# Patient Record
Sex: Female | Born: 1955
Health system: Southern US, Community
[De-identification: ages and names within clinical notes are randomized; demographics above are authoritative.]

## PROBLEM LIST (undated history)

## (undated) DIAGNOSIS — K529 Noninfective gastroenteritis and colitis, unspecified: Secondary | ICD-10-CM

## (undated) DIAGNOSIS — R946 Abnormal results of thyroid function studies: Secondary | ICD-10-CM

## (undated) DIAGNOSIS — R51 Headache: Secondary | ICD-10-CM

## (undated) DIAGNOSIS — B1710 Acute hepatitis C without hepatic coma: Secondary | ICD-10-CM

## (undated) DIAGNOSIS — G5600 Carpal tunnel syndrome, unspecified upper limb: Secondary | ICD-10-CM

## (undated) DIAGNOSIS — J189 Pneumonia, unspecified organism: Secondary | ICD-10-CM

## (undated) DIAGNOSIS — B370 Candidal stomatitis: Secondary | ICD-10-CM

## (undated) DIAGNOSIS — J449 Chronic obstructive pulmonary disease, unspecified: Secondary | ICD-10-CM

## (undated) DIAGNOSIS — M1990 Unspecified osteoarthritis, unspecified site: Secondary | ICD-10-CM

## (undated) DIAGNOSIS — K219 Gastro-esophageal reflux disease without esophagitis: Secondary | ICD-10-CM

## (undated) DIAGNOSIS — D649 Anemia, unspecified: Secondary | ICD-10-CM

## (undated) DIAGNOSIS — M7150 Other bursitis, not elsewhere classified, unspecified site: Secondary | ICD-10-CM

## (undated) DIAGNOSIS — M719 Bursopathy, unspecified: Secondary | ICD-10-CM

## (undated) DIAGNOSIS — K759 Inflammatory liver disease, unspecified: Secondary | ICD-10-CM

## (undated) DIAGNOSIS — J4489 Other specified chronic obstructive pulmonary disease: Secondary | ICD-10-CM

## (undated) DIAGNOSIS — L270 Generalized skin eruption due to drugs and medicaments taken internally: Secondary | ICD-10-CM

## (undated) DIAGNOSIS — R519 Headache, unspecified: Secondary | ICD-10-CM

## (undated) DIAGNOSIS — E782 Mixed hyperlipidemia: Secondary | ICD-10-CM

## (undated) DIAGNOSIS — A4902 Methicillin resistant Staphylococcus aureus infection, unspecified site: Secondary | ICD-10-CM

## (undated) DIAGNOSIS — I2510 Atherosclerotic heart disease of native coronary artery without angina pectoris: Secondary | ICD-10-CM

## (undated) DIAGNOSIS — M67919 Unspecified disorder of synovium and tendon, unspecified shoulder: Secondary | ICD-10-CM

## (undated) DIAGNOSIS — R29818 Other symptoms and signs involving the nervous system: Secondary | ICD-10-CM

## (undated) DIAGNOSIS — H60399 Other infective otitis externa, unspecified ear: Secondary | ICD-10-CM

## (undated) DIAGNOSIS — R079 Chest pain, unspecified: Secondary | ICD-10-CM

## (undated) DIAGNOSIS — R569 Unspecified convulsions: Secondary | ICD-10-CM

## (undated) DIAGNOSIS — T847XXA Infection and inflammatory reaction due to other internal orthopedic prosthetic devices, implants and grafts, initial encounter: Secondary | ICD-10-CM

## (undated) DIAGNOSIS — B182 Chronic viral hepatitis C: Principal | ICD-10-CM

## (undated) HISTORY — DX: Atherosclerotic heart disease of native coronary artery without angina pectoris: I25.10

## (undated) HISTORY — DX: Generalized skin eruption due to drugs and medicaments taken internally: L27.0

## (undated) HISTORY — DX: Candidal stomatitis: B37.0

## (undated) HISTORY — DX: Noninfective gastroenteritis and colitis, unspecified: K52.9

## (undated) HISTORY — DX: Other symptoms and signs involving the nervous system: R29.818

## (undated) HISTORY — PX: CARDIAC CATHETERIZATION: SHX172

## (undated) HISTORY — DX: Mixed hyperlipidemia: E78.2

## (undated) HISTORY — DX: Unspecified disorder of synovium and tendon, unspecified shoulder: M71.9

## (undated) HISTORY — DX: Methicillin resistant Staphylococcus aureus infection, unspecified site: A49.02

## (undated) HISTORY — DX: Infection and inflammatory reaction due to other internal orthopedic prosthetic devices, implants and grafts, initial encounter: T84.7XXA

## (undated) HISTORY — DX: Carpal tunnel syndrome, unspecified upper limb: G56.00

## (undated) HISTORY — DX: Other infective otitis externa, unspecified ear: H60.399

## (undated) HISTORY — DX: Unspecified disorder of synovium and tendon, unspecified shoulder: M67.919

## (undated) HISTORY — DX: Chest pain, unspecified: R07.9

## (undated) HISTORY — PX: TONSILLECTOMY: SUR1361

## (undated) HISTORY — PX: COLONOSCOPY: SHX174

## (undated) HISTORY — DX: Anemia, unspecified: D64.9

## (undated) HISTORY — PX: CHOLECYSTECTOMY: SHX55

## (undated) HISTORY — DX: Other specified chronic obstructive pulmonary disease: J44.89

## (undated) HISTORY — DX: Chronic viral hepatitis C: B18.2

## (undated) HISTORY — DX: Abnormal results of thyroid function studies: R94.6

## (undated) HISTORY — DX: Chronic obstructive pulmonary disease, unspecified: J44.9

## (undated) HISTORY — DX: Acute hepatitis C without hepatic coma: B17.10

## (undated) HISTORY — DX: Other bursitis, not elsewhere classified, unspecified site: M71.50

## (undated) HISTORY — PX: ABDOMINAL HYSTERECTOMY: SUR658

---
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
## 1997-10-14 HISTORY — PX: ABDOMINAL HYSTERECTOMY: SUR658

## 1998-03-25 ENCOUNTER — Emergency Department (HOSPITAL_COMMUNITY): Admission: EM | Admit: 1998-03-25 | Discharge: 1998-03-25 | Payer: Self-pay

## 1998-08-17 ENCOUNTER — Ambulatory Visit (HOSPITAL_COMMUNITY): Admission: RE | Admit: 1998-08-17 | Discharge: 1998-08-17 | Payer: Self-pay | Admitting: Obstetrics and Gynecology

## 1998-08-31 ENCOUNTER — Ambulatory Visit (HOSPITAL_COMMUNITY): Admission: RE | Admit: 1998-08-31 | Discharge: 1998-08-31 | Payer: Self-pay | Admitting: Obstetrics and Gynecology

## 1998-10-14 HISTORY — PX: CHOLECYSTECTOMY: SHX55

## 1998-11-08 ENCOUNTER — Other Ambulatory Visit: Admission: RE | Admit: 1998-11-08 | Discharge: 1998-11-08 | Payer: Self-pay | Admitting: Obstetrics and Gynecology

## 1998-11-21 ENCOUNTER — Emergency Department (HOSPITAL_COMMUNITY): Admission: EM | Admit: 1998-11-21 | Discharge: 1998-11-21 | Payer: Self-pay

## 1998-12-15 ENCOUNTER — Emergency Department (HOSPITAL_COMMUNITY): Admission: EM | Admit: 1998-12-15 | Discharge: 1998-12-15 | Payer: Self-pay | Admitting: Emergency Medicine

## 1999-01-24 ENCOUNTER — Encounter: Payer: Self-pay | Admitting: Obstetrics and Gynecology

## 1999-01-24 ENCOUNTER — Encounter: Payer: Self-pay | Admitting: Urology

## 1999-01-24 ENCOUNTER — Inpatient Hospital Stay (HOSPITAL_COMMUNITY): Admission: RE | Admit: 1999-01-24 | Discharge: 1999-01-27 | Payer: Self-pay | Admitting: Obstetrics and Gynecology

## 1999-01-28 ENCOUNTER — Emergency Department (HOSPITAL_COMMUNITY): Admission: EM | Admit: 1999-01-28 | Discharge: 1999-01-28 | Payer: Self-pay | Admitting: Emergency Medicine

## 1999-02-13 ENCOUNTER — Encounter: Payer: Self-pay | Admitting: Emergency Medicine

## 1999-02-13 ENCOUNTER — Encounter: Payer: Self-pay | Admitting: Urology

## 1999-02-14 ENCOUNTER — Inpatient Hospital Stay (HOSPITAL_COMMUNITY): Admission: EM | Admit: 1999-02-14 | Discharge: 1999-02-17 | Payer: Self-pay | Admitting: Emergency Medicine

## 1999-02-14 ENCOUNTER — Encounter: Payer: Self-pay | Admitting: Urology

## 1999-07-07 ENCOUNTER — Emergency Department (HOSPITAL_COMMUNITY): Admission: EM | Admit: 1999-07-07 | Discharge: 1999-07-08 | Payer: Self-pay | Admitting: *Deleted

## 2000-09-26 ENCOUNTER — Inpatient Hospital Stay (HOSPITAL_COMMUNITY): Admission: AD | Admit: 2000-09-26 | Discharge: 2000-09-26 | Payer: Self-pay | Admitting: Obstetrics and Gynecology

## 2000-12-16 ENCOUNTER — Emergency Department (HOSPITAL_COMMUNITY): Admission: EM | Admit: 2000-12-16 | Discharge: 2000-12-16 | Payer: Self-pay | Admitting: Emergency Medicine

## 2001-05-12 ENCOUNTER — Emergency Department (HOSPITAL_COMMUNITY): Admission: EM | Admit: 2001-05-12 | Discharge: 2001-05-13 | Payer: Self-pay | Admitting: Emergency Medicine

## 2002-05-19 ENCOUNTER — Emergency Department (HOSPITAL_COMMUNITY): Admission: EM | Admit: 2002-05-19 | Discharge: 2002-05-19 | Payer: Self-pay | Admitting: Emergency Medicine

## 2002-05-19 ENCOUNTER — Encounter: Payer: Self-pay | Admitting: Emergency Medicine

## 2003-07-24 ENCOUNTER — Emergency Department (HOSPITAL_COMMUNITY): Admission: AD | Admit: 2003-07-24 | Discharge: 2003-07-24 | Payer: Self-pay | Admitting: Family Medicine

## 2003-08-04 ENCOUNTER — Encounter: Payer: Self-pay | Admitting: Obstetrics

## 2003-08-04 ENCOUNTER — Ambulatory Visit (HOSPITAL_COMMUNITY): Admission: RE | Admit: 2003-08-04 | Discharge: 2003-08-04 | Payer: Self-pay | Admitting: Obstetrics

## 2003-08-23 ENCOUNTER — Ambulatory Visit (HOSPITAL_COMMUNITY): Admission: RE | Admit: 2003-08-23 | Discharge: 2003-08-23 | Payer: Self-pay | Admitting: Obstetrics

## 2003-09-23 ENCOUNTER — Encounter: Admission: RE | Admit: 2003-09-23 | Discharge: 2003-11-16 | Payer: Self-pay | Admitting: Orthopedic Surgery

## 2003-09-30 ENCOUNTER — Emergency Department (HOSPITAL_COMMUNITY): Admission: AD | Admit: 2003-09-30 | Discharge: 2003-10-01 | Payer: Self-pay | Admitting: Family Medicine

## 2003-10-11 ENCOUNTER — Emergency Department (HOSPITAL_COMMUNITY): Admission: AD | Admit: 2003-10-11 | Discharge: 2003-10-11 | Payer: Self-pay | Admitting: Family Medicine

## 2003-10-18 ENCOUNTER — Emergency Department (HOSPITAL_COMMUNITY): Admission: AD | Admit: 2003-10-18 | Discharge: 2003-10-18 | Payer: Self-pay | Admitting: Family Medicine

## 2003-11-12 ENCOUNTER — Emergency Department (HOSPITAL_COMMUNITY): Admission: AD | Admit: 2003-11-12 | Discharge: 2003-11-12 | Payer: Self-pay | Admitting: Family Medicine

## 2003-11-30 ENCOUNTER — Emergency Department (HOSPITAL_COMMUNITY): Admission: AD | Admit: 2003-11-30 | Discharge: 2003-11-30 | Payer: Self-pay | Admitting: Family Medicine

## 2004-05-15 ENCOUNTER — Emergency Department (HOSPITAL_COMMUNITY): Admission: EM | Admit: 2004-05-15 | Discharge: 2004-05-15 | Payer: Self-pay | Admitting: Emergency Medicine

## 2004-07-11 ENCOUNTER — Emergency Department (HOSPITAL_COMMUNITY): Admission: EM | Admit: 2004-07-11 | Discharge: 2004-07-11 | Payer: Self-pay | Admitting: Emergency Medicine

## 2004-07-22 ENCOUNTER — Emergency Department (HOSPITAL_COMMUNITY): Admission: EM | Admit: 2004-07-22 | Discharge: 2004-07-22 | Payer: Self-pay | Admitting: Emergency Medicine

## 2004-07-31 ENCOUNTER — Ambulatory Visit (HOSPITAL_COMMUNITY): Admission: RE | Admit: 2004-07-31 | Discharge: 2004-08-01 | Payer: Self-pay | Admitting: Surgery

## 2004-07-31 ENCOUNTER — Encounter (INDEPENDENT_AMBULATORY_CARE_PROVIDER_SITE_OTHER): Payer: Self-pay | Admitting: Specialist

## 2004-11-26 ENCOUNTER — Emergency Department (HOSPITAL_COMMUNITY): Admission: EM | Admit: 2004-11-26 | Discharge: 2004-11-26 | Payer: Self-pay | Admitting: Family Medicine

## 2004-12-02 ENCOUNTER — Emergency Department (HOSPITAL_COMMUNITY): Admission: EM | Admit: 2004-12-02 | Discharge: 2004-12-03 | Payer: Self-pay | Admitting: Emergency Medicine

## 2005-02-28 ENCOUNTER — Emergency Department (HOSPITAL_COMMUNITY): Admission: EM | Admit: 2005-02-28 | Discharge: 2005-02-28 | Payer: Self-pay | Admitting: Family Medicine

## 2005-07-31 ENCOUNTER — Emergency Department (HOSPITAL_COMMUNITY): Admission: EM | Admit: 2005-07-31 | Discharge: 2005-07-31 | Payer: Self-pay | Admitting: Emergency Medicine

## 2005-08-22 ENCOUNTER — Emergency Department (HOSPITAL_COMMUNITY): Admission: EM | Admit: 2005-08-22 | Discharge: 2005-08-22 | Payer: Self-pay | Admitting: Family Medicine

## 2005-10-12 ENCOUNTER — Emergency Department (HOSPITAL_COMMUNITY): Admission: EM | Admit: 2005-10-12 | Discharge: 2005-10-12 | Payer: Self-pay | Admitting: Family Medicine

## 2005-12-04 ENCOUNTER — Ambulatory Visit: Payer: Self-pay | Admitting: Gastroenterology

## 2005-12-05 ENCOUNTER — Ambulatory Visit: Payer: Self-pay | Admitting: Gastroenterology

## 2005-12-09 ENCOUNTER — Ambulatory Visit (HOSPITAL_COMMUNITY): Admission: RE | Admit: 2005-12-09 | Discharge: 2005-12-09 | Payer: Self-pay | Admitting: Obstetrics

## 2005-12-17 ENCOUNTER — Encounter: Admission: RE | Admit: 2005-12-17 | Discharge: 2005-12-30 | Payer: Self-pay | Admitting: Obstetrics

## 2005-12-20 ENCOUNTER — Emergency Department (HOSPITAL_COMMUNITY): Admission: EM | Admit: 2005-12-20 | Discharge: 2005-12-20 | Payer: Self-pay | Admitting: Family Medicine

## 2005-12-24 ENCOUNTER — Emergency Department (HOSPITAL_COMMUNITY): Admission: EM | Admit: 2005-12-24 | Discharge: 2005-12-24 | Payer: Self-pay | Admitting: Emergency Medicine

## 2006-01-22 ENCOUNTER — Emergency Department (HOSPITAL_COMMUNITY): Admission: EM | Admit: 2006-01-22 | Discharge: 2006-01-22 | Payer: Self-pay | Admitting: Emergency Medicine

## 2006-01-28 ENCOUNTER — Ambulatory Visit: Payer: Self-pay | Admitting: Gastroenterology

## 2006-02-12 ENCOUNTER — Encounter: Payer: Self-pay | Admitting: Obstetrics and Gynecology

## 2007-03-02 ENCOUNTER — Emergency Department (HOSPITAL_COMMUNITY): Admission: EM | Admit: 2007-03-02 | Discharge: 2007-03-02 | Payer: Self-pay | Admitting: Family Medicine

## 2007-04-01 ENCOUNTER — Ambulatory Visit: Payer: Self-pay | Admitting: Internal Medicine

## 2007-04-10 ENCOUNTER — Ambulatory Visit: Payer: Self-pay | Admitting: Internal Medicine

## 2007-04-10 DIAGNOSIS — G5600 Carpal tunnel syndrome, unspecified upper limb: Secondary | ICD-10-CM | POA: Insufficient documentation

## 2007-05-23 ENCOUNTER — Emergency Department (HOSPITAL_COMMUNITY): Admission: EM | Admit: 2007-05-23 | Discharge: 2007-05-23 | Payer: Self-pay | Admitting: Emergency Medicine

## 2007-05-26 ENCOUNTER — Ambulatory Visit: Payer: Self-pay | Admitting: Internal Medicine

## 2007-07-13 DIAGNOSIS — R29818 Other symptoms and signs involving the nervous system: Secondary | ICD-10-CM | POA: Insufficient documentation

## 2007-07-13 DIAGNOSIS — M7150 Other bursitis, not elsewhere classified, unspecified site: Secondary | ICD-10-CM | POA: Insufficient documentation

## 2007-07-20 ENCOUNTER — Encounter (INDEPENDENT_AMBULATORY_CARE_PROVIDER_SITE_OTHER): Payer: Self-pay | Admitting: Internal Medicine

## 2007-08-18 ENCOUNTER — Telehealth (INDEPENDENT_AMBULATORY_CARE_PROVIDER_SITE_OTHER): Payer: Self-pay | Admitting: *Deleted

## 2007-08-27 ENCOUNTER — Ambulatory Visit: Payer: Self-pay | Admitting: Internal Medicine

## 2007-08-27 DIAGNOSIS — R079 Chest pain, unspecified: Secondary | ICD-10-CM

## 2007-08-27 DIAGNOSIS — H60399 Other infective otitis externa, unspecified ear: Secondary | ICD-10-CM | POA: Insufficient documentation

## 2007-08-27 DIAGNOSIS — M67919 Unspecified disorder of synovium and tendon, unspecified shoulder: Secondary | ICD-10-CM | POA: Insufficient documentation

## 2007-08-27 DIAGNOSIS — M719 Bursopathy, unspecified: Secondary | ICD-10-CM

## 2007-08-30 ENCOUNTER — Encounter (INDEPENDENT_AMBULATORY_CARE_PROVIDER_SITE_OTHER): Payer: Self-pay | Admitting: Internal Medicine

## 2007-08-30 DIAGNOSIS — E782 Mixed hyperlipidemia: Secondary | ICD-10-CM

## 2007-08-30 LAB — CONVERTED CEMR LAB
Cholesterol: 249 mg/dL — ABNORMAL HIGH (ref 0–200)
HDL: 37 mg/dL — ABNORMAL LOW (ref >39)
Triglycerides: 242 mg/dL — ABNORMAL HIGH (ref <150)

## 2007-09-23 ENCOUNTER — Ambulatory Visit (HOSPITAL_COMMUNITY): Admission: RE | Admit: 2007-09-23 | Discharge: 2007-09-23 | Payer: Self-pay | Admitting: Obstetrics

## 2007-09-29 ENCOUNTER — Emergency Department (HOSPITAL_COMMUNITY): Admission: EM | Admit: 2007-09-29 | Discharge: 2007-09-29 | Payer: Self-pay | Admitting: Emergency Medicine

## 2008-04-16 ENCOUNTER — Emergency Department (HOSPITAL_COMMUNITY): Admission: EM | Admit: 2008-04-16 | Discharge: 2008-04-16 | Payer: Self-pay | Admitting: Emergency Medicine

## 2008-06-10 ENCOUNTER — Emergency Department (HOSPITAL_COMMUNITY): Admission: EM | Admit: 2008-06-10 | Discharge: 2008-06-10 | Payer: Self-pay | Admitting: Family Medicine

## 2008-07-13 ENCOUNTER — Inpatient Hospital Stay (HOSPITAL_COMMUNITY): Admission: EM | Admit: 2008-07-13 | Discharge: 2008-07-14 | Payer: Self-pay | Admitting: Emergency Medicine

## 2008-07-13 ENCOUNTER — Encounter (INDEPENDENT_AMBULATORY_CARE_PROVIDER_SITE_OTHER): Payer: Self-pay | Admitting: *Deleted

## 2008-07-28 ENCOUNTER — Ambulatory Visit: Payer: Self-pay | Admitting: Internal Medicine

## 2008-07-28 DIAGNOSIS — J449 Chronic obstructive pulmonary disease, unspecified: Secondary | ICD-10-CM

## 2008-07-28 DIAGNOSIS — J441 Chronic obstructive pulmonary disease with (acute) exacerbation: Secondary | ICD-10-CM | POA: Insufficient documentation

## 2008-07-28 DIAGNOSIS — B1710 Acute hepatitis C without hepatic coma: Secondary | ICD-10-CM | POA: Insufficient documentation

## 2008-07-28 DIAGNOSIS — J4489 Other specified chronic obstructive pulmonary disease: Secondary | ICD-10-CM | POA: Insufficient documentation

## 2008-07-31 ENCOUNTER — Telehealth (INDEPENDENT_AMBULATORY_CARE_PROVIDER_SITE_OTHER): Payer: Self-pay | Admitting: Internal Medicine

## 2008-08-02 ENCOUNTER — Encounter (INDEPENDENT_AMBULATORY_CARE_PROVIDER_SITE_OTHER): Payer: Self-pay | Admitting: Internal Medicine

## 2008-08-04 ENCOUNTER — Ambulatory Visit: Payer: Self-pay | Admitting: *Deleted

## 2008-08-09 ENCOUNTER — Ambulatory Visit: Payer: Self-pay | Admitting: Internal Medicine

## 2008-08-22 ENCOUNTER — Emergency Department (HOSPITAL_COMMUNITY): Admission: EM | Admit: 2008-08-22 | Discharge: 2008-08-22 | Payer: Self-pay | Admitting: Emergency Medicine

## 2008-08-29 LAB — CONVERTED CEMR LAB
ALT: 31 units/L (ref 0–35)
Albumin: 4.6 g/dL (ref 3.5–5.2)
Cholesterol: 168 mg/dL (ref 0–200)
HDL: 56 mg/dL (ref >39)
Indirect Bilirubin: 0.5 mg/dL (ref 0.0–0.9)
Total CHOL/HDL Ratio: 3
Total Protein: 7.6 g/dL (ref 6.0–8.3)
Triglycerides: 91 mg/dL (ref <150)
VLDL: 18 mg/dL (ref 0–40)

## 2008-10-04 ENCOUNTER — Ambulatory Visit: Payer: Self-pay | Admitting: Family Medicine

## 2008-10-04 DIAGNOSIS — M25559 Pain in unspecified hip: Secondary | ICD-10-CM | POA: Insufficient documentation

## 2008-10-19 ENCOUNTER — Emergency Department (HOSPITAL_COMMUNITY): Admission: EM | Admit: 2008-10-19 | Discharge: 2008-10-19 | Payer: Self-pay | Admitting: Emergency Medicine

## 2008-10-21 ENCOUNTER — Inpatient Hospital Stay (HOSPITAL_COMMUNITY): Admission: EM | Admit: 2008-10-21 | Discharge: 2008-10-24 | Payer: Self-pay | Admitting: Emergency Medicine

## 2008-11-26 ENCOUNTER — Emergency Department (HOSPITAL_COMMUNITY): Admission: EM | Admit: 2008-11-26 | Discharge: 2008-11-26 | Payer: Self-pay | Admitting: Emergency Medicine

## 2008-11-27 ENCOUNTER — Emergency Department (HOSPITAL_COMMUNITY): Admission: EM | Admit: 2008-11-27 | Discharge: 2008-11-27 | Payer: Self-pay | Admitting: Emergency Medicine

## 2008-12-24 ENCOUNTER — Emergency Department (HOSPITAL_COMMUNITY): Admission: EM | Admit: 2008-12-24 | Discharge: 2008-12-24 | Payer: Self-pay | Admitting: Emergency Medicine

## 2009-01-02 ENCOUNTER — Telehealth (INDEPENDENT_AMBULATORY_CARE_PROVIDER_SITE_OTHER): Payer: Self-pay | Admitting: Internal Medicine

## 2009-01-04 ENCOUNTER — Ambulatory Visit: Payer: Self-pay | Admitting: Internal Medicine

## 2009-01-06 ENCOUNTER — Ambulatory Visit (HOSPITAL_COMMUNITY): Admission: RE | Admit: 2009-01-06 | Discharge: 2009-01-06 | Payer: Self-pay | Admitting: Internal Medicine

## 2009-01-12 ENCOUNTER — Ambulatory Visit: Payer: Self-pay | Admitting: Internal Medicine

## 2009-01-12 DIAGNOSIS — G454 Transient global amnesia: Secondary | ICD-10-CM | POA: Insufficient documentation

## 2009-01-15 ENCOUNTER — Emergency Department (HOSPITAL_COMMUNITY): Admission: EM | Admit: 2009-01-15 | Discharge: 2009-01-15 | Payer: Self-pay | Admitting: Emergency Medicine

## 2009-03-18 ENCOUNTER — Emergency Department (HOSPITAL_COMMUNITY): Admission: EM | Admit: 2009-03-18 | Discharge: 2009-03-18 | Payer: Self-pay | Admitting: Emergency Medicine

## 2009-03-21 ENCOUNTER — Emergency Department (HOSPITAL_COMMUNITY): Admission: EM | Admit: 2009-03-21 | Discharge: 2009-03-21 | Payer: Self-pay | Admitting: Emergency Medicine

## 2009-03-22 ENCOUNTER — Inpatient Hospital Stay (HOSPITAL_COMMUNITY): Admission: EM | Admit: 2009-03-22 | Discharge: 2009-03-25 | Payer: Self-pay | Admitting: Emergency Medicine

## 2009-06-20 ENCOUNTER — Emergency Department (HOSPITAL_COMMUNITY): Admission: EM | Admit: 2009-06-20 | Discharge: 2009-06-20 | Payer: Self-pay | Admitting: Emergency Medicine

## 2009-06-22 ENCOUNTER — Emergency Department (HOSPITAL_COMMUNITY): Admission: EM | Admit: 2009-06-22 | Discharge: 2009-06-22 | Payer: Self-pay | Admitting: Emergency Medicine

## 2009-07-06 ENCOUNTER — Emergency Department (HOSPITAL_COMMUNITY): Admission: EM | Admit: 2009-07-06 | Discharge: 2009-07-06 | Payer: Self-pay | Admitting: Emergency Medicine

## 2009-07-12 ENCOUNTER — Encounter (INDEPENDENT_AMBULATORY_CARE_PROVIDER_SITE_OTHER): Payer: Self-pay | Admitting: Internal Medicine

## 2009-07-15 ENCOUNTER — Inpatient Hospital Stay (HOSPITAL_COMMUNITY): Admission: EM | Admit: 2009-07-15 | Discharge: 2009-07-17 | Payer: Self-pay | Admitting: Emergency Medicine

## 2009-07-24 ENCOUNTER — Emergency Department (HOSPITAL_COMMUNITY): Admission: EM | Admit: 2009-07-24 | Discharge: 2009-07-24 | Payer: Self-pay | Admitting: Emergency Medicine

## 2009-08-02 ENCOUNTER — Encounter: Payer: Self-pay | Admitting: Physician Assistant

## 2009-08-10 ENCOUNTER — Ambulatory Visit: Payer: Self-pay | Admitting: Physician Assistant

## 2009-08-10 DIAGNOSIS — H6990 Unspecified Eustachian tube disorder, unspecified ear: Secondary | ICD-10-CM | POA: Insufficient documentation

## 2009-08-10 DIAGNOSIS — J019 Acute sinusitis, unspecified: Secondary | ICD-10-CM | POA: Insufficient documentation

## 2009-08-10 DIAGNOSIS — H6980 Other specified disorders of Eustachian tube, unspecified ear: Secondary | ICD-10-CM | POA: Insufficient documentation

## 2009-10-02 IMAGING — CR DG CHEST 2V
2 series · 2 of 2 positions shown · non-contrast
Comparison: 03/18/2009

CLINICAL DATA: None of the and cough, fever, pneumonia.

CHEST - 2 VIEW

[w chest pa]
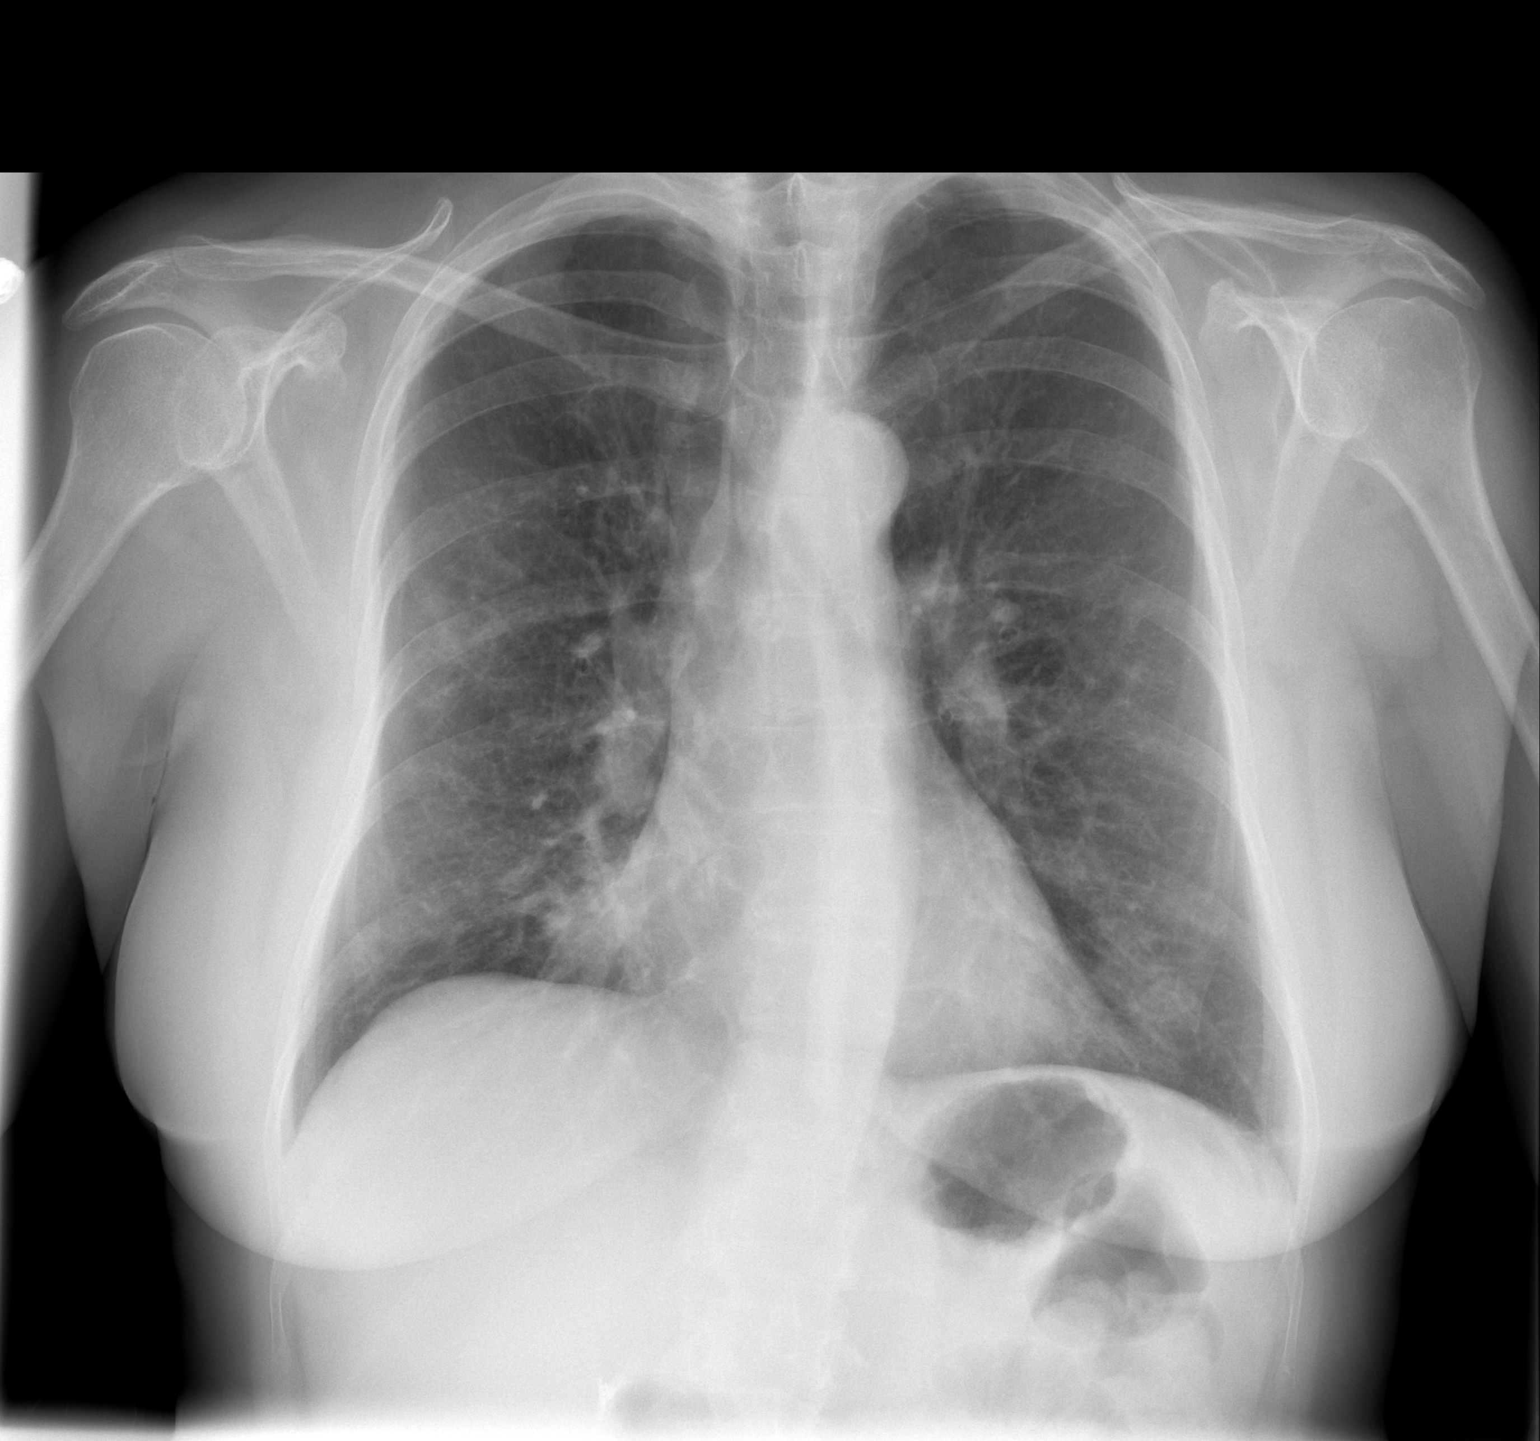

[w chest lat]
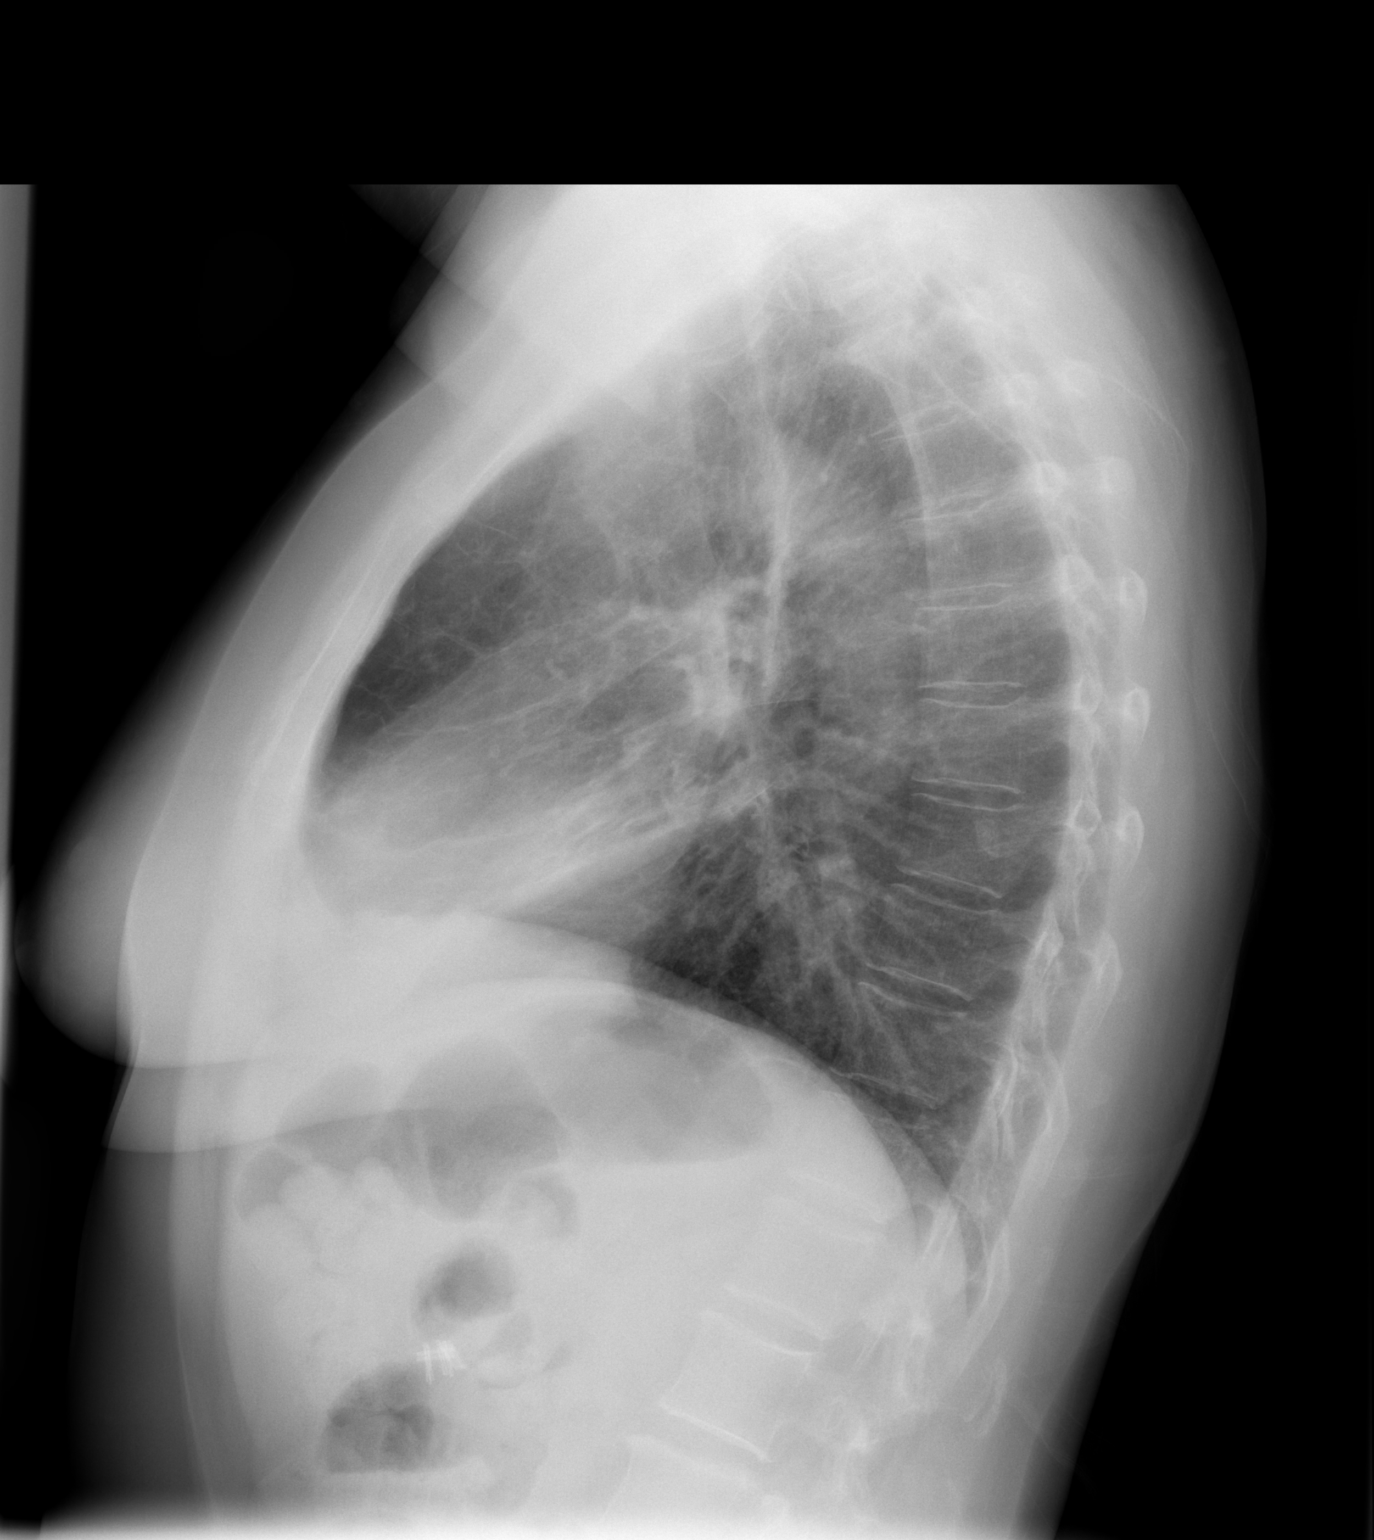

[2 of 2 positions shown; findings below may reference images not displayed]

FINDINGS: Patchy density peripherally in the right upper lobe, new
since prior study.  Question developing pneumonia.  Heart is normal
size.  No other focal opacities.  No effusions.  No acute bony
abnormality.
IMPRESSION: New patchy density in the peripheral right upper lobe.  Question
developing pneumonia.

## 2009-11-02 ENCOUNTER — Emergency Department (HOSPITAL_COMMUNITY): Admission: EM | Admit: 2009-11-02 | Discharge: 2009-11-02 | Payer: Self-pay | Admitting: Emergency Medicine

## 2009-11-13 ENCOUNTER — Emergency Department (HOSPITAL_COMMUNITY): Admission: EM | Admit: 2009-11-13 | Discharge: 2009-11-14 | Payer: Self-pay | Admitting: Emergency Medicine

## 2010-02-05 ENCOUNTER — Emergency Department (HOSPITAL_COMMUNITY): Admission: EM | Admit: 2010-02-05 | Discharge: 2010-02-05 | Payer: Self-pay | Admitting: Emergency Medicine

## 2010-06-14 ENCOUNTER — Encounter (HOSPITAL_COMMUNITY): Admission: RE | Admit: 2010-06-14 | Discharge: 2010-07-06 | Payer: Self-pay | Admitting: Cardiovascular Disease

## 2010-11-04 ENCOUNTER — Encounter: Payer: Self-pay | Admitting: Gastroenterology

## 2010-12-30 LAB — CBC
HCT: 41.9 % (ref 36.0–46.0)
Hemoglobin: 14.1 g/dL (ref 12.0–15.0)
MCHC: 33.6 g/dL (ref 30.0–36.0)
MCHC: 33.7 g/dL (ref 30.0–36.0)
MCV: 99.3 fL (ref 78.0–100.0)
RBC: 4.15 MIL/uL (ref 3.87–5.11)
RDW: 13.8 % (ref 11.5–15.5)
WBC: 8.4 10*3/uL (ref 4.0–10.5)

## 2010-12-30 LAB — POCT I-STAT, CHEM 8
Calcium, Ion: 1.1 mmol/L — ABNORMAL LOW (ref 1.12–1.32)
Chloride: 113 mEq/L — ABNORMAL HIGH (ref 96–112)
Glucose, Bld: 82 mg/dL (ref 70–99)
HCT: 42 % (ref 36.0–46.0)
HCT: 43 % (ref 36.0–46.0)
Potassium: 5 mEq/L (ref 3.5–5.1)
TCO2: 24 mmol/L (ref 0–100)

## 2010-12-30 LAB — POCT CARDIAC MARKERS
CKMB, poc: <1 ng/mL — ABNORMAL LOW (ref 1.0–8.0)
Troponin i, poc: <0.05 ng/mL (ref 0.00–0.09)

## 2010-12-30 LAB — CK TOTAL AND CKMB (NOT AT ARMC)
CK, MB: 0.7 ng/mL (ref 0.3–4.0)
Total CK: 11 U/L (ref 7–177)

## 2010-12-30 LAB — DIFFERENTIAL
Basophils Absolute: 0 10*3/uL (ref 0.0–0.1)
Basophils Absolute: 0.1 10*3/uL (ref 0.0–0.1)
Basophils Relative: 1 % (ref 0–1)
Basophils Relative: 1 % (ref 0–1)
Eosinophils Relative: 2 % (ref 0–5)
Monocytes Absolute: 0.5 10*3/uL (ref 0.1–1.0)
Monocytes Relative: 7 % (ref 3–12)
Neutro Abs: 5 10*3/uL (ref 1.7–7.7)
Neutrophils Relative %: 59 % (ref 43–77)

## 2011-01-01 LAB — CBC
MCHC: 34.2 g/dL (ref 30.0–36.0)
RBC: 4.43 MIL/uL (ref 3.87–5.11)
WBC: 8.9 10*3/uL (ref 4.0–10.5)

## 2011-01-01 LAB — DIFFERENTIAL
Basophils Relative: 1 % (ref 0–1)
Lymphs Abs: 2.5 10*3/uL (ref 0.7–4.0)
Monocytes Relative: 6 % (ref 3–12)
Neutro Abs: 5.7 10*3/uL (ref 1.7–7.7)
Neutrophils Relative %: 64 % (ref 43–77)

## 2011-01-01 LAB — POCT I-STAT, CHEM 8
Chloride: 110 mEq/L (ref 96–112)
Glucose, Bld: 95 mg/dL (ref 70–99)
HCT: 48 % — ABNORMAL HIGH (ref 36.0–46.0)
Potassium: 3.7 mEq/L (ref 3.5–5.1)
Sodium: 142 mEq/L (ref 135–145)

## 2011-01-01 LAB — POCT CARDIAC MARKERS
CKMB, poc: <1 ng/mL — ABNORMAL LOW (ref 1.0–8.0)
Myoglobin, poc: 38.4 ng/mL (ref 12–200)
Troponin i, poc: <0.05 ng/mL (ref 0.00–0.09)

## 2011-01-17 LAB — DIFFERENTIAL
Basophils Relative: 1 % (ref 0–1)
Eosinophils Absolute: 0.2 10*3/uL (ref 0.0–0.7)
Eosinophils Relative: 2 % (ref 0–5)
Neutrophils Relative %: 44 % (ref 43–77)

## 2011-01-17 LAB — COMPREHENSIVE METABOLIC PANEL
AST: 31 U/L (ref 0–37)
Albumin: 3.7 g/dL (ref 3.5–5.2)
Alkaline Phosphatase: 76 U/L (ref 39–117)
BUN: 11 mg/dL (ref 6–23)
CO2: 27 mEq/L (ref 19–32)
Chloride: 108 mEq/L (ref 96–112)
Creatinine, Ser: 0.92 mg/dL (ref 0.4–1.2)
GFR calc non Af Amer: >60 mL/min (ref >60)
Potassium: 5.3 mEq/L — ABNORMAL HIGH (ref 3.5–5.1)
Total Bilirubin: 0.4 mg/dL (ref 0.3–1.2)

## 2011-01-17 LAB — BASIC METABOLIC PANEL
BUN: 12 mg/dL (ref 6–23)
Calcium: 8.8 mg/dL (ref 8.4–10.5)
Creatinine, Ser: 0.83 mg/dL (ref 0.4–1.2)
GFR calc non Af Amer: >60 mL/min (ref >60)
Potassium: 4 mEq/L (ref 3.5–5.1)

## 2011-01-17 LAB — HEPARIN LEVEL (UNFRACTIONATED): Heparin Unfractionated: 0.59 IU/mL (ref 0.30–0.70)

## 2011-01-17 LAB — CARDIAC PANEL(CRET KIN+CKTOT+MB+TROPI)
CK, MB: 0.8 ng/mL (ref 0.3–4.0)
Relative Index: INVALID (ref 0.0–2.5)
Total CK: 87 U/L (ref 7–177)
Troponin I: 0.01 ng/mL (ref 0.00–0.06)

## 2011-01-17 LAB — LIPID PANEL
HDL: 46 mg/dL (ref >39)
Triglycerides: 108 mg/dL (ref <150)
VLDL: 22 mg/dL (ref 0–40)

## 2011-01-17 LAB — CBC
Platelets: 337 10*3/uL (ref 150–400)
RDW: 14.2 % (ref 11.5–15.5)
WBC: 8.4 10*3/uL (ref 4.0–10.5)

## 2011-01-17 LAB — TROPONIN I: Troponin I: 0.01 ng/mL (ref 0.00–0.06)

## 2011-01-17 LAB — CK TOTAL AND CKMB (NOT AT ARMC)
CK, MB: 1.3 ng/mL (ref 0.3–4.0)
Relative Index: 1 (ref 0.0–2.5)
Total CK: 124 U/L (ref 7–177)

## 2011-01-17 LAB — PROTIME-INR: INR: 0.9 (ref 0.00–1.49)

## 2011-01-17 LAB — POCT CARDIAC MARKERS

## 2011-01-17 LAB — APTT: aPTT: 25 seconds (ref 24–37)

## 2011-01-18 ENCOUNTER — Encounter (HOSPITAL_COMMUNITY): Payer: Self-pay | Admitting: Radiology

## 2011-01-18 ENCOUNTER — Emergency Department (HOSPITAL_COMMUNITY)
Admission: EM | Admit: 2011-01-18 | Discharge: 2011-01-18 | Disposition: A | Discharge status: Home / Self Care | Payer: Self-pay | Attending: Emergency Medicine | Admitting: Emergency Medicine

## 2011-01-18 ENCOUNTER — Emergency Department (HOSPITAL_COMMUNITY): Payer: Self-pay

## 2011-01-18 DIAGNOSIS — R071 Chest pain on breathing: Secondary | ICD-10-CM | POA: Insufficient documentation

## 2011-01-18 DIAGNOSIS — G8929 Other chronic pain: Secondary | ICD-10-CM | POA: Insufficient documentation

## 2011-01-18 DIAGNOSIS — K219 Gastro-esophageal reflux disease without esophagitis: Secondary | ICD-10-CM | POA: Insufficient documentation

## 2011-01-18 DIAGNOSIS — I10 Essential (primary) hypertension: Secondary | ICD-10-CM | POA: Insufficient documentation

## 2011-01-18 DIAGNOSIS — I2510 Atherosclerotic heart disease of native coronary artery without angina pectoris: Secondary | ICD-10-CM | POA: Insufficient documentation

## 2011-01-18 DIAGNOSIS — R112 Nausea with vomiting, unspecified: Secondary | ICD-10-CM | POA: Insufficient documentation

## 2011-01-18 DIAGNOSIS — Z8619 Personal history of other infectious and parasitic diseases: Secondary | ICD-10-CM | POA: Insufficient documentation

## 2011-01-18 DIAGNOSIS — Z79899 Other long term (current) drug therapy: Secondary | ICD-10-CM | POA: Insufficient documentation

## 2011-01-18 DIAGNOSIS — R1031 Right lower quadrant pain: Secondary | ICD-10-CM | POA: Insufficient documentation

## 2011-01-18 LAB — CBC
HCT: 46 % (ref 36.0–46.0)
Hemoglobin: 15.9 g/dL — ABNORMAL HIGH (ref 12.0–15.0)
MCV: 97.9 fL (ref 78.0–100.0)
RBC: 4.7 MIL/uL (ref 3.87–5.11)
RDW: 14.1 % (ref 11.5–15.5)
WBC: 9.6 10*3/uL (ref 4.0–10.5)

## 2011-01-18 LAB — URINALYSIS, ROUTINE W REFLEX MICROSCOPIC
Bilirubin Urine: NEGATIVE
Ketones, ur: NEGATIVE mg/dL
Nitrite: NEGATIVE
Protein, ur: NEGATIVE mg/dL
pH: 5.5 (ref 5.0–8.0)

## 2011-01-18 LAB — LIPASE, BLOOD: Lipase: 29 U/L (ref 11–59)

## 2011-01-18 LAB — COMPREHENSIVE METABOLIC PANEL
ALT: 25 U/L (ref 0–35)
Alkaline Phosphatase: 70 U/L (ref 39–117)
BUN: 13 mg/dL (ref 6–23)
CO2: 26 mEq/L (ref 19–32)
Chloride: 108 mEq/L (ref 96–112)
Glucose, Bld: 110 mg/dL — ABNORMAL HIGH (ref 70–99)
Potassium: 3.9 mEq/L (ref 3.5–5.1)
Sodium: 141 mEq/L (ref 135–145)
Total Bilirubin: 0.3 mg/dL (ref 0.3–1.2)
Total Protein: 7.5 g/dL (ref 6.0–8.3)

## 2011-01-18 LAB — DIFFERENTIAL
Basophils Absolute: 0 10*3/uL (ref 0.0–0.1)
Lymphocytes Relative: 30 % (ref 12–46)
Lymphs Abs: 2.9 10*3/uL (ref 0.7–4.0)
Neutro Abs: 6.1 10*3/uL (ref 1.7–7.7)

## 2011-01-18 LAB — URINE MICROSCOPIC-ADD ON

## 2011-01-18 MED ORDER — IOHEXOL 300 MG/ML  SOLN
100.0000 mL | Freq: Once | INTRAMUSCULAR | Status: AC | PRN
  Administered 2011-01-18: 100 mL via INTRAVENOUS

## 2011-01-21 LAB — COMPREHENSIVE METABOLIC PANEL
ALT: 48 U/L — ABNORMAL HIGH (ref 0–35)
AST: 39 U/L — ABNORMAL HIGH (ref 0–37)
Albumin: 2.9 g/dL — ABNORMAL LOW (ref 3.5–5.2)
Albumin: 3.2 g/dL — ABNORMAL LOW (ref 3.5–5.2)
Alkaline Phosphatase: 150 U/L — ABNORMAL HIGH (ref 39–117)
Alkaline Phosphatase: 200 U/L — ABNORMAL HIGH (ref 39–117)
BUN: 7 mg/dL (ref 6–23)
Calcium: 8.6 mg/dL (ref 8.4–10.5)
Calcium: 8.9 mg/dL (ref 8.4–10.5)
Creatinine, Ser: 0.78 mg/dL (ref 0.4–1.2)
GFR calc Af Amer: >60 mL/min (ref >60)
Potassium: 3.8 mEq/L (ref 3.5–5.1)
Potassium: 3.9 mEq/L (ref 3.5–5.1)
Sodium: 144 mEq/L (ref 135–145)
Total Protein: 6.4 g/dL (ref 6.0–8.3)
Total Protein: 7 g/dL (ref 6.0–8.3)

## 2011-01-21 LAB — HEPATIC FUNCTION PANEL
ALT: 34 U/L (ref 0–35)
Alkaline Phosphatase: 116 U/L (ref 39–117)
Bilirubin, Direct: 0.1 mg/dL (ref 0.0–0.3)
Indirect Bilirubin: 0.1 mg/dL — ABNORMAL LOW (ref 0.3–0.9)
Total Bilirubin: 0.2 mg/dL — ABNORMAL LOW (ref 0.3–1.2)

## 2011-01-21 LAB — DIFFERENTIAL
Lymphocytes Relative: 15 % (ref 12–46)
Lymphs Abs: 1.4 10*3/uL (ref 0.7–4.0)
Monocytes Absolute: 0.9 10*3/uL (ref 0.1–1.0)
Monocytes Relative: 10 % (ref 3–12)
Neutro Abs: 6.5 10*3/uL (ref 1.7–7.7)
Neutrophils Relative %: 72 % (ref 43–77)

## 2011-01-21 LAB — BLOOD GAS, ARTERIAL
Acid-base deficit: 2.5 mmol/L — ABNORMAL HIGH (ref 0.0–2.0)
Bicarbonate: 21.6 mEq/L (ref 20.0–24.0)
FIO2: 0.21 %
O2 Saturation: 95.9 %
pCO2 arterial: 35.6 mmHg (ref 35.0–45.0)
pO2, Arterial: 77.9 mmHg — ABNORMAL LOW (ref 80.0–100.0)

## 2011-01-21 LAB — CBC
HCT: 36.5 % (ref 36.0–46.0)
HCT: 41.6 % (ref 36.0–46.0)
Hemoglobin: 12.3 g/dL (ref 12.0–15.0)
MCHC: 33 g/dL (ref 30.0–36.0)
MCHC: 33.8 g/dL (ref 30.0–36.0)
Platelets: 332 10*3/uL (ref 150–400)
RBC: 3.77 MIL/uL — ABNORMAL LOW (ref 3.87–5.11)
RDW: 14.9 % (ref 11.5–15.5)
RDW: 15 % (ref 11.5–15.5)

## 2011-01-21 LAB — BASIC METABOLIC PANEL
CO2: 25 mEq/L (ref 19–32)
Calcium: 8.7 mg/dL (ref 8.4–10.5)
GFR calc Af Amer: >60 mL/min (ref >60)
Glucose, Bld: 145 mg/dL — ABNORMAL HIGH (ref 70–99)
Potassium: 4.2 mEq/L (ref 3.5–5.1)
Sodium: 142 mEq/L (ref 135–145)

## 2011-01-21 LAB — CULTURE, BLOOD (ROUTINE X 2)
Culture: NO GROWTH
Culture: NO GROWTH

## 2011-01-21 LAB — HCV RNA QUANT: HCV Quantitative: 1610000 IU/mL — ABNORMAL HIGH (ref <43)

## 2011-01-21 LAB — D-DIMER, QUANTITATIVE: D-Dimer, Quant: 0.27 ug/mL-FEU (ref 0.00–0.48)

## 2011-01-27 ENCOUNTER — Emergency Department (HOSPITAL_COMMUNITY)
Admission: EM | Admit: 2011-01-27 | Discharge: 2011-01-27 | Disposition: A | Discharge status: Home / Self Care | Payer: Self-pay | Attending: Emergency Medicine | Admitting: Emergency Medicine

## 2011-01-27 ENCOUNTER — Emergency Department (HOSPITAL_COMMUNITY): Payer: Self-pay

## 2011-01-27 DIAGNOSIS — Z8619 Personal history of other infectious and parasitic diseases: Secondary | ICD-10-CM | POA: Insufficient documentation

## 2011-01-27 DIAGNOSIS — Z79899 Other long term (current) drug therapy: Secondary | ICD-10-CM | POA: Insufficient documentation

## 2011-01-27 DIAGNOSIS — R197 Diarrhea, unspecified: Secondary | ICD-10-CM | POA: Insufficient documentation

## 2011-01-27 DIAGNOSIS — G8929 Other chronic pain: Secondary | ICD-10-CM | POA: Insufficient documentation

## 2011-01-27 DIAGNOSIS — R509 Fever, unspecified: Secondary | ICD-10-CM | POA: Insufficient documentation

## 2011-01-27 DIAGNOSIS — K219 Gastro-esophageal reflux disease without esophagitis: Secondary | ICD-10-CM | POA: Insufficient documentation

## 2011-01-27 DIAGNOSIS — I10 Essential (primary) hypertension: Secondary | ICD-10-CM | POA: Insufficient documentation

## 2011-01-27 DIAGNOSIS — R112 Nausea with vomiting, unspecified: Secondary | ICD-10-CM | POA: Insufficient documentation

## 2011-01-27 DIAGNOSIS — I2510 Atherosclerotic heart disease of native coronary artery without angina pectoris: Secondary | ICD-10-CM | POA: Insufficient documentation

## 2011-01-27 DIAGNOSIS — R1031 Right lower quadrant pain: Secondary | ICD-10-CM | POA: Insufficient documentation

## 2011-01-27 DIAGNOSIS — K5289 Other specified noninfective gastroenteritis and colitis: Secondary | ICD-10-CM | POA: Insufficient documentation

## 2011-01-27 LAB — URINALYSIS, ROUTINE W REFLEX MICROSCOPIC
Ketones, ur: 15 mg/dL — AB
Nitrite: NEGATIVE
Protein, ur: NEGATIVE mg/dL
Urobilinogen, UA: 1 mg/dL (ref 0.0–1.0)
pH: 5 (ref 5.0–8.0)

## 2011-01-27 LAB — CBC
HCT: 43.6 % (ref 36.0–46.0)
Hemoglobin: 14.6 g/dL (ref 12.0–15.0)
MCH: 32.7 pg (ref 26.0–34.0)
RBC: 4.47 MIL/uL (ref 3.87–5.11)

## 2011-01-27 LAB — DIFFERENTIAL
Basophils Relative: 1 % (ref 0–1)
Lymphocytes Relative: 45 % (ref 12–46)
Monocytes Relative: 7 % (ref 3–12)
Neutro Abs: 3.9 10*3/uL (ref 1.7–7.7)
Neutrophils Relative %: 46 % (ref 43–77)

## 2011-01-27 LAB — POCT I-STAT, CHEM 8
BUN: 30 mg/dL — ABNORMAL HIGH (ref 6–23)
Creatinine, Ser: 0.9 mg/dL (ref 0.4–1.2)
Potassium: 5 mEq/L (ref 3.5–5.1)
Sodium: 135 mEq/L (ref 135–145)
TCO2: 22 mmol/L (ref 0–100)

## 2011-01-27 LAB — URINE MICROSCOPIC-ADD ON

## 2011-01-27 MED ORDER — IOHEXOL 300 MG/ML  SOLN
100.0000 mL | Freq: Once | INTRAMUSCULAR | Status: AC | PRN
  Administered 2011-01-27: 100 mL via INTRAVENOUS

## 2011-01-28 LAB — TROPONIN I: Troponin I: 0.01 ng/mL (ref 0.00–0.06)

## 2011-01-28 LAB — CBC
Hemoglobin: 12.6 g/dL (ref 12.0–15.0)
MCHC: 33 g/dL (ref 30.0–36.0)
MCHC: 33.4 g/dL (ref 30.0–36.0)
MCV: 95.9 fL (ref 78.0–100.0)
RBC: 3.16 MIL/uL — ABNORMAL LOW (ref 3.87–5.11)
RBC: 4 MIL/uL (ref 3.87–5.11)
RDW: 13.8 % (ref 11.5–15.5)

## 2011-01-28 LAB — COMPREHENSIVE METABOLIC PANEL
ALT: 46 U/L — ABNORMAL HIGH (ref 0–35)
AST: 68 U/L — ABNORMAL HIGH (ref 0–37)
Albumin: 2.5 g/dL — ABNORMAL LOW (ref 3.5–5.2)
CO2: 26 mEq/L (ref 19–32)
Calcium: 7.9 mg/dL — ABNORMAL LOW (ref 8.4–10.5)
Calcium: 9.1 mg/dL (ref 8.4–10.5)
Creatinine, Ser: 0.71 mg/dL (ref 0.4–1.2)
GFR calc Af Amer: >60 mL/min (ref >60)
GFR calc non Af Amer: >60 mL/min (ref >60)
Glucose, Bld: 106 mg/dL — ABNORMAL HIGH (ref 70–99)
Sodium: 143 mEq/L (ref 135–145)
Total Protein: 5.3 g/dL — ABNORMAL LOW (ref 6.0–8.3)

## 2011-01-28 LAB — BASIC METABOLIC PANEL
CO2: 23 mEq/L (ref 19–32)
Calcium: 7.9 mg/dL — ABNORMAL LOW (ref 8.4–10.5)
Creatinine, Ser: 0.73 mg/dL (ref 0.4–1.2)
GFR calc Af Amer: >60 mL/min (ref >60)

## 2011-01-28 LAB — URINALYSIS, ROUTINE W REFLEX MICROSCOPIC
Glucose, UA: NEGATIVE mg/dL
Ketones, ur: NEGATIVE mg/dL
Protein, ur: NEGATIVE mg/dL

## 2011-01-28 LAB — CULTURE, BLOOD (ROUTINE X 2)
Culture: NO GROWTH
Culture: NO GROWTH

## 2011-01-28 LAB — DIFFERENTIAL
Eosinophils Absolute: 0.1 10*3/uL (ref 0.0–0.7)
Lymphocytes Relative: 16 % (ref 12–46)
Lymphs Abs: 2.6 10*3/uL (ref 0.7–4.0)
Neutro Abs: 12.4 10*3/uL — ABNORMAL HIGH (ref 1.7–7.7)
Neutrophils Relative %: 75 % (ref 43–77)

## 2011-01-28 LAB — CK TOTAL AND CKMB (NOT AT ARMC)
Relative Index: INVALID (ref 0.0–2.5)
Relative Index: INVALID (ref 0.0–2.5)
Total CK: 73 U/L (ref 7–177)
Total CK: 88 U/L (ref 7–177)

## 2011-01-28 LAB — CARDIAC PANEL(CRET KIN+CKTOT+MB+TROPI)
Relative Index: INVALID (ref 0.0–2.5)
Troponin I: <0.01 ng/mL (ref 0.00–0.06)

## 2011-01-28 LAB — MAGNESIUM: Magnesium: 2.2 mg/dL (ref 1.5–2.5)

## 2011-01-28 LAB — APTT: aPTT: 34 seconds (ref 24–37)

## 2011-02-21 ENCOUNTER — Ambulatory Visit (INDEPENDENT_AMBULATORY_CARE_PROVIDER_SITE_OTHER): Payer: Self-pay

## 2011-02-21 ENCOUNTER — Ambulatory Visit (HOSPITAL_COMMUNITY): Payer: Self-pay

## 2011-02-21 ENCOUNTER — Inpatient Hospital Stay (INDEPENDENT_AMBULATORY_CARE_PROVIDER_SITE_OTHER)
Admission: RE | Admit: 2011-02-21 | Discharge: 2011-02-21 | Disposition: A | Discharge status: Home / Self Care | Payer: Self-pay | Source: Ambulatory Visit | Attending: Family Medicine | Admitting: Family Medicine

## 2011-02-21 DIAGNOSIS — S93609A Unspecified sprain of unspecified foot, initial encounter: Secondary | ICD-10-CM

## 2011-02-26 NOTE — Discharge Summary (Signed)
NAMESADY, MONACO                ACCOUNT NO.:  [id number]   MEDICAL RECORD NO.:  [id number]          PATIENT TYPE:  INP   LOCATION:  4732                         FACILITY:  [facility]   PHYSICIAN:  Monte Fantasia, MD  DATE OF BIRTH:  05/05/1956   DATE OF ADMISSION:  03/22/2009  DATE OF DISCHARGE:  03/25/2009                               DISCHARGE SUMMARY   ADDENDUM   Please refer to the prior discharge summary dictated on March 24, 2009.  The patient has a primary care physician.  The patient has an  appointment with [facility] with Dr. Fannie Knee Drinkard on March 29, 2009, at  11 a.m.   DISCHARGE DIAGNOSIS:  There has been no change in the discharge  diagnosis.   DISCHARGE MEDICATIONS:  1. Tessalon Perles 200 mg p.o. t.i.d.  2. Avelox 400 mg p.o. daily for 3 more days.  3. Guaifenesin 600 mg p.o. b.i.d.  4. Topamax 25 mg p.o. nightly for 5 days.  5. Wellbutrin 150 mg p.o. b.i.d.  6. Aspirin 81 mg p.o. daily.  7. Oxycodone 5 mg p.o. q.6 h. p.r.n. pain.  8. Prednisone 40 mg p.o. daily to taper by 10 mg every day to      discontinue.   HOSPITAL COURSE:  Please refer to the prior discharge summary dictation.  There has been no change in the prior discharge summary.  The patient at  present is medically stable to be discharged and can be discharged home.   LABORATORY DATA:  Labs done today, liver function test, total bilirubin  0.2, direct bilirubin 0.1, indirect 0.1, alkaline phosphatase 116, AST  25, ALT 34, total protein 5.7, and albumin 2.6.   DISPOSITION:  The patient at present is medically stable to be  discharged.  The patient has been aggressively counseled to stop smoking  and also to follow up with her primary care physician with [facility].      Monte Fantasia, MD  Electronically Signed     MP/MEDQ  D:  03/25/2009  T:  03/26/2009  Job:  [id number]

## 2011-02-26 NOTE — Discharge Summary (Signed)
[name], [name]                ACCOUNT NO.:  [id number]   MEDICAL RECORD NO.:  [id number]          PATIENT TYPE:  INP   LOCATION:  4732                         FACILITY:  [facility]   PHYSICIAN:  Monte Fantasia, MD  DATE OF BIRTH:  10-Jan-1956   DATE OF ADMISSION:  03/22/2009  DATE OF DISCHARGE:                               DISCHARGE SUMMARY   PRIMARY CARE PHYSICIAN:  Unassigned.  The patient follows up at  [facility], however, wants change of primary care physician.   DISCHARGE DIAGNOSES:  1. Right upper lobe pneumonia.  2. Chronic obstructive pulmonary disease exacerbation.  3. Tobacco abuse.  4. Hepatitis C.  5. Anxiety disorder.   DISCHARGE MEDICATIONS:  1. Tessalon 200 mg p.o. t.i.d.  2. Avelox 400 mg p.o. daily for 3 days.  3. Guaifenesin 600 mg p.o. b.i.d.  4. Topamax 25 mg p.o. nightly for 5 days.  5. Wellbutrin 150 mg p.o. b.i.d.  6. Aspirin 81 mg p.o. daily.   ALLERGIES:  CODEINE, ZITHROMAX, ROCEPHIN which apparently causes rashes.  As per the patient also is allergic to TRAMADOL as it worsens her  headaches.   COURSE DURING THE HOSPITAL STAY:  [name] is a 55 year old lady  patient who came in, was admitted with increasing shortness of breath,  and on admission, a chest x-ray showed right middle lobe pneumonia,  community acquired, hence the patient was started on Avelox.  Blood  cultures have been done, which have been no growth to date.  The patient  has a significant history of smoking; however, the patient in view of  history of smoking has been counseled to quit smoking.  Also during the  stay in the hospital, the patient was noted to have significant  expiratory wheezing and hence was started on IV steroids.  The patient  improved well with the medications.  At present, we would discontinue  the IV steroids and would start her on p.o. and would complete the  course of Avelox for a total of 7 days.  The patient has been  aggressively counseled for  stopping smoking.   The patient has been complaining of headaches; however, as per the  patient states that the headaches are controlled only with morphine.  The patient could not be given NSAIDs as the patient's liver enzymes on  admission were mildly elevated, hence the patient was started on  Topamax.  If the patient improves symptomatically, we would give Topamax  for 5 days for her headache.  More likely the chronic headaches are  secondary to depression.  The patient needs an outpatient followup with  psychiatrist for her depression.  At present, the patient does not have  any suicidal ideations or mood variations.  The patient's depression is  well controlled with Wellbutrin.   Elevated liver functions:  The patient had elevated liver functions on  the day of admission.  Zocor has been on hold and in view of the same.  The patient does have history of hepatitis C, also may be related to  NSAID use.  At present, the liver functions have been trending down  well.  Right upper quadrant ultrasound showed no evidence of liver  pathology.  We would monitor the same as an outpatient.   RADIOLOGICAL INVESTIGATIONS DONE DURING THE STAY IN THE HOSPITAL:  1. Chest x-ray done on March 22, 2009.  Impression:  New patchy density in peripheral right upper lobe,  questionable developing pneumonia.  1. Chest x-ray done on March 23, 2009.  Impression:  Stable mild patchy pneumonia in the right upper lobe,  relatively chronic right middle lobe atelectasis.  1. Right upper quadrant sonogram.  Impression:  Prior cholecystectomy.  Liver size and echotexture normal.  No focal abnormalities.  Pancreas, no focal abnormalities.  Spleen,  normal size and echotexture. No focal abnormalities.  Both the kidneys  normal size and echotexture.  No evidence of hydronephrosis.   LABORATORY DATA DONE PRIOR TO DISCHARGE:  Total WBC 7.8, hemoglobin  12.3, hematocrit 36.5, platelets of 298.  Sodium 144, potassium  3.8,  chloride 112, bicarb 22, glucose 158, BUN 9, creatinine 0.73.  Total  bilirubin 0.2, alkaline phosphatase 150, AST 39, ALT 48, total protein  6.4, albumin 2.9, calcium of 8.6.  Blood cultures x2 sets have been  negative.  No growth to date.   DISPOSITION:  The patient at present has recovered well with her COPD  exacerbation and pneumonia and would be transitioned to p.o. prednisone  steroids in a.m., and the patient would be medically stable to be  discharged in next 24 hours.  The patient needs to have a primary care  physician as an outpatient and has been informed regarding the same and  has agreed to do so.  Tobacco cessation counseling has been done.   TOTAL TIME FOR DISCHARGE:  40 minutes with 20-minute counseling.      Monte Fantasia, MD  Electronically Signed     MP/MEDQ  D:  03/24/2009  T:  03/25/2009  Job:  [phone]

## 2011-02-26 NOTE — H&P (Signed)
[name], [name]                ACCOUNT NO.:  [id number]   MEDICAL RECORD NO.:  [id number]          PATIENT TYPE:  INP   LOCATION:  3730                         FACILITY:  [facility]   PHYSICIAN:  Lucita Ferrara, MD         DATE OF BIRTH:  1956-06-21   DATE OF ADMISSION:  07/12/2008  DATE OF DISCHARGE:                              HISTORY & PHYSICAL   PRIMARY CARE DOCTOR:  Unassigned.   HISTORY OF PRESENT ILLNESS:  The patient is a 55 year old female with  chief complaint of chest pain located in the left anterior precordial  area, going on for a last 2 hours prior to arrival, but actually started  2 days ago and gradually and possibly getting worse.  Constant in  nature.  No alleviating worsening factors.  Not associated with cough,  food, pleuritis, fevers, chills.  However, she does say that when she  takes a deep breath it does get worse, and also upon movement and  exertion it is worse.  She has got some relative risk factors including  smoking.  She has no family history of premature or her coronary artery  disease.  Never undergone catheterization or cardiac evaluation in the  past.   PAST MEDICAL HISTORY:  1. Anxiety.  2. Looks like she has got multiple admissions for multiple medical      issues including chronic cough, falls, muscle ache and muscle      strain and unclear etiology, weakness and fatigue.   PAST SURGICAL HISTORY:  Status post laparoscopic cholecystectomy dated  July 31, 2004.  She has also documented history of hepatitis C and  anxiety as above.   ALLERGIES:  CODEINE.   MEDICATIONS:  She denies taking any medication.   REVIEW OF SYSTEMS:  As per HPI, otherwise negative.   PHYSICAL EXAMINATION:  GENERAL:  Generally speaking, patient is in no  acute distress.  VITAL SIGNS:  Blood pressure is 118/75, pulse 64, respirations 14,  temperature 98.1, pulse ox 98% on 2 liters nasal cannula.  HEENT:  Normocephalic, atraumatic.  Sclerae is anicteric.  PERRLA.   Extraocular  muscles intact.  NECK:  Supple.  No JVD.  No carotid bruits.  CARDIOVASCULAR:  S1, S2, regular rate and rhythm.  No murmurs, rubs,  clicks.  LUNGS: Clear to auscultation bilaterally.  No rhonchi, rales or wheezes.  ABDOMEN:  Soft, nontender, nondistended.  Positive bowel sounds.  NEUROLOGICAL:  Patient is alert, oriented x3.  Cranial nerves II-XII  grossly intact.  Strength 5/5 bilaterally upper and lower extremities.  Chest wall shows no point tenderness.   EMERGENCY ROOM COURSE:  The patient was given sublingual nitroglycerin  with no relief of her chest pain.  She was also given IV Dilaudid with  some relief.  Her EKG did show inverted T-waves in the inferior leads in  2, 3, AVF.  In discussion with the emergency room physician, he felt  that there was no need for acute Cardiology intervention given the  negative enzymes.  Her D-dimer is positive at 0.7.  Her CBC is white  count 12.2,  hemoglobin 13.8, hematocrit 41.8, platelet count 290.  First  set of cardiac markers troponin less than 0.05.  I-stat shows a  hemoglobin of 14.3, hematocrit of 42.  Sodium, potassium chloride,  glucose, BUN and creatinine within normal limits.  CT angiography showed  no pulmonary embolism, mild to moderate intra and extrahepatic biliary  duct dilatation status post cholecystectomy.  There is also left upper  lobe paraseptal emphysema.  In regards to intra- and extrahepatic  biliary dilatation, there is mild to moderate intra- and extrahepatic  biliary dilatation, unchanged from 2006.  It was felt that while this  degree of biliary ductal dilatation is unusual status post  cholecystectomy, it appears to be a chronic finding in this patient.  She has no complete metabolic panel.   ASSESSMENT/PLAN:  A 55 year old with;  1. Atypical chest pain.  2. Inferior EKG changes persistent on two EKGs here with negative      enzymes, however.  3. Positive tobacco abuse.  4. Positive D-dimer.   Negative CT angio for a pulmonary embolism.  5. Mild leukocytosis.   DISCUSSION AND PAN:  Will go ahead and admit the patient to medical  telemetry unit.  Will initiate ACS protocol.  Will cycle enzymes x3  every 8 hours.  Aspirin, beta blocker, nitroglycerin and Dilaudid for  pain.  Smoking cessation will proceed.  The patient will likely need a  stress test versus Cardiology evaluation prior to discharge given focal  inferior changes on her EKG.  Given that she has mild leukocytosis, will  need to get a urinalysis.  The patient has not however, had any fevers  or chills.  Her chest x-ray essentially does not show any focal  infiltration.  However, her CT angio did show some emphysematous changes  to be related to smoking.  Also, the patient has some biliary duct  dilatation on her CT angiography that could be secondary to normal  findings per Radiology.  Yet, given that she is having this chest  discomfort, must rule out biliary tract disease or CBD thickening.  Will  correlates with LFTs, lipase, amylase, alk phos and the rest of the  plans really depend on her progress.      Lucita Ferrara, MD  Electronically Signed     RR/MEDQ  D:  07/13/2008  T:  07/13/2008  Job:  [id number]

## 2011-02-26 NOTE — H&P (Signed)
NAMEDEB, LOUDIN                ACCOUNT NO.:  [id number]   MEDICAL RECORD NO.:  [id number]          PATIENT TYPE:  INP   LOCATION:  1827                         FACILITY:  [facility]   PHYSICIAN:  Lucita Ferrara, MD         DATE OF BIRTH:  April 04, 1956   DATE OF ADMISSION:  10/20/2008  DATE OF DISCHARGE:                              HISTORY & PHYSICAL   CHIEF COMPLAINT:  Dyspnea and shortness of breath.   This is a 55 year old female who presents with dyspnea.  Symptoms have  been ongoing now for 2-3 days.  The patient presented to the emergency  room with similar complaints yesterday and was sent home per report.  The patient has a history of known tobacco abuse, known chronic cough.  She had subjective fevers at home and here in the emergency room she was  found to be febrile at 100.9.  She denies any urinary frequency or  urgency.  She denies abdominal pain.   PAST MEDICAL HISTORY:  1. History of mild coronary artery disease.  2. Chronic tobacco abuse.  3. Anxiety disorder.  4. Hepatitis C.   PAST SURGICAL HISTORY:  1. Status post coronary artery bypass graft.  2. She is status post laparoscopic cholecystectomy in 2005.   SOCIAL HISTORY:  She is a current smoker.  She denies drugs or alcohol.   ALLERGIES:  CODEINE.   MEDICATIONS AT HOME:  1. Wellbutrin.  2. Aspirin.  3. Zithromax.   REVIEW OF SYSTEMS:  As per HPI, otherwise negative.   PHYSICAL EXAMINATION:  GENERAL:  The patient is in no acute distress.  VITAL SIGNS:  Blood pressure 109/65, pulse 105, respirations 20, T-max  100.9.  HEENT:  Normocephalic, atraumatic.  Sclerae anicteric.  PERRLA.  Extraocular muscles intact.  NECK:  Supple.  No JVD, no carotid bruits.  CARDIOVASCULAR:  S1-S2.  Regular rate and rhythm.  No murmurs, rubs or  clicks.  LUNGS:  Clear to auscultation bilaterally.  No rhonchi, rales or  wheezes.  ABDOMEN:  Soft, nontender, nondistended.  Positive bowel sounds.  EXTREMITIES:  No clubbing,  cyanosis or edema.   Chest x-ray shows a persistent right middle lobe infiltrate, mild volume  loss and peribronchial thickening.   LABORATORY DATA:  Complete metabolic panel, sodium, potassium,  electrolytes, AST, ALT, alk phos and albumin within normal limits.  CBC  shows leukocytosis of 16.9.  Hemoglobin/hematocrit within normal limits.  Platelet count mildly high at 423.  Neutrophils absolute 12.4.   ASSESSMENT:  1. The patient is a 55 year old female smoker who presents with a      right middle lobe pneumonia.  2. Tobacco abuse.  3. Documented history of coronary artery disease.   DISCUSSION AND PLAN:  The patient will be admitted to the medical  telemetry unit.  Oxygen 2 liters nasal cannula, nebulizer treatments  with albuterol and Atrovent.  The patient will be initiated on  intravenous antibiotics with Zithromax and Rocephin for community-  acquired pneumonia.  Blood cultures, UA, urine culture.  Tobacco  cessation literature and a nicotine patch will also be offered.  Will  continue aspirin and Wellbutrin per home dose.  Medications will be  confirmed.  Given her coronary artery disease, I will go ahead and cycle  her cardiac enzymes x3 every 8 hours and will put an EKG to the chart.  The rest of the plans will depend on her progress.      Lucita Ferrara, MD  Electronically Signed     RR/MEDQ  D:  10/20/2008  T:  10/21/2008  Job:  [id number]

## 2011-02-26 NOTE — H&P (Signed)
[name], [name]                ACCOUNT NO.:  [id number]   MEDICAL RECORD NO.:  [id number]          PATIENT TYPE:  INP   LOCATION:  1832                         FACILITY:  [facility]   PHYSICIAN:  Lonia Blood, M.D.DATE OF BIRTH:  [birth year]   DATE OF ADMISSION:  03/22/2009  DATE OF DISCHARGE:                              HISTORY & PHYSICAL   PRIMARY CARE PHYSICIAN:  Unassigned.   CHIEF COMPLAINT:  Shortness of breath.   PRESENT ILLNESS:  [name] is a 55 year old female with a  history of multiple admissions for multiple different issues.  She was  most recently admitted to the hospital in January of 2010 with what  proved to be a right middle lobe community-acquired pneumonia.  Since  her discharge she resumed smoking.  She does state that she quit smoking  approximately 5 days ago.  Nonetheless she reports with a 1-week history  of progressive severe shortness of breath.  She has been coughing  frequently.  There has been thick sputum production.  The patient has  had subjective fevers and chills.  She has noted expiratory wheezing.  Her symptoms have progressively worsened over the last 7 days.  Last  night she could not sleep because she had such difficulty with shortness  of breath.  She has had no chest pain.  There has been no nausea or  vomiting, diarrhea, constipation.  There has been no weight loss or gain  that she has appreciated.   REVIEW OF SYSTEMS:  A comprehensive review of systems is unremarkable  with exception of the positive elements noted in the history of present  illness above.   PAST MEDICAL HISTORY:  1. Right middle lobe community-acquired pneumonia January 2010.  2. Tobacco abuse - discontinued approximately 3 days ago.  3. Mild nonobstructive coronary disease.      a.     Status post cath October 2009.      b.     30% stenosis proximal RCA and 30% stenosis LAD.  4. Ongoing tobacco abuse - claims recently quit.  5. Hepatitis C.  6.  Anxiety disorder.  7. Status post laparoscopic cholecystectomy October 2005.   OUTPATIENT MEDICATIONS:  1. Wellbutrin 150 mg b.i.d.  2. Aspirin 81 mg daily.  3. Zocor 40 mg nightly.   ALLERGIES:  CODEINE, ZITHROMAX AND ROCEPHIN WHICH APPARENTLY CAUSES  RASHES.   FAMILY HISTORY:  Noncontributory this admission.   SOCIAL HISTORY:  The patient does not drink alcohol.  She does not use  drugs.  She lives with her daughter in [place].   DATA REVIEWED:  White count is 9.0, platelet count is normal, hemoglobin  is normal, MCV is normal.  Sodium, potassium, chloride, bicarb, BUN,  creatinine are normal.  Serum glucose is normal.  AST is 149, ALT is 70,  alk phos is 200 with normal total bili.  Albumin is 3.2.  D-dimer is  negative.  Point of care markers are negative.  Chest x-ray reveals  patchy density in the right upper lobe, concerning for a possible early  pneumonia.   PHYSICAL EXAMINATION:  Temperature  101.6, blood pressure 125/79, heart  rate 113, respiratory rate 22, O2 saturation 93% on 6 liters per minute.  She I will come back to th  GENERAL:  Well-developed, well-nourished female in mild to moderate  respiratory distress.  HEENT:  Normocephalic, atraumatic.  Pupils equal, round, react to light  and accommodation.  Extraocular musclesintact bilaterally.  OC OP clear.  NECK:  No JVD.  No lymphadenopathy.  LUNGS:  Mild expiratory wheeze which does end before the end of  expiration with no severe prolonged expiratory phase at the present time  with no focal crackles.   DICTATION ENDED HERE.      Lonia Blood, M.D.  Electronically Signed     JTM/MEDQ  D:  03/22/2009  T:  03/22/2009  Job:  [id number]

## 2011-02-26 NOTE — Discharge Summary (Signed)
NAMEBREALYN, BARIL                ACCOUNT NO.:  [id number]   MEDICAL RECORD NO.:  [id number]           PATIENT TYPE:   LOCATION:                                 FACILITY:   PHYSICIAN:  Monte Fantasia, MD       DATE OF BIRTH:   DATE OF ADMISSION:  DATE OF DISCHARGE:                               DISCHARGE SUMMARY   ADDENDUM  The patient at present is symptomatically better and is stable to be  discharged.  The patient can be discharged home.  Clinical status is  improving.  No change sin the discharge summary dictated prior on  October 23, 2008.  The patient can be discharged.  Plan to discharge  home today.      Monte Fantasia, MD  Electronically Signed     MP/MEDQ  D:  10/24/2008  T:  10/24/2008  Job:  [id number]

## 2011-02-26 NOTE — Discharge Summary (Signed)
NAMECHRISTABELL, LOSEKE                ACCOUNT NO.:  [id number]   MEDICAL RECORD NO.:  [id number]          PATIENT TYPE:  INP   LOCATION:  3730                         FACILITY:  [facility]   PHYSICIAN:  Lonia Blood, M.D.       DATE OF BIRTH:  03-10-1956   DATE OF ADMISSION:  07/12/2008  DATE OF DISCHARGE:  07/14/2008                               DISCHARGE SUMMARY   DISCHARGE DIAGNOSES:  1. Mild coronary artery disease.  2. Tobacco abuse.  3. Anxiety disorder.   DISCHARGE MEDICATIONS:  1. Aspirin 81 mg daily.  2. Zocor 40 mg at bedtime.  3. Tylenol as needed for pain.  4. Nicotine patch 40 mg daily.   CONSULTATION DURING THIS ADMISSION:  The patient was seen in  consultation by Dr. Algie Coffer.   PROCEDURES DURING THIS ADMISSION:  1. July 13, 2008, the patient underwent a transthoracic      echocardiogram, findings of normal ejection fraction, mild LVH.  2. July 14, 2008, left heart catheterization, findings of a proximal      RCA of 30% stenosis and LAD 30% stenosis.   HISTORY AND PHYSICAL:  Refer to dictated H&P done by Dr. Flonnie Overman.   CONDITION ON DISCHARGE:  Ms. Varnadore is discharged in good condition.  She  will follow up with [facility] and Dr. Algie Coffer.   HOSPITAL COURSE:  Ms. Duffey is a 55 year old woman with multiple vague  complaints was admitted via the emergency room with some chest pain.  She did have EKG changes of negative T-waves in II, III and aVF raising  concern of unstable angina.  The patient was taken to the Cardiac  Catheterization Lab and had coronary angiogram with the presence of mild  nonobstructive coronary artery disease on the LAD and RCA.  Ms. Swetz  was discharged  home with aspirin, statin and close followup with Dr. Algie Coffer and with  her primary care physician at [facility].  At the time  of discharge, the patient's right groin was in stable condition without  hematoma or bleeding.      Lonia Blood, M.D.  Electronically Signed     SL/MEDQ  D:  07/14/2008  T:  07/15/2008  Job:  [id number]   cc:   Blinda Leatherwood, M.D.

## 2011-02-26 NOTE — H&P (Signed)
[name], DERN                ACCOUNT NO.:  [id number]   MEDICAL RECORD NO.:  [id number]          PATIENT TYPE:  INP   LOCATION:  1832                         FACILITY:  [facility]   PHYSICIAN:  Lonia Blood, M.D.DATE OF BIRTH:  Mar 13, 1956   DATE OF ADMISSION:  03/22/2009  DATE OF DISCHARGE:                              HISTORY & PHYSICAL   ADDENDUM:  Please note this is a continuation/addendum to the previously  dictated initial portion of the patient's history and physical.  The  dictation system simply cut me off inexplicably as I approached the  latter portion of my previous dictation.   PHYSICAL EXAM:  CARDIOVASCULAR - Tachycardic but regular without gallop  or rub with no appreciable murmur.  ABDOMEN - Overweight, soft bowel sounds present.  No organomegaly,  rebound or ascites.  EXTREMITIES:  There is no significant cyanosis, clubbing, edema  bilateral lower extremities.  NEUROLOGIC - Alert and oriented x4.  Cranial II-XII intact bilaterally  and 5/5 strength bilateral upper and lower extremities.  Intact  sensation to touch throughout.   IMPRESSION AND PLAN:  1. Right upper lobe community-acquired pneumonia - I have explained to      the patient that her recurrent episodes of pulmonary infection are      likely due to her ongoing tobacco abuse.  I have advised her to      completely avoid tobacco products of all kinds.  The patient will      be admitted to the acute units.  She will be gently hydrated.  She      will be placed on Avelox given her recent questionable allergic      reaction to Rocephin and azithromycin.  We will add a symptomatic      control with Tessalon Perles and Robitussin and Humibid.  2. Mild acute chronic obstructive pulmonary disease exacerbation - The      patient indeed displaying a significant agree of expiratory wheeze.      At this time the patient is not in severe distress but given her      concomitant infection, I am concerned that  this could rapidly      progress.  We will place the patient on scheduled nebs.  It would      be wise at the time of discharge to transition the patient to a      long-term inhaler such as Advair for better outpatient control.      Again I have counseled the patient as to the link between this      disease process and her smoking and advised her to continue in her      full abstinence from tobacco products.  3. Hepatitis C - The extent of the patient's previous evaluation or      consideration for treatment is not clear at this time.  We will      need to follow this up during the patient's hospital stay.  We will      recheck LFTs in the morning with gentle hydration.  I will check  coags as well.  4. Tobacco abuse - as noted above.  The patient has been counseled      extensively during this interaction to assure that she understands      that she must discontinue tobacco abuse.  I will request tobacco      cessation consultation during the patient's hospital stay.  5. Anxiety disorder - we will continue the patient's usual outpatient      Wellbutrin.  6. Hyperlipidemia - we will continue the patient's Zocor.  If the      patient's LFTs trend upward, we may need to consider holding this      medication.     Lonia Blood, M.D.  Electronically Signed    JTM/MEDQ  D:  03/22/2009  T:  03/22/2009  Job:  [id number]

## 2011-02-26 NOTE — Discharge Summary (Signed)
[name], YANKEE                ACCOUNT NO.:  [id number]   MEDICAL RECORD NO.:  [id number]          PATIENT TYPE:  INP   LOCATION:  3002                         FACILITY:  [facility]   PHYSICIAN:  Monte Fantasia, MD  DATE OF BIRTH:  1956-06-07   DATE OF ADMISSION:  10/20/2008  DATE OF DISCHARGE:                               DISCHARGE SUMMARY   PRIMARY CARE PHYSICIAN:  Unassigned.   DISCHARGE MEDICATIONS:  1. Avelox 500 mg p.o. q.12 h. for 7 days.  2. Aspirin 81 mg p.o. daily.  3. Guaifenesin 600 mg p.o. b.i.d.  4. Albuterol 2 puffs inhalations q.6 h. p.r.n. breathlessness.  5. Protonix 40 mg p.o. q.12 h.   DISCHARGE DIAGNOSES:  1. Right middle lobe pneumonia.  2. Tobacco abuse.  3. History of coronary artery disease.   COURSE DURING THE HOSPITAL STAY:  The patient is a 55 year old Caucasian  lady.  The patient was admitted on October 21, 2008, with complaints of  shortness of breath and dyspnea.  The patient was having subjective  fevers at home, found to be febrile of 100.9 in the emergency.  The  patient was initially started with Rocephin and Zithromax, however, had  a skin rash and the antibiotics were discontinued and changed to Avelox.  The patient was hypoxic on admission, however, has improved well with  the IV antibiotics and IV fluids.  Blood cultures since admission have  been no growth to date and leukocytosis has resolved.  The patient is  stable to be discharged and can be discharged home today.   LABORATORIES UPON DISCHARGE:  Total WBC 7.2, hemoglobin 10.2, hematocrit  30.6, and platelet count 311.  Sodium 136, potassium 3.7, chloride 107,  bicarb 23, glucose 97, BUN 2, and creatinine 0.73.  Cardiac enzymes x3  has been negative.  TSH 0.0789.  UA has been negative.  Blood cultures  x2 sets on October 21, 2008, has been no growth to date.   PHYSICAL EXAMINATION:  VITAL SIGNS:  Today, temperature of 97.6; pulse  of 70; respirations 18; blood pressure 100/61,  previous have been 120  and 124/74; and oxygen saturation is 94% on room air.  HEENT:  Neck is supple.  Pupils are equal and reacting to light.  No  pallor or no lymphadenopathy.  RESPIRATORY:  Air entry is bilaterally equal.  Right lower base  crackles.  ABDOMEN:  Soft.  No guarding or rigidity.  No distention.  No  tenderness.  EXTREMITIES:  No edema feet.   DISPOSITION:  The patient is stable to be discharged home on p.o.  medications.  He is recommended to follow up with primary care physician  as an outpatient.      Monte Fantasia, MD  Electronically Signed     MP/MEDQ  D:  10/23/2008  T:  10/23/2008  Job:  [id number]

## 2011-03-01 NOTE — Op Note (Signed)
[name], [name]                ACCOUNT NO.:  [id number]   MEDICAL RECORD NO.:  [id number]          PATIENT TYPE:  OIB   LOCATION:  2899                         FACILITY:  [facility]   PHYSICIAN:  Velora Heckler, MD      DATE OF BIRTH:  07/18/1956   DATE OF PROCEDURE:  07/31/2004  DATE OF DISCHARGE:                                 OPERATIVE REPORT   PREOPERATIVE DIAGNOSIS:  Symptomatic cholelithiasis.   POSTOPERATIVE DIAGNOSIS:  Symptomatic cholelithiasis.   PROCEDURE:  Laparoscopic cholecystectomy with intraoperative  cholangiography.   SURGEON:  Velora Heckler, M.D.   ANESTHESIA:  General anesthesia.   ESTIMATED BLOOD LOSS:  Minimal.   PREPARATION:  Betadine.   COMPLICATIONS:  None.   INDICATIONS FOR PROCEDURE:  The patient is a 55 year old white female with  several episodes of intermittent epigastric abdominal pain radiating to the  right upper quadrant.  The patient has had an ultrasound demonstrating  multiple gallstones.  Laboratory studies have been within normal limits.  The patient now comes to surgery for cholecystectomy.   DESCRIPTION OF PROCEDURE:  The procedure was done in OR #16 at the [facility].  The patient was brought to the operating room and  placed in the supine position on the operating room table.  Following  administration of general anesthesia, the patient was prepped and draped in  the usual strict aseptic fashion.  After ascertaining that an adequate level  of anesthesia had been obtained, an infraumbilical incision was made with  #15 blade.  Dissection was carried down to the fascia.  The fascia was  incised in the midline and the peritoneal cavity is entered cautiously.  A 0  Vicryl pursestring suture was placed in the fascia.  Hasson cannula was  introduced and the abdomen was insufflated with carbon dioxide.  Laparoscope  was introduced and the abdomen was explored.  Operative ports were placed  along the right costal  margin in the midline, mid clavicular line and  anterior axillary line.  Fundus of the gallbladder was grasped and retracted  cephalad.  Dissection was begun at the neck of the gallbladder.  Peritoneum  was incised.  Neck of the gallbladder and proximal cystic duct was dissected  out.  Clip was placed at the neck of the gallbladder.  Cystic duct was  incised with the Endoshears.  Cook cholangiography catheter was introduced  through a stab wound in the right upper quadrant.  It was inserted into the  cystic duct and secured with a Ligaclip.  Using C-arm fluoroscopy, real time  cholangiography was performed.  There was rapid filling of a long cystic  duct into a normal caliber common bile duct.  There was free flow of  contrast distally into the duodenum without filling defect or obstruction.  There was reflux of contrast into the right and left hepatic ductal systems.  Clips was withdrawn and the [facility] catheter was removed from the peritoneal  cavity.  The cystic duct was triply clipped and divided.  The cystic artery  was doubly clipped and divided.  Posterior  branch of cystic artery was  dissected out, doubly clipped, and divided. Gallbladder was then excised  from the gallbladder bed using a hook electrocautery for hemostasis.  Gallbladder was placed into an EndoCatch bag and extracted through the  umbilical port without difficulty.  Right upper quadrant is irrigated with  warm saline.  Good hemostasis is noted in the gallbladder bed.  Fluid is  evacuated.  Ports are removed under direct vision and good hemostasis was  noted at the port sites.  Pneumoperitoneum was released.  A 0 Vicryl  pursestring suture was tied securely.  Port sites were anesthetized with  local anesthetic.  All wounds were closed with interrupted 4-0 Vicryl  subcuticular sutures.  All wounds were washed and dried and Benzoin and  Steri-Strips applied.  Sterile dressings were applied.  The patient was  awakened from  anesthesia and brought to the recovery room in stable  condition.  The patient tolerated the procedure well.      Todd   TMG/MEDQ  D:  07/31/2004  T:  07/31/2004  Job:  [id number]

## 2011-06-15 ENCOUNTER — Emergency Department (HOSPITAL_COMMUNITY)
Admission: EM | Admit: 2011-06-15 | Discharge: 2011-06-16 | Disposition: A | Discharge status: Home / Self Care | Payer: Self-pay | Attending: Emergency Medicine | Admitting: Emergency Medicine

## 2011-06-15 DIAGNOSIS — M545 Low back pain, unspecified: Secondary | ICD-10-CM | POA: Insufficient documentation

## 2011-06-15 DIAGNOSIS — R5381 Other malaise: Secondary | ICD-10-CM | POA: Insufficient documentation

## 2011-06-15 DIAGNOSIS — I2510 Atherosclerotic heart disease of native coronary artery without angina pectoris: Secondary | ICD-10-CM | POA: Insufficient documentation

## 2011-06-15 DIAGNOSIS — K219 Gastro-esophageal reflux disease without esophagitis: Secondary | ICD-10-CM | POA: Insufficient documentation

## 2011-06-15 DIAGNOSIS — B9789 Other viral agents as the cause of diseases classified elsewhere: Secondary | ICD-10-CM | POA: Insufficient documentation

## 2011-06-15 DIAGNOSIS — M546 Pain in thoracic spine: Secondary | ICD-10-CM | POA: Insufficient documentation

## 2011-06-15 DIAGNOSIS — R51 Headache: Secondary | ICD-10-CM | POA: Insufficient documentation

## 2011-06-15 DIAGNOSIS — IMO0001 Reserved for inherently not codable concepts without codable children: Secondary | ICD-10-CM | POA: Insufficient documentation

## 2011-06-15 DIAGNOSIS — R1110 Vomiting, unspecified: Secondary | ICD-10-CM | POA: Insufficient documentation

## 2011-06-15 DIAGNOSIS — M542 Cervicalgia: Secondary | ICD-10-CM | POA: Insufficient documentation

## 2011-06-15 DIAGNOSIS — I10 Essential (primary) hypertension: Secondary | ICD-10-CM | POA: Insufficient documentation

## 2011-06-15 DIAGNOSIS — R10819 Abdominal tenderness, unspecified site: Secondary | ICD-10-CM | POA: Insufficient documentation

## 2011-06-15 DIAGNOSIS — F411 Generalized anxiety disorder: Secondary | ICD-10-CM | POA: Insufficient documentation

## 2011-06-15 DIAGNOSIS — H6690 Otitis media, unspecified, unspecified ear: Secondary | ICD-10-CM | POA: Insufficient documentation

## 2011-06-15 DIAGNOSIS — R197 Diarrhea, unspecified: Secondary | ICD-10-CM | POA: Insufficient documentation

## 2011-06-15 DIAGNOSIS — Z8619 Personal history of other infectious and parasitic diseases: Secondary | ICD-10-CM | POA: Insufficient documentation

## 2011-06-15 DIAGNOSIS — Z79899 Other long term (current) drug therapy: Secondary | ICD-10-CM | POA: Insufficient documentation

## 2011-06-15 DIAGNOSIS — M2550 Pain in unspecified joint: Secondary | ICD-10-CM | POA: Insufficient documentation

## 2011-06-15 LAB — CBC
MCV: 97 fL (ref 78.0–100.0)
Platelets: 426 10*3/uL — ABNORMAL HIGH (ref 150–400)
RBC: 3.94 MIL/uL (ref 3.87–5.11)
WBC: 8.1 10*3/uL (ref 4.0–10.5)

## 2011-06-15 LAB — URINALYSIS, ROUTINE W REFLEX MICROSCOPIC
Glucose, UA: NEGATIVE mg/dL
Leukocytes, UA: NEGATIVE
Nitrite: NEGATIVE
pH: 7 (ref 5.0–8.0)

## 2011-06-15 LAB — BASIC METABOLIC PANEL
CO2: 26 mEq/L (ref 19–32)
Chloride: 103 mEq/L (ref 96–112)
Potassium: 4 mEq/L (ref 3.5–5.1)
Sodium: 138 mEq/L (ref 135–145)

## 2011-06-15 LAB — HEPATIC FUNCTION PANEL
AST: 19 U/L (ref 0–37)
Albumin: 3.3 g/dL — ABNORMAL LOW (ref 3.5–5.2)
Total Protein: 6 g/dL (ref 6.0–8.3)

## 2011-06-15 LAB — POCT I-STAT TROPONIN I: Troponin i, poc: 0 ng/mL (ref 0.00–0.08)

## 2011-07-11 LAB — DIFFERENTIAL
Basophils Relative: 1
Eosinophils Absolute: 0.2
Monocytes Absolute: 0.6
Monocytes Relative: 7
Neutro Abs: 5

## 2011-07-11 LAB — CBC
Hemoglobin: 13.9
MCHC: 33.7
MCV: 95.7
RBC: 4.3

## 2011-07-11 LAB — POCT I-STAT, CHEM 8
Calcium, Ion: 1.09 — ABNORMAL LOW
Chloride: 105
Glucose, Bld: 99
HCT: 43

## 2011-07-15 LAB — COMPREHENSIVE METABOLIC PANEL
ALT: 30
Alkaline Phosphatase: 94
BUN: 12
CO2: 23
Chloride: 106
GFR calc non Af Amer: >60
Glucose, Bld: 91
Potassium: 3.6
Total Bilirubin: 0.6

## 2011-07-15 LAB — TSH: TSH: 5.131 — ABNORMAL HIGH

## 2011-07-15 LAB — POCT CARDIAC MARKERS: Troponin i, poc: <0.05

## 2011-07-15 LAB — CBC
HCT: 41.3
MCHC: 33.5
Platelets: 290
RDW: 13.6

## 2011-07-15 LAB — POCT I-STAT, CHEM 8
Calcium, Ion: 1.13
Glucose, Bld: 103 — ABNORMAL HIGH
HCT: 42
Hemoglobin: 14.3
Potassium: 4
TCO2: 24

## 2011-07-15 LAB — PROTIME-INR
INR: 1
Prothrombin Time: 13

## 2011-07-15 LAB — TROPONIN I: Troponin I: <0.01

## 2011-07-15 LAB — LIPASE, BLOOD: Lipase: 23

## 2011-07-15 LAB — CARDIAC PANEL(CRET KIN+CKTOT+MB+TROPI)
CK, MB: 1.6
Total CK: 122

## 2011-07-15 LAB — APTT: aPTT: 27

## 2011-07-15 LAB — CALCIUM: Calcium: 8.5

## 2011-07-15 LAB — B-NATRIURETIC PEPTIDE (CONVERTED LAB): Pro B Natriuretic peptide (BNP): 81

## 2011-07-16 LAB — DIFFERENTIAL
Basophils Absolute: 0.1
Basophils Relative: 1
Lymphocytes Relative: 33
Monocytes Absolute: 0.4
Neutro Abs: 4.1
Neutrophils Relative %: 58

## 2011-07-16 LAB — POCT I-STAT, CHEM 8
BUN: 7
Calcium, Ion: 1.13
Chloride: 106
HCT: 44
Potassium: 3.8

## 2011-07-16 LAB — CBC
Hemoglobin: 13.9
RBC: 4.32
RDW: 13.8

## 2011-07-16 LAB — CK TOTAL AND CKMB (NOT AT ARMC): CK, MB: 0.8

## 2011-07-16 LAB — TROPONIN I: Troponin I: <0.01

## 2011-09-03 IMAGING — CR DG FOOT COMPLETE 3+V*L*
3 series · 3 of 3 positions shown · non-contrast
Comparison: None

CLINICAL DATA: Fell.  Left foot pain.

LEFT FOOT - COMPLETE 3+ VIEW

[view not recorded (1 of 3)]
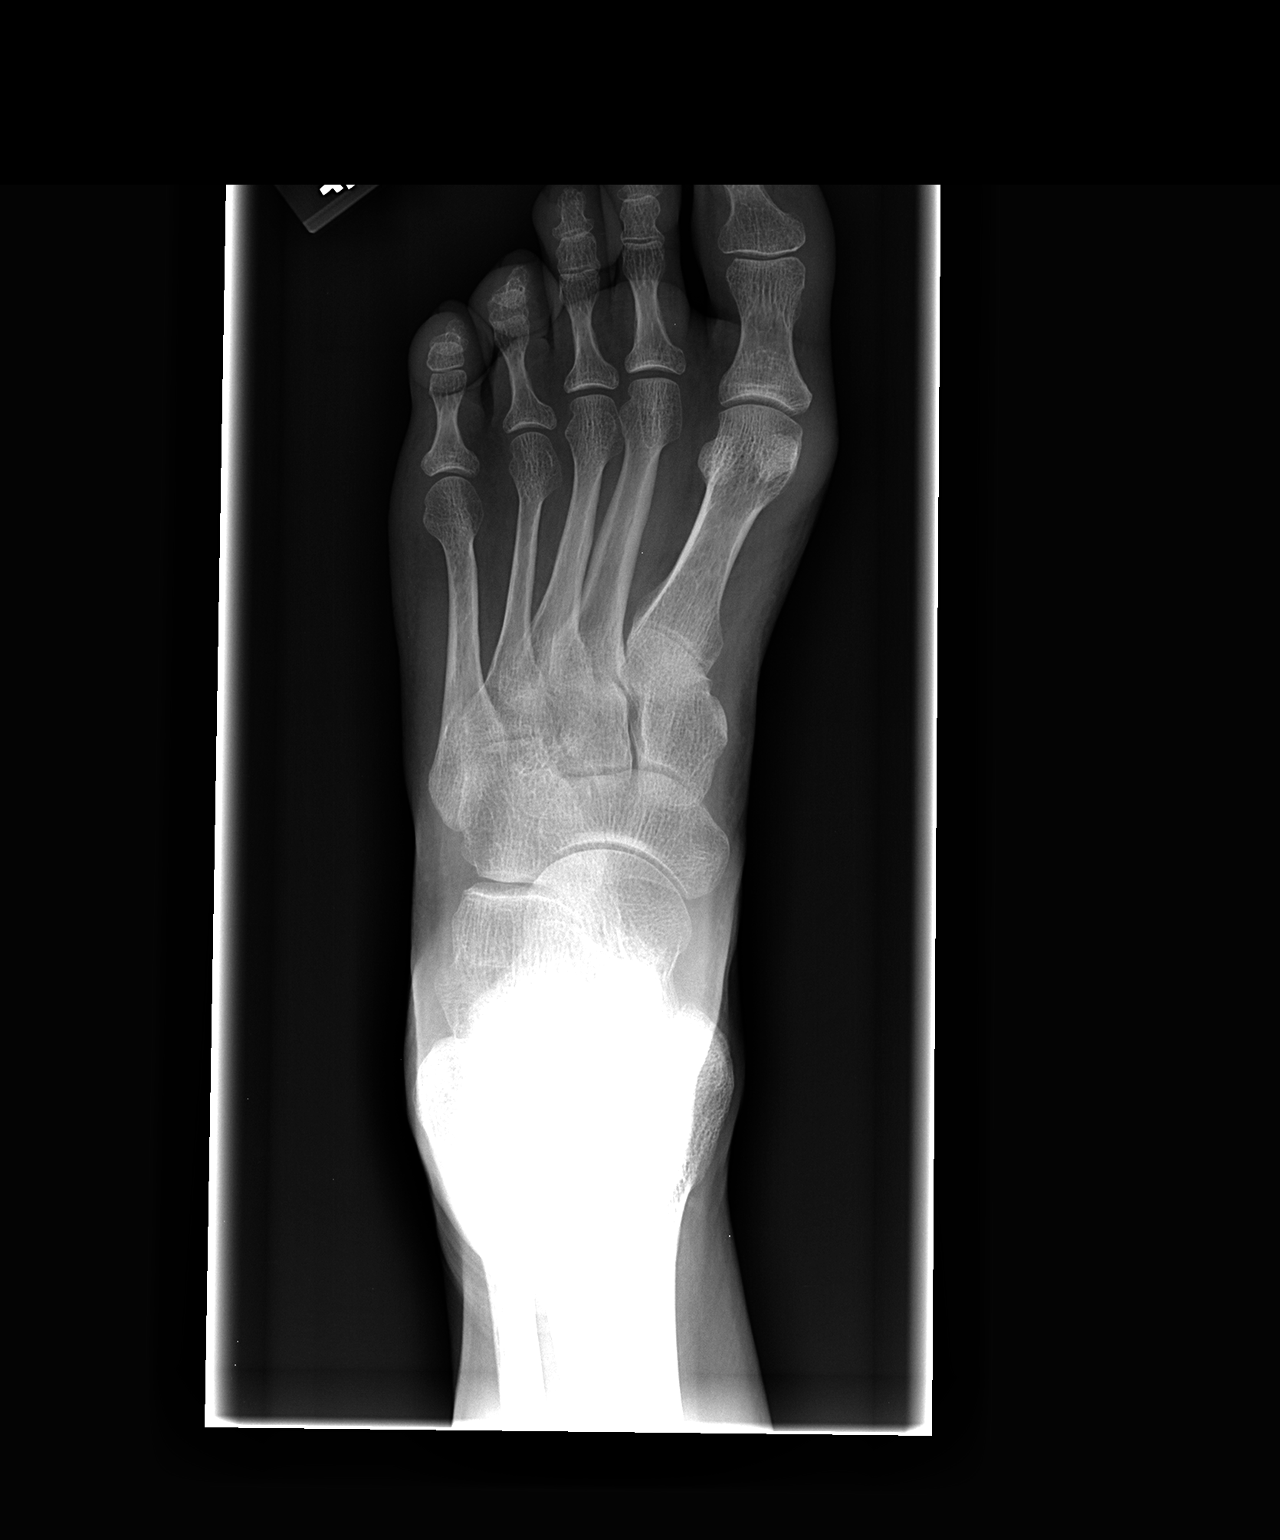

[view not recorded (2 of 3)]
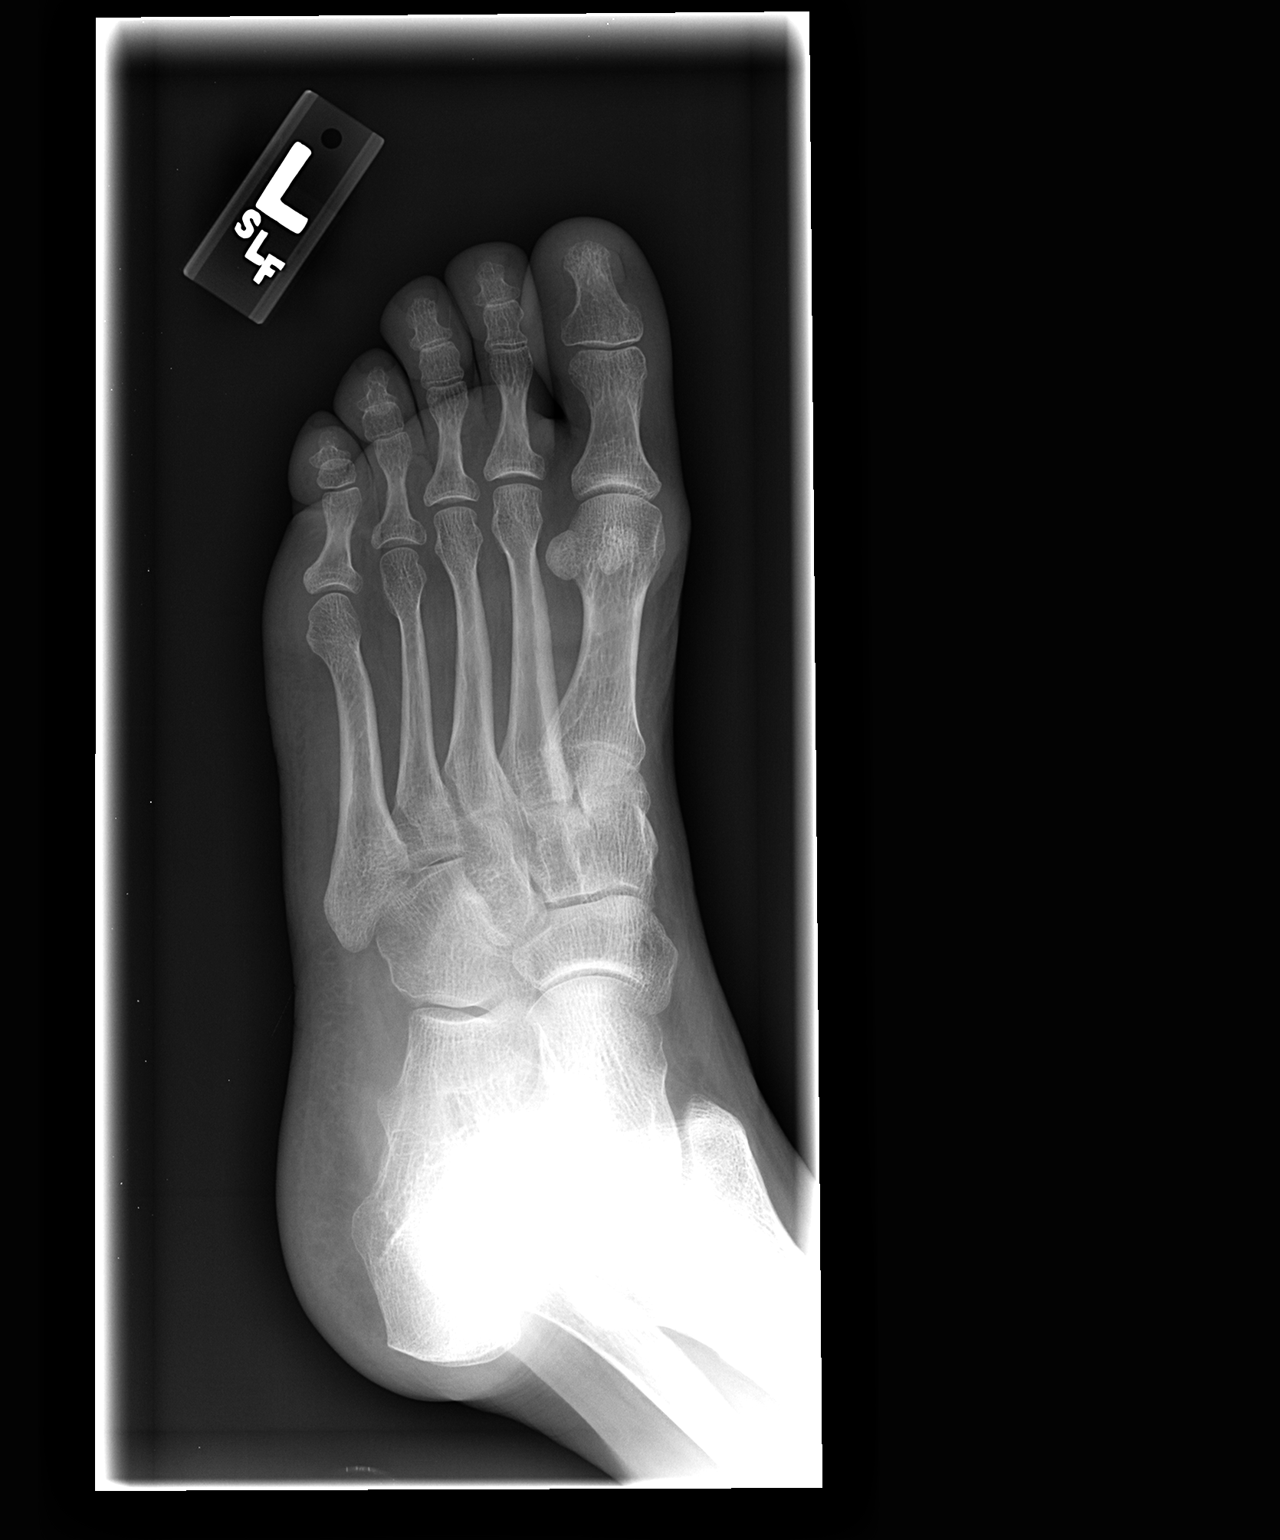

[view not recorded (3 of 3)]
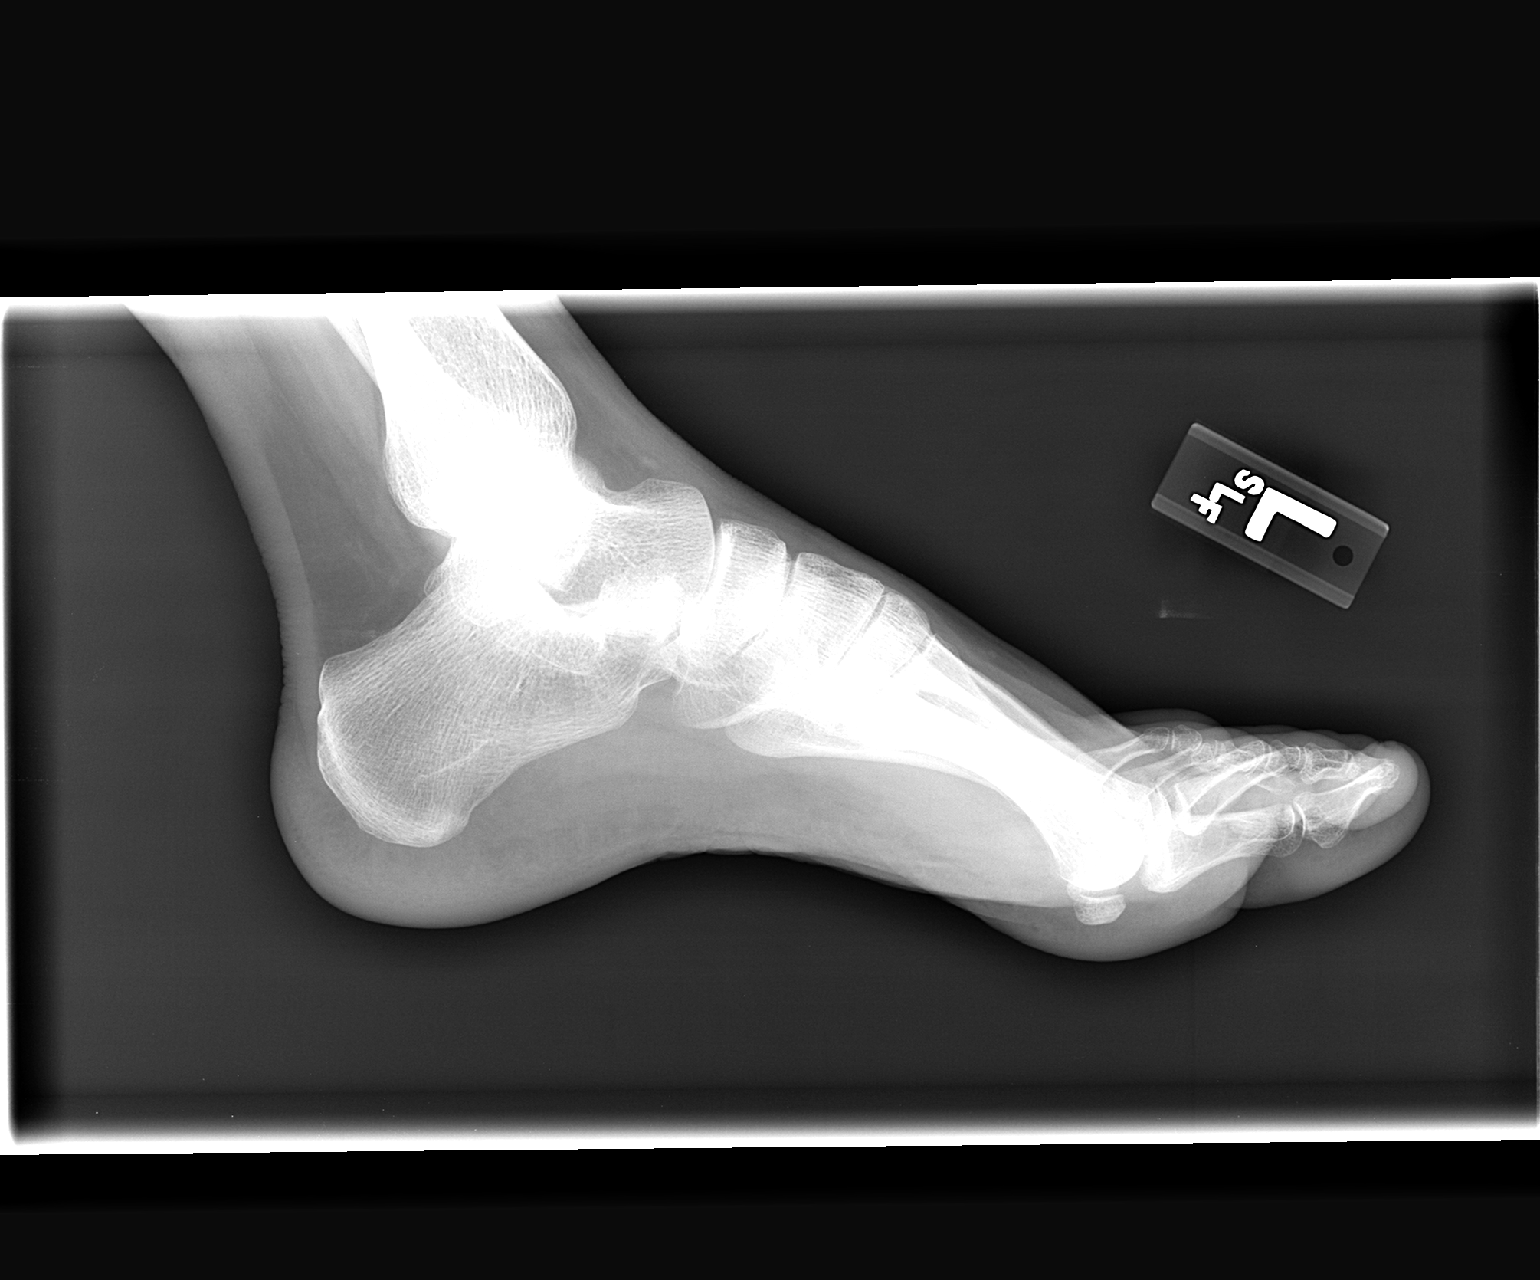

[3 of 3 positions shown; findings below may reference images not displayed]

FINDINGS: The joint spaces are maintained.  No acute fracture is
identified.  Mild Chingying Skipper deformity.  No degenerative changes.
IMPRESSION: No acute bony findings.

## 2011-11-06 ENCOUNTER — Encounter: Payer: Self-pay | Admitting: Gastroenterology

## 2011-11-19 ENCOUNTER — Encounter: Payer: Self-pay | Admitting: Gastroenterology

## 2011-11-19 ENCOUNTER — Other Ambulatory Visit (INDEPENDENT_AMBULATORY_CARE_PROVIDER_SITE_OTHER): Payer: Self-pay

## 2011-11-19 ENCOUNTER — Ambulatory Visit (INDEPENDENT_AMBULATORY_CARE_PROVIDER_SITE_OTHER): Payer: Self-pay | Admitting: Gastroenterology

## 2011-11-19 VITALS — BP 114/62 | HR 66 | Ht 61.0 in | Wt 158.6 lb

## 2011-11-19 DIAGNOSIS — R1031 Right lower quadrant pain: Secondary | ICD-10-CM | POA: Insufficient documentation

## 2011-11-19 DIAGNOSIS — R159 Full incontinence of feces: Secondary | ICD-10-CM

## 2011-11-19 LAB — COMPREHENSIVE METABOLIC PANEL
ALT: 24 U/L (ref 0–35)
Albumin: 3.6 g/dL (ref 3.5–5.2)
CO2: 27 mEq/L (ref 19–32)
Calcium: 8.5 mg/dL (ref 8.4–10.5)
Chloride: 107 mEq/L (ref 96–112)
Creatinine, Ser: 0.7 mg/dL (ref 0.4–1.2)
GFR: 86.3 mL/min (ref >60.00)

## 2011-11-19 LAB — CBC WITH DIFFERENTIAL/PLATELET
Basophils Absolute: 0.1 10*3/uL (ref 0.0–0.1)
Basophils Relative: 1.1 % (ref 0.0–3.0)
Hemoglobin: 13 g/dL (ref 12.0–15.0)
Lymphocytes Relative: 39.5 % (ref 12.0–46.0)
Monocytes Relative: 6.6 % (ref 3.0–12.0)
Neutro Abs: 3.7 10*3/uL (ref 1.4–7.7)
RBC: 4.19 Mil/uL (ref 3.87–5.11)
RDW: 15.9 % — ABNORMAL HIGH (ref 11.5–14.6)

## 2011-11-19 MED ORDER — HYOSCYAMINE SULFATE ER 0.375 MG PO TBCR: EXTENDED_RELEASE_TABLET | ORAL | Status: DC

## 2011-11-19 NOTE — Assessment & Plan Note (Signed)
Etiology for incontinence is unclear. There are no other abnormalities by rectal exam.  Recommendations #1 colonoscopy #2 to consider anal manometry pending results of #1

## 2011-11-19 NOTE — Patient Instructions (Signed)
You will go to the basement today for labs You have been scheduled for a colonoscopy on 11/21/2011 at 3pm Separate instructions have been given We gave you a MoviPrep sample kit today  Colonoscopy A colonoscopy is an exam to evaluate your entire colon. In this exam, your colon is cleansed. A long fiberoptic tube is inserted through your rectum and into your colon. The fiberoptic scope (endoscope) is a long bundle of enclosed and very flexible fibers. These fibers transmit light to the area examined and send images from that area to your caregiver. Discomfort is usually minimal. You may be given a drug to help you sleep (sedative) during or prior to the procedure. This exam helps to detect lumps (tumors), polyps, inflammation, and areas of bleeding. Your caregiver may also take a small piece of tissue (biopsy) that will be examined under a microscope. LET YOUR CAREGIVER KNOW ABOUT:   Allergies to food or medicine.   Medicines taken, including vitamins, herbs, eyedrops, over-the-counter medicines, and creams.   Use of steroids (by mouth or creams).   Previous problems with anesthetics or numbing medicines.   History of bleeding problems or blood clots.   Previous surgery.   Other health problems, including diabetes and kidney problems.   Possibility of pregnancy, if this applies.  BEFORE THE PROCEDURE   A clear liquid diet may be required for 2 days before the exam.   Ask your caregiver about changing or stopping your regular medications.   Liquid injections (enemas) or laxatives may be required.   A large amount of electrolyte solution may be given to you to drink over a short period of time. This solution is used to clean out your colon.   You should be present 60 minutes prior to your procedure or as directed by your caregiver.  AFTER THE PROCEDURE   If you received a sedative or pain relieving medication, you will need to arrange for someone to drive you home.   Occasionally,  there is a little blood passed with the first bowel movement. Do not be concerned.  FINDING OUT THE RESULTS OF YOUR TEST Not all test results are available during your visit. If your test results are not back during the visit, make an appointment with your caregiver to find out the results. Do not assume everything is normal if you have not heard from your caregiver or the medical facility. It is important for you to follow up on all of your test results. HOME CARE INSTRUCTIONS   It is not unusual to pass moderate amounts of gas and experience mild abdominal cramping following the procedure. This is due to air being used to inflate your colon during the exam. Walking or a warm pack on your belly (abdomen) may help.   You may resume all normal meals and activities after sedatives and medicines have worn off.   Only take over-the-counter or prescription medicines for pain, discomfort, or fever as directed by your caregiver. Do not use aspirin or blood thinners if a biopsy was taken. Consult your caregiver for medicine usage if biopsies were taken.  SEEK IMMEDIATE MEDICAL CARE IF:   You have a fever.   You pass large blood clots or fill a toilet with blood following the procedure. This may also occur 10 to 14 days following the procedure. This is more likely if a biopsy was taken.   You develop abdominal pain that keeps getting worse and cannot be relieved with medicine.  Document Released: 09/27/2000 Document Revised:  06/12/2011 Document Reviewed: 05/12/2008 [employer] [facility] Patient Information 2012 [place], [place].

## 2011-11-19 NOTE — Assessment & Plan Note (Addendum)
Abdominal pain and prior CT findings, together with family history of Crohn's disease, raises the question of inflammatory bowel disease involving the right colon.  Recommendations #1 check CBC, CRP and comprehensive metabolic profile #2 colonoscopy

## 2011-11-19 NOTE — Progress Notes (Signed)
History of Present Illness:  [name] is a 56 year old white female referred at the request of Dr. Philipp Deputy for evaluation of abdominal pain and incontinence. In April, 2012 she present with right lower quadrant pain and was ruled out for acute appendicitis. CT scan, which I reviewed, demonstrated some thickening of the colonic wall in the ascending colon. Symptoms subsided and she was well until the last month and a half when she developed recurrent right lower quadrant pain. At the same time she has been suffering from incontinence of stool. She is unaware of passing poorly formed stools. She  has normal bowel movements. She sees some blood on the tissue and mixed with stools which she attributes to vigorous cleansing.   Family history is pertinent for a daughter with Crohn's disease. She denies joint or skin changes.  She has a history of hepatitis C.    Review of Systems: She complains of arthritis headaches and hearing problems Pertinent positive and negative review of systems were noted in the above HPI section. All other review of systems were otherwise negative.    Current Medications, Allergies, Past Medical History, Past Surgical History, Family History and Social History were reviewed in [employer] electronic medical record  Vital signs were reviewed in today's medical record. Physical Exam: General: Well developed , well nourished, no acute distress Head: Normocephalic and atraumatic Eyes:  sclerae anicteric, EOMI Ears: Normal auditory acuity Mouth: No deformity or lesions Lungs: Clear throughout to auscultation Heart: Regular rate and rhythm; no murmurs, rubs or bruits Abdomen: Soft,and non distended. No masses, hepatosplenomegaly or hernias noted. Normal Bowel sounds; there is moderate tenderness in the right lower quadrant to palpation without guarding or rebound Rectal:deferred Musculoskeletal: Symmetrical with no gross deformities  Pulses:  Normal pulses  noted Extremities: No clubbing, cyanosis, edema or deformities noted Neurological: Alert oriented x 4, grossly nonfocal Psychological:  Alert and cooperative. Normal mood and affect

## 2011-11-20 ENCOUNTER — Encounter: Payer: Self-pay | Admitting: Gastroenterology

## 2011-11-21 ENCOUNTER — Ambulatory Visit (AMBULATORY_SURGERY_CENTER): Payer: Self-pay | Admitting: Gastroenterology

## 2011-11-21 ENCOUNTER — Encounter: Payer: Self-pay | Admitting: Gastroenterology

## 2011-11-21 DIAGNOSIS — R159 Full incontinence of feces: Secondary | ICD-10-CM

## 2011-11-21 DIAGNOSIS — R1031 Right lower quadrant pain: Secondary | ICD-10-CM

## 2011-11-21 MED ORDER — SODIUM CHLORIDE 0.9 % IV SOLN: 500.0000 mL | INTRAVENOUS | Status: DC

## 2011-11-21 NOTE — Progress Notes (Signed)
Brennan Bailey, CRNA administered propofol to the pt. Maw  The pt tolerated the colonoscopy very well. Maw  15:20 Hung second bag of N.S. 0.9% 500 ml. Maw

## 2011-11-21 NOTE — Progress Notes (Signed)
Patient did not experience any of the following events: a burn prior to discharge; a fall within the facility; wrong site/side/patient/procedure/implant event; or a hospital transfer or hospital admission upon discharge from the facility. (G8907) Patient did not have preoperative order for IV antibiotic SSI prophylaxis. (G8918)  

## 2011-11-21 NOTE — Op Note (Signed)
[facility] [address] [place], [place]  [address]  COLONOSCOPY PROCEDURE REPORT  PATIENT:  Linda, Hoover  MR#:  [id number] BIRTHDATE:  [birth year], 55 yrs. old  GENDER:  female ENDOSCOPIST:  Barbette Hair. Arlyce Dice, MD REF. BY:  Yisroel Ramming, M.D. PROCEDURE DATE:  11/21/2011 PROCEDURE:  Diagnostic Colonoscopy ASA CLASS:  Class II INDICATIONS:  Abdominal pain, Abnormal CT of abdomen, fecal incontinence Thickening of right colon by CT MEDICATIONS:   MAC sedation, administered by CRNA propofol 200mg IV  DESCRIPTION OF PROCEDURE:   After the risks benefits and alternatives of the procedure were thoroughly explained, informed consent was obtained.  Digital rectal exam was performed and revealed no abnormalities.   The LB 180AL [id number] endoscope was introduced through the anus and advanced to the terminal ileum which was intubated for a short distance, without limitations. The quality of the prep was excellent, using MoviPrep.  The instrument was then slowly withdrawn as the colon was fully examined. <<PROCEDUREIMAGES>>  FINDINGS:  The terminal ileum appeared normal (see image3 and image4).  A normal appearing cecum, ileocecal valve, and appendiceal orifice were identified. The ascending, hepatic flexure, transverse, splenic flexure, descending, sigmoid colon, and rectum appeared unremarkable (see image1 and image6). Retroflexed views in the rectum revealed no abnormalities.    The time to cecum =  1) 3.75  minutes. The scope was then withdrawn in 1) 11.25  minutes from the cecum and the procedure completed. COMPLICATIONS:  None ENDOSCOPIC IMPRESSION: 1) Normal terminal ileum 2) Normal colon RECOMMENDATIONS: 1) My office will arrange for you to have a Small Bowel Follow Through examintion. This is a radiology test that gives information about your small intestine. 2) Call endoscopist office to schedule a follow-up appointment in 2 weeks REPEAT EXAM:  10  years  ______________________________ Barbette Hair. Arlyce Dice, MD  CC:  n. eSIGNED:   Barbette Hair. [name] at 11/21/2011 03:32 PM  Loel Lofty, [phone]

## 2011-11-21 NOTE — Patient Instructions (Signed)
USE METAMUCIL TO HELP WITH INCONTINENCE  THE OFFICE WILL ARRANGE FOR YOU TO HAVE A SMALL BOWEL FOLLOW THROUGH EXAM.    CALL OFFICE TO SCHEDULE F/U APPT TO BE SEEN IN 2 WEEKS  SEE GREEN AND BLUE SHEETS FOR ADDITIONAL D/C INSTRUCTIONS

## 2011-11-22 ENCOUNTER — Telehealth: Payer: Self-pay | Admitting: Gastroenterology

## 2011-11-22 ENCOUNTER — Telehealth: Payer: Self-pay

## 2011-11-22 NOTE — Telephone Encounter (Signed)
Pt scheduled for small bowel follow through @[facility]  11/25/11 arrival time 10:15am for a 10:30am appt. Pt to be NPO after midnight. Scheduled to see Dr. Arlyce Dice 12/11/11@10am . Pt aware of appt dates and times.

## 2011-11-22 NOTE — Telephone Encounter (Signed)
  Follow up Call-  Call back number 11/21/2011  Post procedure Call Back phone  # [phone],    alt.  [phone]  Permission to leave phone message Yes     Patient questions:  Do you have a fever, pain , or abdominal swelling? no Pain Score  0 *  Have you tolerated food without any problems? yes  Have you been able to return to your normal activities? yes  Do you have any questions about your discharge instructions: Diet   no Medications  no Follow up visit  no  Do you have questions or concerns about your Care? no  Actions: * If pain score is 4 or above: No action needed, pain <4.

## 2011-11-25 ENCOUNTER — Other Ambulatory Visit (HOSPITAL_COMMUNITY): Payer: Self-pay

## 2011-12-10 ENCOUNTER — Other Ambulatory Visit (HOSPITAL_COMMUNITY): Payer: Self-pay

## 2011-12-11 ENCOUNTER — Ambulatory Visit: Payer: Self-pay | Admitting: Gastroenterology

## 2011-12-16 ENCOUNTER — Ambulatory Visit (HOSPITAL_COMMUNITY)
Admission: RE | Admit: 2011-12-16 | Discharge: 2011-12-16 | Disposition: A | Discharge status: Home / Self Care | Payer: Self-pay | Source: Ambulatory Visit | Attending: Gastroenterology | Admitting: Gastroenterology

## 2011-12-16 DIAGNOSIS — K449 Diaphragmatic hernia without obstruction or gangrene: Secondary | ICD-10-CM | POA: Insufficient documentation

## 2011-12-16 DIAGNOSIS — R109 Unspecified abdominal pain: Secondary | ICD-10-CM | POA: Insufficient documentation

## 2011-12-16 DIAGNOSIS — K219 Gastro-esophageal reflux disease without esophagitis: Secondary | ICD-10-CM | POA: Insufficient documentation

## 2011-12-17 ENCOUNTER — Other Ambulatory Visit (HOSPITAL_COMMUNITY): Payer: Self-pay

## 2011-12-24 ENCOUNTER — Ambulatory Visit: Payer: Self-pay | Admitting: Gastroenterology

## 2012-01-02 ENCOUNTER — Other Ambulatory Visit (INDEPENDENT_AMBULATORY_CARE_PROVIDER_SITE_OTHER): Payer: Self-pay

## 2012-01-02 ENCOUNTER — Ambulatory Visit (INDEPENDENT_AMBULATORY_CARE_PROVIDER_SITE_OTHER): Payer: Self-pay | Admitting: Gastroenterology

## 2012-01-02 ENCOUNTER — Encounter: Payer: Self-pay | Admitting: Gastroenterology

## 2012-01-02 VITALS — BP 140/70 | HR 80 | Ht 61.0 in | Wt 168.8 lb

## 2012-01-02 DIAGNOSIS — R159 Full incontinence of feces: Secondary | ICD-10-CM

## 2012-01-02 DIAGNOSIS — R635 Abnormal weight gain: Secondary | ICD-10-CM | POA: Insufficient documentation

## 2012-01-02 DIAGNOSIS — R634 Abnormal weight loss: Secondary | ICD-10-CM

## 2012-01-02 DIAGNOSIS — R1031 Right lower quadrant pain: Secondary | ICD-10-CM

## 2012-01-02 NOTE — Assessment & Plan Note (Signed)
Plan to check thyroid function test

## 2012-01-02 NOTE — Patient Instructions (Signed)
Research will be speaking with you today You will go to the basement for labs today 

## 2012-01-02 NOTE — Assessment & Plan Note (Signed)
This has improved since adding fiber

## 2012-01-02 NOTE — Progress Notes (Signed)
History of Present Illness:  [name] has returned following colonoscopy. This exam did not reveal any abnormalities. It included examination of the terminal ileum. A followup small bowel follow-through was unrevealing.  Mrs. Roa continues to complain of severe constipation and right lower quadrant pain. Pain is present with and without a bowel movement. She no longer has incontinence. Her other complaint is  weight gain. She's gained 35 pounds in the last 9 months, 8 pounds since her last visit. She denies change in diet.    Review of Systems: Pertinent positive and negative review of systems were noted in the above HPI section. All other review of systems were otherwise negative.    Current Medications, Allergies, Past Medical History, Past Surgical History, Family History and Social History were reviewed in [employer] electronic medical record  Vital signs were reviewed in today's medical record. Physical Exam: General: Well developed , well nourished, no acute distress

## 2012-01-02 NOTE — Assessment & Plan Note (Signed)
Workup including examination of the colon, terminal ileum and small bowel follow-through were negative. Together with constipation I suspect that she has IBS/constipation.  Recommendations #1 patient will consider involvement in IBS trial

## 2012-01-03 LAB — T4: T4, Total: 9.9 ug/dL (ref 5.0–12.5)

## 2012-01-23 ENCOUNTER — Telehealth: Payer: Self-pay | Admitting: Gastroenterology

## 2012-01-23 NOTE — Telephone Encounter (Signed)
Left message for pt to call back  °

## 2012-01-24 NOTE — Telephone Encounter (Signed)
Pt wanted to know her Thyroid lab results. Results given to pt.

## 2013-10-12 ENCOUNTER — Encounter (HOSPITAL_COMMUNITY): Payer: Self-pay | Admitting: Emergency Medicine

## 2013-10-12 ENCOUNTER — Emergency Department (HOSPITAL_COMMUNITY)
Admission: EM | Admit: 2013-10-12 | Discharge: 2013-10-12 | Disposition: A | Discharge status: Home / Self Care | Payer: Self-pay | Attending: Emergency Medicine | Admitting: Emergency Medicine

## 2013-10-12 ENCOUNTER — Emergency Department (HOSPITAL_COMMUNITY): Payer: Self-pay

## 2013-10-12 DIAGNOSIS — Z862 Personal history of diseases of the blood and blood-forming organs and certain disorders involving the immune mechanism: Secondary | ICD-10-CM | POA: Insufficient documentation

## 2013-10-12 DIAGNOSIS — Z8619 Personal history of other infectious and parasitic diseases: Secondary | ICD-10-CM | POA: Insufficient documentation

## 2013-10-12 DIAGNOSIS — Z90710 Acquired absence of both cervix and uterus: Secondary | ICD-10-CM | POA: Insufficient documentation

## 2013-10-12 DIAGNOSIS — Z95818 Presence of other cardiac implants and grafts: Secondary | ICD-10-CM | POA: Insufficient documentation

## 2013-10-12 DIAGNOSIS — R1031 Right lower quadrant pain: Secondary | ICD-10-CM | POA: Insufficient documentation

## 2013-10-12 DIAGNOSIS — Z8719 Personal history of other diseases of the digestive system: Secondary | ICD-10-CM | POA: Insufficient documentation

## 2013-10-12 DIAGNOSIS — Z87891 Personal history of nicotine dependence: Secondary | ICD-10-CM | POA: Insufficient documentation

## 2013-10-12 DIAGNOSIS — I2510 Atherosclerotic heart disease of native coronary artery without angina pectoris: Secondary | ICD-10-CM | POA: Insufficient documentation

## 2013-10-12 DIAGNOSIS — Z8739 Personal history of other diseases of the musculoskeletal system and connective tissue: Secondary | ICD-10-CM | POA: Insufficient documentation

## 2013-10-12 DIAGNOSIS — Z8669 Personal history of other diseases of the nervous system and sense organs: Secondary | ICD-10-CM | POA: Insufficient documentation

## 2013-10-12 DIAGNOSIS — Z8639 Personal history of other endocrine, nutritional and metabolic disease: Secondary | ICD-10-CM | POA: Insufficient documentation

## 2013-10-12 DIAGNOSIS — J449 Chronic obstructive pulmonary disease, unspecified: Secondary | ICD-10-CM | POA: Insufficient documentation

## 2013-10-12 DIAGNOSIS — Z9089 Acquired absence of other organs: Secondary | ICD-10-CM | POA: Insufficient documentation

## 2013-10-12 DIAGNOSIS — J4489 Other specified chronic obstructive pulmonary disease: Secondary | ICD-10-CM | POA: Insufficient documentation

## 2013-10-12 DIAGNOSIS — R109 Unspecified abdominal pain: Secondary | ICD-10-CM

## 2013-10-12 DIAGNOSIS — R110 Nausea: Secondary | ICD-10-CM | POA: Insufficient documentation

## 2013-10-12 LAB — CBC WITH DIFFERENTIAL/PLATELET
Basophils Absolute: 0 10*3/uL (ref 0.0–0.1)
Basophils Relative: 0 % (ref 0–1)
Eosinophils Absolute: 0.3 10*3/uL (ref 0.0–0.7)
Eosinophils Relative: 3 % (ref 0–5)
HCT: 40.6 % (ref 36.0–46.0)
MCH: 29.1 pg (ref 26.0–34.0)
MCHC: 32 g/dL (ref 30.0–36.0)
MCV: 90.8 fL (ref 78.0–100.0)
Monocytes Absolute: 0.7 10*3/uL (ref 0.1–1.0)
Neutro Abs: 6.7 10*3/uL (ref 1.7–7.7)
RDW: 14.2 % (ref 11.5–15.5)

## 2013-10-12 LAB — URINALYSIS, ROUTINE W REFLEX MICROSCOPIC
Leukocytes, UA: NEGATIVE
Nitrite: NEGATIVE
Protein, ur: NEGATIVE mg/dL
Urobilinogen, UA: 0.2 mg/dL (ref 0.0–1.0)

## 2013-10-12 LAB — COMPREHENSIVE METABOLIC PANEL
AST: 26 U/L (ref 0–37)
Albumin: 3.6 g/dL (ref 3.5–5.2)
BUN: 13 mg/dL (ref 6–23)
Calcium: 9.2 mg/dL (ref 8.4–10.5)
Creatinine, Ser: 0.81 mg/dL (ref 0.50–1.10)
Total Protein: 7.5 g/dL (ref 6.0–8.3)

## 2013-10-12 LAB — LIPASE, BLOOD: Lipase: 17 U/L (ref 11–59)

## 2013-10-12 MED ORDER — ONDANSETRON HCL 4 MG/2ML IJ SOLN
4.0000 mg | Freq: Once | INTRAMUSCULAR | Status: AC
  Administered 2013-10-12: 4 mg via INTRAVENOUS
  Filled 2013-10-12: qty 2

## 2013-10-12 MED ORDER — ONDANSETRON HCL 4 MG PO TABS: 4.0000 mg | ORAL_TABLET | Freq: Four times a day (QID) | ORAL | Status: DC

## 2013-10-12 MED ORDER — IOHEXOL 300 MG/ML  SOLN
50.0000 mL | Freq: Once | INTRAMUSCULAR | Status: AC | PRN
  Administered 2013-10-12: 50 mL via ORAL

## 2013-10-12 MED ORDER — SODIUM CHLORIDE 0.9 % IV BOLUS (SEPSIS)
1000.0000 mL | Freq: Once | INTRAVENOUS | Status: AC
  Administered 2013-10-12: 1000 mL via INTRAVENOUS

## 2013-10-12 MED ORDER — IOHEXOL 300 MG/ML  SOLN
100.0000 mL | Freq: Once | INTRAMUSCULAR | Status: AC | PRN
  Administered 2013-10-12: 100 mL via INTRAVENOUS

## 2013-10-12 MED ORDER — HYDROMORPHONE HCL PF 1 MG/ML IJ SOLN
1.0000 mg | Freq: Once | INTRAMUSCULAR | Status: AC
  Administered 2013-10-12: 1 mg via INTRAVENOUS
  Filled 2013-10-12: qty 1

## 2013-10-12 NOTE — Progress Notes (Signed)
[facility] provided pt with a list of primary care resources and [facility] information.  °

## 2013-10-12 NOTE — ED Notes (Signed)
Pt c/o right sided lower abd pain x 2 days; history of colitis; nausea

## 2013-10-12 NOTE — ED Provider Notes (Signed)
CSN: [id number]     Arrival date & time 10/12/13  7829 History   First MD Initiated Contact with Patient 10/12/13 [phone]     Chief Complaint  Patient presents with  . Abdominal Pain   (Consider location/radiation/quality/duration/timing/severity/associated sxs/prior Treatment) HPI Comments: 57 yo female with hep C, colitis presents with RLQ/ flank pain gradually worsening for 4 days.  No fevers.  Mild nausea. Similar to previous colitis.  Pt has appendix.  Colonoscopy 1 yr ago okay per pt.  No known Crohn's but has genetic hx for it.  No diarrhea or bleeding.  Intermittent. No stone hx.    Patient is a 57 y.o. female presenting with abdominal pain. The history is provided by the patient.  Abdominal Pain Associated symptoms: nausea   Associated symptoms: no chest pain, no chills, no dysuria, no fever, no shortness of breath and no vomiting     Past Medical History  Diagnosis Date  . 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 Nonspecific abnormal results of thyroid function study   . Coronary atherosclerosis of unspecified type of vessel, native or graft   . Chronic airway obstruction, not elsewhere classified   . Mixed hyperlipidemia   . Chest pain, unspecified   . Carpal tunnel syndrome   . Disorders of bursae and tendons in shoulder region, unspecified   . Infective otitis externa, unspecified   . Other bursitis disorders   . Other symptoms involving nervous and musculoskeletal systems(781.99)   . Colitis   . Anemia    Past Surgical History  Procedure Laterality Date  . Abdominal hysterectomy    . Cholecystectomy    . Cesarean section    . Cardiac catheterization    . Tonsillectomy     Family History  Problem Relation Age of Onset  . Crohn's disease Daughter 28    colon resection  . Irritable bowel syndrome Daughter   . Colonic polyp Mother   . Uterine cancer Maternal Grandmother   . Heart murmur Daughter     SVT  . Colon cancer Neg Hx   . Thyroid disease  Mother    History  Substance Use Topics  . Smoking status: Former Smoker    Quit date: 10/07/2011  . Smokeless tobacco: Never Used  . Alcohol Use: No   OB History   Grav Para Term Preterm Abortions TAB SAB Ect Mult Living                 Review of Systems  Constitutional: Negative for fever and chills.  HENT: Negative for congestion.   Eyes: Negative for visual disturbance.  Respiratory: Negative for shortness of breath.   Cardiovascular: Negative for chest pain.  Gastrointestinal: Positive for nausea and abdominal pain. Negative for vomiting.  Genitourinary: Positive for flank pain. Negative for dysuria.  Musculoskeletal: Negative for back pain, neck pain and neck stiffness.  Skin: Negative for rash.  Neurological: Negative for light-headedness and headaches.    Allergies  Acetaminophen-codeine and Azithromycin  Home Medications   Current Outpatient Rx  Name  Route  Sig  Dispense  Refill  . acetaminophen (TYLENOL) 500 MG tablet   Oral   Take 1,000 mg by mouth every 6 (six) hours as needed for moderate pain.         . naproxen sodium (ANAPROX) 220 MG tablet   Oral   Take 220 mg by mouth 2 (two) times daily as needed (pain).  BP 137/72  Pulse 65  Temp(Src) 97.7 F (36.5 C)  Resp 20  SpO2 96% Physical Exam  Nursing note and vitals reviewed. Constitutional: She is oriented to person, place, and time. She appears well-developed and well-nourished.  HENT:  Head: Normocephalic and atraumatic.  Eyes: Conjunctivae are normal. Right eye exhibits no discharge. Left eye exhibits no discharge.  Neck: Normal range of motion. Neck supple. No tracheal deviation present.  Cardiovascular: Normal rate and regular rhythm.   Pulmonary/Chest: Effort normal and breath sounds normal.  Abdominal: Soft. She exhibits no distension. There is tenderness (right lower flank/ RLQ). There is no guarding.  Musculoskeletal: She exhibits no edema.  Neurological: She is alert and  oriented to person, place, and time.  Skin: Skin is warm. No rash noted.  Psychiatric: She has a normal mood and affect.    ED Course  Procedures (including critical care time) Emergency Focused Ultrasound Exam Limited retroperitoneal ultrasound of kidneys  Performed and interpreted by Dr. Jodi Mourning Indication: flank pain right Focused abdominal ultrasound with both kidneys imaged in transverse and longitudinal planes in real-time. Interpretation: no hydronephrosis visualized.  Small left simple cyst visualized Images archived electronically  Labs Review Labs Reviewed  CBC WITH DIFFERENTIAL - Abnormal; Notable for the following:    WBC 10.7 (*)    All other components within normal limits  COMPREHENSIVE METABOLIC PANEL - Abnormal; Notable for the following:    Glucose, Bld 124 (*)    Alkaline Phosphatase 119 (*)    GFR calc non Af Amer 79 (*)    All other components within normal limits  LIPASE, BLOOD  URINALYSIS, ROUTINE W REFLEX MICROSCOPIC   Imaging Review Ct Abdomen Pelvis W Contrast  10/12/2013   CLINICAL DATA:  Right lower quadrant pain and nausea. History of colitis.  EXAM: CT ABDOMEN AND PELVIS WITH CONTRAST  TECHNIQUE: Multidetector CT imaging of the abdomen and pelvis was performed using the standard protocol following bolus administration of intravenous contrast.  CONTRAST:  50mL OMNIPAQUE IOHEXOL 300 MG/ML SOLN, OMNIPAQUE IOHEXOL 300 MG/ML SOLN  COMPARISON:  01/27/2011  FINDINGS: Lung bases show mild chronic scarring. No pleural or pericardial fluid.  The liver parenchyma appears normal. There has been previous cholecystectomy. Ductal system is slightly prominent, within normal limits considering previous cholecystectomy. The spleen is normal. The pancreas is normal. The adrenal glands are normal. The right kidney is normal. The left kidney again shows a benign appearing cyst in the midportion measuring 2 cm in diameter. The aorta shows atherosclerosis but no aneurysm.  The IVC is unremarkable. No retroperitoneal mass or adenopathy.  The bowel appears normal on today's study. The appendix is normal. No evidence of colitis. No free fluid. There is been previous hysterectomy. No significant bony finding.  IMPRESSION: No cause of pain identified. No evidence of appendicitis or colitis. Previous cholecystectomy and hysterectomy.   Electronically Signed   By: Paulina Fusi M.D.   On: 10/12/2013 10:04    EKG Interpretation   None       MDM   1. Right sided abdominal pain    Well appearing. Colitis vs appy vs stone. Bedside US done for signs of hydronephrosis, none seen. Fluids and pain meds.   CT abdo no acute findings, minimal pain on recheck, improved.    Results and differential diagnosis were discussed with the patient. Close follow up outpatient was discussed, patient comfortable with the plan.   Diagnosis: Right abdominal pain, nausea    f  Enid Skeens,  MD 10/12/13 1017

## 2014-06-15 ENCOUNTER — Encounter (HOSPITAL_COMMUNITY): Payer: Self-pay | Admitting: Emergency Medicine

## 2014-06-15 ENCOUNTER — Emergency Department (HOSPITAL_COMMUNITY)
Admission: EM | Admit: 2014-06-15 | Discharge: 2014-06-15 | Disposition: A | Discharge status: Home / Self Care | Payer: Self-pay | Attending: Emergency Medicine | Admitting: Emergency Medicine

## 2014-06-15 DIAGNOSIS — Z8639 Personal history of other endocrine, nutritional and metabolic disease: Secondary | ICD-10-CM | POA: Insufficient documentation

## 2014-06-15 DIAGNOSIS — Z862 Personal history of diseases of the blood and blood-forming organs and certain disorders involving the immune mechanism: Secondary | ICD-10-CM | POA: Insufficient documentation

## 2014-06-15 DIAGNOSIS — J449 Chronic obstructive pulmonary disease, unspecified: Secondary | ICD-10-CM | POA: Insufficient documentation

## 2014-06-15 DIAGNOSIS — Z8719 Personal history of other diseases of the digestive system: Secondary | ICD-10-CM | POA: Insufficient documentation

## 2014-06-15 DIAGNOSIS — Z9889 Other specified postprocedural states: Secondary | ICD-10-CM | POA: Insufficient documentation

## 2014-06-15 DIAGNOSIS — R21 Rash and other nonspecific skin eruption: Secondary | ICD-10-CM

## 2014-06-15 DIAGNOSIS — I2510 Atherosclerotic heart disease of native coronary artery without angina pectoris: Secondary | ICD-10-CM | POA: Insufficient documentation

## 2014-06-15 DIAGNOSIS — Z8669 Personal history of other diseases of the nervous system and sense organs: Secondary | ICD-10-CM | POA: Insufficient documentation

## 2014-06-15 DIAGNOSIS — J4489 Other specified chronic obstructive pulmonary disease: Secondary | ICD-10-CM | POA: Insufficient documentation

## 2014-06-15 DIAGNOSIS — Z8619 Personal history of other infectious and parasitic diseases: Secondary | ICD-10-CM | POA: Insufficient documentation

## 2014-06-15 DIAGNOSIS — Z8739 Personal history of other diseases of the musculoskeletal system and connective tissue: Secondary | ICD-10-CM | POA: Insufficient documentation

## 2014-06-15 DIAGNOSIS — Z87891 Personal history of nicotine dependence: Secondary | ICD-10-CM | POA: Insufficient documentation

## 2014-06-15 MED ORDER — HYDROXYZINE HCL 25 MG PO TABS
25.0000 mg | ORAL_TABLET | Freq: Once | ORAL | Status: AC
  Administered 2014-06-15: 25 mg via ORAL
  Filled 2014-06-15: qty 1

## 2014-06-15 MED ORDER — PERMETHRIN 5 % EX CREA: TOPICAL_CREAM | CUTANEOUS | Status: DC

## 2014-06-15 MED ORDER — DEXAMETHASONE SODIUM PHOSPHATE 10 MG/ML IJ SOLN
10.0000 mg | Freq: Once | INTRAMUSCULAR | Status: AC
  Administered 2014-06-15: 10 mg via INTRAMUSCULAR
  Filled 2014-06-15: qty 1

## 2014-06-15 MED ORDER — HYDROXYZINE HCL 25 MG PO TABS: 25.0000 mg | ORAL_TABLET | Freq: Four times a day (QID) | ORAL | Status: DC

## 2014-06-15 NOTE — ED Notes (Signed)
Declined W/C at D/C and was escorted to lobby by RN. 

## 2014-06-15 NOTE — Discharge Instructions (Signed)
Avoid scratching. Apply calamine lotion for itching. Vistaril for itching as prescribed. Apply permethrin cream as prescribed for possible scabies. Wash your sheet in hot water. Follow up with your doctor    Rash A rash is a change in the color or texture of your skin. There are many different types of rashes. You may have other problems that accompany your rash. CAUSES   Infections.  Allergic reactions. This can include allergies to pets or foods.  Certain medicines.  Exposure to certain chemicals, soaps, or cosmetics.  Heat.  Exposure to poisonous plants.  Tumors, both cancerous and noncancerous. SYMPTOMS   Redness.  Scaly skin.  Itchy skin.  Dry or cracked skin.  Bumps.  Blisters.  Pain. DIAGNOSIS  Your caregiver may do a physical exam to determine what type of rash you have. A skin sample (biopsy) may be taken and examined under a microscope. TREATMENT  Treatment depends on the type of rash you have. Your caregiver may prescribe certain medicines. For serious conditions, you may need to see a skin doctor (dermatologist). HOME CARE INSTRUCTIONS   Avoid the substance that caused your rash.  Do not scratch your rash. This can cause infection.  You may take cool baths to help stop itching.  Only take over-the-counter or prescription medicines as directed by your caregiver.  Keep all follow-up appointments as directed by your caregiver. SEEK IMMEDIATE MEDICAL CARE IF:  You have increasing pain, swelling, or redness.  You have a fever.  You have new or severe symptoms.  You have body aches, diarrhea, or vomiting.  Your rash is not better after 3 days. MAKE SURE YOU:  Understand these instructions.  Will watch your condition.  Will get help right away if you are not doing well or get worse. Document Released: 09/20/2002 Document Revised: 12/23/2011 Document Reviewed: 07/15/2011 [facility] Patient Information 2015 [place], [place]. This information is not  intended to replace advice given to you by your health care provider. Make sure you discuss any questions you have with your health care provider.

## 2014-06-15 NOTE — ED Provider Notes (Signed)
CSN: [id number]     Arrival date & time 06/15/14  1036 History  This chart was scribed for non-physician practitioner, Lottie Mussel, PA-C, working with Toy Cookey, MD by Charline Bills, ED Scribe. This patient was seen in room TR07C/TR07C and the patient's care was started at 11:51 AM.   Chief Complaint  Patient presents with  . Rash   The history is provided by the patient. No language interpreter was used.   HPI Comments: [name] is a 58 y.o. female who presents to the Emergency Department complaining of gradually spreading rash over entire body first noted this morning. Pt reports that rash has spread since being in the ED. Pt reports associated itching since last night. She states that she went to a few stores and peeled pears yesterday. Pt denies outdoor activities yesterday. She also denies sleeping in a new bed and new pets. She denies fever and any other symptoms at this time. Pt has had chicken pox as a child. No h/o DM.   Past Medical History  Diagnosis Date  . 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 Nonspecific abnormal results of thyroid function study   . Coronary atherosclerosis of unspecified type of vessel, native or graft   . Chronic airway obstruction, not elsewhere classified   . Mixed hyperlipidemia   . Chest pain, unspecified   . Carpal tunnel syndrome   . Disorders of bursae and tendons in shoulder region, unspecified   . Infective otitis externa, unspecified   . Other bursitis disorders   . Other symptoms involving nervous and musculoskeletal systems(781.99)   . Colitis   . Anemia    Past Surgical History  Procedure Laterality Date  . Abdominal hysterectomy    . Cholecystectomy    . Cesarean section    . Cardiac catheterization    . Tonsillectomy     Family History  Problem Relation Age of Onset  . Crohn's disease Daughter 3    colon resection  . Irritable bowel syndrome Daughter   . Colonic polyp Mother   . Uterine  cancer Maternal Grandmother   . Heart murmur Daughter     SVT  . Colon cancer Neg Hx   . Thyroid disease Mother    History  Substance Use Topics  . Smoking status: Former Smoker    Quit date: 10/07/2011  . Smokeless tobacco: Never Used  . Alcohol Use: No   OB History   Grav Para Term Preterm Abortions TAB SAB Ect Mult Living                 Review of Systems  Constitutional: Negative for fever and chills.  Skin: Positive for rash.  All other systems reviewed and are negative.  Allergies  Acetaminophen-codeine and Azithromycin  Home Medications   Prior to Admission medications   Medication Sig Start Date End Date Taking? Authorizing Provider  diphenhydrAMINE (BENADRYL) 25 mg capsule Take 25 mg by mouth every 6 (six) hours as needed for itching.   Yes Historical Provider, MD   Triage Vitals: BP 116/67  Pulse 66  Temp(Src) 97.8 F (36.6 C) (Oral)  SpO2 97% Physical Exam  Nursing note and vitals reviewed. Constitutional: She is oriented to person, place, and time. She appears well-developed and well-nourished.  HENT:  Head: Normocephalic and atraumatic.  No lesions over oral mucosa  Eyes: Conjunctivae and EOM are normal.  Neck: Neck supple.  Cardiovascular: Normal rate.   Pulmonary/Chest: Effort normal.  Musculoskeletal:  Normal range of motion.  Neurological: She is alert and oriented to person, place, and time.  Skin: Skin is warm and dry. Rash noted.  Diffuse erythematous, papular rash, including chest, abdomen, bilateral groin, lower back, bilateral forearms and upper arms, bilateral ankles and lower legs. No vesicles, no pustules. Rash is nontender. It is very itchy.  Psychiatric: She has a normal mood and affect. Her behavior is normal.   ED Course  Procedures (including critical care time) DIAGNOSTIC STUDIES: Oxygen Saturation is 97% on RA, normal by my interpretation.    COORDINATION OF CARE: 11:58 AM-Discussed treatment plan which includes cream with pt  at bedside and pt agreed to plan.   Labs Review Labs Reviewed - No data to display  Imaging Review No results found.   EKG Interpretation None      MDM   Final diagnoses:  None    Patient with diffuse body rash, very itchy. Differential includes contact dermatitis versus scabies versus bedbugs. No oral mucosal involvement. No new medications or products. Patient is very uncomfortable, given hydroxyzine and Decadron shot in emergency department. Will discharge home with hydroxyzine and tail might cream. She is to followup with her primary care Dr.  Ceasar Mons Vitals:   06/15/14 1055 06/15/14 1243  BP: 116/67 104/63  Pulse: 66 60  Temp: 97.8 F (36.6 C) 97.8 F (36.6 C)  TempSrc: Oral Oral  Resp:  12  SpO2: 97% 99%     I personally performed the services described in this documentation, which was scribed in my presence. The recorded information has been reviewed and is accurate.    Lottie Mussel, PA-C 06/15/14 1633

## 2014-06-15 NOTE — ED Notes (Signed)
Pt reports after going to sleep last night the itching started. Pt has a rash over entire body.

## 2014-06-18 NOTE — ED Provider Notes (Signed)
Medical screening examination/treatment/procedure(s) were performed by non-physician practitioner and as supervising physician I was immediately available for consultation/collaboration.   EKG Interpretation None        Candyce Churn III, MD 06/18/14 2242

## 2014-06-24 NOTE — Discharge Planning (Signed)
[facility]   Spoke to patient regarding primary care resources and establishing care with a provider. Patient was given the orange card application and instructions on how to complete and turn in the application, pt verbalized understanding. Resource guide and my contact information also provided for any future questions or concerns.

## 2015-02-24 ENCOUNTER — Emergency Department (HOSPITAL_COMMUNITY)
Admission: EM | Admit: 2015-02-24 | Discharge: 2015-02-24 | Disposition: A | Discharge status: Home / Self Care | Payer: Self-pay | Attending: Emergency Medicine | Admitting: Emergency Medicine

## 2015-02-24 ENCOUNTER — Emergency Department (HOSPITAL_COMMUNITY): Payer: Self-pay

## 2015-02-24 ENCOUNTER — Other Ambulatory Visit: Payer: Self-pay

## 2015-02-24 ENCOUNTER — Encounter (HOSPITAL_COMMUNITY): Payer: Self-pay | Admitting: Emergency Medicine

## 2015-02-24 DIAGNOSIS — Z720 Tobacco use: Secondary | ICD-10-CM | POA: Insufficient documentation

## 2015-02-24 DIAGNOSIS — Z8619 Personal history of other infectious and parasitic diseases: Secondary | ICD-10-CM | POA: Insufficient documentation

## 2015-02-24 DIAGNOSIS — Z8719 Personal history of other diseases of the digestive system: Secondary | ICD-10-CM | POA: Insufficient documentation

## 2015-02-24 DIAGNOSIS — J449 Chronic obstructive pulmonary disease, unspecified: Secondary | ICD-10-CM | POA: Insufficient documentation

## 2015-02-24 DIAGNOSIS — Z79899 Other long term (current) drug therapy: Secondary | ICD-10-CM | POA: Insufficient documentation

## 2015-02-24 DIAGNOSIS — Z8639 Personal history of other endocrine, nutritional and metabolic disease: Secondary | ICD-10-CM | POA: Insufficient documentation

## 2015-02-24 DIAGNOSIS — Y9289 Other specified places as the place of occurrence of the external cause: Secondary | ICD-10-CM | POA: Insufficient documentation

## 2015-02-24 DIAGNOSIS — Z862 Personal history of diseases of the blood and blood-forming organs and certain disorders involving the immune mechanism: Secondary | ICD-10-CM | POA: Insufficient documentation

## 2015-02-24 DIAGNOSIS — Z7982 Long term (current) use of aspirin: Secondary | ICD-10-CM | POA: Insufficient documentation

## 2015-02-24 DIAGNOSIS — Z9889 Other specified postprocedural states: Secondary | ICD-10-CM | POA: Insufficient documentation

## 2015-02-24 DIAGNOSIS — S2241XA Multiple fractures of ribs, right side, initial encounter for closed fracture: Secondary | ICD-10-CM | POA: Insufficient documentation

## 2015-02-24 DIAGNOSIS — Z8669 Personal history of other diseases of the nervous system and sense organs: Secondary | ICD-10-CM | POA: Insufficient documentation

## 2015-02-24 DIAGNOSIS — S2231XA Fracture of one rib, right side, initial encounter for closed fracture: Secondary | ICD-10-CM

## 2015-02-24 DIAGNOSIS — Y998 Other external cause status: Secondary | ICD-10-CM | POA: Insufficient documentation

## 2015-02-24 DIAGNOSIS — W010XXA Fall on same level from slipping, tripping and stumbling without subsequent striking against object, initial encounter: Secondary | ICD-10-CM | POA: Insufficient documentation

## 2015-02-24 DIAGNOSIS — I2510 Atherosclerotic heart disease of native coronary artery without angina pectoris: Secondary | ICD-10-CM | POA: Insufficient documentation

## 2015-02-24 DIAGNOSIS — Y9389 Activity, other specified: Secondary | ICD-10-CM | POA: Insufficient documentation

## 2015-02-24 MED ORDER — OXYCODONE-ACETAMINOPHEN 5-325 MG PO TABS
2.0000 | ORAL_TABLET | Freq: Once | ORAL | Status: AC
  Administered 2015-02-24: 2 via ORAL
  Filled 2015-02-24: qty 2

## 2015-02-24 MED ORDER — OXYCODONE-ACETAMINOPHEN 5-325 MG PO TABS: ORAL_TABLET | ORAL | Status: DC

## 2015-02-24 NOTE — ED Notes (Signed)
Pulse ox 98% with ambulation

## 2015-02-24 NOTE — ED Notes (Signed)
Patient called for triage, did not respond to nurse aide.

## 2015-02-24 NOTE — ED Provider Notes (Signed)
CSN: [id number]     Arrival date & time 02/24/15  1119 History   First MD Initiated Contact with Patient 02/24/15 1212     Chief Complaint  Patient presents with  . Fall     HPI Pt was seen at 1315. Per pt, c/o sudden onset and resolution of one episode of slip and fall that occurred 4 days ago. Pt states she slipped and fell, landing on her right side. Pt c/o right sided ribs "pain" since the fall. Pt states she "feels like maybe I broke a rib." Pain worsens with palpation of the area, body position changes, and deep breath. Denies head injury, no neck or back pain, no abd pain, no N/V/D, no SOB/cough, no fevers, no open wounds. Denies any other injuries.    Past Medical History  Diagnosis Date  . 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 Nonspecific abnormal results of thyroid function study   . Coronary atherosclerosis of unspecified type of vessel, native or graft   . Chronic airway obstruction, not elsewhere classified   . Mixed hyperlipidemia   . Chest pain, unspecified   . Carpal tunnel syndrome   . Disorders of bursae and tendons in shoulder region, unspecified   . Infective otitis externa, unspecified   . Other bursitis disorders   . Other symptoms involving nervous and musculoskeletal systems(781.99)   . Colitis   . Anemia    Past Surgical History  Procedure Laterality Date  . Abdominal hysterectomy    . Cholecystectomy    . Cesarean section    . Cardiac catheterization    . Tonsillectomy     Family History  Problem Relation Age of Onset  . Crohn's disease Daughter 10133    colon resection  . Irritable bowel syndrome Daughter   . Colonic polyp Mother   . Uterine cancer Maternal Grandmother   . Heart murmur Daughter     SVT  . Colon cancer Neg Hx   . Thyroid disease Mother    History  Substance Use Topics  . Smoking status: Light Tobacco Smoker    Last Attempt to Quit: 10/07/2011  . Smokeless tobacco: Never Used  . Alcohol Use: No    Review of  Systems ROS: Statement: All systems negative except as marked or noted in the HPI; Constitutional: Negative for fever and chills. ; ; Eyes: Negative for eye pain, redness and discharge. ; ; ENMT: Negative for ear pain, hoarseness, nasal congestion, sinus pressure and sore throat. ; ; Cardiovascular: Negative for chest pain, palpitations, diaphoresis, dyspnea and peripheral edema. ; ; Respiratory: Negative for cough, wheezing and stridor. ; ; Gastrointestinal: Negative for nausea, vomiting, diarrhea, abdominal pain, blood in stool, hematemesis, jaundice and rectal bleeding. . ; ; Genitourinary: Negative for dysuria, flank pain and hematuria. ; ; Musculoskeletal: +chest wall pain. Negative for back pain and neck pain. Negative for swelling.; ; Skin: Negative for pruritus, rash, abrasions, blisters, bruising and skin lesion.; ; Neuro: Negative for headache, lightheadedness and neck stiffness. Negative for weakness, altered level of consciousness , altered mental status, extremity weakness, paresthesias, involuntary movement, seizure and syncope.      Allergies  Acetaminophen-codeine and Azithromycin  Home Medications   Prior to Admission medications   Medication Sig Start Date End Date Taking? Authorizing Provider  acetaminophen (TYLENOL) 500 MG tablet Take 1,000 mg by mouth every 8 (eight) hours as needed for moderate pain.   Yes Historical Provider, MD  Aspirin-Salicylamide-Caffeine (BC HEADACHE POWDER PO) Take  1 packet by mouth daily as needed (pain).   Yes Historical Provider, MD  cholecalciferol (VITAMIN D) 400 UNITS TABS tablet Take 400 Units by mouth daily.   Yes Historical Provider, MD  diphenhydrAMINE (BENADRYL) 25 mg capsule Take 25 mg by mouth every 6 (six) hours as needed for itching.   Yes Historical Provider, MD  Vitamin E 100 UNITS TABS Take 1 tablet by mouth daily.   Yes Historical Provider, MD  hydrOXYzine (ATARAX/VISTARIL) 25 MG tablet Take 1 tablet (25 mg total) by mouth every 6  (six) hours. Patient not taking: Reported on 02/24/2015 06/15/14   Jaynie Crumbleatyana Kirichenko, PA-C  permethrin (ELIMITE) 5 % cream Apply neck down, leave on for 8 hrs. Repeat in 1 week. Patient not taking: Reported on 02/24/2015 06/15/14   Tatyana Kirichenko, PA-C   BP 112/61 mmHg  Pulse 64  Temp(Src) 98.4 F (36.9 C) (Oral)  Resp 16  SpO2 94% Physical Exam  1320: Physical examination:  Nursing notes reviewed; Vital signs and O2 SAT reviewed;  Constitutional: Well developed, Well nourished, Well hydrated, In no acute distress; Head:  Normocephalic, atraumatic; Eyes: EOMI, PERRL, No scleral icterus; ENMT: Mouth and pharynx normal, Mucous membranes moist; Neck: Supple, Full range of motion, No lymphadenopathy; Cardiovascular: Regular rate and rhythm, No gallop; Respiratory: Breath sounds clear & equal bilaterally, No rales, rhonchi, wheezes.  Speaking full sentences with ease, Normal respiratory effort/excursion; Chest: +right anterior lateral chest wall tender to palp. No deformity, no soft tissue crepitus, no ecchymosis, no abrasions. Movement normal; Abdomen: Soft, Nontender, Nondistended, Normal bowel sounds; Genitourinary: No CVA tenderness; Extremities: Pulses normal, No tenderness, No edema, No calf edema or asymmetry.; Neuro: AA&Ox3, Major CN grossly intact.  Speech clear. No gross focal motor or sensory deficits in extremities. Climbs on and off stretcher easily by herself. Gait steady.; Skin: Color normal, Warm, Dry.   ED Course  Procedures     MDM  MDM Reviewed: previous chart, nursing note and vitals Interpretation: x-ray     Dg Ribs Unilateral W/chest Right 02/24/2015   CLINICAL DATA:  Larey SeatFell 5 days ago, tripping over something at a restaurant. Fell onto the right chest with pain.  EXAM: RIGHT RIBS AND CHEST - 3+ VIEW  COMPARISON:  02/05/2010  FINDINGS: Heart size is normal. Mediastinal shadows are normal. The lungs show mild interstitial scarring. No sign of active infiltrate, mass, effusion  or collapse. Rib films show nondisplaced fractures of the anterior right fourth and fifth ribs.  IMPRESSION: Nondisplaced fractures of the right anterior fifth and sixth ribs. No pneumothorax. Mild pulmonary scarring.   Electronically Signed   By: Paulina FusiMark  Shogry M.D.   On: 02/24/2015 13:14    1515:  Pt medicated for pain. IS teach/treat provided. Pt ambulated with steady gait, easy resps, Sats 98% R/A. Pt states she "has to go leave now" for a family emergency. Dx and testing d/w pt.  Questions answered.  Verb understanding, agreeable to d/c home with outpt f/u.   Samuel JesterKathleen Lawren Sexson, DO 02/26/15 [phone]

## 2015-02-24 NOTE — ED Notes (Signed)
MD at bedside. 

## 2015-02-24 NOTE — Discharge Instructions (Signed)
°Emergency Department Resource Guide °1) Find a Doctor and Pay Out of Pocket °Although you won't have to find out who is covered by your insurance plan, it is a good idea to ask around and get recommendations. You will then need to call the office and see if the doctor you have chosen will accept you as a new patient and what types of options they offer for patients who are self-pay. Some doctors offer discounts or will set up payment plans for their patients who do not have insurance, but you will need to ask so you aren't surprised when you get to your appointment. ° °2) Contact Your Local Health Department °Not all health departments have doctors that can see patients for sick visits, but many do, so it is worth a call to see if yours does. If you don't know where your local health department is, you can check in your phone book. The CDC also has a tool to help you locate your state's health department, and many state websites also have listings of all of their local health departments. ° °3) Find a Walk-in Clinic °If your illness is not likely to be very severe or complicated, you may want to try a walk in clinic. These are popping up all over the country in pharmacies, drugstores, and shopping centers. They're usually staffed by nurse practitioners or physician assistants that have been trained to treat common illnesses and complaints. They're usually fairly quick and inexpensive. However, if you have serious medical issues or chronic medical problems, these are probably not your best option. ° °No Primary Care Doctor: °- Call [facility] Connect at  832-8000 - they can help you locate a primary care doctor that  accepts your insurance, provides certain services, etc. °- Physician Referral Service- 1-800-533-3463 ° °Chronic Pain Problems: °Organization         Address  Phone   Notes  °[facility]  (336) 297-2271 Patients need to be referred by their primary care doctor.  ° °Medication  Assistance: °Organization         Address  Phone   Notes  °[place] [facility] [address] °[place], [place] [address] (336) 641-8030 --Must be a resident of [place] °-- Must have NO insurance coverage whatsoever (no Medicaid/ Medicare, etc.) °-- The pt. MUST have a primary care doctor that directs their care regularly and follows them in the community °  °[facility]  (866) 331-1348   °[employer]  (888) 892-1162   ° °Agencies that provide inexpensive medical care: °Organization         Address  Phone   Notes  °[facility]  (336) 832-8035   °[facility]    (336) 832-7272   °[facility] [address] °[place], [place] [address] (336) 832-4777   °[facility] [address], °[place] (336) 271-4999   °[facility]    (336) 373-0678   °[facility]    (336) 272-1050   °[facility] ° [address], [place] Phone:  (336) 832-4444, Fax:  (336) 832-4440 Hours of Operation:  9 am - 6 pm, M-F.  Also accepts Medicaid/Medicare and self-pay.  °[facility] ° [address], [place] Phone: (336) 832-3150, Fax: (336) [phone]. Hours of Operation:  8:30 am - 5:30 pm, M-F.  Also accepts Medicaid and self-pay.  °[facility] [place] [address], [place] Phone: (336) 878-6027   °[facility] [address], [place], [place] (336)723-1848, Ext. 123 Mondays & Thursdays: 7-9 AM.  First 15 patients are seen on a first come, first serve basis. °  ° °Medicaid-accepting [place] Providers: ° °Organization         Address  Phone   Notes  °[facility] [address], [place] (336) 641-2100 Also accepts self-pay patients.  °[facility] [address], [place] ° (336) 856-9996   °[facility] [address], [place]  (336) 288-8857   °[facility] [address] [place] [address], [place] (336) 299-7000   °Veita Bland [address], [place]  ° (336) 373-1557 Only accepts [place] Access Medicaid patients after they have their name applied to their card.  ° °Self-Pay (no insurance) in [place]: ° °Organization         Address  Phone   Notes  °Sickle Cell Patients, [facility] [address], [place] (336) 832-1970   °[facility] [address], [place] (336) 832-4400   °[facility] ° [address], [place] (336) 992-4800   °[facility]/Dr. Osei-Bonsu ° [address] [place] [address], [place] or [address], [place] (336) 841-8500 Phone number for both [place] and [place] locations is the same.  °[facility] [address], [place] (336) 299-0000   °[facility] [address] [place] [address], [place] or [address] (336) 852-7530 °(336) 878-2260   °[facility] [address], [place] (336) 350-1642, phone; (336) 294-5005, fax Sees patients 1st and 3rd Saturday of every month.  Must not qualify for public or private insurance (i.e. Medicaid, Medicare, [place] Health Choice, Veterans' Benefits) • Household income should be no more than 200% of the poverty level •The clinic cannot treat you if you are pregnant or think you are pregnant • Sexually transmitted diseases are not treated at the clinic.  ° ° °Dental Care: °Organization         Address  Phone  Notes  °[place] [facility] [address], [place] (336) 641-6152 Accepts children up to age 21 who are enrolled in Medicaid or [place] Health Choice; pregnant women with a Medicaid card; and children who have applied for Medicaid or [place] Health Choice, but were declined, whose parents can pay a reduced fee at time of service.  °[place]  [facility] [place]  [address], [place] (336) 641-7733 Accepts children up to age 21 who are enrolled in Medicaid or [place] Health Choice; pregnant women with a Medicaid card; and children who have applied for Medicaid or [place] Health Choice, but were declined, whose parents can pay a reduced fee at time of service.  °[facility] ° [address], [place] (336) 641-4533 Patients are seen by appointment only. Walk-ins are not accepted. [facility] will see patients 18 years of age and older. °Monday - Tuesday (8am-5pm) °Most Wednesdays (8:30-5pm) °$30 per visit, cash only  °[facility] ° [address], [place] (336) 641-4533 Patients are seen by appointment only. Walk-ins are not accepted. [facility] will see patients 18 years of age and older. °One   Wednesday Evening (Monthly: Volunteer Based).  $30 per visit, cash only  °[facility]  (919) 537-3737 for adults; Children under age 4, call Graduate Pediatric Dentistry at (919) [phone]. Children aged 4-14, please call (919) [phone] to request a pediatric application. ° Dental services are provided in all areas of dental care including fillings, crowns and bridges, complete and partial dentures, implants, gum treatment, root canals, and extractions. Preventive care is also provided. Treatment is provided to both adults and children. °Patients are selected via a lottery and there is often a waiting list. °  °[facility] [address], °[place] ° (336) 763-8833 [email or web address] °  °[facility] [address], [place], [place] (336)723-1848, Ext. 123 Second and Fourth Thursday of each month, opens at 6:30 AM; Clinic ends at 9 AM.  Patients are seen on a first-come first-served basis, and a limited number are seen during each clinic.  ° °[facility] ° [address], [place], [place] (336) 723-7904    Eligibility Requirements °You must have lived in [place], [place], or [place] counties for at least the last three months. °  You cannot be eligible for state or federal sponsored healthcare insurance, including Veterans Administration, Medicaid, or Medicare. °  You generally cannot be eligible for healthcare insurance through your employer.  °  How to apply: °Eligibility screenings are held every Tuesday and Wednesday afternoon from 1:00 pm until 4:00 pm. You do not need an appointment for the interview!  °[facility] [address], [place], [place] 336-631-2330   °[facility]  336-342-8273   °[facility]  336-703-3100   °[facility]  336-570-6415   ° °Behavioral Health Resources in the Community: °Intensive Outpatient Programs °Organization         Address  Phone  Notes  °[facility] [address], [place], [place] 336-878-6098   °[facility] [address], [place], [place] 336-832-9800   °[facility] [address], [place], [place] ° 336-882-2125   °[facility] [address],  °[place], [place] 1-800-853-5163 or 336-641-4981   °Substance Abuse Resources °Organization         Address  Phone  Notes  °[facility]  336-882-2125   °[facility]  336-784-9470   °[facility]  336-285-9073   °[facility]  336-845-3988   °Residential & Outpatient Substance Abuse Program  1-800-659-3381   °Psychological Services °Organization         Address  Phone  Notes  °[facility]  336- 832-9600   °[facility]  336- 378-7881   °[facility] [address], [place] 1-800-853-5163 or 336-641-4981   ° °Mobile Crisis Teams °Organization         Address  Phone  Notes  °[facility], Mobile Crisis Care Unit  1-877-626-1772   °Assertive °[facility] ° [address]  [place], [place] 336-834-9664   °Sharon DeEsch [address] °[place] 336-554-5454   ° °Self-Help/Support Groups °Organization         Address  Phone             Notes  °[facility]  - variety of support groups  336- 373-1402 Call for more information  °Narcotics Anonymous (NA), [facility] [address], °[place]  2 meetings at this location  ° °  Residential Treatment Programs Organization         Address  Phone  Notes  [facility] [address],    [place]  [phone]   [facility]  [address], [place] [phone], [place], [place] [phone]   [facility] [address], [place] [phone]) [phone] Admissions: 8am-3pm M-F  [facility] [address]    [place], [place] [phone]   [facility] [address], [place], [place] [phone]   [facility] [address]  [place], [place] [phone]   [facility] Intensive Outpatient [address], [place], [place] [phone]   [facility] ([facility] [address]  [place], [place] [phone] or [phone]) [phone]  [address] [facility] ([facility]) [address] [place], [place] [phone] Accepts Medicaid  [facility] [address]  [place] [phone] Substance Abuse/Addiction Treatment   [facility]         Address  Phone  Notes  [facility]  503-378-1014(888) [phone]   Angie FavaJulie Brannon, PhD [address], [address] [place], [place]   ([phone](336) [phone] or [phone](336) [phone]   [facility]    [address] [place], [place] [phone]) [phone]   [facility] [address], [place], [place] [phone](336) 915-800-1452 Insurance/Medicaid/sponsorship through [facility]  [facility] [address]                                    [place], [place] [phone](336) [phone] Therapy/tele-psych/case    [facility] [address]   [place], [place] [phone]) [phone]    Dr. Lolly MustacheArfeen  [phone]) [phone]   [facility] [place]  [employer] [facility] 1) [address], [place] 2) [address], [place] 3)  [address], [place] [phone](336) [phone](336) [phone]  [phone]) [phone]   [facility] Child Abuse Hotline 3856292032(336) 9074160173 or [phone](336) 5013645334 (After Hours)      Take the prescription as directed.  Apply moist heat or ice to the area(s) of discomfort, for 15 minutes at a time, several times per day for the next few days.  Do not fall asleep on a heating or ice pack. Use your incentive spirometer: hold a small/firm pillow against your chest wall (to help splint the area) and perform 3 deep inhalations every 1 hour while you are awake for the next 2 to 3 weeks.   Call your regular medical doctor today to schedule a follow up appointment this week.  Return to the Emergency Department immediately if worsening.

## 2015-02-24 NOTE — ED Notes (Addendum)
59 [facility]. States pain under her right breast that radiates to her back. Reports being in bed since Monday and barley being able to breath a few days ago. Pt is ambulatory and alert at triage. Pain 9/10 has been taking OTC tylenol and ibuprofen.

## 2015-09-19 ENCOUNTER — Emergency Department (HOSPITAL_COMMUNITY): Payer: Self-pay

## 2015-09-19 ENCOUNTER — Encounter (HOSPITAL_COMMUNITY): Payer: Self-pay | Admitting: *Deleted

## 2015-09-19 ENCOUNTER — Emergency Department (HOSPITAL_COMMUNITY)
Admission: EM | Admit: 2015-09-19 | Discharge: 2015-09-19 | Disposition: A | Discharge status: Home / Self Care | Payer: Self-pay | Attending: Emergency Medicine | Admitting: Emergency Medicine

## 2015-09-19 DIAGNOSIS — R079 Chest pain, unspecified: Secondary | ICD-10-CM | POA: Insufficient documentation

## 2015-09-19 DIAGNOSIS — E782 Mixed hyperlipidemia: Secondary | ICD-10-CM | POA: Insufficient documentation

## 2015-09-19 DIAGNOSIS — Z862 Personal history of diseases of the blood and blood-forming organs and certain disorders involving the immune mechanism: Secondary | ICD-10-CM | POA: Insufficient documentation

## 2015-09-19 DIAGNOSIS — F17210 Nicotine dependence, cigarettes, uncomplicated: Secondary | ICD-10-CM | POA: Insufficient documentation

## 2015-09-19 DIAGNOSIS — Z8619 Personal history of other infectious and parasitic diseases: Secondary | ICD-10-CM | POA: Insufficient documentation

## 2015-09-19 DIAGNOSIS — J449 Chronic obstructive pulmonary disease, unspecified: Secondary | ICD-10-CM | POA: Insufficient documentation

## 2015-09-19 LAB — URINE MICROSCOPIC-ADD ON

## 2015-09-19 LAB — URINALYSIS, ROUTINE W REFLEX MICROSCOPIC
BILIRUBIN URINE: NEGATIVE
GLUCOSE, UA: NEGATIVE mg/dL
KETONES UR: NEGATIVE mg/dL
Leukocytes, UA: NEGATIVE
Nitrite: NEGATIVE
PH: 6 (ref 5.0–8.0)
PROTEIN: NEGATIVE mg/dL
Specific Gravity, Urine: 1.017 (ref 1.005–1.030)

## 2015-09-19 LAB — BASIC METABOLIC PANEL
ANION GAP: 11 (ref 5–15)
BUN: 13 mg/dL (ref 6–20)
CO2: 24 mmol/L (ref 22–32)
Calcium: 9.6 mg/dL (ref 8.9–10.3)
Chloride: 101 mmol/L (ref 101–111)
Creatinine, Ser: 0.8 mg/dL (ref 0.44–1.00)
GFR calc Af Amer: >60 mL/min (ref >60)
GFR calc non Af Amer: >60 mL/min (ref >60)
Glucose, Bld: 126 mg/dL — ABNORMAL HIGH (ref 65–99)
POTASSIUM: 4.2 mmol/L (ref 3.5–5.1)
SODIUM: 136 mmol/L (ref 135–145)

## 2015-09-19 LAB — CBC
HEMATOCRIT: 46.1 % — AB (ref 36.0–46.0)
Hemoglobin: 15.1 g/dL — ABNORMAL HIGH (ref 12.0–15.0)
MCH: 29.2 pg (ref 26.0–34.0)
MCHC: 32.8 g/dL (ref 30.0–36.0)
MCV: 89 fL (ref 78.0–100.0)
Platelets: 319 10*3/uL (ref 150–400)
RBC: 5.18 MIL/uL — ABNORMAL HIGH (ref 3.87–5.11)
RDW: 15.2 % (ref 11.5–15.5)
WBC: 16.2 10*3/uL — AB (ref 4.0–10.5)

## 2015-09-19 LAB — INFLUENZA PANEL BY PCR (TYPE A & B)
H1N1 flu by pcr: NOT DETECTED
INFLAPCR: NEGATIVE
INFLBPCR: NEGATIVE

## 2015-09-19 LAB — D-DIMER, QUANTITATIVE: D-Dimer, Quant: 0.34 ug/mL-FEU (ref 0.00–0.50)

## 2015-09-19 LAB — I-STAT TROPONIN, ED: Troponin i, poc: 0.01 ng/mL (ref 0.00–0.08)

## 2015-09-19 MED ORDER — MORPHINE SULFATE (PF) 4 MG/ML IV SOLN
6.0000 mg | Freq: Once | INTRAVENOUS | Status: AC
  Administered 2015-09-19: 6 mg via INTRAVENOUS
  Filled 2015-09-19: qty 2

## 2015-09-19 MED ORDER — ONDANSETRON HCL 4 MG/2ML IJ SOLN
4.0000 mg | Freq: Once | INTRAMUSCULAR | Status: AC
  Administered 2015-09-19: 4 mg via INTRAVENOUS
  Filled 2015-09-19: qty 2

## 2015-09-19 MED ORDER — SODIUM CHLORIDE 0.9 % IV BOLUS (SEPSIS)
1000.0000 mL | Freq: Once | INTRAVENOUS | Status: AC
  Administered 2015-09-19: 1000 mL via INTRAVENOUS

## 2015-09-19 MED ORDER — LEVOFLOXACIN 500 MG PO TABS
500.0000 mg | ORAL_TABLET | Freq: Once | ORAL | Status: AC
  Administered 2015-09-19: 500 mg via ORAL
  Filled 2015-09-19: qty 1

## 2015-09-19 MED ORDER — ALBUTEROL SULFATE (2.5 MG/3ML) 0.083% IN NEBU
5.0000 mg | INHALATION_SOLUTION | Freq: Once | RESPIRATORY_TRACT | Status: AC
  Administered 2015-09-19: 5 mg via RESPIRATORY_TRACT
  Filled 2015-09-19: qty 6

## 2015-09-19 MED ORDER — OXYCODONE-ACETAMINOPHEN 5-325 MG PO TABS
1.0000 | ORAL_TABLET | Freq: Once | ORAL | Status: AC
  Administered 2015-09-19: 1 via ORAL
  Filled 2015-09-19: qty 1

## 2015-09-19 MED ORDER — IBUPROFEN 600 MG PO TABS: 600.0000 mg | ORAL_TABLET | Freq: Three times a day (TID) | ORAL | Status: DC | PRN

## 2015-09-19 MED ORDER — IBUPROFEN 200 MG PO TABS
600.0000 mg | ORAL_TABLET | Freq: Once | ORAL | Status: AC
  Administered 2015-09-19: 600 mg via ORAL
  Filled 2015-09-19: qty 3

## 2015-09-19 MED ORDER — LEVOFLOXACIN 500 MG PO TABS: 500.0000 mg | ORAL_TABLET | Freq: Every day | ORAL | Status: DC

## 2015-09-19 MED ORDER — IPRATROPIUM BROMIDE 0.02 % IN SOLN
0.5000 mg | Freq: Once | RESPIRATORY_TRACT | Status: AC
  Administered 2015-09-19: 0.5 mg via RESPIRATORY_TRACT
  Filled 2015-09-19: qty 2.5

## 2015-09-19 NOTE — ED Notes (Signed)
Pt left d/c papers in room. This RN brought d/c papers to pt in lobby. Pt states "I don't want those. I don't care about those." This RN explained pt needs to fulfill prescriptions and adhere to d/c instructions. Pt stated "fine" and let RN leave d/c papers sitting next to pt.

## 2015-09-19 NOTE — ED Provider Notes (Signed)
CSN: [id number]     Arrival date & time 09/19/15  0029 History   By signing my name below, I, Arianna Nassar, attest that this documentation has been prepared under the direction and in the presence of Azalia BilisKevin Kassidee Narciso, MD. Electronically Signed: Octavia HeirArianna Nassar, ED Scribe. 09/19/2015. 1:17 AM.      Chief Complaint  Patient presents with  . Chest Pain      The history is provided by the patient. No language interpreter was used.   HPI Comments: [name] is a 59 y.o. female who has a hx of COPD presents to the Emergency Department complaining of constant, moderate, acute onset, gradual worsening chest pain onset yesterday morning upon awakening. She notes having shortness of breath, nausea, headache, fever, minor back pain and dry heaves. Pt describes the pain dull that radiates down both lower extremities. Pt is unable to lay on her right side due to increased pain as well as breathing. Pt was told by her heart doctor that she does not get enough oxygen to her heart. She denies cough, vomiting, diarrhea, urinary frequency, and dysuria. Pt is a smoker.  Past Medical History  Diagnosis Date  . 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 Nonspecific abnormal results of thyroid function study   . Coronary atherosclerosis of unspecified type of vessel, native or graft   . Chronic airway obstruction, not elsewhere classified   . Mixed hyperlipidemia   . Chest pain, unspecified   . Carpal tunnel syndrome   . Disorders of bursae and tendons in shoulder region, unspecified   . Infective otitis externa, unspecified   . Other bursitis disorders   . Other symptoms involving nervous and musculoskeletal systems(781.99)   . Colitis   . Anemia    Past Surgical History  Procedure Laterality Date  . Abdominal hysterectomy    . Cholecystectomy    . Cesarean section    . Cardiac catheterization    . Tonsillectomy     Family History  Problem Relation Age of Onset  . Crohn's disease  Daughter [id number]    colon resection  . Irritable bowel syndrome Daughter   . Colonic polyp Mother   . Uterine cancer Maternal Grandmother   . Heart murmur Daughter     SVT  . Colon cancer Neg Hx   . Thyroid disease Mother    Social History  Substance Use Topics  . Smoking status: Light Tobacco Smoker    Last Attempt to Quit: 10/07/2011  . Smokeless tobacco: Never Used  . Alcohol Use: No   OB History    No data available     Review of Systems  A complete 10 system review of systems was obtained and all systems are negative except as noted in the HPI and PMH.    Allergies  Acetaminophen-codeine and Azithromycin  Home Medications   Prior to Admission medications   Medication Sig Start Date End Date Taking? Authorizing Provider  hydrOXYzine (ATARAX/VISTARIL) 25 MG tablet Take 1 tablet (25 mg total) by mouth every 6 (six) hours. Patient not taking: Reported on 02/24/2015 06/15/14   Jaynie Crumbleatyana Kirichenko, PA-C  oxyCODONE-acetaminophen (PERCOCET/ROXICET) 5-325 MG per tablet 1 or 2 tabs PO q6h prn pain Patient not taking: Reported on 09/19/2015 02/24/15   Samuel JesterKathleen McManus, DO  permethrin (ELIMITE) 5 % cream Apply neck down, leave on for 8 hrs. Repeat in 1 week. Patient not taking: Reported on 02/24/2015 06/15/14   Jaynie Crumbleatyana Kirichenko, PA-C   Triage vitals: BP  131/83 mmHg  Pulse 100  Temp(Src) 100 F (37.8 C) (Oral)  Resp 18  Ht  (1.549 m)  Wt 138 lb (62.596 kg)  BMI 26.09 kg/m2  SpO2 95% Physical Exam  Constitutional: She is oriented to person, place, and time. She appears well-developed and well-nourished. No distress.  HENT:  Head: Normocephalic and atraumatic.  Eyes: EOM are normal.  Neck: Normal range of motion.  Cardiovascular: Normal rate, regular rhythm and normal heart sounds.   Pulmonary/Chest: Effort normal.  Decreased breath sounds throughout  Abdominal: Soft. She exhibits no distension. There is no tenderness.  Musculoskeletal: Normal range of motion.  Neurological:  She is alert and oriented to person, place, and time.  Skin: Skin is warm and dry.  Psychiatric: She has a normal mood and affect. Judgment normal.  Nursing note and vitals reviewed.   ED Course  Procedures  DIAGNOSTIC STUDIES: Oxygen Saturation is 95% on RA, low by my interpretation.  COORDINATION OF CARE:  1:17 AM Discussed treatment plan which includes rectal temperature, CXR, lab work with pt at bedside and pt agreed to plan.  Labs Review Labs Reviewed  BASIC METABOLIC PANEL - Abnormal; Notable for the following:    Glucose, Bld 126 (*)    All other components within normal limits  CBC - Abnormal; Notable for the following:    WBC 16.2 (*)    RBC 5.18 (*)    Hemoglobin 15.1 (*)    HCT 46.1 (*)    All other components within normal limits  CULTURE, BLOOD (ROUTINE X 2)  CULTURE, BLOOD (ROUTINE X 2)  INFLUENZA PANEL BY PCR (TYPE A & B, H1N1)  D-DIMER, QUANTITATIVE (NOT AT [facility])  URINALYSIS, ROUTINE W REFLEX MICROSCOPIC (NOT AT [facility])  Rosezena Sensor, ED    Imaging Review Dg Chest 2 View  09/19/2015  CLINICAL DATA:  Acute onset of generalized chest pain. Initial encounter. EXAM: CHEST  2 VIEW COMPARISON:  Chest radiograph performed 02/24/2015 FINDINGS: The lungs are well-aerated. Mild bibasilar atelectasis is noted. There is no evidence of pleural effusion or pneumothorax. The heart is normal in size; the mediastinal contour is within normal limits. No acute osseous abnormalities are seen. Clips are noted within the right upper quadrant, reflecting prior cholecystectomy. IMPRESSION: Mild bibasilar atelectasis noted.  Lungs otherwise clear. Electronically Signed   By: Roanna Raider M.D.   On: 09/19/2015 01:22   I have personally reviewed and evaluated these images and lab results as part of my medical decision-making.   EKG Interpretation   Date/Time:  Tuesday September 19 2015 00:36:49 EST Ventricular Rate:  101 PR Interval:  125 QRS Duration: 81 QT Interval:  324 QTC  Calculation: 420 R Axis:   -35 Text Interpretation:  Sinus tachycardia Left axis deviation Abnormal  R-wave progression, early transition No significant change was found  Confirmed by Tommie Bohlken  MD, Khalilah Hoke ([id number]) on 09/19/2015 1:18:03 AM      MDM   Final diagnoses:  Chest pain, unspecified chest pain type   Patient with low-grade fever on arrival as well as productive cough.  She's having a lot of pleuritic pain.  This could be pleurisy from a developing pneumonia.  Patient be placed on Levaquin.  Discharge home with anti-inflammatories.  Primary care follow-up.  She understands to return to the ER for new or worsening symptoms   I personally performed the services described in this documentation, which was scribed in my presence. The recorded information has been reviewed and is accurate.  Azalia Bilis, MD 09/19/15 [phone]

## 2015-09-19 NOTE — ED Notes (Signed)
Patient transported to X-ray 

## 2015-09-19 NOTE — ED Notes (Signed)
Pt to ED from home by EMS c/o generalized chest pain onset at 1900. Pt cool and clammy for EMS. They gave 4mg  Zofran and 324mg  Asa. Pt refused nitro. CBG 122.

## 2015-09-24 LAB — CULTURE, BLOOD (ROUTINE X 2)
Culture: NO GROWTH
Culture: NO GROWTH

## 2016-06-24 ENCOUNTER — Encounter (HOSPITAL_COMMUNITY): Payer: Self-pay

## 2016-06-24 ENCOUNTER — Observation Stay (HOSPITAL_COMMUNITY)
Admission: AD | Admit: 2016-06-24 | Discharge: 2016-06-25 | Disposition: A | Discharge status: Home / Self Care | Payer: Self-pay | Source: Ambulatory Visit | Attending: Cardiovascular Disease | Admitting: Cardiovascular Disease

## 2016-06-24 DIAGNOSIS — Z859 Personal history of malignant neoplasm, unspecified: Secondary | ICD-10-CM | POA: Insufficient documentation

## 2016-06-24 DIAGNOSIS — I509 Heart failure, unspecified: Secondary | ICD-10-CM | POA: Insufficient documentation

## 2016-06-24 DIAGNOSIS — F419 Anxiety disorder, unspecified: Secondary | ICD-10-CM | POA: Insufficient documentation

## 2016-06-24 DIAGNOSIS — E782 Mixed hyperlipidemia: Secondary | ICD-10-CM | POA: Diagnosis present

## 2016-06-24 DIAGNOSIS — I2510 Atherosclerotic heart disease of native coronary artery without angina pectoris: Secondary | ICD-10-CM | POA: Insufficient documentation

## 2016-06-24 DIAGNOSIS — F17210 Nicotine dependence, cigarettes, uncomplicated: Secondary | ICD-10-CM | POA: Insufficient documentation

## 2016-06-24 DIAGNOSIS — E119 Type 2 diabetes mellitus without complications: Secondary | ICD-10-CM | POA: Insufficient documentation

## 2016-06-24 DIAGNOSIS — F17200 Nicotine dependence, unspecified, uncomplicated: Secondary | ICD-10-CM

## 2016-06-24 DIAGNOSIS — I110 Hypertensive heart disease with heart failure: Secondary | ICD-10-CM | POA: Insufficient documentation

## 2016-06-24 DIAGNOSIS — I2584 Coronary atherosclerosis due to calcified coronary lesion: Secondary | ICD-10-CM | POA: Insufficient documentation

## 2016-06-24 DIAGNOSIS — B1920 Unspecified viral hepatitis C without hepatic coma: Secondary | ICD-10-CM | POA: Insufficient documentation

## 2016-06-24 DIAGNOSIS — R079 Chest pain, unspecified: Principal | ICD-10-CM | POA: Diagnosis present

## 2016-06-24 DIAGNOSIS — J449 Chronic obstructive pulmonary disease, unspecified: Secondary | ICD-10-CM | POA: Insufficient documentation

## 2016-06-24 LAB — COMPREHENSIVE METABOLIC PANEL
ALBUMIN: 3.8 g/dL (ref 3.5–5.0)
ALT: 26 U/L (ref 14–54)
ANION GAP: 10 (ref 5–15)
AST: 27 U/L (ref 15–41)
Alkaline Phosphatase: 98 U/L (ref 38–126)
BILIRUBIN TOTAL: 0.4 mg/dL (ref 0.3–1.2)
BUN: 7 mg/dL (ref 6–20)
CHLORIDE: 106 mmol/L (ref 101–111)
CO2: 25 mmol/L (ref 22–32)
Calcium: 9.6 mg/dL (ref 8.9–10.3)
Creatinine, Ser: 0.74 mg/dL (ref 0.44–1.00)
GFR calc Af Amer: >60 mL/min (ref >60)
GFR calc non Af Amer: >60 mL/min (ref >60)
GLUCOSE: 117 mg/dL — AB (ref 65–99)
POTASSIUM: 3.9 mmol/L (ref 3.5–5.1)
Sodium: 141 mmol/L (ref 135–145)
TOTAL PROTEIN: 7.1 g/dL (ref 6.5–8.1)

## 2016-06-24 LAB — CBC WITH DIFFERENTIAL/PLATELET
Basophils Absolute: 0 10*3/uL (ref 0.0–0.1)
Basophils Relative: 1 %
EOS ABS: 0.1 10*3/uL (ref 0.0–0.7)
EOS PCT: 1 %
HCT: 43.5 % (ref 36.0–46.0)
Hemoglobin: 14.1 g/dL (ref 12.0–15.0)
Lymphocytes Relative: 27 %
Lymphs Abs: 2.3 10*3/uL (ref 0.7–4.0)
MCH: 30.5 pg (ref 26.0–34.0)
MCHC: 32.4 g/dL (ref 30.0–36.0)
MCV: 94.2 fL (ref 78.0–100.0)
Monocytes Absolute: 0.5 10*3/uL (ref 0.1–1.0)
Monocytes Relative: 6 %
NEUTROS PCT: 65 %
Neutro Abs: 5.6 10*3/uL (ref 1.7–7.7)
PLATELETS: 387 10*3/uL (ref 150–400)
RBC: 4.62 MIL/uL (ref 3.87–5.11)
RDW: 14.6 % (ref 11.5–15.5)
WBC: 8.6 10*3/uL (ref 4.0–10.5)

## 2016-06-24 LAB — TROPONIN I: Troponin I: <0.03 ng/mL (ref <0.03)

## 2016-06-24 LAB — MRSA PCR SCREENING: MRSA by PCR: NEGATIVE

## 2016-06-24 LAB — HEPARIN LEVEL (UNFRACTIONATED): HEPARIN UNFRACTIONATED: 0.35 [IU]/mL (ref 0.30–0.70)

## 2016-06-24 MED ORDER — ASPIRIN 81 MG PO CHEW
324.0000 mg | CHEWABLE_TABLET | ORAL | Status: AC
  Administered 2016-06-24: 324 mg via ORAL
  Filled 2016-06-24: qty 4

## 2016-06-24 MED ORDER — HEPARIN (PORCINE) IN NACL 100-0.45 UNIT/ML-% IJ SOLN
850.0000 [IU]/h | INTRAMUSCULAR | Status: DC
  Filled 2016-06-24: qty 250

## 2016-06-24 MED ORDER — HYDROMORPHONE HCL 1 MG/ML IJ SOLN
1.0000 mg | Freq: Once | INTRAMUSCULAR | Status: AC
  Administered 2016-06-24: 1 mg via INTRAVENOUS
  Filled 2016-06-24: qty 1

## 2016-06-24 MED ORDER — NITROGLYCERIN 0.4 MG SL SUBL: 0.4000 mg | SUBLINGUAL_TABLET | SUBLINGUAL | Status: DC | PRN

## 2016-06-24 MED ORDER — ASPIRIN 300 MG RE SUPP: 300.0000 mg | RECTAL | Status: AC

## 2016-06-24 MED ORDER — PRAVASTATIN SODIUM 20 MG PO TABS: 10.0000 mg | ORAL_TABLET | Freq: Every day | ORAL | Status: DC

## 2016-06-24 MED ORDER — SODIUM CHLORIDE 0.9% FLUSH: 3.0000 mL | Freq: Two times a day (BID) | INTRAVENOUS | Status: DC

## 2016-06-24 MED ORDER — METOPROLOL TARTRATE 12.5 MG HALF TABLET
12.5000 mg | ORAL_TABLET | Freq: Two times a day (BID) | ORAL | Status: DC
  Administered 2016-06-24: 12.5 mg via ORAL
  Filled 2016-06-24 (×2): qty 1

## 2016-06-24 MED ORDER — HEPARIN BOLUS VIA INFUSION
3000.0000 [IU] | Freq: Once | INTRAVENOUS | Status: AC
Start: 2016-06-24 — End: 2016-06-24
  Administered 2016-06-24: 3000 [IU] via INTRAVENOUS
  Filled 2016-06-24: qty 3000

## 2016-06-24 MED ORDER — SODIUM CHLORIDE 0.9 % IV SOLN
INTRAVENOUS | Status: DC
  Administered 2016-06-24 – 2016-06-25 (×2): via INTRAVENOUS

## 2016-06-24 MED ORDER — ASPIRIN EC 81 MG PO TBEC
81.0000 mg | DELAYED_RELEASE_TABLET | Freq: Every day | ORAL | Status: DC
  Administered 2016-06-25: 81 mg via ORAL
  Filled 2016-06-24 (×2): qty 1

## 2016-06-24 MED ORDER — ONDANSETRON HCL 4 MG/2ML IJ SOLN
4.0000 mg | Freq: Four times a day (QID) | INTRAMUSCULAR | Status: DC | PRN
  Administered 2016-06-24: 4 mg via INTRAVENOUS
  Filled 2016-06-24: qty 2

## 2016-06-24 MED ORDER — SODIUM CHLORIDE 0.9 % IV SOLN: 250.0000 mL | INTRAVENOUS | Status: DC | PRN

## 2016-06-24 MED ORDER — SODIUM CHLORIDE 0.9% FLUSH: 3.0000 mL | INTRAVENOUS | Status: DC | PRN

## 2016-06-24 MED ORDER — OXYCODONE HCL 5 MG PO TABS
5.0000 mg | ORAL_TABLET | Freq: Four times a day (QID) | ORAL | Status: DC | PRN
  Administered 2016-06-24 – 2016-06-25 (×4): 5 mg via ORAL
  Filled 2016-06-24 (×4): qty 1

## 2016-06-24 MED ORDER — PANTOPRAZOLE SODIUM 40 MG PO TBEC
40.0000 mg | DELAYED_RELEASE_TABLET | Freq: Every day | ORAL | Status: DC
  Administered 2016-06-24 – 2016-06-25 (×2): 40 mg via ORAL
  Filled 2016-06-24 (×2): qty 1

## 2016-06-24 MED ORDER — GI COCKTAIL ~~LOC~~
30.0000 mL | Freq: Once | ORAL | Status: AC
  Administered 2016-06-24: 30 mL via ORAL
  Filled 2016-06-24: qty 30

## 2016-06-24 NOTE — Progress Notes (Signed)
ANTICOAGULATION CONSULT NOTE  Pharmacy Consult for heparin Indication: chest pain/ACS   Assessment: 60 year old female direct admit from cardiology office for chest pain over the weekend after running out of pain medicine. She is not on anticoagulation prior to admission. Baseline labs are currently being drawn. Pharmacy asked to start IV heparin for rule out ACS.  Heparin level this evening is therapeutic at 0.35. No bleeding noted.  Goal of Therapy:  Heparin level 0.3-0.7 units/ml Monitor platelets by anticoagulation protocol: Yes   Plan:  Continue heparin infusion at 850 units/hr Check confirmatory anti-Xa level in 6 hours and daily while on heparin Continue to monitor H&H and platelets  Allergies  Allergen Reactions  . Acetaminophen-Codeine     Hand get red, feels like it's on fire  . Morphine And Related     "makes me feel like i'm on fire"  . Azithromycin Itching and Rash    Patient Measurements: Height: 5\' 1"  (154.9 cm) Weight: 144 lb 4.8 oz (65.5 kg) IBW/kg (Calculated) : 47.8 Heparin Dosing Weight: 60kg  Vital Signs: Temp: 98.1 F (36.7 C) (09/11 2016) Temp Source: Oral (09/11 2016) BP: 124/67 (09/11 2016) Pulse Rate: 60 (09/11 2016)  Labs:  Recent Labs  06/24/16 1142 06/24/16 1841 06/24/16 1952  HGB 14.1  --   --   HCT 43.5  --   --   PLT 387  --   --   HEPARINUNFRC  --   --  0.35  CREATININE 0.74  --   --   TROPONINI <0.03 <0.03  --     Estimated Creatinine Clearance: 64.8 mL/min (by C-G formula based on SCr of 0.8 mg/dL).   Medical History: Past Medical History:  Diagnosis Date  . 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 Anemia   . Carpal tunnel syndrome   . Chest pain, unspecified   . Chronic airway obstruction, not elsewhere classified   . Colitis   . Coronary atherosclerosis of unspecified type of vessel, native or graft   . Disorders of bursae and tendons in shoulder region, unspecified   . Infective otitis externa,  unspecified   . Mixed hyperlipidemia   . Nonspecific abnormal results of thyroid function study   . Other bursitis disorders   . Other symptoms involving nervous and musculoskeletal systems(781.99)     Medications:  See med history  Thank you for allowing us to participate in this patients care. Signe Coltonya C Emberlee Sortino, PharmD Pager: [phone] 06/24/2016 8:35 PM

## 2016-06-24 NOTE — H&P (Signed)
Referring Physician:  SAFA DERNER is an 60 y.o. female.                       Chief Complaint: Chest pain  HPI: 60 year old female with past medical history of CAD, tobacco use disorder, Hepatitis C and Anxiety has chest pain since 4 AM. Chest pain is precordial, heaviness like elephant sitting on chest with shortness of breath. No fever, cough or sweating spell.  Past Medical History:  Diagnosis Date  . 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 Anemia   . Carpal tunnel syndrome   . Chest pain, unspecified   . Chronic airway obstruction, not elsewhere classified   . Colitis   . Coronary atherosclerosis of unspecified type of vessel, native or graft   . Disorders of bursae and tendons in shoulder region, unspecified   . Infective otitis externa, unspecified   . Mixed hyperlipidemia   . Nonspecific abnormal results of thyroid function study   . Other bursitis disorders   . Other symptoms involving nervous and musculoskeletal systems(781.99)       Past Surgical History:  Procedure Laterality Date  . ABDOMINAL HYSTERECTOMY    . CARDIAC CATHETERIZATION    . CESAREAN SECTION    . CHOLECYSTECTOMY    . TONSILLECTOMY      Family History  Problem Relation Age of Onset  . Crohn's disease Daughter 31    colon resection  . Irritable bowel syndrome Daughter   . Colonic polyp Mother   . Uterine cancer Maternal Grandmother   . Heart murmur Daughter     SVT  . Colon cancer Neg Hx   . Thyroid disease Mother    Social History:  reports that she has been smoking.  She has never used smokeless tobacco. She reports that she does not drink alcohol or use drugs.  Allergies:  Allergies  Allergen Reactions  . Acetaminophen-Codeine     Hand get red   . Azithromycin Itching and Rash    Medications Prior to Admission  Medication Sig Dispense Refill  . ibuprofen (ADVIL,MOTRIN) 600 MG tablet Take 1 tablet (600 mg total) by mouth every 8 (eight) hours as needed. 15 tablet 0   . levofloxacin (LEVAQUIN) 500 MG tablet Take 1 tablet (500 mg total) by mouth daily. 7 tablet 0    No results found for this or any previous visit (from the past 48 hour(s)). No results found.  Review Of Systems Constitutional: No fever or chills. No weight gain or loss. Respiratory: COPD, Shortness of breath Cardiovascular: Chest pain, shortness of breath. Some ankle edema. Gastrointestinal: No GI bleed, Occasional nausea. No diarrhea.  Genitourinary: No kidney stone. Occasional dysuria. Neurology: Occasional headache. No seizures or stroke. Hematology/Immunology: Hepatitis C. Skin: No rash Musculoskeletal: Chronic back pain, No arthritis.  There were no vitals taken for this visit. General appearance: alert, cooperative, appears stated age and mild distress. Head: Normocephalic, atraumatic. Eyes: conjunctivae/corneas clear. PERRL, EOM's intact. Fundi benign. Neck: no adenopathy, no carotid bruit, no JVD, supple, symmetrical, trachea midline and thyroid not enlarged. Resp: clear to auscultation bilaterally.  Chest wall: no tenderness Cardio: regular rate and rhythm, S1, S2 normal, II/VI systolic murmur. No click, rub or gallop GI: soft, non-tender; bowel sounds normal; no masses,  no organomegaly Extremities: extremities normal, atraumatic, no cyanosis. Trace bilateral ankle edema. Skin: Warm and dry. Neurologic: Alert and oriented X 3, normal strength and tone. Normal coordination and gait.  Assessment/Plan Chest pain r/o MI CAD Tobacco use disorder H/O hepatitis-C  Place in observation/R/O MI.  Ricki RodriguezKADAKIA,Cederic Mozley S, MD  06/24/2016, 11:32 AM

## 2016-06-24 NOTE — Progress Notes (Signed)
Pt c/o pain and nausea RN paged MD received orders for Oxy 5mg  Q6 Prn. RN administered pain medication, with no relief pain still 8/10. RN paged MD for additional orders. 1x dose 1mg  of IV dilaudid ordered.

## 2016-06-24 NOTE — Progress Notes (Signed)
Pt complains of 8/10 chest pain.  PRN Nitro offered to Pt. Pt refused. MD aware and is coming to see Pt. GI cocktail was ordered. Will continue to monitor.

## 2016-06-24 NOTE — Progress Notes (Signed)
ANTICOAGULATION CONSULT NOTE - Initial Consult  Pharmacy Consult for heparin Indication: chest pain/ACS   Assessment: 60 year old female direct admit from cardiology office for chest pain over the weekend after running out of pain medicine. She is not on anticoagulation prior to admission. Baseline labs are currently being drawn. Pharmacy asked to start IV heparin for rule out ACS.  Goal of Therapy:  Heparin level 0.3-0.7 units/ml Monitor platelets by anticoagulation protocol: Yes   Plan:  Give 3000 units bolus x 1 Start heparin infusion at 850 units/hr Check anti-Xa level in 6 hours and daily while on heparin Continue to monitor H&H and platelets  Allergies  Allergen Reactions  . Acetaminophen-Codeine     Hand get red, feels like it's on fire  . Morphine And Related     "makes me feel like i'm on fire"  . Azithromycin Itching and Rash    Patient Measurements: Height: 5\' 1"  (154.9 cm) Weight: 144 lb 4.8 oz (65.5 kg) IBW/kg (Calculated) : 47.8 Heparin Dosing Weight: 60kg  Vital Signs: Temp: 98.2 F (36.8 C) (09/11 1147) Temp Source: Oral (09/11 1147)  Labs: No results for input(s): HGB, HCT, PLT, APTT, LABPROT, INR, HEPARINUNFRC, HEPRLOWMOCWT, CREATININE, CKTOTAL, CKMB, TROPONINI in the last 72 hours.  CrCl cannot be calculated (Patient's most recent lab result is older than the maximum 21 days allowed.).   Medical History: Past Medical History:  Diagnosis Date  . 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 Anemia   . Carpal tunnel syndrome   . Chest pain, unspecified   . Chronic airway obstruction, not elsewhere classified   . Colitis   . Coronary atherosclerosis of unspecified type of vessel, native or graft   . Disorders of bursae and tendons in shoulder region, unspecified   . Infective otitis externa, unspecified   . Mixed hyperlipidemia   . Nonspecific abnormal results of thyroid function study   . Other bursitis disorders   . Other symptoms  involving nervous and musculoskeletal systems(781.99)     Medications:  See med history  Sheppard CoilFrank Wilson PharmD., BCPS Clinical Pharmacist Pager [phone] 06/24/2016 12:00 PM

## 2016-06-25 ENCOUNTER — Encounter (HOSPITAL_COMMUNITY): Payer: Self-pay | Admitting: Cardiovascular Disease

## 2016-06-25 ENCOUNTER — Encounter (HOSPITAL_COMMUNITY)
Admission: AD | Disposition: A | Discharge status: Home / Self Care | Payer: Self-pay | Source: Ambulatory Visit | Attending: Cardiovascular Disease

## 2016-06-25 HISTORY — PX: CARDIAC CATHETERIZATION: SHX172

## 2016-06-25 LAB — CBC
HEMATOCRIT: 38.8 % (ref 36.0–46.0)
HEMOGLOBIN: 12.2 g/dL (ref 12.0–15.0)
MCH: 30 pg (ref 26.0–34.0)
MCHC: 31.4 g/dL (ref 30.0–36.0)
MCV: 95.3 fL (ref 78.0–100.0)
Platelets: 324 10*3/uL (ref 150–400)
RBC: 4.07 MIL/uL (ref 3.87–5.11)
RDW: 14.9 % (ref 11.5–15.5)
WBC: 8.7 10*3/uL (ref 4.0–10.5)

## 2016-06-25 LAB — LIPID PANEL
CHOL/HDL RATIO: 4.3 ratio
Cholesterol: 192 mg/dL (ref 0–200)
HDL: 45 mg/dL (ref >40)
LDL CALC: 130 mg/dL — AB (ref 0–99)
Triglycerides: 83 mg/dL (ref <150)
VLDL: 17 mg/dL (ref 0–40)

## 2016-06-25 LAB — CARDIAC CATHETERIZATION: Cath EF Quantitative: 60 %

## 2016-06-25 LAB — PROTIME-INR
INR: 1.07
PROTHROMBIN TIME: 13.9 s (ref 11.4–15.2)

## 2016-06-25 LAB — BASIC METABOLIC PANEL
ANION GAP: 7 (ref 5–15)
BUN: 11 mg/dL (ref 6–20)
CO2: 26 mmol/L (ref 22–32)
Calcium: 8.4 mg/dL — ABNORMAL LOW (ref 8.9–10.3)
Chloride: 106 mmol/L (ref 101–111)
Creatinine, Ser: 0.79 mg/dL (ref 0.44–1.00)
GFR calc Af Amer: >60 mL/min (ref >60)
GLUCOSE: 100 mg/dL — AB (ref 65–99)
POTASSIUM: 4.1 mmol/L (ref 3.5–5.1)
Sodium: 139 mmol/L (ref 135–145)

## 2016-06-25 LAB — POCT ACTIVATED CLOTTING TIME: Activated Clotting Time: 109 seconds

## 2016-06-25 LAB — HEPARIN LEVEL (UNFRACTIONATED): Heparin Unfractionated: 0.35 IU/mL (ref 0.30–0.70)

## 2016-06-25 SURGERY — LEFT HEART CATH AND CORONARY ANGIOGRAPHY
Anesthesia: LOCAL

## 2016-06-25 MED ORDER — LIDOCAINE HCL (PF) 1 % IJ SOLN
INTRAMUSCULAR | Status: AC
  Filled 2016-06-25: qty 30

## 2016-06-25 MED ORDER — SODIUM CHLORIDE 0.9% FLUSH: 3.0000 mL | INTRAVENOUS | Status: DC | PRN

## 2016-06-25 MED ORDER — FENTANYL CITRATE (PF) 100 MCG/2ML IJ SOLN
INTRAMUSCULAR | Status: AC
  Filled 2016-06-25: qty 2

## 2016-06-25 MED ORDER — IOPAMIDOL (ISOVUE-370) INJECTION 76%
INTRAVENOUS | Status: DC | PRN
  Administered 2016-06-25: 50 mL via INTRA_ARTERIAL

## 2016-06-25 MED ORDER — ASPIRIN 81 MG PO TBEC: 81.0000 mg | DELAYED_RELEASE_TABLET | Freq: Every day | ORAL | 1 refills | Status: DC

## 2016-06-25 MED ORDER — PRAVASTATIN SODIUM 10 MG PO TABS: 10.0000 mg | ORAL_TABLET | Freq: Every day | ORAL | 1 refills | Status: DC

## 2016-06-25 MED ORDER — OXYCODONE-ACETAMINOPHEN 5-325 MG PO TABS: 1.0000 | ORAL_TABLET | Freq: Every day | ORAL | 0 refills | Status: DC | PRN

## 2016-06-25 MED ORDER — IOPAMIDOL (ISOVUE-370) INJECTION 76%
INTRAVENOUS | Status: AC
  Filled 2016-06-25: qty 100

## 2016-06-25 MED ORDER — HEPARIN (PORCINE) IN NACL 2-0.9 UNIT/ML-% IJ SOLN
INTRAMUSCULAR | Status: AC
  Filled 2016-06-25: qty 1000

## 2016-06-25 MED ORDER — SODIUM CHLORIDE 0.9% FLUSH: 3.0000 mL | Freq: Two times a day (BID) | INTRAVENOUS | Status: DC

## 2016-06-25 MED ORDER — MIDAZOLAM HCL 2 MG/2ML IJ SOLN
INTRAMUSCULAR | Status: DC | PRN
  Administered 2016-06-25: 1 mg via INTRAVENOUS

## 2016-06-25 MED ORDER — METOPROLOL TARTRATE 25 MG PO TABS: 12.5000 mg | ORAL_TABLET | Freq: Two times a day (BID) | ORAL | 1 refills | Status: DC

## 2016-06-25 MED ORDER — NITROGLYCERIN 0.4 MG SL SUBL: 0.4000 mg | SUBLINGUAL_TABLET | SUBLINGUAL | 1 refills | Status: DC | PRN

## 2016-06-25 MED ORDER — SODIUM CHLORIDE 0.9 % IV SOLN: INTRAVENOUS | Status: AC

## 2016-06-25 MED ORDER — PANTOPRAZOLE SODIUM 40 MG PO TBEC: 40.0000 mg | DELAYED_RELEASE_TABLET | Freq: Every day | ORAL | 1 refills | Status: DC

## 2016-06-25 MED ORDER — HEPARIN (PORCINE) IN NACL 2-0.9 UNIT/ML-% IJ SOLN
INTRAMUSCULAR | Status: DC | PRN
  Administered 2016-06-25: 1000 mL

## 2016-06-25 MED ORDER — MIDAZOLAM HCL 2 MG/2ML IJ SOLN
INTRAMUSCULAR | Status: AC
  Filled 2016-06-25: qty 2

## 2016-06-25 MED ORDER — LIDOCAINE HCL (PF) 1 % IJ SOLN
INTRAMUSCULAR | Status: DC | PRN
  Administered 2016-06-25: 15 mL

## 2016-06-25 MED ORDER — FENTANYL CITRATE (PF) 100 MCG/2ML IJ SOLN
INTRAMUSCULAR | Status: DC | PRN
  Administered 2016-06-25: 25 ug via INTRAVENOUS

## 2016-06-25 MED ORDER — SODIUM CHLORIDE 0.9 % IV SOLN: 250.0000 mL | INTRAVENOUS | Status: DC | PRN

## 2016-06-25 SURGICAL SUPPLY — 9 items
CATH INFINITI 5FR ANG PIGTAIL (CATHETERS) ×2 IMPLANT
CATH INFINITI 5FR JL4 (CATHETERS) ×2 IMPLANT
CATH INFINITI JR4 5F (CATHETERS) ×2 IMPLANT
KIT HEART LEFT (KITS) ×2 IMPLANT
PACK CARDIAC CATHETERIZATION (CUSTOM PROCEDURE TRAY) ×2 IMPLANT
SHEATH PINNACLE 5F 10CM (SHEATH) ×2 IMPLANT
SYR MEDRAD MARK V 150ML (SYRINGE) ×2 IMPLANT
TRANSDUCER W/STOPCOCK (MISCELLANEOUS) ×2 IMPLANT
WIRE EMERALD 3MM-J .035X150CM (WIRE) ×2 IMPLANT

## 2016-06-25 NOTE — Discharge Summary (Signed)
Physician Discharge Summary  Patient ID: [name] MRN: [id number] DOB/AGE: 1955-12-01 60 y.o.  Admit date: 06/24/2016 Discharge date: 06/25/2016  Admission Diagnoses: Chest pain CAD Tobacco use disorder H/O hepatitis C.  Discharge Diagnoses:  Principle Problem: *Chest pain* Active Problems:   HYPERLIPIDEMIA, MIXED, WITH LOW HDL   Tobacco use disorder   H/O hepatitis C  Discharged Condition: fair  Hospital Course: 60 year old female with hyperlipidemia and tobacco use disorder has typical angina and known history of coronary artery disease. She underwent cardiac catheterization showing non obstructive disease with good LV systolic function. Her medications were adjusted and she was discharged home with follow up by me in 1 week.  Consults: cardiology  Significant Diagnostic Studies: labs: Normal CBC, Troponin I and BMET. Mildly elevated blood sugar and LDL cholesterol of 130 mg/dL.  Treatments: cardiac meds: Aspirin, metoprolol and Pravastatin.  Discharge Exam: Blood pressure (!) 96/50, pulse (!) 55, temperature 98.1 F (36.7 C), temperature source Oral, resp. rate 13, height 5\' 1"  (1.549 m), weight 65.5 kg (144 lb 4.8 oz), SpO2 97 %. General appearance: alert, cooperative and appears stated age Head: Normocephalic, atraumatic. Neck: no adenopathy, no carotid bruit, no JVD, supple, symmetrical, trachea midline and thyroid not enlarged. Resp: clear to auscultation bilaterally Cardio: regular rate and rhythm, S1, S2 normal, II/VI systolic murmur, No click, rub or gallop. GI: soft, non-tender; bowel sounds normal; no masses,  no organomegaly Extremities: extremities normal, no cyanosis or edema. No right groin hematoma. Skin: Warm and dry. No right groin hemaSkin color, texture, turgor normal. No rashes or lesions Neurologic: Alert and oriented X 3, normal strength and tone. Normal coordination and gait  Disposition: 01-Home or Self Care     Medication List    STOP  taking these medications   ibuprofen 600 MG tablet Commonly known as:  ADVIL,MOTRIN   levofloxacin 500 MG tablet Commonly known as:  LEVAQUIN     TAKE these medications   aspirin 81 MG EC tablet Take 1 tablet (81 mg total) by mouth daily.   LORazepam 1 MG tablet Commonly known as:  ATIVAN Take 1 mg by mouth daily as needed for anxiety.   metoprolol tartrate 25 MG tablet Commonly known as:  LOPRESSOR Take 0.5 tablets (12.5 mg total) by mouth 2 (two) times daily. What changed:  medication strength  how much to take   nitroGLYCERIN 0.4 MG SL tablet Commonly known as:  NITROSTAT Place 1 tablet (0.4 mg total) under the tongue every 5 (five) minutes x 3 doses as needed for chest pain.   oxyCODONE-acetaminophen 5-325 MG tablet Commonly known as:  PERCOCET/ROXICET Take 1 tablet by mouth daily as needed for severe pain. What changed:  when to take this   pantoprazole 40 MG tablet Commonly known as:  PROTONIX Take 1 tablet (40 mg total) by mouth daily.   pravastatin 10 MG tablet Commonly known as:  PRAVACHOL Take 1 tablet (10 mg total) by mouth daily at 6 PM.      Follow-up Information    [facility] Center[facility]AD[facility],Krystelle Prashad S, MD. Schedule an appointment as soon as possible for a visit in 1 week(s).   Specialty:  Cardiology Contact information: [address] [place] [address] [phone]           Signed: Ricki RodriguezKADAKIA,Pernie Grosso S 06/25/2016, 1:42 PM

## 2016-06-25 NOTE — Progress Notes (Signed)
Site area: Right groin a 5 french arterial sheath was removed  Site Prior to Removal:  Level 0  Pressure Applied For 15 MINUTES    Bedrest Beginning at 0840a  Manual:   Yes.    Patient Status During Pull:  stable  Post Pull Groin Site:  Level 0  Post Pull Instructions Given:  Yes.    Post Pull Pulses Present:  Yes.    Dressing Applied:  Yes.    Comments:  VS remain stable at this time.

## 2016-06-25 NOTE — Interval H&P Note (Signed)
History and Physical Interval Note:  06/25/2016 7:33 AM  [name]  has presented today for surgery, with the diagnosis of Chest pain  The various methods of treatment have been discussed with the patient and family. After consideration of risks, benefits and other options for treatment, the patient has consented to  Procedure(s): Left Heart Cath and Coronary Angiography (N/A) as a surgical intervention .  The patient's history has been reviewed, patient examined, no change in status, stable for surgery.  I have reviewed the patient's chart and labs.  Questions were answered to the patient's satisfaction.     Siomara Burkel S

## 2017-02-12 ENCOUNTER — Emergency Department (HOSPITAL_COMMUNITY)
Admission: EM | Admit: 2017-02-12 | Discharge: 2017-02-12 | Disposition: A | Discharge status: Home / Self Care | Payer: Self-pay | Attending: Emergency Medicine | Admitting: Emergency Medicine

## 2017-02-12 ENCOUNTER — Emergency Department (HOSPITAL_COMMUNITY): Payer: Self-pay

## 2017-02-12 ENCOUNTER — Encounter (HOSPITAL_COMMUNITY): Payer: Self-pay | Admitting: Emergency Medicine

## 2017-02-12 DIAGNOSIS — Z79899 Other long term (current) drug therapy: Secondary | ICD-10-CM | POA: Insufficient documentation

## 2017-02-12 DIAGNOSIS — F17210 Nicotine dependence, cigarettes, uncomplicated: Secondary | ICD-10-CM | POA: Insufficient documentation

## 2017-02-12 DIAGNOSIS — Z7982 Long term (current) use of aspirin: Secondary | ICD-10-CM | POA: Insufficient documentation

## 2017-02-12 DIAGNOSIS — R079 Chest pain, unspecified: Secondary | ICD-10-CM | POA: Insufficient documentation

## 2017-02-12 DIAGNOSIS — J449 Chronic obstructive pulmonary disease, unspecified: Secondary | ICD-10-CM | POA: Insufficient documentation

## 2017-02-12 LAB — CBC WITH DIFFERENTIAL/PLATELET
Basophils Absolute: 0 10*3/uL (ref 0.0–0.1)
Basophils Relative: 0 %
EOS ABS: 0 10*3/uL (ref 0.0–0.7)
Eosinophils Relative: 0 %
HEMATOCRIT: 40.5 % (ref 36.0–46.0)
HEMOGLOBIN: 13.6 g/dL (ref 12.0–15.0)
LYMPHS ABS: 1.4 10*3/uL (ref 0.7–4.0)
LYMPHS PCT: 18 %
MCH: 30.4 pg (ref 26.0–34.0)
MCHC: 33.6 g/dL (ref 30.0–36.0)
MCV: 90.6 fL (ref 78.0–100.0)
Monocytes Absolute: 0.5 10*3/uL (ref 0.1–1.0)
Monocytes Relative: 7 %
NEUTROS ABS: 5.6 10*3/uL (ref 1.7–7.7)
NEUTROS PCT: 75 %
Platelets: 339 10*3/uL (ref 150–400)
RBC: 4.47 MIL/uL (ref 3.87–5.11)
RDW: 14 % (ref 11.5–15.5)
WBC: 7.6 10*3/uL (ref 4.0–10.5)

## 2017-02-12 LAB — I-STAT CHEM 8, ED
BUN: 7 mg/dL (ref 6–20)
CALCIUM ION: 1.07 mmol/L — AB (ref 1.15–1.40)
CHLORIDE: 103 mmol/L (ref 101–111)
Creatinine, Ser: 0.6 mg/dL (ref 0.44–1.00)
Glucose, Bld: 129 mg/dL — ABNORMAL HIGH (ref 65–99)
HCT: 41 % (ref 36.0–46.0)
Hemoglobin: 13.9 g/dL (ref 12.0–15.0)
Potassium: 3.6 mmol/L (ref 3.5–5.1)
SODIUM: 138 mmol/L (ref 135–145)
TCO2: 24 mmol/L (ref 0–100)

## 2017-02-12 LAB — I-STAT TROPONIN, ED: Troponin i, poc: 0 ng/mL (ref 0.00–0.08)

## 2017-02-12 MED ORDER — KETOROLAC TROMETHAMINE 30 MG/ML IJ SOLN
30.0000 mg | Freq: Once | INTRAMUSCULAR | Status: AC
  Administered 2017-02-12: 30 mg via INTRAVENOUS
  Filled 2017-02-12: qty 1

## 2017-02-12 MED ORDER — REGADENOSON 0.4 MG/5ML IV SOLN
0.4000 mg | Freq: Once | INTRAVENOUS | Status: AC
  Administered 2017-02-12: 0.4 mg via INTRAVENOUS

## 2017-02-12 MED ORDER — REGADENOSON 0.4 MG/5ML IV SOLN
INTRAVENOUS | Status: AC
  Administered 2017-02-12: 0.4 mg via INTRAVENOUS
  Filled 2017-02-12: qty 5

## 2017-02-12 MED ORDER — TECHNETIUM TC 99M TETROFOSMIN IV KIT
10.0000 | PACK | Freq: Once | INTRAVENOUS | Status: AC | PRN
  Administered 2017-02-12: 10 via INTRAVENOUS

## 2017-02-12 MED ORDER — OXYCODONE HCL 5 MG PO TABS
5.0000 mg | ORAL_TABLET | Freq: Once | ORAL | Status: AC
  Administered 2017-02-12: 5 mg via ORAL
  Filled 2017-02-12: qty 1

## 2017-02-12 MED ORDER — ACETAMINOPHEN 325 MG PO TABS
650.0000 mg | ORAL_TABLET | Freq: Once | ORAL | Status: AC
  Administered 2017-02-12: 650 mg via ORAL
  Filled 2017-02-12: qty 2

## 2017-02-12 MED ORDER — TECHNETIUM TC 99M TETROFOSMIN IV KIT
30.0000 | PACK | Freq: Once | INTRAVENOUS | Status: AC | PRN
  Administered 2017-02-12: 30 via INTRAVENOUS

## 2017-02-12 NOTE — ED Provider Notes (Signed)
[facility]EMERGENCY DEPT Provider Note   CSN: [id number] Arrival date & time: 02/12/17  0448     History   Chief Complaint Chief Complaint  Patient presents with  . Chest Pain    HPI [name] is a 61 y.o. female. Chief complaint is chest pain  HPI: This is a 61 year old female who presents with chest pain for 4 weeks. She is lifting [place]. She moved to [place]. She still comes back to [facility] to see her cardiologist Dr. Algie Coffer.  She states that she's had chest pain every day for the last 4 weeks. Per her description sounds like she's had pain for a long time. She states she takes oxycodone for pain. She states "he is trying to wean me off". She states she normally takes 1" at bedtime or in the morning". Pain is bilateral anterior chest and bilateral shoulders. She was given aspirin and nitroglycerin en route by paramedics and states that it was minimally better.  She saw Dr. Algie Coffer on Monday. She states that she was telling him about the pain was. Per her description he told her "you need to go to the emergency room". She had a heart cath done 9/17 that showed less than 30% lesions and nonocclusive disease without a culprit lesion.  Past Medical History:  Diagnosis Date  . Acute hepatitis C without mention of hepatic coma(070.51)   . Anemia   . Carpal tunnel syndrome   . Chest pain, unspecified   . Chronic airway obstruction, not elsewhere classified   . Colitis   . Coronary atherosclerosis of unspecified type of vessel, native or graft   . Disorders of bursae and tendons in shoulder region, unspecified   . Infective otitis externa, unspecified   . Mixed hyperlipidemia   . Nonspecific abnormal results of thyroid function study   . Other bursitis disorders   . Other symptoms involving nervous and musculoskeletal systems(781.99)     Patient Active Problem List   Diagnosis Date Noted  . Tobacco use disorder 06/24/2016  . Weight gain 01/02/2012  . Stool incontinence  11/19/2011  . Abdominal pain, right lower quadrant 11/19/2011  . EUSTACHIAN TUBE DYSFUNCTION 08/10/2009  . SINUSITIS, ACUTE 08/10/2009  . TRANSIENT GLOBAL AMNESIA 01/12/2009  . HIP PAIN, RIGHT 10/04/2008  . HEPATITIS C 07/28/2008  . COPD, 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  . HYPERLIPIDEMIA, MIXED, WITH LOW HDL 08/30/2007  . OTITIS EXTERNA, INFECTIVE NOS 08/27/2007  . ROTATOR CUFF SYNDROME, RIGHT 08/27/2007  . Chest pain at rest 08/27/2007  . BURSITIS NOS 07/13/2007  . MUSCULOSKELETAL PAIN 07/13/2007  . CARPAL TUNNEL SYNDROME, BILATERAL 04/10/2007    Past Surgical History:  Procedure Laterality Date  . ABDOMINAL HYSTERECTOMY    . CARDIAC CATHETERIZATION    . CARDIAC CATHETERIZATION N/A 06/25/2016   Procedure: Left Heart Cath and Coronary Angiography;  Surgeon: Orpah Cobb, MD;  Location: [facility] INVASIVE CV [facility];  Service: Cardiovascular;  Laterality: N/A;  . CESAREAN SECTION    . CHOLECYSTECTOMY    . TONSILLECTOMY      OB History    No data available       Home Medications    Prior to Admission medications   Medication Sig Start Date End Date Taking? Authorizing Provider  aspirin EC 81 MG EC tablet Take 1 tablet (81 mg total) by mouth daily. 06/25/16  Yes Orpah Cobb, MD  esomeprazole (NEXIUM) 40 MG capsule Take 40 mg by mouth 2 (two) times daily before a meal.   Yes Historical Provider, MD  LORazepam (ATIVAN)  1 MG tablet Take 1 mg by mouth daily as needed for anxiety.   Yes Historical Provider, MD  metoprolol tartrate (LOPRESSOR) 25 MG tablet Take 0.5 tablets (12.5 mg total) by mouth 2 (two) times daily. 06/25/16  Yes Orpah Cobb, MD  oxyCODONE (OXY IR/ROXICODONE) 5 MG immediate release tablet Take 5 mg by mouth at bedtime as needed. 02/10/17  Yes Historical Provider, MD  nitroGLYCERIN (NITROSTAT) 0.4 MG SL tablet Place 1 tablet (0.4 mg total) under the tongue every 5 (five) minutes x 3 doses as needed for chest pain. 06/25/16   Orpah Cobb, MD  oxyCODONE-acetaminophen (PERCOCET/ROXICET) 5-325 MG  tablet Take 1 tablet by mouth daily as needed for severe pain. Patient not taking: Reported on 02/12/2017 06/25/16   Orpah Cobb, MD  pantoprazole (PROTONIX) 40 MG tablet Take 1 tablet (40 mg total) by mouth daily. Patient not taking: Reported on 02/12/2017 06/25/16   Orpah Cobb, MD  pravastatin (PRAVACHOL) 10 MG tablet Take 1 tablet (10 mg total) by mouth daily at 6 PM. Patient not taking: Reported on 02/12/2017 06/25/16   Orpah Cobb, MD    Family History Family History  Problem Relation Age of Onset  . Crohn's disease Daughter 36    colon resection  . Irritable bowel syndrome Daughter   . Colonic polyp Mother   . Thyroid disease Mother   . Uterine cancer Maternal Grandmother   . Heart murmur Daughter     SVT  . Colon cancer Neg Hx     Social History Social History  Substance Use Topics  . Smoking status: Current Every Day Smoker    Types: Cigarettes    Last attempt to quit: 10/07/2011  . Smokeless tobacco: Never Used     Comment: 2 cigarettes a day  . Alcohol use No     Allergies   Acetaminophen-codeine; Morphine and related; and Azithromycin   Review of Systems Review of Systems  Constitutional: Negative for appetite change, chills, diaphoresis, fatigue and fever.  HENT: Negative for mouth sores, sore throat and trouble swallowing.   Eyes: Negative for visual disturbance.  Respiratory: Negative for cough, chest tightness, shortness of breath and wheezing.   Cardiovascular: Positive for chest pain.  Gastrointestinal: Negative for abdominal distention, abdominal pain, diarrhea, nausea and vomiting.  Endocrine: Negative for polydipsia, polyphagia and polyuria.  Genitourinary: Negative for dysuria, frequency and hematuria.  Musculoskeletal: Negative for gait problem.  Skin: Negative for color change, pallor and rash.  Neurological: Negative for dizziness, syncope, light-headedness and headaches.  Hematological: Does not bruise/bleed easily.  Psychiatric/Behavioral:  Negative for behavioral problems and confusion.     Physical Exam Updated Vital Signs BP 116/67 (BP Location: Right Arm)   Pulse 100 Comment: test end  Temp 98.3 F (36.8 C) (Oral)   Resp 17   Ht  (1.549 m)   Wt 150 lb (68 kg)   SpO2 98%   BMI 28.34 kg/m   Physical Exam  Constitutional: She is oriented to person, place, and time. She appears well-developed and well-nourished. No distress.  HENT:  Head: Normocephalic.  Eyes: Conjunctivae are normal. Pupils are equal, round, and reactive to light. No scleral icterus.  Neck: Normal range of motion. Neck supple. No thyromegaly present.  Cardiovascular: Normal rate and regular rhythm.  Exam reveals no gallop and no friction rub.   No murmur heard. Pulmonary/Chest: Effort normal and breath sounds normal. No respiratory distress. She has no wheezes. She has no rales.  Abdominal: Soft. Bowel sounds are normal. She  exhibits no distension. There is no tenderness. There is no rebound.  Musculoskeletal: Normal range of motion.  Neurological: She is alert and oriented to person, place, and time.  Skin: Skin is warm and dry. No rash noted.  Psychiatric: She has a normal mood and affect. Her behavior is normal.     ED Treatments / Results  Labs (all labs ordered are listed, but only abnormal results are displayed) Labs Reviewed  I-STAT CHEM 8, ED - Abnormal; Notable for the following:       Result Value   Glucose, Bld 129 (*)    Calcium, Ion 1.07 (*)    All other components within normal limits  CBC WITH DIFFERENTIAL/PLATELET  LIPID PANEL  I-STAT TROPOININ, ED    EKG  EKG Interpretation  Date/Time:  Wednesday Feb 12 2017 04:53:41 EDT Ventricular Rate:  94 PR Interval:    QRS Duration: 97 QT Interval:  371 QTC Calculation: 464 R Axis:   -15 Text Interpretation:  Sinus rhythm Probable left ventricular hypertrophy Confirmed by Palumbo, April ([id number]) on 02/12/2017 4:56:32 AM       Radiology Dg Chest 2 View  Result  Date: 02/12/2017 CLINICAL DATA:  Chest pain and dyspnea tonight. EXAM: CHEST  2 VIEW COMPARISON:  09/19/2015 FINDINGS: The lungs are clear. The pulmonary vasculature is normal. Heart size is normal. Hilar and mediastinal contours are unremarkable. There is no pleural effusion. IMPRESSION: No active cardiopulmonary disease. Electronically Signed   By: Ellery Plunk M.D.   On: 02/12/2017 05:30    Procedures Procedures (including critical care time)  Medications Ordered in ED Medications  ketorolac (TORADOL) 30 MG/ML injection 30 mg (30 mg Intravenous Given 02/12/17 0719)  acetaminophen (TYLENOL) tablet 650 mg (650 mg Oral Given 02/12/17 0719)  oxyCODONE (Oxy IR/ROXICODONE) immediate release tablet 5 mg (5 mg Oral Given 02/12/17 0930)  regadenoson (LEXISCAN) injection SOLN 0.4 mg (0.4 mg Intravenous Given 02/12/17 1213)  technetium tetrofosmin (TC-MYOVIEW) injection 10 millicurie (10 millicuries Intravenous Contrast Given 02/12/17 1025)  technetium tetrofosmin (TC-MYOVIEW) injection 30 millicurie (30 millicuries Intravenous Contrast Given 02/12/17 1232)     Initial Impression / Assessment and Plan / ED Course  I have reviewed the triage vital signs and the nursing notes.  Pertinent labs & imaging results that were available during my care of the patient were reviewed by me and considered in my medical decision making (see chart for details).     EKG shows no acute ischemic changes. First troponin normal. Discussed with her that I would prefer to not treat chronic pain with narcotics and she was offered Toradol and Tylenol. I will discuss patient with her cardiologist Dr. Algie Coffer.  Final Clinical Impressions(s) / ED Diagnoses   Final diagnoses:  Chest pain, unspecified type     Patient seen and evaluated by her cardiologist. Nuclear medicine scan ordered. This was normal per her cardiologist report. She is discharged to follow-up with Dr. Algie Coffer.   New Prescriptions Current Discharge  Medication List       Rolland Porter, MD 02/12/17 1446

## 2017-02-12 NOTE — ED Notes (Signed)
Pt in NM during hourly rounding

## 2017-02-12 NOTE — ED Triage Notes (Signed)
Patient arrived to ED from home by [facility]. EMS reports: Patient visiting family - from [place]. Reports chest pain which radiates down bilateral arms x 4 weeks. Rates pain 10/10.  EMS administered ASA 324 mg & NTG x 3. Patient anxious.  BP 113/75, Pulse 123, 97% on room air. CBG 155.  EKG unremarkable.

## 2017-02-12 NOTE — Progress Notes (Signed)
No reversible ischemia with EF67 % on stress test. May discharge home with follow up in 1 month as previously arranged.  Orpah Cobb, MD

## 2017-02-12 NOTE — ED Notes (Signed)
Referring Physician: Orpah Cobb, MD  [name] is an 61 y.o. female.                       Chief Complaint: Chest pain  HPI: 61 years old female is here with recurrent chest pain. Her cardiac cath in 06/2016 showed mild coronary artery disease. Chest pain is increased by deep breath and palpation. She denies fever or cough.   Past Medical History:  Diagnosis Date  . Acute hepatitis C without mention of hepatic coma(070.51)   . Anemia   . Carpal tunnel syndrome   . Chest pain, unspecified   . Chronic airway obstruction, not elsewhere classified   . Colitis   . Coronary atherosclerosis of unspecified type of vessel, native or graft   . Disorders of bursae and tendons in shoulder region, unspecified   . Infective otitis externa, unspecified   . Mixed hyperlipidemia   . Nonspecific abnormal results of thyroid function study   . Other bursitis disorders   . Other symptoms involving nervous and musculoskeletal systems(781.99)       Past Surgical History:  Procedure Laterality Date  . ABDOMINAL HYSTERECTOMY    . CARDIAC CATHETERIZATION    . CARDIAC CATHETERIZATION N/A 06/25/2016   Procedure: Left Heart Cath and Coronary Angiography;  Surgeon: Orpah Cobb, MD;  Location: [facility];  Service: Cardiovascular;  Laterality: N/A;  . CESAREAN SECTION    . CHOLECYSTECTOMY    . TONSILLECTOMY      Family History  Problem Relation Age of Onset  . Crohn's disease Daughter 5    colon resection  . Irritable bowel syndrome Daughter   . Colonic polyp Mother   . Thyroid disease Mother   . Uterine cancer Maternal Grandmother   . Heart murmur Daughter     SVT  . Colon cancer Neg Hx    Social History:  reports that she has been smoking Cigarettes.  She has never used smokeless tobacco. She reports that she does not drink alcohol or use drugs.  Allergies:  Allergies  Allergen Reactions  . Acetaminophen-Codeine     Hand get red, feels like it's on fire  . Morphine And  Related     "makes me feel like i'm on fire"  . Azithromycin Itching and Rash     (Not in a hospital admission)  Results for orders placed or performed during the hospital encounter of 02/12/17 (from the past 48 hour(s))  CBC with Differential/Platelet     Status: None   Collection Time: 02/12/17  5:03 AM  Result Value Ref Range   WBC 7.6 4.0 - 10.5 K/uL   RBC 4.47 3.87 - 5.11 MIL/uL   Hemoglobin 13.6 12.0 - 15.0 g/dL   HCT 16.1 09.6 - 04.5 %   MCV 90.6 78.0 - 100.0 fL   MCH 30.4 26.0 - 34.0 pg   MCHC 33.6 30.0 - 36.0 g/dL   RDW 40.9 81.1 - 91.4 %   Platelets 339 150 - 400 K/uL   Neutrophils Relative % 75 %   Neutro Abs 5.6 1.7 - 7.7 K/uL   Lymphocytes Relative 18 %   Lymphs Abs 1.4 0.7 - 4.0 K/uL   Monocytes Relative 7 %   Monocytes Absolute 0.5 0.1 - 1.0 K/uL   Eosinophils Relative 0 %   Eosinophils Absolute 0.0 0.0 - 0.7 K/uL   Basophils Relative 0 %   Basophils Absolute 0.0 0.0 - 0.1 K/uL  I-Stat Chem  8, ED     Status: Abnormal   Collection Time: 02/12/17  5:05 AM  Result Value Ref Range   Sodium 138 135 - 145 mmol/L   Potassium 3.6 3.5 - 5.1 mmol/L   Chloride 103 101 - 111 mmol/L   BUN 7 6 - 20 mg/dL   Creatinine, Ser 1.19 0.44 - 1.00 mg/dL   Glucose, Bld 147 (H) 65 - 99 mg/dL   Calcium, Ion 8.29 (L) 1.15 - 1.40 mmol/L   TCO2 24 0 - 100 mmol/L   Hemoglobin 13.9 12.0 - 15.0 g/dL   HCT 56.2 13.0 - 86.5 %  I-stat troponin, ED     Status: None   Collection Time: 02/12/17  7:16 AM  Result Value Ref Range   Troponin i, poc 0.00 0.00 - 0.08 ng/mL   Comment 3            Comment: Due to the release kinetics of cTnI, a negative result within the first hours of the onset of symptoms does not rule out myocardial infarction with certainty. If myocardial infarction is still suspected, repeat the test at appropriate intervals.    Dg Chest 2 View  Result Date: 02/12/2017 CLINICAL DATA:  Chest pain and dyspnea tonight. EXAM: CHEST  2 VIEW COMPARISON:  09/19/2015  FINDINGS: The lungs are clear. The pulmonary vasculature is normal. Heart size is normal. Hilar and mediastinal contours are unremarkable. There is no pleural effusion. IMPRESSION: No active cardiopulmonary disease. Electronically Signed   By: Ellery Plunk M.D.   On: 02/12/2017 05:30    Review Of Systems Constitutional: No fever, chills, weight loss or gain. Eyes: No vision change, wears glasses. No discharge or pain. Ears: No hearing loss, No tinnitus. Respiratory: No asthma, COPD, pneumonias. Occasiional shortness of breath. No hemoptysis. Cardiovascular: Occasional chest pain, palpitation, no leg edema. Gastrointestinal: No nausea, vomiting, diarrhea, constipation. No GI bleed. No hepatitis. Genitourinary: No dysuria, hematuria, kidney stone. No incontinance. Neurological: No headache, stroke, seizures.  Psychiatry: No psych facility admission for anxiety, depression, suicide. No detox. Skin: No rash. Musculoskeletal: Occasional joint pain, no fibromyalgia. Occasional neck pain, back pain. Lymphadenopathy: No lymphadenopathy. Hematology: No anemia or easy bruising.   Blood pressure (!) 147/82, pulse 84, temperature 98.3 F (36.8 C), temperature source Oral, resp. rate 17, height  (1.549 m), weight 68 kg (150 lb), SpO2 97 %. Body mass index is 28.34 kg/m. General appearance: alert, cooperative, appears stated age and no distress Head: Normocephalic, atraumatic. Eyes: Hazel eyes, pink conjunctiva, corneas clear. PERRL, EOM's intact. Neck: No adenopathy, no carotid bruit, no JVD, supple, symmetrical, trachea midline and thyroid not enlarged. Resp: Clear to auscultation bilaterally. Chest wall tender on palpation. Cardio: Regular rate and rhythm, S1, S2 normal, II/VI systolic murmur, no click, rub or gallop GI: Soft, non-tender; bowel sounds normal; no organomegaly. Extremities: No edema, cyanosis or clubbing. Skin: Warm and dry.  Neurologic: Alert and oriented X 3, normal  strength. Normal coordination and gait.  Assessment/Plan Chest pain CAD Tobacco use disorder H/O of hepatitis C  Nuclear stress test.  Analgesic as needed. Decrease smoking to quit asap.  Ricki Rodriguez, MD  02/12/2017, 9:09 AM

## 2017-02-12 NOTE — Discharge Instructions (Signed)
Continue your follow-up with Dr. Algie Coffer as scheduled.

## 2017-02-12 NOTE — ED Notes (Signed)
Patient transported to X-ray 

## 2017-12-23 ENCOUNTER — Ambulatory Visit (INDEPENDENT_AMBULATORY_CARE_PROVIDER_SITE_OTHER): Payer: Self-pay

## 2017-12-23 ENCOUNTER — Encounter (INDEPENDENT_AMBULATORY_CARE_PROVIDER_SITE_OTHER): Payer: Self-pay | Admitting: Orthopaedic Surgery

## 2017-12-23 ENCOUNTER — Ambulatory Visit (INDEPENDENT_AMBULATORY_CARE_PROVIDER_SITE_OTHER): Payer: Self-pay | Admitting: Orthopaedic Surgery

## 2017-12-23 DIAGNOSIS — M545 Low back pain, unspecified: Secondary | ICD-10-CM | POA: Insufficient documentation

## 2017-12-23 MED ORDER — PREDNISONE 10 MG (21) PO TBPK: ORAL_TABLET | ORAL | 0 refills | Status: DC

## 2017-12-23 MED ORDER — METHOCARBAMOL 500 MG PO TABS: 500.0000 mg | ORAL_TABLET | Freq: Four times a day (QID) | ORAL | 0 refills | Status: DC

## 2017-12-23 NOTE — Progress Notes (Signed)
Mri

## 2017-12-23 NOTE — Progress Notes (Signed)
Office Visit Note   Patient: [name]           Date of Birth: [birth year]           MRN: [id number] Visit Date: 12/23/2017              Requested by: No referring provider defined for this encounter. PCP: Patient, No Pcp Per   Assessment & Plan: Visit Diagnoses:  1. Low back pain, unspecified back pain laterality, unspecified chronicity, with sciatica presence unspecified     Plan: At this point, we feel it is appropriate to repeat the lumbar MRI as we have no records of this.  We will have her follow-up with [place] once this is completed.  We will then likely refer her to Dr. Alvester Morin for epidural steroid injections.  In the meantime, we will write her for a steroid Dosepak as well as muscle relaxers.  I have counseled her on the need to stop any other anti-inflammatories while on the steroid Dosepak.  Of note, we suspect drug-seeking behavior from this patient.  According to the [facility] records, she has been getting multiple prescriptions of narcotic pain medication from multiple providers throughout [place] and [place].  She will call if concerns or questions in the meantime.  Follow-Up Instructions: Return in about 10 days (around 01/02/2018) for after MRI.   Orders:  Orders Placed This Encounter  Procedures  . XR Lumbar Spine 2-3 Views  . MR Lumbar Spine w/o contrast   Meds ordered this encounter  Medications  . predniSONE (STERAPRED UNI-PAK 21 TAB) 10 MG (21) TBPK tablet    Sig: Take as directed    Dispense:  21 tablet    Refill:  0  . methocarbamol (ROBAXIN) 500 MG tablet    Sig: Take 1 tablet (500 mg total) by mouth 4 (four) times daily.    Dispense:  30 tablet    Refill:  0      Procedures: No procedures performed   Clinical Data: No additional findings.   Subjective: Chief Complaint  Patient presents with  . Lower Back - Pain    HPI [name] is a 62 year old new patient who presents to our clinic today with right sided buttocks pain radiating down the  entire posterior aspect of the right leg and into her foot.  This began on 11/30/2017 without any known injury or change in activity.  Her pain is progressively worsened.  She was in [place] visiting her brother when this occurred, and she was taken to the emergency department.  She says that x-rays as well as an MRI were obtained.  They gave her steroids as well as muscle relaxers which have been of no help.  She denies any use of narcotics over the past few years.  She notes constant pain to the above area worse with moving.  She admits to numbness to the right foot.  No history of epidural steroid injections or surgical intervention on the back.  No previous back pain noted.  No bowel or bladder incontinence.  No saddle paresthesias.  Review of Systems as detailed in HPI.  All others reviewed and are negative.   Objective: Vital Signs: There were no vitals taken for this visit.  Physical Exam well-developed well-nourished female in no acute distress.  Alert and oriented x3.  Ortho Exam examination of her lumbar spine reveals pain at the sciatic notch.  No spinous or paraspinous tenderness.  She does have increased pain with lumbar flexion.  Positive straight leg raise.  No weakness with EHL FHL, knee or hip flexion.  Specialty Comments:  No specialty comments available.  Imaging: Xr Lumbar Spine 2-3 Views  Result Date: 12/23/2017 X-rays of the lumbar spine reveal marked degenerative changes L3-4 L4-5 with a retrolisthesis at L5    PMFS History: Patient Active Problem List   Diagnosis Date Noted  . Low back pain 12/23/2017  . Tobacco use disorder 06/24/2016  . Weight gain 01/02/2012  . Stool incontinence 11/19/2011  . Abdominal pain, right lower quadrant 11/19/2011  . EUSTACHIAN TUBE DYSFUNCTION 08/10/2009  . SINUSITIS, ACUTE 08/10/2009  . TRANSIENT GLOBAL AMNESIA 01/12/2009  . HIP PAIN, RIGHT 10/04/2008  . HEPATITIS C 07/28/2008  . COPD, 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  . HYPERLIPIDEMIA, MIXED,  WITH LOW HDL 08/30/2007  . OTITIS EXTERNA, INFECTIVE NOS 08/27/2007  . ROTATOR CUFF SYNDROME, RIGHT 08/27/2007  . Chest pain at rest 08/27/2007  . BURSITIS NOS 07/13/2007  . MUSCULOSKELETAL PAIN 07/13/2007  . CARPAL TUNNEL SYNDROME, BILATERAL 04/10/2007   Past Medical History:  Diagnosis Date  . Acute hepatitis C without mention of hepatic coma(070.51)   . Anemia   . Carpal tunnel syndrome   . Chest pain, unspecified   . Chronic airway obstruction, not elsewhere classified   . Colitis   . Coronary atherosclerosis of unspecified type of vessel, native or graft   . Disorders of bursae and tendons in shoulder region, unspecified   . Infective otitis externa, unspecified   . Mixed hyperlipidemia   . Nonspecific abnormal results of thyroid function study   . Other bursitis disorders   . Other symptoms involving nervous and musculoskeletal systems(781.99)     Family History  Problem Relation Age of Onset  . Crohn's disease Daughter 633       colon resection  . Irritable bowel syndrome Daughter   . Colonic polyp Mother   . Thyroid disease Mother   . Uterine cancer Maternal Grandmother   . Heart murmur Daughter        SVT  . Colon cancer Neg Hx     Past Surgical History:  Procedure Laterality Date  . ABDOMINAL HYSTERECTOMY    . CARDIAC CATHETERIZATION    . CARDIAC CATHETERIZATION N/A 06/25/2016   Procedure: Left Heart Cath and Coronary Angiography;  Surgeon: Orpah CobbAjay [name], MD;  Location: [facility];  Service: Cardiovascular;  Laterality: N/A;  . CESAREAN SECTION    . CHOLECYSTECTOMY    . TONSILLECTOMY     Social History   Occupational History  . Occupation: care giver  Tobacco Use  . Smoking status: Current Every Day Smoker    Types: Cigarettes    Last attempt to quit: 10/07/2011    Years since quitting: 6.2  . Smokeless tobacco: Never Used  . Tobacco comment: 2 cigarettes a day  Substance and Sexual Activity  . Alcohol use: No  . Drug use: No  . Sexual  activity: Yes    Partners: Male

## 2017-12-25 ENCOUNTER — Ambulatory Visit
Admission: RE | Admit: 2017-12-25 | Discharge: 2017-12-25 | Disposition: A | Discharge status: Home / Self Care | Payer: Self-pay | Source: Ambulatory Visit | Attending: Physician Assistant | Admitting: Physician Assistant

## 2017-12-25 DIAGNOSIS — M545 Low back pain: Secondary | ICD-10-CM

## 2017-12-26 ENCOUNTER — Other Ambulatory Visit: Payer: Self-pay | Admitting: Student

## 2017-12-26 ENCOUNTER — Encounter (INDEPENDENT_AMBULATORY_CARE_PROVIDER_SITE_OTHER): Payer: Self-pay | Admitting: Specialist

## 2017-12-26 ENCOUNTER — Ambulatory Visit (INDEPENDENT_AMBULATORY_CARE_PROVIDER_SITE_OTHER): Payer: Self-pay | Admitting: Specialist

## 2017-12-26 ENCOUNTER — Other Ambulatory Visit (INDEPENDENT_AMBULATORY_CARE_PROVIDER_SITE_OTHER): Payer: Self-pay | Admitting: Specialist

## 2017-12-26 VITALS — BP 120/68 | HR 55 | Ht 61.0 in | Wt 133.0 lb

## 2017-12-26 DIAGNOSIS — M5442 Lumbago with sciatica, left side: Secondary | ICD-10-CM

## 2017-12-26 DIAGNOSIS — M4316 Spondylolisthesis, lumbar region: Secondary | ICD-10-CM

## 2017-12-26 DIAGNOSIS — R509 Fever, unspecified: Secondary | ICD-10-CM

## 2017-12-26 DIAGNOSIS — M009 Pyogenic arthritis, unspecified: Secondary | ICD-10-CM

## 2017-12-26 DIAGNOSIS — M0088 Arthritis due to other bacteria, vertebrae: Secondary | ICD-10-CM

## 2017-12-26 MED ORDER — OXYCODONE HCL 5 MG PO TABS: 5.0000 mg | ORAL_TABLET | ORAL | 0 refills | Status: DC | PRN

## 2017-12-26 NOTE — Progress Notes (Addendum)
Office Visit Note   Patient: MARCINA KINNISON           Date of Birth: 15-Jul-1956           MRN: [id number] Visit Date: 12/26/2017              Requested by: No referring provider defined for this encounter. PCP: Patient, No Pcp Per   Assessment & Plan: Visit Diagnoses:  1. Acute right-sided low back pain with left-sided sciatica   2. Spondylolisthesis, lumbar region   3. Fever and chills   4. 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 She has a history of  Hepatitis C.  No treatment for chronic hepatitis, no diabetes. At onset of her worsening pain she was seen in the ER in [place] diagnosed with an acute UTI and lumbago with sciatica. MRI completed yesterday shows edema in the soft tissue about the right paralumbar spine at the lower lumbar and sacroiliac spine. Fluid and cysts are noted about the right L5-S1 facet. There is right S1 and L5 Nerve compression due to the effect of the cyst on the  Right L5-S1 lateral recess and the right L5 neuroforamen.  She describes a low grade fever with associated chills.  Here MRI is concerning for possible septic arthritis of the right L5-S1 facet vs inflamatory arthrosis changes.  Her pain is significant and clinically she is guarding with  ROM the right hip and palpation of the right L-S junction. I have drawn lab CBC with differential, CRP and Sed rate to assess for infection vs inflamation. I have requested a STAT Right L5-S1 facet aspiration for C&S and cell count and gram stain. Will provide adequate narcotics to control pain  While working up the arthritis changes in the right L5-S1 facet and plan to    Plan:Avoid frequent bending and stooping  No lifting greater than 10 lbs. May use ice or moist heat for  pain. Weight loss is of benefit.  Lab work today to decide if there is infection and a need to place you in the hospital for medication and pain control. If our narcotic do not relieve the pain then  Go to the ER for evaluation and probable admission for work up and treatment. Oxycodone IR 5 mg one tablet po every 4 hours per pain We will schedule for you to have an aspiration of the right L5-S1 facet cyst to r/o infection. Stop prednisone dose pak. No tylenol, may take motrin 400 mg every 6 hours for pain in addition to the  Oxycodone.     Follow-Up Instructions: Return in about 4 weeks (around 01/23/2018).   Orders:  No orders of the defined types were placed in this encounter.  No orders of the defined types were placed in this encounter.     Procedures: No procedures performed   Clinical Data: Findings:  MRI scan of the Lumbar spine from 12/25/2017 shows diffuse edema changes right paralumbar musculature at the L4-S1 level but predominantly right side. There is cysts noted posterior to the right L5-S1 facet and cyst extending into the spinal and subarticular lateral recess right L5-S1 that may compress the right L5 and S1 nerve roots. Grade one  spondylolisthesis L4-5 with severe facet arthrosis changes at L4-5 and  moderate subarticular lateral recess narrowing at this level. No pars defect noted.  Plain radiographs of the lumbar spine with moderate degenerative disc narrowing L3-4 and L4-5 with grade one spondylolisthesis L4-5. Spondylosis changes bilateral L4-5 and L5-S1. SI joints with minimal changes, hip joints with minimal changes. There is a thoracolumbar scoliosis apex right at L2-3 measuring 21 degrees from the lower pedicle of T11 to the inferior endplate of L4.    Subjective: Chief Complaint  Patient presents with  . Lower Back - Pain    62 year old female with low back pain, it has been present and worsening for one month, now with difficulty sleeping, reports  her 62 year old mom is helping her with her shoes daily. On a scale of 1-10, the pain is about a 10. She reports being very uncomfortable and is squirming all the time. She is "a Investment banker, corporatego getter", not having to take pain meds frequently like this until one month ago. She started with severe pain, while [place] her brother in [place] in [place] 11/25/2017. She started with a cramp getting out of the car into the right buttock. That night the pain became severe. The right leg is numb, this after about 7 days following the onset of the pain. [facility] came [facility] 2 weeks later and made an appointment with us and also saw Dr. Christell FaithSalama DC and after talking to him and determining that the cost was too great. Dr. Warren DanesXu's PA saw her last week.  MRI was done 3/15 and showed L5-S1  facet changes worrisome for inflamation vs septic arthritis. She was found to have a UTI when seen in the ER in [place], placed on IVF and IV antibiotics and a steroid injection and Tylenol #3.  Has been running a low grade fever 100 degrees and pain is severe and constant. History of hepatitis C diagnosed 30 years ago.She has been using a heating pad and sitting on the heating pad continuously for almost a month.  This patients use of narcotic pain medications and multiple providers in [place] and [place] is documented. She has been taking narcotic percocet from Dr. Algie CofferKadakia for nearly 9 years and takes 1/2-1 percocet twice a week. Dr. Algie CofferKadakia prescribes #15 percocet tablets every month. Since the onset of her back pain she is needing more narcotics to relieve the pain and reports that she received medications in the ER in [place]. I reviewed her profile on the [place] controlled substance website which shows the prescriptions and quantities to be consistent with her history and reflects an increase in her use of narcotics during the time frame associated with the recent worsening of her back pain.     Review of Systems  Constitutional:  Positive for activity change, fever and unexpected weight change. Negative for chills.  HENT: Negative for congestion, dental problem, drooling, ear discharge, ear pain, facial swelling, hearing loss, mouth sores, nosebleeds, postnasal drip, rhinorrhea, sinus pressure, sinus pain, sneezing, sore throat, tinnitus, trouble swallowing and voice change.   Eyes: Positive for visual disturbance. Negative for photophobia, pain, discharge, redness and itching.  Respiratory: Positive for apnea. Negative for cough, choking, chest tightness, shortness of breath, wheezing and stridor.   Cardiovascular: Negative.  Negative for chest pain, palpitations and leg swelling.  Gastrointestinal: Positive for abdominal pain. Negative for abdominal distention, anal bleeding, blood in stool and constipation.  Endocrine: Negative.  Negative for cold intolerance, heat intolerance, polydipsia, polyphagia and polyuria.  Genitourinary: Positive for difficulty urinating and  dysuria. Negative for dyspareunia, enuresis, flank pain, frequency, genital sores, hematuria, pelvic pain and urgency.  Musculoskeletal: Positive for back pain, gait problem, neck pain and neck stiffness. Negative for arthralgias, joint swelling and myalgias.  Skin: Negative.  Negative for color change, pallor, rash and wound.  Allergic/Immunologic: Negative.  Negative for environmental allergies, food allergies and immunocompromised state.  Neurological: Negative for dizziness, tremors, seizures, facial asymmetry, speech difficulty, light-headedness, numbness and headaches.  Hematological: Negative.   Psychiatric/Behavioral: Negative.      Objective: Vital Signs: BP 120/68 (BP Location: Left Arm, Patient Position: Sitting)   Pulse (!) 55   Ht 5\' 1"  (1.549 m)   Wt 133 lb (60.3 kg)   BMI 25.13 kg/m   Physical Exam  Constitutional: She is oriented to person, place, and time. She appears well-developed and well-nourished. No distress.  HENT:  Head:  Normocephalic and atraumatic.  Eyes: EOM are normal. Pupils are equal, round, and reactive to light.  Neck: Normal range of motion. Neck supple.  Pulmonary/Chest: Effort normal and breath sounds normal.  Abdominal: Soft. Bowel sounds are normal.  Neurological: She is alert and oriented to person, place, and time.  Skin: Skin is warm and dry. She is not diaphoretic.  Psychiatric: She has a normal mood and affect. Her behavior is normal. Judgment and thought content normal.    Back Exam   Tenderness  The patient is experiencing tenderness in the lumbar.  Range of Motion  Extension: abnormal  Flexion: abnormal  Lateral bend right: abnormal  Lateral bend left: abnormal  Rotation right: abnormal  Rotation left: abnormal   Muscle Strength  Right Quadriceps:  5/5  Left Quadriceps:  5/5  Right Hamstrings:  5/5  Left Hamstrings:  5/5   Tests  Straight leg raise right: negative Straight leg raise left: negative  Reflexes  Patellar: normal Achilles: normal Biceps: normal Babinski's sign: normal   Other  Toe walk: normal Heel walk: normal Sensation: normal Gait: normal  Erythema: no back redness  Comments:  Exquisitely tender right SI and right lower lumbar-lumbosacral junction. Pain with internal and external rotation of the right hip and with right hip flexion. Weak in right foot dorsiflexion 4/5 and right foot plantar flexion 5-/5. Sitting SLR is painful at 15 degrees short of full extension of the right knee.  Left leg motor and SLR are normal.       Specialty Comments:  No specialty comments available.  Imaging: Mr Lumbar Spine W/o Contrast  Result Date: 12/25/2017 CLINICAL DATA:  Low back pain radiating into the posterior right leg and foot since 11/30/2017. EXAM: MRI LUMBAR SPINE WITHOUT CONTRAST TECHNIQUE: Multiplanar, multisequence MR imaging of the lumbar spine was performed. No intravenous contrast was administered. COMPARISON:  Lumbar spine radiographs  12/23/2017 FINDINGS: Segmentation:  Standard. Alignment: Moderate lumbar dextroscoliosis. 4 mm degenerative anterolisthesis of L4 on L5. Trace retrolisthesis of L1 on L2 and L2 on L3. Vertebrae: Extensive marrow edema on both sides of the right L5-S1 facet joint with edema extending into the right L5 and S1 pedicles. There is less pronounced marrow edema about the right greater than left L4-5 facet joints. No definite fracture is identified. There is no evidence of infectious discitis. Conus medullaris and cauda equina: Conus extends to the lower T12 level. Conus and cauda equina appear normal. Paraspinal and other soft tissues: Marked edema within the musculature and other soft tissues in the lower lumbar spine on the right with multiple small synovial cysts or other fluid collections posterior  and inferior to the right L5-S1 facet joint, none individually measuring larger than 1.5 cm in size. Disc levels: Disc desiccation throughout the lumbar spine. Mild-to-moderate disc space narrowing from L2-3 to L4-5. T12-L1: Mild disc bulging without stenosis. L1-2: Minimal disc bulging and mild facet hypertrophy without stenosis. L3-4: Scratched at L2-3: Mild disc bulging greater to the left and mild right and mild-to-moderate left facet hypertrophy result in mild left neural foraminal stenosis without spinal stenosis. L3-4: Mild disc bulging and mild-to-moderate facet hypertrophy result in borderline left neural foraminal stenosis without spinal stenosis. L4-5: Anterolisthesis with disc uncovering and severe facet arthrosis result in mild spinal stenosis, mild bilateral lateral recess stenosis, and borderline right neural foraminal stenosis. L5-S1: Mild disc bulging, asymmetric severe right facet arthritis, and a small synovial cyst projecting anterior to the right facet joint result in moderate right neural foraminal stenosis with potential L5 nerve root impingement. Multiloculated cyst extending inferior to the right  facet joint slightly narrows the right S1 neural foramen without neural compression. Inflammatory changes extend into both the right L5 and S1 neural foramina and may irritate those nerves. No spinal stenosis. IMPRESSION: 1. Extensive marrow and soft tissue edema at the lumbosacral junction centered about the right L5-S1 facet joint. Both septic facet arthritis and acute noninfectious inflammatory facet arthritis are considerations. Potential right L5 nerve root impingement in the foramen and potential right L5 and S1 nerve root irritation by regional inflammation. 2. Mild multifactorial spinal stenosis at L4-5. These results will be called to the ordering clinician or representative by the Radiologist Assistant, and communication documented in the PACS or zVision Dashboard. Electronically Signed   By: Sebastian Ache M.D.   On: 12/25/2017 16:48     PMFS History: Patient Active Problem List   Diagnosis Date Noted  . Low back pain 12/23/2017  . Tobacco use disorder 06/24/2016  . Weight gain 01/02/2012  . Stool incontinence 11/19/2011  . Abdominal pain, right lower quadrant 11/19/2011  . EUSTACHIAN TUBE DYSFUNCTION 08/10/2009  . SINUSITIS, ACUTE 08/10/2009  . TRANSIENT GLOBAL AMNESIA 01/12/2009  . HIP PAIN, RIGHT 10/04/2008  . HEPATITIS C 07/28/2008  . COPD, 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  . HYPERLIPIDEMIA, MIXED, WITH LOW HDL 08/30/2007  . OTITIS EXTERNA, INFECTIVE NOS 08/27/2007  . ROTATOR CUFF SYNDROME, RIGHT 08/27/2007  . Chest pain at rest 08/27/2007  . BURSITIS NOS 07/13/2007  . MUSCULOSKELETAL PAIN 07/13/2007  . CARPAL TUNNEL SYNDROME, BILATERAL 04/10/2007   Past Medical History:  Diagnosis Date  . Acute hepatitis C without mention of hepatic coma(070.51)   . Anemia   . Carpal tunnel syndrome   . Chest pain, unspecified   . Chronic airway obstruction, not elsewhere classified   . Colitis   . Coronary atherosclerosis of unspecified type of vessel, native or graft   . Disorders of bursae and  tendons in shoulder region, unspecified   . Infective otitis externa, unspecified   . Mixed hyperlipidemia   . Nonspecific abnormal results of thyroid function study   . Other bursitis disorders   . Other symptoms involving nervous and musculoskeletal systems(781.99)     Family History  Problem Relation Age of Onset  . Crohn's disease Daughter 58       colon resection  . Irritable bowel syndrome Daughter   . Colonic polyp Mother   . Thyroid disease Mother   . Uterine cancer Maternal Grandmother   . Heart murmur Daughter        SVT  . Colon cancer Neg Hx  Past Surgical History:  Procedure Laterality Date  . ABDOMINAL HYSTERECTOMY    . CARDIAC CATHETERIZATION    . CARDIAC CATHETERIZATION N/A 06/25/2016   Procedure: Left Heart Cath and Coronary Angiography;  Surgeon: Orpah Cobb, MD;  Location: [facility];  Service: Cardiovascular;  Laterality: N/A;  . CESAREAN SECTION    . CHOLECYSTECTOMY    . TONSILLECTOMY     Social History   Occupational History  . Occupation: care giver  Tobacco Use  . Smoking status: Current Every Day Smoker    Types: Cigarettes    Last attempt to quit: 10/07/2011    Years since quitting: 6.2  . Smokeless tobacco: Never Used  . Tobacco comment: 2 cigarettes a day  Substance and Sexual Activity  . Alcohol use: No  . Drug use: No  . Sexual activity: Yes    Partners: Male

## 2017-12-26 NOTE — Patient Instructions (Addendum)
Avoid frequent bending and stooping  No lifting greater than 10 lbs. May use ice or moist heat for pain. Weight loss is of benefit.  Lab work today to decide if there is infection and a need to place you in the hospital for medication and pain control. If our narcotic do not relieve the pain then  Go to the ER for evaluation and probable admission for work up and treatment. Oxycodone IR 5 mg one tablet po every 4 hours per pain We will schedule for you to have an aspiration of the right L5-S1 facet cyst to r/o infection. Stop prednisone dose pak. No tylenol, may take motrin 400 mg every 6 hours for pain in addition to the  Oxycodone.

## 2017-12-27 ENCOUNTER — Ambulatory Visit (HOSPITAL_COMMUNITY)
Admission: RE | Admit: 2017-12-27 | Discharge: 2017-12-27 | Disposition: A | Discharge status: Home / Self Care | Payer: Self-pay | Source: Ambulatory Visit | Attending: Specialist | Admitting: Specialist

## 2017-12-27 ENCOUNTER — Encounter (HOSPITAL_COMMUNITY): Payer: Self-pay | Admitting: Radiology

## 2017-12-27 DIAGNOSIS — M4656 Other infective spondylopathies, lumbar region: Secondary | ICD-10-CM | POA: Insufficient documentation

## 2017-12-27 DIAGNOSIS — J449 Chronic obstructive pulmonary disease, unspecified: Secondary | ICD-10-CM | POA: Insufficient documentation

## 2017-12-27 DIAGNOSIS — F17210 Nicotine dependence, cigarettes, uncomplicated: Secondary | ICD-10-CM | POA: Insufficient documentation

## 2017-12-27 DIAGNOSIS — I2510 Atherosclerotic heart disease of native coronary artery without angina pectoris: Secondary | ICD-10-CM | POA: Insufficient documentation

## 2017-12-27 DIAGNOSIS — M009 Pyogenic arthritis, unspecified: Secondary | ICD-10-CM

## 2017-12-27 DIAGNOSIS — E782 Mixed hyperlipidemia: Secondary | ICD-10-CM | POA: Insufficient documentation

## 2017-12-27 DIAGNOSIS — M5442 Lumbago with sciatica, left side: Secondary | ICD-10-CM | POA: Insufficient documentation

## 2017-12-27 HISTORY — PX: IR FLUORO GUIDED NEEDLE PLC ASPIRATION/INJECTION LOC: IMG2395

## 2017-12-27 LAB — COMPREHENSIVE METABOLIC PANEL
AG Ratio: 1.3 (calc) (ref 1.0–2.5)
ALT: 9 U/L (ref 6–29)
AST: 11 U/L (ref 10–35)
Albumin: 3.9 g/dL (ref 3.6–5.1)
Alkaline phosphatase (APISO): 95 U/L (ref 33–130)
BUN: 18 mg/dL (ref 7–25)
CHLORIDE: 107 mmol/L (ref 98–110)
CO2: 28 mmol/L (ref 20–32)
CREATININE: 0.75 mg/dL (ref 0.50–0.99)
Calcium: 9.8 mg/dL (ref 8.6–10.4)
Globulin: 3 g/dL (calc) (ref 1.9–3.7)
Glucose, Bld: 103 mg/dL — ABNORMAL HIGH (ref 65–99)
Potassium: 5.3 mmol/L (ref 3.5–5.3)
Sodium: 142 mmol/L (ref 135–146)
Total Bilirubin: 0.2 mg/dL (ref 0.2–1.2)
Total Protein: 6.9 g/dL (ref 6.1–8.1)

## 2017-12-27 LAB — SYNOVIAL CELL COUNT + DIFF, W/ CRYSTALS
CRYSTALS FLUID: NONE SEEN
WBC, Synovial: 56 /mm3 (ref 0–200)

## 2017-12-27 LAB — CBC WITH DIFFERENTIAL/PLATELET
Basophils Absolute: 27 cells/uL (ref 0–200)
Basophils Relative: 0.2 %
EOS ABS: 14 {cells}/uL — AB (ref 15–500)
Eosinophils Relative: 0.1 %
HCT: 39.9 % (ref 35.0–45.0)
Hemoglobin: 13.5 g/dL (ref 11.7–15.5)
Lymphs Abs: 2480 cells/uL (ref 850–3900)
MCH: 30.3 pg (ref 27.0–33.0)
MCHC: 33.8 g/dL (ref 32.0–36.0)
MCV: 89.7 fL (ref 80.0–100.0)
MPV: 9.8 fL (ref 7.5–12.5)
Monocytes Relative: 4.1 %
NEUTROS PCT: 77.5 %
Neutro Abs: 10618 cells/uL — ABNORMAL HIGH (ref 1500–7800)
PLATELETS: 534 10*3/uL — AB (ref 140–400)
RBC: 4.45 10*6/uL (ref 3.80–5.10)
RDW: 13.2 % (ref 11.0–15.0)
TOTAL LYMPHOCYTE: 18.1 %
WBC: 13.7 10*3/uL — ABNORMAL HIGH (ref 3.8–10.8)
WBCMIX: 562 {cells}/uL (ref 200–950)

## 2017-12-27 LAB — SEDIMENTATION RATE: Sed Rate: 31 mm/h — ABNORMAL HIGH (ref 0–30)

## 2017-12-27 LAB — C-REACTIVE PROTEIN: CRP: 12.8 mg/L — ABNORMAL HIGH (ref <8.0)

## 2017-12-27 MED ORDER — STERILE WATER FOR INJECTION IJ SOLN
INTRAMUSCULAR | Status: AC
  Filled 2017-12-27: qty 10

## 2017-12-27 MED ORDER — LIDOCAINE HCL 1 % IJ SOLN
INTRAMUSCULAR | Status: AC
  Filled 2017-12-27: qty 20

## 2017-12-27 MED ORDER — LIDOCAINE HCL (PF) 1 % IJ SOLN
INTRAMUSCULAR | Status: DC | PRN
  Administered 2017-12-27: 8 mL

## 2017-12-28 ENCOUNTER — Emergency Department (HOSPITAL_COMMUNITY): Payer: Self-pay

## 2017-12-28 ENCOUNTER — Encounter (HOSPITAL_COMMUNITY): Payer: Self-pay | Admitting: Emergency Medicine

## 2017-12-28 ENCOUNTER — Other Ambulatory Visit: Payer: Self-pay

## 2017-12-28 ENCOUNTER — Emergency Department (HOSPITAL_COMMUNITY)
Admission: EM | Admit: 2017-12-28 | Discharge: 2017-12-28 | Disposition: A | Discharge status: Home / Self Care | Payer: Self-pay | Attending: Emergency Medicine | Admitting: Emergency Medicine

## 2017-12-28 DIAGNOSIS — J449 Chronic obstructive pulmonary disease, unspecified: Secondary | ICD-10-CM | POA: Insufficient documentation

## 2017-12-28 DIAGNOSIS — M5441 Lumbago with sciatica, right side: Secondary | ICD-10-CM | POA: Insufficient documentation

## 2017-12-28 DIAGNOSIS — R509 Fever, unspecified: Secondary | ICD-10-CM | POA: Insufficient documentation

## 2017-12-28 DIAGNOSIS — F17210 Nicotine dependence, cigarettes, uncomplicated: Secondary | ICD-10-CM | POA: Insufficient documentation

## 2017-12-28 DIAGNOSIS — I2510 Atherosclerotic heart disease of native coronary artery without angina pectoris: Secondary | ICD-10-CM | POA: Insufficient documentation

## 2017-12-28 DIAGNOSIS — Z7982 Long term (current) use of aspirin: Secondary | ICD-10-CM | POA: Insufficient documentation

## 2017-12-28 DIAGNOSIS — Z79899 Other long term (current) drug therapy: Secondary | ICD-10-CM | POA: Insufficient documentation

## 2017-12-28 LAB — COMPREHENSIVE METABOLIC PANEL
ALT: 12 U/L — ABNORMAL LOW (ref 14–54)
AST: 22 U/L (ref 15–41)
Albumin: 3.5 g/dL (ref 3.5–5.0)
Alkaline Phosphatase: 90 U/L (ref 38–126)
Anion gap: 12 (ref 5–15)
BUN: 11 mg/dL (ref 6–20)
CO2: 28 mmol/L (ref 22–32)
Calcium: 9 mg/dL (ref 8.9–10.3)
Chloride: 95 mmol/L — ABNORMAL LOW (ref 101–111)
Creatinine, Ser: 0.93 mg/dL (ref 0.44–1.00)
GFR calc Af Amer: >60 mL/min (ref >60)
GFR calc non Af Amer: >60 mL/min (ref >60)
Glucose, Bld: 110 mg/dL — ABNORMAL HIGH (ref 65–99)
Potassium: 5.2 mmol/L — ABNORMAL HIGH (ref 3.5–5.1)
Sodium: 135 mmol/L (ref 135–145)
Total Bilirubin: 0.4 mg/dL (ref 0.3–1.2)
Total Protein: 7.2 g/dL (ref 6.5–8.1)

## 2017-12-28 LAB — CBC WITH DIFFERENTIAL/PLATELET
Basophils Absolute: 0 10*3/uL (ref 0.0–0.1)
Basophils Relative: 0 %
Eosinophils Absolute: 0.3 10*3/uL (ref 0.0–0.7)
Eosinophils Relative: 2 %
HCT: 45 % (ref 36.0–46.0)
Hemoglobin: 14.3 g/dL (ref 12.0–15.0)
Lymphocytes Relative: 34 %
Lymphs Abs: 4.8 10*3/uL — ABNORMAL HIGH (ref 0.7–4.0)
MCH: 30.5 pg (ref 26.0–34.0)
MCHC: 31.8 g/dL (ref 30.0–36.0)
MCV: 95.9 fL (ref 78.0–100.0)
Monocytes Absolute: 1.3 10*3/uL — ABNORMAL HIGH (ref 0.1–1.0)
Monocytes Relative: 9 %
Neutro Abs: 7.5 10*3/uL (ref 1.7–7.7)
Neutrophils Relative %: 55 %
Platelets: 466 10*3/uL — ABNORMAL HIGH (ref 150–400)
RBC: 4.69 MIL/uL (ref 3.87–5.11)
RDW: 14.7 % (ref 11.5–15.5)
WBC: 13.9 10*3/uL — ABNORMAL HIGH (ref 4.0–10.5)

## 2017-12-28 LAB — URINALYSIS, ROUTINE W REFLEX MICROSCOPIC
Bacteria, UA: NONE SEEN
Bilirubin Urine: NEGATIVE
Glucose, UA: NEGATIVE mg/dL
Ketones, ur: NEGATIVE mg/dL
Leukocytes, UA: NEGATIVE
Nitrite: NEGATIVE
Protein, ur: NEGATIVE mg/dL
Specific Gravity, Urine: 1.017 (ref 1.005–1.030)
pH: 7 (ref 5.0–8.0)

## 2017-12-28 LAB — ACID FAST SMEAR (AFB, MYCOBACTERIA): Acid Fast Smear: NEGATIVE

## 2017-12-28 LAB — I-STAT CG4 LACTIC ACID, ED: Lactic Acid, Venous: 2.89 mmol/L (ref 0.5–1.9)

## 2017-12-28 LAB — ACID FAST SMEAR (AFB)

## 2017-12-28 MED ORDER — HYDROMORPHONE HCL 1 MG/ML IJ SOLN
0.2500 mg | Freq: Once | INTRAMUSCULAR | Status: AC
  Administered 2017-12-28: 0.25 mg via INTRAVENOUS
  Filled 2017-12-28: qty 1

## 2017-12-28 MED ORDER — KETOROLAC TROMETHAMINE 30 MG/ML IJ SOLN
30.0000 mg | Freq: Once | INTRAMUSCULAR | Status: AC
  Administered 2017-12-28: 30 mg via INTRAVENOUS
  Filled 2017-12-28: qty 1

## 2017-12-28 MED ORDER — HYDROMORPHONE HCL 1 MG/ML IJ SOLN
1.0000 mg | Freq: Once | INTRAMUSCULAR | Status: AC
  Administered 2017-12-28: 1 mg via INTRAVENOUS
  Filled 2017-12-28: qty 1

## 2017-12-28 MED ORDER — SODIUM CHLORIDE 0.9 % IV BOLUS (SEPSIS)
1000.0000 mL | Freq: Once | INTRAVENOUS | Status: AC
  Administered 2017-12-28: 1000 mL via INTRAVENOUS

## 2017-12-28 MED ORDER — LORAZEPAM 2 MG/ML IJ SOLN
1.0000 mg | Freq: Once | INTRAMUSCULAR | Status: AC
  Administered 2017-12-28: 1 mg via INTRAVENOUS
  Filled 2017-12-28: qty 1

## 2017-12-28 NOTE — ED Provider Notes (Signed)
[facility] EMERGENCY DEPARTMENT Provider Note   CSN: [id number] Arrival date & time: 12/28/17  1618     History   Chief Complaint Chief Complaint  Patient presents with  . Fever  . Back Pain    HPI AUDRIELLE VANKUREN is a 62 y.o. female.  MARGY SUMLER is a 62 y.o. Female with a complex past medical history including CAD, COPD, HLD, Hepatitis C, and chronic low back pain, presents to the ED for evaluation of worsening low back pain which radiates into her right leg and reports lower grade fevers at home since yesterday. Pt reports she had fluid aspirated from her lumbar spine yesterday with Dr. Otelia Sergeant, her orthopedist after having recent MRI which showed extensive marrow and soft tissue edema at the right L5-S1 facet joint concerning for septic facet arthritis versus acute noninfectious inflammatory facet arthritis. She spoke with Dr. Otelia Sergeant today regarding worsening pain and fever of 100.8 and was advised to come to the ED. Pt reports constant aching in low back, worse on the right side, shooting pains down her right leg, she has been using her home oxycodone without relief. She reports some intermittent numbness in right foot, but this is not new since pt's MRI earlier this week. Pt denies any abdominal pain, N/Vor diarrhea. No dysuria, no loss of bowel or bladder control. No CP, SOB or cough, no URI symptoms. Pt denies any history of cancer or IV drug use.      Past Medical History:  Diagnosis Date  . Acute hepatitis C without mention of hepatic coma(070.51)   . Anemia   . Carpal tunnel syndrome   . Chest pain, unspecified   . Chronic airway obstruction, not elsewhere classified   . Colitis   . Coronary atherosclerosis of unspecified type of vessel, native or graft   . Disorders of bursae and tendons in shoulder region, unspecified   . Infective otitis externa, unspecified   . Mixed hyperlipidemia   . Nonspecific abnormal results of thyroid function study   .  Other bursitis disorders   . Other symptoms involving nervous and musculoskeletal systems(781.99)     Patient Active Problem List   Diagnosis Date Noted  . Low back pain 12/23/2017  . Tobacco use disorder 06/24/2016  . Weight gain 01/02/2012  . Stool incontinence 11/19/2011  . Abdominal pain, right lower quadrant 11/19/2011  . EUSTACHIAN TUBE DYSFUNCTION 08/10/2009  . SINUSITIS, ACUTE 08/10/2009  . TRANSIENT GLOBAL AMNESIA 01/12/2009  . HIP PAIN, RIGHT 10/04/2008  . HEPATITIS C 07/28/2008  . COPD, 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  . HYPERLIPIDEMIA, MIXED, WITH LOW HDL 08/30/2007  . OTITIS EXTERNA, INFECTIVE NOS 08/27/2007  . ROTATOR CUFF SYNDROME, RIGHT 08/27/2007  . Chest pain at rest 08/27/2007  . BURSITIS NOS 07/13/2007  . MUSCULOSKELETAL PAIN 07/13/2007  . CARPAL TUNNEL SYNDROME, BILATERAL 04/10/2007    Past Surgical History:  Procedure Laterality Date  . ABDOMINAL HYSTERECTOMY    . CARDIAC CATHETERIZATION    . CARDIAC CATHETERIZATION N/A 06/25/2016   Procedure: Left Heart Cath and Coronary Angiography;  Surgeon: Orpah Cobb, MD;  Location: [facility] INVASIVE CV LAB;  Service: Cardiovascular;  Laterality: N/A;  . CESAREAN SECTION    . CHOLECYSTECTOMY    . IR FLUORO GUIDED NEEDLE PLC ASPIRATION/INJECTION LOC  12/27/2017  . TONSILLECTOMY      OB History    No data available       Home Medications    Prior to Admission medications   Medication Sig Start Date  End Date Taking? Authorizing Provider  aspirin EC 81 MG EC tablet Take 1 tablet (81 mg total) by mouth daily. 06/25/16   Orpah Cobb, MD  esomeprazole (NEXIUM) 40 MG capsule Take 40 mg by mouth 2 (two) times daily before a meal.    [provider]  LORazepam (ATIVAN) 1 MG tablet Take 1 mg by mouth daily as needed for anxiety.    [provider]  methocarbamol (ROBAXIN) 500 MG tablet Take 1 tablet (500 mg total) by mouth 4 (four) times daily. 12/23/17   Cristie Hem, PA-C  metoprolol tartrate (LOPRESSOR) 25 MG  tablet Take 0.5 tablets (12.5 mg total) by mouth 2 (two) times daily. 06/25/16   Orpah Cobb, MD  nitroGLYCERIN (NITROSTAT) 0.4 MG SL tablet Place 1 tablet (0.4 mg total) under the tongue every 5 (five) minutes x 3 doses as needed for chest pain. 06/25/16   Orpah Cobb, MD  oxyCODONE (OXY IR/ROXICODONE) 5 MG immediate release tablet Take 1 tablet (5 mg total) by mouth every 4 (four) hours as needed for moderate pain. This patient has acute inflamatory vs infection changes in her lumbar spine and has pain in excess of what she has Require pain medications in the past. She is undergoing evaluation o 12/26/17   Kerrin Champagne, MD  pravastatin (PRAVACHOL) 10 MG tablet Take 1 tablet (10 mg total) by mouth daily at 6 PM. 06/25/16   Orpah Cobb, MD  predniSONE (STERAPRED UNI-PAK 21 TAB) 10 MG (21) TBPK tablet Take as directed 12/23/17   Cristie Hem, PA-C    Family History Family History  Problem Relation Age of Onset  . Crohn's disease Daughter 48       colon resection  . Irritable bowel syndrome Daughter   . Colonic polyp Mother   . Thyroid disease Mother   . Uterine cancer Maternal Grandmother   . Heart murmur Daughter        SVT  . Colon cancer Neg Hx     Social History Social History   Tobacco Use  . Smoking status: Current Every Day Smoker    Types: Cigarettes    Last attempt to quit: 10/07/2011    Years since quitting: 6.2  . Smokeless tobacco: Never Used  . Tobacco comment: 2 cigarettes a day  Substance Use Topics  . Alcohol use: No  . Drug use: No     Allergies   Acetaminophen-codeine; Morphine and related; and Azithromycin   Review of Systems Review of Systems  Constitutional: Positive for chills and fever.  HENT: Negative for congestion, rhinorrhea and sore throat.   Eyes: Negative for visual disturbance.  Respiratory: Negative for cough, chest tightness and shortness of breath.   Cardiovascular: Negative for chest pain.  Gastrointestinal: Negative for  abdominal pain, diarrhea, nausea and vomiting.  Genitourinary: Negative for dysuria, flank pain, frequency, hematuria, vaginal bleeding and vaginal discharge.  Musculoskeletal: Positive for back pain. Negative for arthralgias, gait problem, myalgias, neck pain and neck stiffness.  Skin: Negative for color change, pallor and rash.  Neurological: Negative for dizziness, weakness, light-headedness, numbness and headaches.     Physical Exam Updated Vital Signs BP 101/71 (BP Location: Left Arm)   Pulse 96   Temp 100.2 F (37.9 C) (Oral)   Resp (!) 25   Ht 5\' 1"  (1.549 m)   Wt 60.3 kg (133 lb)   SpO2 96%   BMI 25.13 kg/m   Physical Exam  Constitutional: She appears well-developed and well-nourished. No distress.  HENT:  Head: Normocephalic and atraumatic.  Mouth/Throat: Oropharynx is clear and moist.  Eyes: Right eye exhibits no discharge. Left eye exhibits no discharge.  Neck: Normal range of motion. Neck supple.  Cardiovascular: Normal rate, regular rhythm, normal heart sounds and intact distal pulses.  Pulmonary/Chest: Effort normal and breath sounds normal. No stridor. No respiratory distress. She has no wheezes. She has no rales.  Respirations equal and unlabored, patient able to speak in full sentences, lungs clear to auscultation bilaterally  Abdominal: Soft. Bowel sounds are normal. She exhibits no distension and no mass. There is no tenderness. There is no guarding.  Abdomen soft, NTTP in all quadrants, no peritoneal signs  Musculoskeletal:  Right lower back diffusely tender to palpation, no erythema warmth or palpable deformity, no focal midline tenderness, no erythema, induration or drainage at aspiration sight  Neurological: She is alert. Coordination normal.  Speech is clear, able to follow commands CN III-XII intact Normal strength in upper and lower extremities bilaterally including dorsiflexion and plantar flexion, strong and equal grip strength Sensation normal to  light and sharp touch Moves extremities without ataxia, coordination intact  Skin: Skin is warm and dry. Capillary refill takes less than 2 seconds. She is not diaphoretic.  Psychiatric: She has a normal mood and affect. Her behavior is normal.  Nursing note and vitals reviewed.    ED Treatments / Results  Labs (all labs ordered are listed, but only abnormal results are displayed) Labs Reviewed  COMPREHENSIVE METABOLIC PANEL - Abnormal; Notable for the following components:      Result Value   Potassium 5.2 (*)    Chloride 95 (*)    Glucose, Bld 110 (*)    ALT 12 (*)    All other components within normal limits  CBC WITH DIFFERENTIAL/PLATELET - Abnormal; Notable for the following components:   WBC 13.9 (*)    Platelets 466 (*)    Lymphs Abs 4.8 (*)    Monocytes Absolute 1.3 (*)    All other components within normal limits  URINALYSIS, ROUTINE W REFLEX MICROSCOPIC - Abnormal; Notable for the following components:   Hgb urine dipstick MODERATE (*)    Squamous Epithelial / LPF 0-5 (*)    All other components within normal limits  I-STAT CG4 LACTIC ACID, ED - Abnormal; Notable for the following components:   Lactic Acid, Venous 2.89 (*)    All other components within normal limits  I-STAT CG4 LACTIC ACID, ED    EKG  EKG Interpretation  Date/Time:  Sunday December 28 2017 18:40:08 EDT Ventricular Rate:  89 PR Interval:    QRS Duration: 93 QT Interval:  328 QTC Calculation: 399 R Axis:   -17 Text Interpretation:  Sinus rhythm Borderline short PR interval Borderline left axis deviation Confirmed by Raeford RazorKohut, Stephen [phone]([id number]) on 12/28/2017 8:03:25 PM       Radiology Dg Chest 2 View  Result Date: 12/28/2017 CLINICAL DATA:  Fever EXAM: CHEST - 2 VIEW COMPARISON:  Feb 12, 2017 FINDINGS: There is slight bibasilar atelectasis. There is no edema or consolidation. The heart size and pulmonary vascularity are normal. No adenopathy. No bone lesions. IMPRESSION: Slight bibasilar  atelectasis.  No edema or consolidation. Electronically Signed   By: Bretta BangWilliam  Woodruff III M.D.   On: 12/28/2017 19:28   Procedures Procedures (including critical care time)  Medications Ordered in ED Medications  sodium chloride 0.9 % bolus 1,000 mL (0 mLs Intravenous Stopped 12/28/17 2137)  HYDROmorphone (DILAUDID) injection 1 mg (1 mg Intravenous  Given 12/28/17 1935)  ketorolac (TORADOL) 30 MG/ML injection 30 mg (30 mg Intravenous Given 12/28/17 1934)  LORazepam (ATIVAN) injection 1 mg (1 mg Intravenous Given 12/28/17 1934)  HYDROmorphone (DILAUDID) injection 0.25 mg (0.25 mg Intravenous Given 12/28/17 2225)     Initial Impression / Assessment and Plan / ED Course  I have reviewed the triage vital signs and the nursing notes.  Pertinent labs & imaging results that were available during my care of the patient were reviewed by me and considered in my medical decision making (see chart for details).  Pt presents to the ED valuation of worsening low back pain that radiates down the right leg, as well as low-grade fevers at home, T-max 100.8, 99.6 here and vitals otherwise normal.  Back is diffusely tender over the right side, no focal midline tenderness, no erythema, induration, swelling or drainage noted from aspiration site.  Abdomen is benign, lungs are clear.  Will give fluid bolus and dilaudid, ativan and Toradol to try and get patients pain under control.  7:32 PM spoke with Dr. Ophelia Charter with [facility] orthopedics who reviewed patient's labs from yesterday including synovial fluid cell count and cultures, with only 56 white cells present, and no growth from the culture thus far, Dr. Ophelia Charter feels it is unlikely that the infection is coming from the back.  Dr. Ophelia Charter recommends if there is no other source of infection identified, patient should be safe for discharge home after pain management for continued followed up with Dr. Otelia Sergeant in the office  I have reviewed patient's labs, leukocytosis of 13.9  without left shift, which is stable in comparison to her labs from yesterday with white count of 13.7, normal hemoglobin, potassium is just slightly elevated at 5.2, no intervention necessary, no other electrolyte derangements requiring intervention, normal liver and kidney function, urine shows no signs of infections, small amount of hematuria present, this is been present on patient's urinalysis in the past, chest x-ray shows slight bibasilar atelectasis, but no evidence of edema, consolidation or other active cardiopulmonary disease.  EKG without concerning changes.  Lactic acid was elevated at 2.89, patient provided fluids which should improve this.  Laboratory evaluation overall reassuring, no evidence of other source of infection and again Dr. Ophelia Charter does not feel that patient's back is source of infection today.  On reevaluation pain has been managed well here in the ED.  Will give patient small dose just prior to discharge, stable for discharge home at this time, patient should continue to use her home pain medications, she has follow-up appointment with Dr. Otelia Sergeant tomorrow morning.  Return precautions discussed.  Patient and daughter expressed understanding and are in agreement with plan.  Patient discussed with Dr. Juleen China who is in agreement with plan.  Final Clinical Impressions(s) / ED Diagnoses   Final diagnoses:  Low back pain with right-sided sciatica, unspecified back pain laterality, unspecified chronicity    ED Discharge Orders    None       Legrand Rams 12/29/17 0118    Raeford Razor, MD 12/29/17 [phone]

## 2017-12-28 NOTE — ED Triage Notes (Signed)
PT states she had fluid drawn off cyst/lower back in radiology yesterday.  Reports fever up to 100.8 at home today and increased lower back pain, sciatica pain, R leg/ankle swelling.  Spoke with Dr. Otelia SergeantNitka and had been advised to come to ED if fever and uncontrolled pain.

## 2017-12-28 NOTE — Discharge Instructions (Signed)
Please continue using your home pain medications and follow-up with Dr. Otelia SergeantNitka tomorrow.

## 2017-12-28 NOTE — ED Notes (Signed)
Took Motrin 800mg  just pta.

## 2017-12-28 NOTE — ED Notes (Signed)
[name] Labrumhelsea F. RN nurse first made aware pt Lactic Acid results. Ed-lab

## 2017-12-29 ENCOUNTER — Other Ambulatory Visit (INDEPENDENT_AMBULATORY_CARE_PROVIDER_SITE_OTHER): Payer: Self-pay | Admitting: Specialist

## 2017-12-29 ENCOUNTER — Ambulatory Visit (INDEPENDENT_AMBULATORY_CARE_PROVIDER_SITE_OTHER): Payer: Self-pay | Admitting: Specialist

## 2017-12-29 ENCOUNTER — Encounter (HOSPITAL_COMMUNITY): Payer: Self-pay

## 2017-12-29 ENCOUNTER — Telehealth (INDEPENDENT_AMBULATORY_CARE_PROVIDER_SITE_OTHER): Payer: Self-pay | Admitting: Radiology

## 2017-12-29 DIAGNOSIS — M009 Pyogenic arthritis, unspecified: Secondary | ICD-10-CM

## 2017-12-29 NOTE — Telephone Encounter (Signed)
-----   Message from Kerrin ChampagneJames E Nitka, MD sent at 12/28/2017  4:42 PM EDT ----- Laboratory from aspiration of the lumbar spine returned with 50 WBC per HPF, not suggestive of infection. Her daughter called from ER today where she has gone due to worsening pain, complaining of a low grade fever. Her I told her that 6  There is no sign of infection in her back and that she does have a pinched nerve. If her pain is not controlled by the current medication Then she should go to the ER, she is taking oxycodone IR 1 every 6 hours, is discussion with her she is taking 2 every 6 and not obtaining relief. She is to be seen in the office tomorrow to decide if Surgery is appropriate but for now she should be seen in the ER as She reports that she does not have a primary care physician to work Up the cause of her fever.

## 2017-12-29 NOTE — Telephone Encounter (Signed)
Noted. Dr. Otelia SergeantNitka is out of the office and pt has been sched for 12/30/2017

## 2017-12-30 ENCOUNTER — Ambulatory Visit (INDEPENDENT_AMBULATORY_CARE_PROVIDER_SITE_OTHER): Payer: Self-pay | Admitting: Specialist

## 2017-12-30 ENCOUNTER — Encounter (INDEPENDENT_AMBULATORY_CARE_PROVIDER_SITE_OTHER): Payer: Self-pay | Admitting: Specialist

## 2017-12-30 ENCOUNTER — Ambulatory Visit (INDEPENDENT_AMBULATORY_CARE_PROVIDER_SITE_OTHER): Payer: Self-pay

## 2017-12-30 DIAGNOSIS — M4316 Spondylolisthesis, lumbar region: Secondary | ICD-10-CM

## 2017-12-30 DIAGNOSIS — M5442 Lumbago with sciatica, left side: Secondary | ICD-10-CM

## 2017-12-30 DIAGNOSIS — G8929 Other chronic pain: Secondary | ICD-10-CM

## 2017-12-30 DIAGNOSIS — M48062 Spinal stenosis, lumbar region with neurogenic claudication: Secondary | ICD-10-CM

## 2017-12-30 LAB — BODY FLUID CULTURE: Culture: NO GROWTH

## 2017-12-30 MED ORDER — OXYCODONE HCL ER 20 MG PO T12A: 20.0000 mg | EXTENDED_RELEASE_TABLET | Freq: Two times a day (BID) | ORAL | 0 refills | Status: DC

## 2017-12-30 NOTE — Progress Notes (Addendum)
Office Visit Note   Patient: [name]           Date of Birth: Jun 17, 1956           MRN: [id number] Visit Date: 12/30/2017              Requested by: No referring provider defined for this encounter. PCP: Patient, No Pcp Per   Assessment & Plan: Visit Diagnoses:  1. Chronic bilateral low back pain with left-sided sciatica   2. Spondylolisthesis, lumbar region   3. Spinal stenosis of lumbar region with neurogenic claudication     Plan: Avoid bending, stooping and avoid lifting weights greater than 10 lbs. Avoid prolong standing and walking. Order for a new walker with wheels. Surgery scheduling secretary Tivis Ringer, will call you in the next week to schedule for surgery.  Surgery recommended is a two level lumbardecompression(laminectomy) right L4-5 and L5-S1, a fusion will be done at L4-5  this would be done with rods, screws and cages with local bone graft and allograft (donor bone graft). Take oxycodone IR 5 mg every 3-4 prn break through pain and oxycontin 20 mg every  12 hours for pain. Risk of surgery includes risk of infection 1 in 200 patients, bleeding 1/2% chance you would need a transfusion.   Risk to the nerves is one in 10,000. You will need to use a brace for 3 months and wean from the brace on the 4th month. Expect improved walking and standing tolerance. Expect relief of leg pain but numbness may persist depending on the length and degree of pressure that has been present.    Follow-Up Instructions: Return in about 4 weeks (around 01/27/2018).   Orders:  Orders Placed This Encounter  Procedures  . XR Lumbar Spine 2-3 Views   Meds ordered this encounter  Medications  . oxyCODONE (OXYCONTIN) 20 mg 12 hr tablet    Sig: Take 1 tablet (20 mg total) by mouth every 12 (twelve) hours. Current oxycodone IR is not controlling this patients pain, she has nerve compression in the lumbar spine and is being scheduled for a lumbar surgery.    Dispense:  14  tablet    Refill:  0      Procedures: No procedures performed   Clinical Data: No additional findings.   Subjective: Chief Complaint  Patient presents with  . Lower Back - Follow-up    Follow up after Cyst Aspiration and Lab work    62 year old female with a history of low back pain and radiating into the right leg and now into the left leg. "i'm throbbing down the right side with pain". Currently she is having back pain and radiation into the right leg. She received from the ER last night Toradol, Ativan and Dilaudid. She has been taking oxycodone 5 mg and she is taking 2 at a time every 3 hours. She was going to take gabapentin and she took 3 overnight 12, 4 and 9AM. I didn't even read the bottle, I took one every 5 hours. She was able to sleep. She is hurting and wants something done, "all this medical knowledge, why doesn't somebody do something".Seen this past Friday 3 days ago and had an aspiration of the right L5-S1 facet. The aspiration done Saturday 2 days ago did not show purulence, 50 wbc per hpf. No bacteria seen. Today  She is starting to experience left leg pain. Seen in the ER.    Review of Systems  Constitutional: Negative.  HENT: Negative.   Eyes: Negative.   Respiratory: Negative.   Cardiovascular: Negative.   Gastrointestinal: Negative.   Endocrine: Negative.   Genitourinary: Negative.   Musculoskeletal: Negative.   Skin: Negative.   Allergic/Immunologic: Negative.   Neurological: Negative.   Hematological: Negative.   Psychiatric/Behavioral: Negative.      Objective: Vital Signs: There were no vitals taken for this visit.  Physical Exam  Back Exam   Tenderness  The patient is experiencing tenderness in the lumbar.  Range of Motion  Extension: abnormal  Flexion: abnormal  Lateral bend right: abnormal  Lateral bend left: abnormal  Rotation right: abnormal  Rotation left: abnormal   Muscle Strength  The patient has normal back  strength. Right Quadriceps:  5/5  Left Quadriceps:  5/5  Right Hamstrings:  5/5  Left Hamstrings:  5/5   Tests  Straight leg raise right: negative Straight leg raise left: negative  Reflexes  Patellar: 0/4 Achilles: 0/4 Babinski's sign: normal   Other  Toe walk: abnormal Heel walk: abnormal Sensation: normal Gait: antalgic  Erythema: no back redness Scars: absent      Specialty Comments:  No specialty comments available.  Imaging: Xr Lumbar Spine 2-3 Views  Result Date: 12/30/2017 Lateral flexion and extension radiographs show and grade 1 anterolisthesis L4-5 does not change with flexion and extension. Mild DDD L2-3 and L3-4.  Previous radiographs have shown a scoliosis curve of the Thoracolumbar spine.    PMFS History: Patient Active Problem List   Diagnosis Date Noted  . Low back pain 12/23/2017  . Tobacco use disorder 06/24/2016  . Weight gain 01/02/2012  . Stool incontinence 11/19/2011  . Abdominal pain, right lower quadrant 11/19/2011  . EUSTACHIAN TUBE DYSFUNCTION 08/10/2009  . SINUSITIS, ACUTE 08/10/2009  . TRANSIENT GLOBAL AMNESIA 01/12/2009  . HIP PAIN, RIGHT 10/04/2008  . HEPATITIS C 07/28/2008  . COPD, 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  . HYPERLIPIDEMIA, MIXED, WITH LOW HDL 08/30/2007  . OTITIS EXTERNA, INFECTIVE NOS 08/27/2007  . ROTATOR CUFF SYNDROME, RIGHT 08/27/2007  . Chest pain at rest 08/27/2007  . BURSITIS NOS 07/13/2007  . MUSCULOSKELETAL PAIN 07/13/2007  . CARPAL TUNNEL SYNDROME, BILATERAL 04/10/2007   Past Medical History:  Diagnosis Date  . Acute hepatitis C without mention of hepatic coma(070.51)   . Anemia   . Carpal tunnel syndrome   . Chest pain, unspecified   . Chronic airway obstruction, not elsewhere classified   . Colitis   . Coronary atherosclerosis of unspecified type of vessel, native or graft   . Disorders of bursae and tendons in shoulder region, unspecified   . Infective otitis externa, unspecified   . Mixed hyperlipidemia   .  Nonspecific abnormal results of thyroid function study   . Other bursitis disorders   . Other symptoms involving nervous and musculoskeletal systems(781.99)     Family History  Problem Relation Age of Onset  . Crohn's disease Daughter 39       colon resection  . Irritable bowel syndrome Daughter   . Colonic polyp Mother   . Thyroid disease Mother   . Uterine cancer Maternal Grandmother   . Heart murmur Daughter        SVT  . Colon cancer Neg Hx     Past Surgical History:  Procedure Laterality Date  . ABDOMINAL HYSTERECTOMY    . CARDIAC CATHETERIZATION    . CARDIAC CATHETERIZATION N/A 06/25/2016   Procedure: Left Heart Cath and Coronary Angiography;  Surgeon: Orpah Cobb, MD;  Location: [facility] INVASIVE CV LAB;  Service: Cardiovascular;  Laterality: N/A;  . CESAREAN SECTION    . CHOLECYSTECTOMY    . IR FLUORO GUIDED NEEDLE PLC ASPIRATION/INJECTION LOC  12/27/2017  . TONSILLECTOMY     Social History   Occupational History  . Occupation: care giver  Tobacco Use  . Smoking status: Current Every Day Smoker    Types: Cigarettes    Last attempt to quit: 10/07/2011    Years since quitting: 6.2  . Smokeless tobacco: Never Used  . Tobacco comment: 2 cigarettes a day  Substance and Sexual Activity  . Alcohol use: No  . Drug use: No  . Sexual activity: Yes    Partners: Male

## 2017-12-30 NOTE — Patient Instructions (Signed)
Avoid bending, stooping and avoid lifting weights greater than 10 lbs. Avoid prolong standing and walking. Order for a new walker with wheels. Surgery scheduling secretary Tivis RingerSherri Billings, will call you in the next week to schedule for surgery.  Surgery recommended is a two level lumbardecompression(laminectomy) right L4-5 and L5-S1, a fusion will be done at L4-5  this would be done with rods, screws and cages with local bone graft and allograft (donor bone graft). Take oxycodone IR 5 mg every 3-4 prn break through pain and oxycontin 20 mg every  12 hours for pain. Risk of surgery includes risk of infection 1 in 200 patients, bleeding 1/2% chance you would need a transfusion.   Risk to the nerves is one in 10,000. You will need to use a brace for 3 months and wean from the brace on the 4th month. Expect improved walking and standing tolerance. Expect relief of leg pain but numbness may persist depending on the length and degree of pressure that has been present.

## 2018-01-01 ENCOUNTER — Other Ambulatory Visit (INDEPENDENT_AMBULATORY_CARE_PROVIDER_SITE_OTHER): Payer: Self-pay | Admitting: Specialist

## 2018-01-01 MED ORDER — OXYCODONE HCL 5 MG PO TABS: 5.0000 mg | ORAL_TABLET | ORAL | 0 refills | Status: DC | PRN

## 2018-01-01 NOTE — Progress Notes (Signed)
This patient's oxyIR is likely to be running out and is renewed the q 12 hour oxycontin is not due for refill till next Monday.Marland Kitchen. jen

## 2018-01-01 NOTE — Telephone Encounter (Signed)
Sent request to Dr. Nitka 

## 2018-01-01 NOTE — Telephone Encounter (Signed)
Patient requesting RX refill on oxycodone. She said she will get down to 10 pills left over the weekend and she said Dr. Otelia SergeantNitka told her not to wait that long.  CB # [phone]

## 2018-01-01 NOTE — Telephone Encounter (Signed)
I called and advised patient her rx is ready for pick up at the front desk

## 2018-01-02 ENCOUNTER — Ambulatory Visit (INDEPENDENT_AMBULATORY_CARE_PROVIDER_SITE_OTHER): Payer: Self-pay | Admitting: Orthopaedic Surgery

## 2018-01-05 ENCOUNTER — Other Ambulatory Visit (INDEPENDENT_AMBULATORY_CARE_PROVIDER_SITE_OTHER): Payer: Self-pay | Admitting: Specialist

## 2018-01-05 MED ORDER — OXYCODONE HCL ER 20 MG PO T12A: 20.0000 mg | EXTENDED_RELEASE_TABLET | Freq: Two times a day (BID) | ORAL | 0 refills | Status: DC

## 2018-01-05 NOTE — Telephone Encounter (Signed)
Pt called checking on the status of the refill 

## 2018-01-05 NOTE — Telephone Encounter (Signed)
Patient called asking for a refill on her OxyContin. She states she will be out tomorrow. CB # [phone]

## 2018-01-06 NOTE — Telephone Encounter (Signed)
I called and advised that her rx is ready for pick up.

## 2018-01-08 ENCOUNTER — Telehealth (INDEPENDENT_AMBULATORY_CARE_PROVIDER_SITE_OTHER): Payer: Self-pay | Admitting: Specialist

## 2018-01-08 NOTE — Telephone Encounter (Signed)
Patient called saying she is down to 13 pills of her oxycodone and would like a refill whenever she is able to.

## 2018-01-08 NOTE — Telephone Encounter (Signed)
Patient called saying she is down to 13 pills of her oxycodone and would like a refill whenever she is able to. CB # [phone]

## 2018-01-12 ENCOUNTER — Other Ambulatory Visit (INDEPENDENT_AMBULATORY_CARE_PROVIDER_SITE_OTHER): Payer: Self-pay | Admitting: Specialist

## 2018-01-12 ENCOUNTER — Encounter (INDEPENDENT_AMBULATORY_CARE_PROVIDER_SITE_OTHER): Payer: Self-pay | Admitting: Surgery

## 2018-01-12 ENCOUNTER — Ambulatory Visit (INDEPENDENT_AMBULATORY_CARE_PROVIDER_SITE_OTHER): Payer: Self-pay | Admitting: Surgery

## 2018-01-12 ENCOUNTER — Other Ambulatory Visit: Payer: Self-pay

## 2018-01-12 ENCOUNTER — Encounter (HOSPITAL_COMMUNITY): Payer: Self-pay | Admitting: Certified Registered Nurse Anesthetist

## 2018-01-12 DIAGNOSIS — M5416 Radiculopathy, lumbar region: Secondary | ICD-10-CM

## 2018-01-12 DIAGNOSIS — M4316 Spondylolisthesis, lumbar region: Secondary | ICD-10-CM

## 2018-01-12 MED ORDER — OXYCODONE HCL 5 MG PO TABS: 5.0000 mg | ORAL_TABLET | ORAL | 0 refills | Status: DC | PRN

## 2018-01-12 NOTE — Anesthesia Preprocedure Evaluation (Addendum)
Anesthesia Evaluation  Patient identified by MRN, date of birth, ID band Patient awake    Reviewed: Allergy & Precautions, NPO status , Patient's Chart, lab work & pertinent test results  Airway Mallampati: III  TM Distance: >3 FB Neck ROM: Full    Dental  (+) Edentulous Upper, Edentulous Lower   Pulmonary COPD,  COPD inhaler, former smoker,    Pulmonary exam normal breath sounds clear to auscultation       Cardiovascular + CAD  Normal cardiovascular exam Rhythm:Regular Rate:Normal  ECG: SR, rate 89  See cardiologist Algie Coffer([name])  [facility], 30 %stenosed. Ost Cx to Prox Cx lesion, 15 %stenosed. Prox RCA lesion, 20 %stenosed. The left ventricular systolic function is normal. LV end diastolic pressure is normal.   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  Reproductive/Obstetrics                            Anesthesia Physical Anesthesia Plan  ASA: III  Anesthesia Plan: General   Post-op Pain Management:    Induction: Intravenous  PONV Risk Score and Plan: 3 and Midazolam, Dexamethasone, Ondansetron and Treatment may vary due to age or medical condition  Airway Management Planned: Oral ETT  Additional Equipment:   Intra-op Plan:   Post-operative Plan: Extubation in OR  Informed Consent: I have reviewed the patients History and Physical, chart, labs and discussed the procedure including the risks, benefits and alternatives for the proposed anesthesia with the patient or authorized  representative who has indicated his/her understanding and acceptance.   Dental advisory given  Plan Discussed with: CRNA  Anesthesia Plan Comments:        Anesthesia Quick Evaluation

## 2018-01-12 NOTE — Progress Notes (Signed)
Patient with history of L4-5 spondylolisthesis, stenosis and right L5-S1 synovial cyst comes in for preop H&P.  We received preop cardiac clearance.  Full H&P placed in patient's chart.

## 2018-01-12 NOTE — Telephone Encounter (Signed)
Pt is aware her rx is ready for pick up at the front desk 

## 2018-01-12 NOTE — Telephone Encounter (Signed)
Patient called again wanting to let Dr. Otelia SergeantNitka know that she is completely out of her medication now and has surgery in the morning. She is asking for something to help her ger through the day since she is in a lot of pain. Also lost her phone, give her a call back on [phone]

## 2018-01-12 NOTE — Progress Notes (Signed)
Spoke with [name] for pre-op call. [name] states she has hx of CAD and sees Dr. Algie Coffer[place]. She denies any recent chest pain or sob. [name] has never had to have stents. [name] has hx of COPD, she states she is using an Advair inhaler as needed (it's her mother's prescription) and she states Dr. Algie Coffer[place] is ok with that. In his office note on 01/08/18 it has her inhaler as ProAir. Cardiac/medical clearance in chart given by Dr. Algie CofferK[place]

## 2018-01-12 NOTE — H&P (Signed)
[name] is an 62 y.o. female.   Chief Complaint: Low back pain and lower extremity radiculopathy HPI: Patient with history of L4-5 and L5-S1 stenosis and the above complaint came in to the clinic today for preop history and physical.  Progressively worsening symptoms.  Failed conservative treatment.  We received preop cardiac clearance from Dr. Orpah Cobb  Past Medical History:  Diagnosis Date  . Acute hepatitis C without mention of hepatic coma(070.51)   . Anemia   . Arthritis    back  . Carpal tunnel syndrome   . Chest pain, unspecified   . Chronic airway obstruction, not elsewhere classified   . Colitis   . Coronary atherosclerosis of unspecified type of vessel, native or graft   . Disorders of bursae and tendons in shoulder region, unspecified   . GERD (gastroesophageal reflux disease)   . Headache    migraines  . Hepatitis    hx of hepatitis C  . Infective otitis externa, unspecified   . Mixed hyperlipidemia   . Nonspecific abnormal results of thyroid function study   . Other bursitis disorders   . Other symptoms involving nervous and musculoskeletal systems(781.99)   . Pneumonia   . Seizures (HCC)    none for 4 years (as of 01/2018) "pseudo seizures"    Past Surgical History:  Procedure Laterality Date  . ABDOMINAL HYSTERECTOMY    . CARDIAC CATHETERIZATION    . CARDIAC CATHETERIZATION N/A 06/25/2016   Procedure: Left Heart Cath and Coronary Angiography;  Surgeon: Orpah Cobb, MD;  Location: [facility];  Service: Cardiovascular;  Laterality: N/A;  . CESAREAN SECTION    . CHOLECYSTECTOMY    . COLONOSCOPY    . IR FLUORO GUIDED NEEDLE PLC ASPIRATION/INJECTION LOC  12/27/2017  . TONSILLECTOMY      Family History  Problem Relation Age of Onset  . Crohn's disease Daughter 77       colon resection  . Irritable bowel syndrome Daughter   . Colonic polyp Mother   . Thyroid disease Mother   . COPD Mother   . Uterine cancer Maternal Grandmother   . Heart  murmur Daughter        SVT  . Colon cancer Neg Hx    Social History:  reports that she quit smoking 5 days ago. Her smoking use included cigarettes. She has never used smokeless tobacco. She reports that she does not drink alcohol or use drugs.  Allergies:  Allergies  Allergen Reactions  . Acetaminophen-Codeine     Hand get red, feels like it's on fire  . Morphine And Related     "makes me feel like i'm on fire"  . Azithromycin Itching and Rash    No medications prior to admission.    No results found for this or any previous visit (from the past 48 hour(s)). No results found.  ROS  There were no vitals taken for this visit. Physical Exam  Constitutional: She is oriented to person, place, and time. She appears well-developed. No distress.  HENT:  Head: Normocephalic and atraumatic.  Eyes: Pupils are equal, round, and reactive to light. EOM are normal.  Neck: Normal range of motion.  Respiratory: Effort normal. No respiratory distress. She has no wheezes.  GI: Soft. Bowel sounds are normal. She exhibits no distension. There is no tenderness.  Musculoskeletal:   Back Exam   Tenderness  The patient is experiencing tenderness in the lumbar.  Range of Motion  Extension: abnormal  Flexion: abnormal  Lateral bend right: abnormal  Lateral bend left: abnormal  Rotation right: abnormal  Rotation left: abnormal   Muscle Strength  The patient has normal back strength. Right Quadriceps:  5/5  Left Quadriceps:  5/5  Right Hamstrings:  5/5  Left Hamstrings:  5/5   Tests  Straight leg raise right: negative Straight leg raise left: negative  Reflexes  Patellar: 0/4 Achilles: 0/4 Babinski's sign: normal   Other  Toe walk: abnormal Heel walk: abnormal Sensation: normal Gait: antalgic  Erythema: no back redness Scars: absent   Neurological: She is alert and oriented to person, place, and time.  Skin: Skin is warm and dry.     Assessment/Plan L4-5 and  L5-S1 stenosis.  L4-5 spondylolisthesis.  Low back pain and lower extremity radiculopathy   We will proceed with Transforaminal lumbar interbody fusion L4-5 with screws, cages and rods, local bone graft, Allograft, Vivigen, Decompression L4-5 and L5-S1 as scheduled.  Surgical procedure along with possible risks and complications discussed.  All questions answered. Linda KiefJames Laniece Hornbaker, PA-C 01/12/2018, 5:42 PM

## 2018-01-13 ENCOUNTER — Inpatient Hospital Stay (HOSPITAL_COMMUNITY): Payer: Self-pay | Admitting: Certified Registered Nurse Anesthetist

## 2018-01-13 ENCOUNTER — Inpatient Hospital Stay (HOSPITAL_COMMUNITY)
Admission: RE | Admit: 2018-01-13 | Discharge: 2018-01-19 | DRG: 460 | Disposition: A | Discharge status: Home Health | Payer: Self-pay | Source: Ambulatory Visit | Attending: Specialist | Admitting: Specialist

## 2018-01-13 ENCOUNTER — Encounter (HOSPITAL_COMMUNITY): Payer: Self-pay | Admitting: *Deleted

## 2018-01-13 ENCOUNTER — Inpatient Hospital Stay (HOSPITAL_COMMUNITY): Payer: Self-pay

## 2018-01-13 ENCOUNTER — Encounter (HOSPITAL_COMMUNITY)
Admission: RE | Disposition: A | Discharge status: Home Health | Payer: Self-pay | Source: Ambulatory Visit | Attending: Specialist

## 2018-01-13 DIAGNOSIS — Y848 Other medical procedures as the cause of abnormal reaction of the patient, or of later complication, without mention of misadventure at the time of the procedure: Secondary | ICD-10-CM | POA: Diagnosis not present

## 2018-01-13 DIAGNOSIS — M48062 Spinal stenosis, lumbar region with neurogenic claudication: Secondary | ICD-10-CM

## 2018-01-13 DIAGNOSIS — M4326 Fusion of spine, lumbar region: Secondary | ICD-10-CM | POA: Diagnosis present

## 2018-01-13 DIAGNOSIS — T8142XA Infection following a procedure, deep incisional surgical site, initial encounter: Secondary | ICD-10-CM | POA: Diagnosis not present

## 2018-01-13 DIAGNOSIS — Y838 Other surgical procedures as the cause of abnormal reaction of the patient, or of later complication, without mention of misadventure at the time of the procedure: Secondary | ICD-10-CM | POA: Diagnosis not present

## 2018-01-13 DIAGNOSIS — Z7982 Long term (current) use of aspirin: Secondary | ICD-10-CM

## 2018-01-13 DIAGNOSIS — Z79891 Long term (current) use of opiate analgesic: Secondary | ICD-10-CM

## 2018-01-13 DIAGNOSIS — M4807 Spinal stenosis, lumbosacral region: Secondary | ICD-10-CM | POA: Diagnosis present

## 2018-01-13 DIAGNOSIS — J449 Chronic obstructive pulmonary disease, unspecified: Secondary | ICD-10-CM | POA: Diagnosis present

## 2018-01-13 DIAGNOSIS — M48061 Spinal stenosis, lumbar region without neurogenic claudication: Principal | ICD-10-CM | POA: Diagnosis present

## 2018-01-13 DIAGNOSIS — E782 Mixed hyperlipidemia: Secondary | ICD-10-CM | POA: Diagnosis present

## 2018-01-13 DIAGNOSIS — I248 Other forms of acute ischemic heart disease: Secondary | ICD-10-CM | POA: Diagnosis present

## 2018-01-13 DIAGNOSIS — B9562 Methicillin resistant Staphylococcus aureus infection as the cause of diseases classified elsewhere: Secondary | ICD-10-CM | POA: Diagnosis not present

## 2018-01-13 DIAGNOSIS — M4316 Spondylolisthesis, lumbar region: Secondary | ICD-10-CM | POA: Diagnosis present

## 2018-01-13 DIAGNOSIS — Z419 Encounter for procedure for purposes other than remedying health state, unspecified: Secondary | ICD-10-CM

## 2018-01-13 DIAGNOSIS — D62 Acute posthemorrhagic anemia: Secondary | ICD-10-CM | POA: Diagnosis not present

## 2018-01-13 DIAGNOSIS — Z8349 Family history of other endocrine, nutritional and metabolic diseases: Secondary | ICD-10-CM

## 2018-01-13 DIAGNOSIS — R079 Chest pain, unspecified: Secondary | ICD-10-CM

## 2018-01-13 DIAGNOSIS — N281 Cyst of kidney, acquired: Secondary | ICD-10-CM | POA: Diagnosis present

## 2018-01-13 DIAGNOSIS — M7138 Other bursal cyst, other site: Secondary | ICD-10-CM | POA: Diagnosis present

## 2018-01-13 DIAGNOSIS — Y92239 Unspecified place in hospital as the place of occurrence of the external cause: Secondary | ICD-10-CM | POA: Diagnosis not present

## 2018-01-13 DIAGNOSIS — M4056 Lordosis, unspecified, lumbar region: Secondary | ICD-10-CM | POA: Diagnosis present

## 2018-01-13 DIAGNOSIS — M869 Osteomyelitis, unspecified: Secondary | ICD-10-CM

## 2018-01-13 DIAGNOSIS — M4626 Osteomyelitis of vertebra, lumbar region: Secondary | ICD-10-CM | POA: Diagnosis present

## 2018-01-13 DIAGNOSIS — T8089XA Other complications following infusion, transfusion and therapeutic injection, initial encounter: Secondary | ICD-10-CM | POA: Diagnosis not present

## 2018-01-13 DIAGNOSIS — B182 Chronic viral hepatitis C: Secondary | ICD-10-CM | POA: Diagnosis present

## 2018-01-13 DIAGNOSIS — R7881 Bacteremia: Secondary | ICD-10-CM | POA: Diagnosis not present

## 2018-01-13 DIAGNOSIS — K5900 Constipation, unspecified: Secondary | ICD-10-CM | POA: Diagnosis not present

## 2018-01-13 DIAGNOSIS — K219 Gastro-esophageal reflux disease without esophagitis: Secondary | ICD-10-CM | POA: Diagnosis present

## 2018-01-13 DIAGNOSIS — Z825 Family history of asthma and other chronic lower respiratory diseases: Secondary | ICD-10-CM

## 2018-01-13 DIAGNOSIS — Z87891 Personal history of nicotine dependence: Secondary | ICD-10-CM

## 2018-01-13 DIAGNOSIS — Y92230 Patient room in hospital as the place of occurrence of the external cause: Secondary | ICD-10-CM | POA: Diagnosis not present

## 2018-01-13 DIAGNOSIS — I25119 Atherosclerotic heart disease of native coronary artery with unspecified angina pectoris: Secondary | ICD-10-CM | POA: Diagnosis not present

## 2018-01-13 DIAGNOSIS — M5416 Radiculopathy, lumbar region: Secondary | ICD-10-CM | POA: Diagnosis present

## 2018-01-13 HISTORY — DX: Unspecified osteoarthritis, unspecified site: M19.90

## 2018-01-13 HISTORY — DX: Gastro-esophageal reflux disease without esophagitis: K21.9

## 2018-01-13 HISTORY — DX: Unspecified convulsions: R56.9

## 2018-01-13 HISTORY — DX: Headache: R51

## 2018-01-13 HISTORY — PX: LUMBAR FUSION: SHX111

## 2018-01-13 HISTORY — DX: Pneumonia, unspecified organism: J18.9

## 2018-01-13 HISTORY — DX: Inflammatory liver disease, unspecified: K75.9

## 2018-01-13 HISTORY — DX: Headache, unspecified: R51.9

## 2018-01-13 LAB — COMPREHENSIVE METABOLIC PANEL
ALBUMIN: 3.2 g/dL — AB (ref 3.5–5.0)
ALK PHOS: 88 U/L (ref 38–126)
ALT: 14 U/L (ref 14–54)
AST: 19 U/L (ref 15–41)
Anion gap: 13 (ref 5–15)
BUN: 19 mg/dL (ref 6–20)
CALCIUM: 8.8 mg/dL — AB (ref 8.9–10.3)
CHLORIDE: 103 mmol/L (ref 101–111)
CO2: 21 mmol/L — AB (ref 22–32)
CREATININE: 0.81 mg/dL (ref 0.44–1.00)
GFR calc Af Amer: >60 mL/min (ref >60)
GFR calc non Af Amer: >60 mL/min (ref >60)
GLUCOSE: 107 mg/dL — AB (ref 65–99)
Potassium: 3.6 mmol/L (ref 3.5–5.1)
SODIUM: 137 mmol/L (ref 135–145)
Total Bilirubin: 0.5 mg/dL (ref 0.3–1.2)
Total Protein: 6.6 g/dL (ref 6.5–8.1)

## 2018-01-13 LAB — BASIC METABOLIC PANEL
Anion gap: 9 (ref 5–15)
BUN: 8 mg/dL (ref 6–20)
CALCIUM: 7.8 mg/dL — AB (ref 8.9–10.3)
CO2: 22 mmol/L (ref 22–32)
CREATININE: 0.81 mg/dL (ref 0.44–1.00)
Chloride: 108 mmol/L (ref 101–111)
GFR calc Af Amer: >60 mL/min (ref >60)
GFR calc non Af Amer: >60 mL/min (ref >60)
GLUCOSE: 190 mg/dL — AB (ref 65–99)
Potassium: 3.6 mmol/L (ref 3.5–5.1)
Sodium: 139 mmol/L (ref 135–145)

## 2018-01-13 LAB — CBC
HCT: 42.2 % (ref 36.0–46.0)
HEMATOCRIT: 29.2 % — AB (ref 36.0–46.0)
HEMOGLOBIN: 13.7 g/dL (ref 12.0–15.0)
Hemoglobin: 9.5 g/dL — ABNORMAL LOW (ref 12.0–15.0)
MCH: 30 pg (ref 26.0–34.0)
MCH: 30.4 pg (ref 26.0–34.0)
MCHC: 32.5 g/dL (ref 30.0–36.0)
MCHC: 32.5 g/dL (ref 30.0–36.0)
MCV: 92.5 fL (ref 78.0–100.0)
MCV: 93.6 fL (ref 78.0–100.0)
Platelets: 416 10*3/uL — ABNORMAL HIGH (ref 150–400)
Platelets: 286 10*3/uL (ref 150–400)
RBC: 4.56 MIL/uL (ref 3.87–5.11)
RBC: 3.12 MIL/uL — ABNORMAL LOW (ref 3.87–5.11)
RDW: 14.3 % (ref 11.5–15.5)
RDW: 14.7 % (ref 11.5–15.5)
WBC: 9.7 10*3/uL (ref 4.0–10.5)
WBC: 11.7 10*3/uL — AB (ref 4.0–10.5)

## 2018-01-13 LAB — URINALYSIS, ROUTINE W REFLEX MICROSCOPIC
Bilirubin Urine: NEGATIVE
GLUCOSE, UA: NEGATIVE mg/dL
KETONES UR: 5 mg/dL — AB
NITRITE: NEGATIVE
PH: 5 (ref 5.0–8.0)
Protein, ur: NEGATIVE mg/dL
SPECIFIC GRAVITY, URINE: 1.027 (ref 1.005–1.030)

## 2018-01-13 LAB — APTT: aPTT: 27 seconds (ref 24–36)

## 2018-01-13 LAB — SURGICAL PCR SCREEN
MRSA, PCR: NEGATIVE
STAPHYLOCOCCUS AUREUS: NEGATIVE

## 2018-01-13 LAB — PROTIME-INR
INR: 1.1
Prothrombin Time: 14.1 seconds (ref 11.4–15.2)

## 2018-01-13 SURGERY — POSTERIOR LUMBAR FUSION 1 LEVEL
Anesthesia: General | Site: Spine Lumbar

## 2018-01-13 MED ORDER — OXYCODONE HCL ER 10 MG PO T12A
20.0000 mg | EXTENDED_RELEASE_TABLET | Freq: Two times a day (BID) | ORAL | Status: DC
  Administered 2018-01-13 – 2018-01-14 (×2): 20 mg via ORAL
  Filled 2018-01-13 (×2): qty 2

## 2018-01-13 MED ORDER — FLEET ENEMA 7-19 GM/118ML RE ENEM: 1.0000 | ENEMA | Freq: Once | RECTAL | Status: DC | PRN

## 2018-01-13 MED ORDER — KETAMINE HCL 10 MG/ML IJ SOLN
INTRAMUSCULAR | Status: AC
  Filled 2018-01-13: qty 1

## 2018-01-13 MED ORDER — CEFAZOLIN SODIUM-DEXTROSE 2-4 GM/100ML-% IV SOLN
2.0000 g | INTRAVENOUS | Status: AC
  Administered 2018-01-13: 2 g via INTRAVENOUS
  Filled 2018-01-13: qty 100

## 2018-01-13 MED ORDER — HYDROMORPHONE HCL 1 MG/ML IJ SOLN
0.2500 mg | INTRAMUSCULAR | Status: DC | PRN
  Administered 2018-01-13: 0.25 mg via INTRAVENOUS

## 2018-01-13 MED ORDER — LORAZEPAM 1 MG PO TABS: 1.0000 mg | ORAL_TABLET | Freq: Every day | ORAL | Status: DC | PRN

## 2018-01-13 MED ORDER — BUPIVACAINE LIPOSOME 1.3 % IJ SUSP
20.0000 mL | Freq: Once | INTRAMUSCULAR | Status: DC
  Filled 2018-01-13: qty 20

## 2018-01-13 MED ORDER — CHLORHEXIDINE GLUCONATE 4 % EX LIQD: 60.0000 mL | Freq: Once | CUTANEOUS | Status: DC

## 2018-01-13 MED ORDER — OXYCODONE HCL 5 MG PO TABS
5.0000 mg | ORAL_TABLET | ORAL | Status: DC | PRN
  Administered 2018-01-14: 5 mg via ORAL
  Filled 2018-01-13: qty 1

## 2018-01-13 MED ORDER — MENTHOL 3 MG MT LOZG: 1.0000 | LOZENGE | OROMUCOSAL | Status: DC | PRN

## 2018-01-13 MED ORDER — 0.9 % SODIUM CHLORIDE (POUR BTL) OPTIME
TOPICAL | Status: DC | PRN
  Administered 2018-01-13 (×2): 1000 mL

## 2018-01-13 MED ORDER — KETAMINE HCL 10 MG/ML IJ SOLN
INTRAMUSCULAR | Status: DC | PRN
  Administered 2018-01-13: 30 mg via INTRAVENOUS
  Administered 2018-01-13 (×4): 5 mg via INTRAVENOUS

## 2018-01-13 MED ORDER — SODIUM CHLORIDE 0.9 % IV BOLUS
500.0000 mL | Freq: Once | INTRAVENOUS | Status: AC
  Administered 2018-01-13: 500 mL via INTRAVENOUS

## 2018-01-13 MED ORDER — BUPIVACAINE LIPOSOME 1.3 % IJ SUSP
INTRAMUSCULAR | Status: DC | PRN
  Administered 2018-01-13: 20 mL

## 2018-01-13 MED ORDER — ACETAMINOPHEN 10 MG/ML IV SOLN
INTRAVENOUS | Status: DC | PRN
  Administered 2018-01-13: 1000 mg via INTRAVENOUS

## 2018-01-13 MED ORDER — SODIUM CHLORIDE 0.9 % IV SOLN
INTRAVENOUS | Status: DC
  Administered 2018-01-13 – 2018-01-16 (×8): via INTRAVENOUS

## 2018-01-13 MED ORDER — CEFAZOLIN SODIUM-DEXTROSE 1-4 GM/50ML-% IV SOLN
1.0000 g | Freq: Three times a day (TID) | INTRAVENOUS | Status: AC
  Administered 2018-01-13: 1 g via INTRAVENOUS
  Filled 2018-01-13 (×2): qty 50

## 2018-01-13 MED ORDER — MIDAZOLAM HCL 2 MG/2ML IJ SOLN
INTRAMUSCULAR | Status: AC
  Filled 2018-01-13: qty 2

## 2018-01-13 MED ORDER — METHOCARBAMOL 500 MG PO TABS
500.0000 mg | ORAL_TABLET | Freq: Four times a day (QID) | ORAL | Status: DC | PRN
  Administered 2018-01-13 – 2018-01-14 (×2): 500 mg via ORAL
  Filled 2018-01-13 (×2): qty 1

## 2018-01-13 MED ORDER — THROMBIN (RECOMBINANT) 20000 UNITS EX SOLR
CUTANEOUS | Status: AC
  Filled 2018-01-13: qty 20000

## 2018-01-13 MED ORDER — PROMETHAZINE HCL 25 MG/ML IJ SOLN: 6.2500 mg | INTRAMUSCULAR | Status: DC | PRN

## 2018-01-13 MED ORDER — SODIUM CHLORIDE 0.9 % IV SOLN
INTRAVENOUS | Status: DC | PRN
  Administered 2018-01-13: 12:00:00 via INTRAVENOUS

## 2018-01-13 MED ORDER — FENTANYL CITRATE (PF) 250 MCG/5ML IJ SOLN
INTRAMUSCULAR | Status: AC
  Filled 2018-01-13: qty 5

## 2018-01-13 MED ORDER — PHENOL 1.4 % MT LIQD: 1.0000 | OROMUCOSAL | Status: DC | PRN

## 2018-01-13 MED ORDER — ACETAMINOPHEN 325 MG PO TABS
650.0000 mg | ORAL_TABLET | ORAL | Status: DC | PRN
  Administered 2018-01-14 – 2018-01-15 (×6): 650 mg via ORAL
  Filled 2018-01-13 (×6): qty 2

## 2018-01-13 MED ORDER — PHENYLEPHRINE 40 MCG/ML (10ML) SYRINGE FOR IV PUSH (FOR BLOOD PRESSURE SUPPORT)
PREFILLED_SYRINGE | INTRAVENOUS | Status: DC | PRN
  Administered 2018-01-13: 80 ug via INTRAVENOUS
  Administered 2018-01-13 (×2): 40 ug via INTRAVENOUS
  Administered 2018-01-13 (×3): 80 ug via INTRAVENOUS

## 2018-01-13 MED ORDER — GABAPENTIN 300 MG PO CAPS
300.0000 mg | ORAL_CAPSULE | Freq: Three times a day (TID) | ORAL | Status: DC
  Administered 2018-01-13 – 2018-01-19 (×18): 300 mg via ORAL
  Filled 2018-01-13 (×18): qty 1

## 2018-01-13 MED ORDER — ONDANSETRON HCL 4 MG/2ML IJ SOLN
INTRAMUSCULAR | Status: AC
  Filled 2018-01-13: qty 2

## 2018-01-13 MED ORDER — METHOCARBAMOL 1000 MG/10ML IJ SOLN: 500.0000 mg | Freq: Four times a day (QID) | INTRAVENOUS | Status: DC | PRN

## 2018-01-13 MED ORDER — DEXAMETHASONE SODIUM PHOSPHATE 10 MG/ML IJ SOLN
INTRAMUSCULAR | Status: AC
  Filled 2018-01-13: qty 1

## 2018-01-13 MED ORDER — PANTOPRAZOLE SODIUM 40 MG IV SOLR
40.0000 mg | Freq: Every day | INTRAVENOUS | Status: DC
  Administered 2018-01-13: 40 mg via INTRAVENOUS
  Filled 2018-01-13: qty 40

## 2018-01-13 MED ORDER — BUPIVACAINE HCL 0.25 % IJ SOLN
INTRAMUSCULAR | Status: DC | PRN
  Administered 2018-01-13: 20 mL

## 2018-01-13 MED ORDER — PHENYLEPHRINE 40 MCG/ML (10ML) SYRINGE FOR IV PUSH (FOR BLOOD PRESSURE SUPPORT)
PREFILLED_SYRINGE | INTRAVENOUS | Status: AC
  Filled 2018-01-13: qty 10

## 2018-01-13 MED ORDER — ROCURONIUM BROMIDE 10 MG/ML (PF) SYRINGE
PREFILLED_SYRINGE | INTRAVENOUS | Status: AC
  Filled 2018-01-13: qty 5

## 2018-01-13 MED ORDER — DEXMEDETOMIDINE HCL IN NACL 200 MCG/50ML IV SOLN
INTRAVENOUS | Status: DC | PRN
  Administered 2018-01-13: 8 ug via INTRAVENOUS
  Administered 2018-01-13: 4 ug via INTRAVENOUS
  Administered 2018-01-13: 8 ug via INTRAVENOUS

## 2018-01-13 MED ORDER — SODIUM CHLORIDE 0.9 % IV BOLUS: 500.0000 mL | Freq: Once | INTRAVENOUS | Status: DC

## 2018-01-13 MED ORDER — SODIUM CHLORIDE 0.9 % IV SOLN
250.0000 mL | INTRAVENOUS | Status: DC
  Administered 2018-01-13: 250 mL via INTRAVENOUS
  Administered 2018-01-14: 100 mL via INTRAVENOUS

## 2018-01-13 MED ORDER — LACTATED RINGERS IV BOLUS
500.0000 mL | Freq: Once | INTRAVENOUS | Status: AC
  Administered 2018-01-13: 500 mL via INTRAVENOUS

## 2018-01-13 MED ORDER — BISACODYL 5 MG PO TBEC
5.0000 mg | DELAYED_RELEASE_TABLET | Freq: Every day | ORAL | Status: DC | PRN
  Administered 2018-01-16: 5 mg via ORAL
  Filled 2018-01-13: qty 1

## 2018-01-13 MED ORDER — OXYCODONE HCL 5 MG PO TABS
10.0000 mg | ORAL_TABLET | ORAL | Status: DC | PRN
  Administered 2018-01-13 – 2018-01-19 (×32): 10 mg via ORAL
  Filled 2018-01-13 (×32): qty 2

## 2018-01-13 MED ORDER — MORPHINE SULFATE (PF) 4 MG/ML IV SOLN
1.0000 mg | INTRAVENOUS | Status: DC | PRN
  Administered 2018-01-13 – 2018-01-18 (×24): 1 mg via INTRAVENOUS
  Filled 2018-01-13 (×24): qty 1

## 2018-01-13 MED ORDER — ROCURONIUM BROMIDE 100 MG/10ML IV SOLN
INTRAVENOUS | Status: DC | PRN
  Administered 2018-01-13: 20 mg via INTRAVENOUS
  Administered 2018-01-13: 10 mg via INTRAVENOUS
  Administered 2018-01-13 (×2): 20 mg via INTRAVENOUS
  Administered 2018-01-13: 40 mg via INTRAVENOUS

## 2018-01-13 MED ORDER — ONDANSETRON HCL 4 MG/2ML IJ SOLN
4.0000 mg | Freq: Four times a day (QID) | INTRAMUSCULAR | Status: DC | PRN
  Administered 2018-01-14 – 2018-01-17 (×5): 4 mg via INTRAVENOUS
  Filled 2018-01-13 (×5): qty 2

## 2018-01-13 MED ORDER — NITROGLYCERIN 0.4 MG SL SUBL: 0.4000 mg | SUBLINGUAL_TABLET | SUBLINGUAL | Status: DC | PRN

## 2018-01-13 MED ORDER — PANTOPRAZOLE SODIUM 40 MG PO TBEC
40.0000 mg | DELAYED_RELEASE_TABLET | Freq: Every day | ORAL | Status: DC
  Administered 2018-01-14 – 2018-01-19 (×6): 40 mg via ORAL
  Filled 2018-01-13 (×6): qty 1

## 2018-01-13 MED ORDER — DOCUSATE SODIUM 100 MG PO CAPS
100.0000 mg | ORAL_CAPSULE | Freq: Two times a day (BID) | ORAL | Status: DC
  Administered 2018-01-13 – 2018-01-19 (×12): 100 mg via ORAL
  Filled 2018-01-13 (×12): qty 1

## 2018-01-13 MED ORDER — ONDANSETRON HCL 4 MG/2ML IJ SOLN
INTRAMUSCULAR | Status: DC | PRN
  Administered 2018-01-13: 4 mg via INTRAVENOUS

## 2018-01-13 MED ORDER — ONDANSETRON HCL 4 MG PO TABS
4.0000 mg | ORAL_TABLET | Freq: Four times a day (QID) | ORAL | Status: DC | PRN
  Administered 2018-01-15: 4 mg via ORAL
  Filled 2018-01-13: qty 1

## 2018-01-13 MED ORDER — LIDOCAINE HCL (CARDIAC) 20 MG/ML IV SOLN
INTRAVENOUS | Status: AC
  Filled 2018-01-13: qty 5

## 2018-01-13 MED ORDER — ARTIFICIAL TEARS OPHTHALMIC OINT
TOPICAL_OINTMENT | OPHTHALMIC | Status: AC
  Filled 2018-01-13: qty 3.5

## 2018-01-13 MED ORDER — PRAVASTATIN SODIUM 20 MG PO TABS
10.0000 mg | ORAL_TABLET | Freq: Every day | ORAL | Status: DC
  Administered 2018-01-16: 10 mg via ORAL
  Filled 2018-01-13 (×3): qty 1

## 2018-01-13 MED ORDER — SUGAMMADEX SODIUM 200 MG/2ML IV SOLN
INTRAVENOUS | Status: DC | PRN
  Administered 2018-01-13: 200 mg via INTRAVENOUS

## 2018-01-13 MED ORDER — ALBUMIN HUMAN 5 % IV SOLN
INTRAVENOUS | Status: AC
  Filled 2018-01-13: qty 250

## 2018-01-13 MED ORDER — LACTATED RINGERS IV SOLN
INTRAVENOUS | Status: DC | PRN
  Administered 2018-01-13 (×2): via INTRAVENOUS

## 2018-01-13 MED ORDER — BUPIVACAINE HCL (PF) 0.25 % IJ SOLN
INTRAMUSCULAR | Status: AC
  Filled 2018-01-13: qty 30

## 2018-01-13 MED ORDER — ACETAMINOPHEN 650 MG RE SUPP: 650.0000 mg | RECTAL | Status: DC | PRN

## 2018-01-13 MED ORDER — PROPOFOL 10 MG/ML IV BOLUS
INTRAVENOUS | Status: AC
  Filled 2018-01-13: qty 40

## 2018-01-13 MED ORDER — HYDROMORPHONE HCL 1 MG/ML IJ SOLN
INTRAMUSCULAR | Status: AC
  Filled 2018-01-13: qty 1

## 2018-01-13 MED ORDER — FENTANYL CITRATE (PF) 100 MCG/2ML IJ SOLN
INTRAMUSCULAR | Status: DC | PRN
  Administered 2018-01-13 (×2): 50 ug via INTRAVENOUS
  Administered 2018-01-13: 100 ug via INTRAVENOUS
  Administered 2018-01-13 (×3): 50 ug via INTRAVENOUS

## 2018-01-13 MED ORDER — LIDOCAINE HCL (CARDIAC) 20 MG/ML IV SOLN
INTRAVENOUS | Status: DC | PRN
  Administered 2018-01-13: 60 mg via INTRAVENOUS

## 2018-01-13 MED ORDER — ALUM & MAG HYDROXIDE-SIMETH 200-200-20 MG/5ML PO SUSP
30.0000 mL | Freq: Four times a day (QID) | ORAL | Status: DC | PRN
  Administered 2018-01-14: 30 mL via ORAL
  Filled 2018-01-13: qty 30

## 2018-01-13 MED ORDER — MIDAZOLAM HCL 5 MG/5ML IJ SOLN
INTRAMUSCULAR | Status: DC | PRN
  Administered 2018-01-13: 2 mg via INTRAVENOUS

## 2018-01-13 MED ORDER — PROPOFOL 10 MG/ML IV BOLUS
INTRAVENOUS | Status: DC | PRN
  Administered 2018-01-13: 50 mg via INTRAVENOUS
  Administered 2018-01-13: 100 mg via INTRAVENOUS

## 2018-01-13 MED ORDER — METOPROLOL TARTRATE 12.5 MG HALF TABLET
12.5000 mg | ORAL_TABLET | Freq: Two times a day (BID) | ORAL | Status: DC
  Administered 2018-01-19: 12.5 mg via ORAL
  Filled 2018-01-13 (×7): qty 1

## 2018-01-13 MED ORDER — POLYETHYLENE GLYCOL 3350 17 G PO PACK
17.0000 g | PACK | Freq: Every day | ORAL | Status: DC | PRN
  Filled 2018-01-13: qty 1

## 2018-01-13 MED ORDER — SODIUM CHLORIDE 0.9% FLUSH
3.0000 mL | Freq: Two times a day (BID) | INTRAVENOUS | Status: DC
  Administered 2018-01-15 – 2018-01-18 (×5): 3 mL via INTRAVENOUS

## 2018-01-13 MED ORDER — THROMBIN (RECOMBINANT) 20000 UNITS EX SOLR
CUTANEOUS | Status: DC | PRN
  Administered 2018-01-13: 09:00:00 via TOPICAL

## 2018-01-13 MED ORDER — ASPIRIN EC 81 MG PO TBEC
81.0000 mg | DELAYED_RELEASE_TABLET | Freq: Every day | ORAL | Status: DC
  Administered 2018-01-14 – 2018-01-19 (×6): 81 mg via ORAL
  Filled 2018-01-13 (×6): qty 1

## 2018-01-13 MED ORDER — DEXTROSE 5 % IV SOLN
INTRAVENOUS | Status: DC | PRN
  Administered 2018-01-13: 25 ug/min via INTRAVENOUS

## 2018-01-13 MED ORDER — ACETAMINOPHEN 10 MG/ML IV SOLN
INTRAVENOUS | Status: AC
  Filled 2018-01-13: qty 100

## 2018-01-13 MED ORDER — ALBUMIN HUMAN 5 % IV SOLN
12.5000 g | Freq: Once | INTRAVENOUS | Status: AC
  Administered 2018-01-13: 12.5 g via INTRAVENOUS

## 2018-01-13 MED ORDER — DEXAMETHASONE SODIUM PHOSPHATE 10 MG/ML IJ SOLN
INTRAMUSCULAR | Status: DC | PRN
  Administered 2018-01-13: 10 mg via INTRAVENOUS

## 2018-01-13 MED ORDER — ARTIFICIAL TEARS OPHTHALMIC OINT
TOPICAL_OINTMENT | OPHTHALMIC | Status: DC | PRN
  Administered 2018-01-13: 1 via OPHTHALMIC

## 2018-01-13 MED ORDER — SODIUM CHLORIDE 0.9% FLUSH: 3.0000 mL | INTRAVENOUS | Status: DC | PRN

## 2018-01-13 MED ORDER — ALBUMIN HUMAN 5 % IV SOLN
INTRAVENOUS | Status: DC | PRN
  Administered 2018-01-13: 12:00:00 via INTRAVENOUS

## 2018-01-13 SURGICAL SUPPLY — 79 items
BENZOIN TINCTURE PRP APPL 2/3 (GAUZE/BANDAGES/DRESSINGS) ×3 IMPLANT
BLADE CLIPPER SURG (BLADE) IMPLANT
BONE CHIP PRESERV 40CC PCAN1/2 (Bone Implant) ×3 IMPLANT
BONE VIVIGEN FORMABLE 5.4CC (Bone Implant) ×3 IMPLANT
BUR MATCHSTICK NEURO 3.0 LAGG (BURR) ×3 IMPLANT
BUR SABER RD CUTTING 3.0 (BURR) ×2 IMPLANT
BUR SABER RD CUTTING 3.0MM (BURR) ×1
CAGE CONCORDE LIFT 11X21 (Cage) ×2 IMPLANT
CAGE CONCORDE LIFT 11X21MM (Cage) ×1 IMPLANT
CLOSURE WOUND 1/2 X4 (GAUZE/BANDAGES/DRESSINGS) ×1
COVER BACK TABLE 80X110 HD (DRAPES) ×3 IMPLANT
COVER SURGICAL LIGHT HANDLE (MISCELLANEOUS) ×3 IMPLANT
DECANTER SPIKE VIAL GLASS SM (MISCELLANEOUS) ×3 IMPLANT
DERMABOND ADVANCED (GAUZE/BANDAGES/DRESSINGS) ×2
DERMABOND ADVANCED .7 DNX12 (GAUZE/BANDAGES/DRESSINGS) ×1 IMPLANT
DRAPE C-ARM 42X72 X-RAY (DRAPES) ×3 IMPLANT
DRAPE C-ARMOR (DRAPES) ×3 IMPLANT
DRAPE MICROSCOPE LEICA (MISCELLANEOUS) ×3 IMPLANT
DRAPE POUCH INSTRU U-SHP 10X18 (DRAPES) ×3 IMPLANT
DRAPE SURG 17X23 STRL (DRAPES) ×12 IMPLANT
DRSG MEPILEX BORDER 4X4 (GAUZE/BANDAGES/DRESSINGS) IMPLANT
DRSG MEPILEX BORDER 4X8 (GAUZE/BANDAGES/DRESSINGS) ×3 IMPLANT
DURAPREP 26ML APPLICATOR (WOUND CARE) ×3 IMPLANT
ELECT BLADE 4.0 EZ CLEAN MEGAD (MISCELLANEOUS) ×3
ELECT BLADE 6.5 EXT (BLADE) ×3 IMPLANT
ELECT CAUTERY BLADE 6.4 (BLADE) ×3 IMPLANT
ELECT REM PT RETURN 9FT ADLT (ELECTROSURGICAL) ×3
ELECTRODE BLDE 4.0 EZ CLN MEGD (MISCELLANEOUS) ×1 IMPLANT
ELECTRODE REM PT RTRN 9FT ADLT (ELECTROSURGICAL) ×1 IMPLANT
EVACUATOR 1/8 PVC DRAIN (DRAIN) IMPLANT
FLOSEAL 10ML (HEMOSTASIS) ×3 IMPLANT
GAUZE SPONGE 4X4 12PLY STRL (GAUZE/BANDAGES/DRESSINGS) ×3 IMPLANT
GLOVE BIOGEL PI IND STRL 8 (GLOVE) ×2 IMPLANT
GLOVE BIOGEL PI INDICATOR 8 (GLOVE) ×4
GLOVE ECLIPSE 9.0 STRL (GLOVE) ×6 IMPLANT
GLOVE ORTHO TXT STRL SZ7.5 (GLOVE) ×6 IMPLANT
GLOVE SURG 8.5 LATEX PF (GLOVE) ×6 IMPLANT
GOWN STRL REUS W/ TWL LRG LVL3 (GOWN DISPOSABLE) ×1 IMPLANT
GOWN STRL REUS W/TWL 2XL LVL3 (GOWN DISPOSABLE) ×6 IMPLANT
GOWN STRL REUS W/TWL LRG LVL3 (GOWN DISPOSABLE) ×2
KIT BASIN OR (CUSTOM PROCEDURE TRAY) ×3 IMPLANT
KIT POSITION SURG JACKSON T1 (MISCELLANEOUS) ×3 IMPLANT
KIT TURNOVER KIT B (KITS) ×3 IMPLANT
NEEDLE 22X1 1/2 (OR ONLY) (NEEDLE) ×3 IMPLANT
NEEDLE SPNL 18GX3.5 QUINCKE PK (NEEDLE) ×3 IMPLANT
NS IRRIG 1000ML POUR BTL (IV SOLUTION) ×3 IMPLANT
PACK LAMINECTOMY ORTHO (CUSTOM PROCEDURE TRAY) ×3 IMPLANT
PAD ARMBOARD 7.5X6 YLW CONV (MISCELLANEOUS) ×6 IMPLANT
PATTIES SURGICAL .75X.75 (GAUZE/BANDAGES/DRESSINGS) ×3 IMPLANT
PATTIES SURGICAL 1X1 (DISPOSABLE) ×3 IMPLANT
ROD PRE BENT EXP 40MM (Rod) ×3 IMPLANT
ROD PRE BENT EXPEDIUM 35MM (Rod) ×3 IMPLANT
SCREW CORT FIX FEN 5.5X7X40MM (Screw) ×6 IMPLANT
SCREW SET SINGLE INNER (Screw) ×12 IMPLANT
SCREW VIPER 7X45MM (Screw) ×6 IMPLANT
SPOGE SURGIFLO 8M (HEMOSTASIS)
SPONGE LAP 4X18 X RAY DECT (DISPOSABLE) ×9 IMPLANT
SPONGE SURGIFLO 8M (HEMOSTASIS) IMPLANT
SPONGE SURGIFOAM ABS GEL 100 (HEMOSTASIS) ×3 IMPLANT
STRIP CLOSURE SKIN 1/2X4 (GAUZE/BANDAGES/DRESSINGS) ×2 IMPLANT
SUT VIC AB 0 CT1 27 (SUTURE) ×4
SUT VIC AB 0 CT1 27XBRD ANBCTR (SUTURE) ×2 IMPLANT
SUT VIC AB 1 CTX 36 (SUTURE) ×4
SUT VIC AB 1 CTX36XBRD ANBCTR (SUTURE) ×2 IMPLANT
SUT VIC AB 2-0 CT1 27 (SUTURE) ×2
SUT VIC AB 2-0 CT1 TAPERPNT 27 (SUTURE) ×1 IMPLANT
SUT VIC AB 3-0 X1 27 (SUTURE) ×3 IMPLANT
SWAB CULTURE LIQ STUART DBL (MISCELLANEOUS) ×3 IMPLANT
SYR 20CC LL (SYRINGE) ×3 IMPLANT
SYR CONTROL 10ML LL (SYRINGE) ×9 IMPLANT
TAP CANN VIPER2 DL 5.0 (TAP) ×3 IMPLANT
TAP CANN VIPER2 DL 6.0 (TAP) ×3 IMPLANT
TAP CANN VIPER2 DL 7.0 (TAP) ×3 IMPLANT
TAP VIPER MIS 4.35MM (TAP) ×3 IMPLANT
TOWEL GREEN STERILE (TOWEL DISPOSABLE) ×3 IMPLANT
TOWEL GREEN STERILE FF (TOWEL DISPOSABLE) ×3 IMPLANT
TRAY FOLEY W/METER SILVER 16FR (SET/KITS/TRAYS/PACK) ×3 IMPLANT
WATER STERILE IRR 1000ML POUR (IV SOLUTION) ×3 IMPLANT
YANKAUER SUCT BULB TIP NO VENT (SUCTIONS) ×3 IMPLANT

## 2018-01-13 NOTE — Transfer of Care (Signed)
Immediate Anesthesia Transfer of Care Note  Patient: [name]  Procedure(s) Performed: Transforaminal lumbar interbody fusion L4-5 with screws, cages and rods, local bone graft, Allograft, Vivigen, Decompression L4-5 and L5-S1 (N/A Spine Lumbar)  Patient Location: PACU  Anesthesia Type:General  Level of Consciousness: awake, alert  and oriented  Airway & Oxygen Therapy: Patient Spontanous Breathing and Patient connected to nasal cannula oxygen  Post-op Assessment: Report given to RN and Post -op Vital signs reviewed and stable  Post vital signs: Reviewed and stable  Last Vitals:  Vitals Value Taken Time  BP 129/78 01/13/2018  1:04 PM  Temp 36.2 C 01/13/2018  1:04 PM  Pulse 78 01/13/2018  1:08 PM  Resp 14 01/13/2018  1:08 PM  SpO2 100 % 01/13/2018  1:08 PM  Vitals shown include unvalidated device data.  Last Pain:  Vitals:   01/13/18 1304  TempSrc:   PainSc: (P) 7       Patients Stated Pain Goal: 2 (01/13/18 0600)  Complications: No apparent anesthesia complications

## 2018-01-13 NOTE — Op Note (Signed)
01/13/2018  1:12 PM  PATIENT:  [name]  62 y.o. female  MRN: [id number]  OPERATIVE REPORT  PRE-OPERATIVE DIAGNOSIS:  L4-5 spondylolisthesis, spinal stenosis right L5-S1 with synovial cysts affecting right L5 and S1 levels  POST-OPERATIVE DIAGNOSIS:  L4-5 spondylolisthesis, spinal stenosis right L5-S1 with synovial cysts affecting right L5 and S1 levels  PROCEDURE:  Procedure(s): Transforaminal lumbar interbody fusion L4-5 with screws, cages and rods, local bone graft, Allograft, Vivigen, Decompression L4-5 and L5-S1    SURGEON:  Jessy Oto, MD     ASSISTANT: Benjiman Core, PA-C  (Present throughout the entire procedure and necessary for completion of procedure in a timely manner)     ANESTHESIA:  General, supplemented with local marcaine 0.5% 1:1 exparel 1.3% total 30cc, Adele Barthel, MD    COMPLICATIONS:  None.  DRAINS: Foley to SD.  EBL 500CC  CELL SAVER RETURNED: 200CC    COMPONENTS:   Implant Name Type Inv. Item Serial No. Manufacturer Lot No. LRB No. Used  BONE CHIP PRESERV 40CC - [id number] Bone Implant BONE CHIP PRESERV 40CC [id number] [facility] [employer] [facility]  N/A 1  BONE VIVIGEN FORMABLE 5.4CC - [id number] Bone Implant BONE VIVIGEN FORMABLE 5.4CC [id number] [employer] [facility]  N/A 1  CAGE CONCORDE LIFT 11X21MM - [id number] Cage CAGE CONCORDE LIFT 11X21MM  [employer] [id number] N/A 1  SCREW VIPER 7X45MM - [id number] Screw SCREW VIPER 7X45MM  [employer]  N/A 2  SCREW CORT FIX FEN 5.5X7X40MM - [id number] Screw SCREW CORT FIX FEN 5.5X7X40MM  [employer]  N/A 2  SCREW SET SINGLE INNER - [id number] Screw SCREW SET SINGLE INNER  [employer]  N/A 4  ROD PRE BENT EXP 40MM - [id number] Rod ROD PRE BENT EXP 40MM  [employer]  N/A 1  ROD PRE BENT EXPEDIUM 35MM - [id number] Rod ROD PRE BENT EXPEDIUM 35MM  [employer]  N/A 1    PROCEDURE:The patient was met in the holding  area, and the appropriate lumbar level right L4-5  identified and marked with an x and my initials.The patient was then transported to OR. The patient was then placed under general anesthesia without difficulty.The patient received appropriate preoperative antibiotic prophylaxis ancef.  Nursing staff inserted a Foley catheter under sterile conditions. She was then turned to a prone position [place] spine table was used for this case. All pressure points were well padded PAS stocking applied bilateral lower extremity to prevent DVT. Standard prep DuraPrep solution. Draped in the usual manner. Time-out procedure was called and correct .   The incision was at L3-S1 and determined using C-arm to mark the inferior L5 pedicles and  an additionally extended distal to allow for exposure of the L5-S1 level for laminectomy to the S2 spinous process.   Bovie electric cautery was used to control bleeding and carefully dissection was carried down along the lateral aspects of the spinous process of L3 to S2. Cobb then used to carefully elevate the paralumbar muscle and the incision in the midline was carried to to the level of the base of the residual spinous processes. The posterior exposure area extending from the base of the spinous process of L3 to the superior aspect of S2 was carried expose at its edges debrided the muscle attachments using a Leskell. Time-out procedure was called and correct. Skin in the midline between L2 and L5 was then infiltrated with local anesthesia, marcaine 1/2% 1:1 exparel 1.3%  total 20 cc used. Incision was then made  extending from L3-S2  through the skin and subcutaneous layers down to the patient's lumbodorsal fascia and spinous processes. The incision then carried sharply excising the supraspinous ligament and then continuing the lateral aspect of the spinous processes of L3,L4 and L5. Cobb elevator used to carefully elevate the paralumbar muscles off of the posterior elements using  electrocautery carefully drilled bleeding and perform dissection of the muscle tissues of the preserving the facet capsule at the L3-4. Continuing the exposure out laterally to expose the lateral margin of the facet joint line at L4-5 and L3-4. Incision was carried in the midline down to the L5 level area bleeders controlled using electrocautery monopolar electrocautery. Insight retractor with sterile upright attachment used.    C-arm fluoroscopy was then brought into the field and using C-arm fluoroscopy then a hole made into the medial aspect of the left pedicle of L4 using a high speed burr observed in the pedicle using C arm at the 5 oclock position on the left L4 pedicle nerve probe initial entry was determined on fluoroscopy to be good position alignment so that a 4.58m tap was passed to 30 mm within the left L4 pedicle to a depth of nearly 45 mm observed on C-arm fluoroscopy to be beyond the midpoint of the lumbar vertebra and then position alignment within the left L4 pedicle this was then removed and the pedicle channel probed demonstrating patency no sign of rupture the cortex of the pedicle. Tapping with a 4.35 mm screw tap then 5 mm tap then a 6 mm and 742mtap a 7.0 mm x 45 mm screw was then inserted on the left side pedicle at the L4 level. C-arm fluoroscopy was then brought into the field and using C-arm fluoroscopy then a hole made into the posterior medial aspect of the pedicle of right L4 observed in the pedicle using ball tipped nerve hook and hockey stick nerve probe initial entry was determined on fluoroscopy to be good position alignment so that 4.33m85map was then used to tap the right L4 pedicle to a depth of nearly 45 mm observed on C-arm fluoroscopy to be beyond the midpoint of the lumbar vertebra and then position alignment within the right L4 pedicle this was then removed and the pedicle channel probed demonstrating patency no sign of rupture the cortex of the pedicle. Tapping with a 5 mm  screw tap then tapping with a 6.0 mm and 7.33mm7mp, a 7.0 mm x 45 mm screw was reserved for later placement on the right side pedicle at the L4 level. placed.C-arm fluoroscopy was then brought into the field and using C-arm fluoroscopy then a hole made into the posterior and medial aspect of the left pedicle of L5 observed in the pedicle using ball tipped nerve hook and hockey stick nerve probe initial entry was determined on fluoroscopy to be good position alignment so that a 4.35 mm tap was then used to tap the left L4 pedicle to a depth of nearly 40 mm observed on C-arm fluoroscopy to be beyond the posterior one third of the lumbar vertebra and good position alignment within the left L5 pedicle this was then removed and the pedicle channel probed demonstrating patency no sign of rupture the cortex of the pedicle. Tapping with a 4.0 mm screw tap then a 5.0 mm tap then tapping with a 6.0 mm and 7.33mm 102m a 7.33mm x38m mm screw was then inserted on the left  side at the L5 level. The pedicle channel of L5 on the left probed demonstrating patency no sign of rupture the cortex of the pedicle.C-arm fluoroscopy was then brought into the field and using C-arm fluoroscopy then a hole made into the posterior and medial aspect of the right pedicle of L5 observed in the pedicle using ball tipped nerve hook and hockey stick nerve probe initial entry was determined on fluoroscopy to be good position alignment so that a 4.25m tap was then used to tap the right L5 pedicle to a depth of nearly 45 mm observed on C-arm fluoroscopy to be beyond the posterior one third of the lumbar vertebra and good position alignment within the right L5 pedicle this was then removed and the pedicle channel probed demonstrating patency no sign of rupture the cortex of the pedicle. Tapping with a 4.351mscrew tap then up to a 54m72map then 7.0mm39m40 mm screw was reserved for later placement on the right side pedicle at the L5 level.. The pedicle  channel of L5 on the right probed demonstrating patency no sign of rupture the cortex of the pedicle. Viper screw for fixation of this level was measured as 7.0 mm x 40 mm screw.  Flow Seal was used for hemostasis of the right L4 and L5 pedicle screw holes.  Cobb elevator was then introduced into the incision site and used to carefully form subperiosteal movement of the bilateral paralumbar muscles off of the posterior lamina of the expected L5-S1 level.  The depth measured off of the Cobb at about 60 mm and 60 mm retractors and placed on the scaffolding for the [facility] and guided down to and docking on the posterior aspect of the lamina at the expected right L5-S1 level. The operating room microscope sterilely draped brought into the field. Under the operating room microscope, the L5-S1 interspace carefully debrided the small amount of muscle attachment here and high-speed bur used to drill the medial aspect of the inferior articular process of L5 approximately 20%. An area of hard muscle fibrous tissue located on the right side at the S1-S2 level was resected and felt to like represent the area of cystic change seen on the preoperative MRI extending from the right L5-S1 facet posteriorly and inferiorly. Further synovial tissue in fronds was resected off the medial and inferior aspect of the right L5-S1 facet.  2 mm Kerrison then used to enter the spinal canal over the superior aspect of the S1 lamina carefully using the Kerrison to debris the attachment as a curet. Foraminotomy was then performed over the right S1 nerve root. The medial 10% superior articular process of S1 and then resected using an osteotome and 2 mm Kerrison. Synovial material was found adherent to the right thecal sac at L5-S1extending from the mid portion of the right L5-S1 facet distally and enveloping the right thecal sac at the L5 and S1 nerve root take off. The plane between the synovial cyst remanent and the thecal  scac identified and a portion of this material freed up Off the posterolateral thecal sac and removed with 15 blade scapel and sent to pathology as a second  Specemen. Additionally soft tissue from about the right L5-S1 facet was sent for Anaerobic and aerobic culture and sensitivites.Penfield 4 was then used to carefully mobilize the thecal sac medially and the S1 nerve root identified within the lateral recess flattened over the posterior aspect of the disc and lateral recess deep to the medial L5-S1  facet. Carefully the lateral aspect of the S1 nerve root was identified and a Penfield 4 was used to mobilize the nerve medially such that the disc was visible with microscope. Using a Penfield 4 to mobilize the right S1 root the disc was examined and determined to not be herniated.Further foraminotomies was performed over the right L5 nerve root the nerve root was noted to be decompressed. The nerve root able to be retracted along the medial aspect of the L5 pedicle and the L5 nerve root noted to have a nearly right angle take off into the right L5 neuroforamen, Ligmentum flavum reflected over the medial and volar aspect of the right L5-S1 facet at this level was further resected current Kerrisons, 43m and 398m  Had a moderate amount of further resection of the L5 lamina inferiorly was performed. With this then the disc space at L5-S1 was easily visualized,ligamentum flavum was debrided and lateral recess along the medial aspect L5-S1 facet no further decompression was necessary. Ball tip nerve probe was then able to carefully palpate the neuroforamen for L4 and L5 finding these to be well decompressed.    Leksell rongeur and 1/4 inch osteotome used to resect inferior aspect of the lamina on the right side at the L4 level. The right medial 40% of the facet of L4-5 were resected in order to decompress the right side of the lumbar thecal sac at L4-5 and the right  L4 and L5 neuroforamen. Osteotomes and 75m67mnd 3mm65merrisons were used for this portion of the decompression. Similarly the right side decompression was carried out but  Near complete facetectomy was perform on the right at L4-5 to provide for exposure of the right side L4-5 neuroforamen for ease of placement of TLIF (transforaminal lumbar interbody fusion) at the L4 level, the inferior portions of the right L4 lamina and pars were also resected first beginning with the Leksell rongeur and osteotomes and then resecting using 2 and 3 mm Kerrison. Continued laminectomy was carried out resecting the central portions of the right lamina of L4 and upper L5 performing foraminotomies on the right side at L4 and L5 levels. The inferior articular process  L4 was resected on the right side.  A large amount of hypertrophic ligmentum flavum was found impressing on the right lateral recesses at L4-5 and narrowing the respective L4 and L5 neuroforamen.The operating room microscope sterilely draped was used for this portion of the case.  Attention then turned to placement of the right transforaminal lumbar interbody fusion cage.  Bleeding controlled using bipolar electrocautery thrombin soaked gel cottonoids. Penfield 4 bipolar electrocautery to control small bleeders present. Derricho retractor used to retract the thecal sac and L4 nerve root a 15 blade scalpel was used to incise posterior lateral aspect of the right L4-5 disc the disc space at this level showed a mild narrowing posteriorly was more open anteriorly so that an osteotome again was used to resect a small portion the posterior superior lip of the vertebral body at L5  in order to gain ease of access into the L4-5 disc space. The space was debrided of degenerative disc material using pituitary along root the entire disc space was then debrided of degenerative disc material using pituitary rongeurs curettage down to bleeding bone endplates. 8mm 69m 9 mm shavers were used to debride the disc space and pituitary ronguers  used to remove the loosened debris. This space was then carefully assess using spacers  a 10.0mm t67ml cage provided the best  fit, the Depuy concorde LIFT cage 15m x 230mas chosen so that the permanent LIFT 9.0 mm cage by 27 mm concorde cage packed with local bone graft and vivigen and cancellous allograft chips were placed into the intervertebral disc space. The posterior intervertebral disc space was then packed with autogenous local bone graft that been harvested from the central laminectomy and vivigen bone graft, allograft. Bleeding controlled using bipolar electrocautery.  Observed on C-arm fluoroscopy to be in good position alignment. The cage at L4-5 was placed anteriorly as best as possible the correct patient's lordosis. The cage was then raised with the inserted screw driver and the LIFT mechanism deployed. The cage was then filled with vivigen bone graft. With this then the transforaminal lumbar interbody fusion portion of the case was completed bleeders were controlled using bipolar electrocautery thrombin-soaked Gelfoam were appropriate.Decortication of the right facet joint carried out at L4-5. These were packed with cancellous local bone graft.  The 2 viper corticofixation screws on the right were each placed and then each fastener carefully aligned  to allow for placement of rods. The right rod was a precontoured 40 mm rod. This was then placed into the pedicle screws on the right extending from L4-L5 each of the caps were carefully placed loosely tightened.Caps onto the right L4 fastener was tightened to 80 foot lbs. Across the left side a precontoured 3550mitanium rod was placed into the L4 and L5  screw fasteners and the upper cap tightened to 80 foot lbs., compression was obtained on the left side between L5 and L4 compressing between the fasteners and tightening the screw caps 85 pounds.Copious amounts of saline solution this was done throughout the case. Cell Saver was used during the case.  200cc of cell saver blood returned to the patient. EBL was 500cc.  Hockey stick neuroprobe was used to probe the neuroforamen bilateral L4 and L5 these were determined to be well decompressed. Permanent C-arm images were obtained in AP and lateral plane and oblique planes. Remaining local bone graft was then applied along both lateral posterior lateral region extending from right L4 to L5 facet bed.Gelfoam was then removed spinal canal. The lumbodorsal musculature carefully exam debrided of any devitalized tissue following removal of self retaining retractors were the bleeders were controlled using electrocautery and the area dorsal lumbar muscle were then approximated in the midline with interrupted #1 Vicryl sutures loose the dorsal fascia was reattached to the spinous process of L3  superiorly and L5  inferiorly this was done with #1 Vicryl sutures. Subcutaneous layers then approximated using interrupted 0 Vicryl sutures and 2-0 Vicryl sutures. Skin was closed with a running subcutaneous stitch of 4-0 Vicryl Dermabond was applied then MedPlex bandage. All instrument and sponge counts were correct. The patient was then returned to a supine position on her bed reactivated extubated and returned to the recovery room in satisfactory condition.    JamBenjiman Core-C perform the duties of assistant surgeon during this case. He was present from the beginning of the case to the end of the case assisting in transfer the patient from his stretcher to the OR table and back to the stretcher at the end of the case. Assisted in careful retraction and suction of the laminectomy site delicate neural structures operating under the operating room microscope. He performed closure of the incision from the fascia to the skin applying the dressing.     @ 01/13/2018,1:12 PM

## 2018-01-13 NOTE — Interval H&P Note (Signed)
History and Physical Interval Note:  01/13/2018 7:30 AM  [name]  has presented today for surgery, with the diagnosis of L4-5 spondylolisthesis, spinal stenosis right L5-S1 with synovial cysts affecting right L5 and S1 levels  The various methods of treatment have been discussed with the patient and family. After consideration of risks, benefits and other options for treatment, the patient has consented to  Procedure(s): Transforaminal lumbar interbody fusion L4-5 with screws, cages and rods, local bone graft, Allograft, Vivigen, Decompression L4-5 and L5-S1 (N/A) as a surgical intervention .  The patient's history has been reviewed, patient examined, no change in status, stable for surgery.  I have reviewed the patient's chart and labs.  Questions were answered to the patient's satisfaction.     Vira BrownsJames Ryu Cerreta

## 2018-01-13 NOTE — Discharge Instructions (Signed)

## 2018-01-13 NOTE — Brief Op Note (Addendum)
01/13/2018  12:54 PM  PATIENT:  [name]  62 y.o. female  PRE-OPERATIVE DIAGNOSIS:  L4-5 spondylolisthesis, spinal stenosis right L5-S1 with synovial cysts affecting right L5 and S1 levels  POST-OPERATIVE DIAGNOSIS:  L4-5 spondylolisthesis, spinal stenosis right L5-S1 with synovial cysts affecting right L5 and S1 levels  PROCEDURE:  Procedure(s): Transforaminal lumbar interbody fusion L4-5 with screws, cages and rods, local bone graft, Allograft, Vivigen, Decompression L4-5 and L5-S1 (N/A)  SURGEON:  Surgeon(s) and Role:    Kerrin Champagne* Athziri Freundlich E, MD - Primary  PHYSICIAN ASSISTANT:Deeanna Beightol Barry Dieneswens, PA-C  ANESTHESIA:   local and general, Dr. Bradley FerrisEllender  EBL:  500 mL   BLOOD ADMINISTERED:200 CC PRBC  DRAINS: Urinary Catheter (Foley)   LOCAL MEDICATIONS USED:  MARCAINE 0.5% 1:1 EXPAREL 1.3% Amount: 30 ml  SPECIMEN:  Source of Specimen:  right S1-2 area paralumbar scar and cystic area, also specimen from right intraspinal canal extradural ?synovial cyst and Aspirate  DISPOSITION OF SPECIMEN:  PATHOLOGY  And additional material collected from the area about the right L5-S1 facet sent for anaerobic and aeribic C&S  COUNTS:  YES  TOURNIQUET:  * No tourniquets in log *  DICTATION: .Dragon Dictation  PLAN OF CARE: Admit to inpatient   PATIENT DISPOSITION:  PACU - hemodynamically stable.   Delay start of Pharmacological VTE agent (>24hrs) due to surgical blood loss or risk of bleeding: yes

## 2018-01-13 NOTE — Anesthesia Procedure Notes (Signed)
Procedure Name: Intubation Date/Time: 01/13/2018 7:46 AM Performed by: Candis Shine, CRNA Pre-anesthesia Checklist: Patient identified, Emergency Drugs available, Suction available and Patient being monitored Patient Re-evaluated:Patient Re-evaluated prior to induction Oxygen Delivery Method: Circle System Utilized Preoxygenation: Pre-oxygenation with 100% oxygen Induction Type: IV induction Ventilation: Mask ventilation without difficulty Laryngoscope Size: Mac and 3 Grade View: Grade I Tube type: Oral Tube size: 7.0 mm Number of attempts: 1 Airway Equipment and Method: Stylet Placement Confirmation: ETT inserted through vocal cords under direct vision,  positive ETCO2 and breath sounds checked- equal and bilateral Secured at: 21 cm Tube secured with: Tape Dental Injury: Teeth and Oropharynx as per pre-operative assessment

## 2018-01-13 NOTE — Anesthesia Postprocedure Evaluation (Signed)
Anesthesia Post Note  Patient: Linda SizerCheryl K Paxton  Procedure(s) Performed: Transforaminal lumbar interbody fusion L4-5 with screws, cages and rods, local bone graft, Allograft, Vivigen, Decompression L4-5 and L5-S1 (N/A Spine Lumbar)     Patient location during evaluation: PACU Anesthesia Type: General Level of consciousness: awake and alert Pain management: pain level controlled Vital Signs Assessment: post-procedure vital signs reviewed and stable Respiratory status: spontaneous breathing, nonlabored ventilation, respiratory function stable and patient connected to nasal cannula oxygen Cardiovascular status: blood pressure returned to baseline and stable Postop Assessment: no apparent nausea or vomiting Anesthetic complications: no    Last Vitals:  Vitals:   01/13/18 1614 01/13/18 1745  BP: (!) 93/57 (!) 99/59  Pulse:    Resp:    Temp:    SpO2:      Last Pain:  Vitals:   01/13/18 1927  TempSrc:   PainSc: 7                  Maheen Cwikla P Loral Campi

## 2018-01-13 NOTE — Progress Notes (Signed)
[facility] Tech Progress Note Patient Details:  [name] 25-Feb-1956 [id number]  Patient ID: [name], female   DOB: 25-Feb-1956, 62 y.o.   MRN: [id number]   Saul FordyceJennifer C Zayin Valadez 01/13/2018, 4:45 PMCalled [employer] for lumbar brace.

## 2018-01-13 NOTE — Progress Notes (Signed)
Patient arrived to 6N21 A&OX4, B/P low but other VSS, IV intact and infusing.  Noted to have hydrocolloid dressing on back.  Family at bedside.  Patient states pain is 10/10. Patient and family oriented to room and equipment.  Will continue to monitor.

## 2018-01-13 NOTE — Progress Notes (Signed)
Patient ID: [name], female   DOB: 1956/03/04, 62 y.o.   MRN: [id number] Call concerning patient post op with low  Blood pressure, she has low blood pressure at the beginning of surgical case and while in the PACU. I will bolus 500 cc NS and increase her IV rate from 100 to 125cc/hr. Obtain CBC and a serum cortisol level .

## 2018-01-14 ENCOUNTER — Inpatient Hospital Stay (HOSPITAL_COMMUNITY): Payer: Self-pay

## 2018-01-14 DIAGNOSIS — F17211 Nicotine dependence, cigarettes, in remission: Secondary | ICD-10-CM

## 2018-01-14 DIAGNOSIS — Z79891 Long term (current) use of opiate analgesic: Secondary | ICD-10-CM

## 2018-01-14 DIAGNOSIS — T8140XA Infection following a procedure, unspecified, initial encounter: Secondary | ICD-10-CM

## 2018-01-14 DIAGNOSIS — Z87440 Personal history of urinary (tract) infections: Secondary | ICD-10-CM

## 2018-01-14 DIAGNOSIS — K5900 Constipation, unspecified: Secondary | ICD-10-CM

## 2018-01-14 DIAGNOSIS — B9561 Methicillin susceptible Staphylococcus aureus infection as the cause of diseases classified elsewhere: Secondary | ICD-10-CM

## 2018-01-14 DIAGNOSIS — B1920 Unspecified viral hepatitis C without hepatic coma: Secondary | ICD-10-CM

## 2018-01-14 DIAGNOSIS — Z981 Arthrodesis status: Secondary | ICD-10-CM

## 2018-01-14 DIAGNOSIS — D649 Anemia, unspecified: Secondary | ICD-10-CM

## 2018-01-14 LAB — CBC WITH DIFFERENTIAL/PLATELET
BASOS ABS: 0 10*3/uL (ref 0.0–0.1)
Basophils Relative: 0 %
EOS PCT: 1 %
Eosinophils Absolute: 0.1 10*3/uL (ref 0.0–0.7)
HCT: 26.7 % — ABNORMAL LOW (ref 36.0–46.0)
HEMOGLOBIN: 8.4 g/dL — AB (ref 12.0–15.0)
LYMPHS ABS: 4.1 10*3/uL — AB (ref 0.7–4.0)
Lymphocytes Relative: 37 %
MCH: 29.8 pg (ref 26.0–34.0)
MCHC: 31.5 g/dL (ref 30.0–36.0)
MCV: 94.7 fL (ref 78.0–100.0)
Monocytes Absolute: 1.3 10*3/uL — ABNORMAL HIGH (ref 0.1–1.0)
Monocytes Relative: 11 %
NEUTROS ABS: 5.7 10*3/uL (ref 1.7–7.7)
NEUTROS PCT: 51 %
PLATELETS: 274 10*3/uL (ref 150–400)
RBC: 2.82 MIL/uL — AB (ref 3.87–5.11)
RDW: 14.7 % (ref 11.5–15.5)
WBC: 11.2 10*3/uL — AB (ref 4.0–10.5)

## 2018-01-14 LAB — ACID FAST SMEAR (AFB): ACID FAST SMEAR - AFSCU2: NEGATIVE

## 2018-01-14 LAB — ABO/RH: ABO/RH(D): A POS

## 2018-01-14 LAB — PREPARE RBC (CROSSMATCH)

## 2018-01-14 LAB — TROPONIN I: Troponin I: 0.03 ng/mL (ref <0.03)

## 2018-01-14 MED ORDER — FUROSEMIDE 10 MG/ML IJ SOLN
20.0000 mg | Freq: Once | INTRAMUSCULAR | Status: AC
  Administered 2018-01-14: 20 mg via INTRAVENOUS
  Filled 2018-01-14: qty 2

## 2018-01-14 MED ORDER — LACTATED RINGERS IV BOLUS
500.0000 mL | Freq: Once | INTRAVENOUS | Status: AC
  Administered 2018-01-14: 500 mL via INTRAVENOUS

## 2018-01-14 MED ORDER — DIPHENHYDRAMINE HCL 50 MG/ML IJ SOLN
25.0000 mg | Freq: Once | INTRAMUSCULAR | Status: AC
  Administered 2018-01-14: 25 mg via INTRAVENOUS
  Filled 2018-01-14: qty 1

## 2018-01-14 MED ORDER — RIFAMPIN 300 MG PO CAPS
300.0000 mg | ORAL_CAPSULE | Freq: Every day | ORAL | Status: DC
  Administered 2018-01-14 – 2018-01-17 (×4): 300 mg via ORAL
  Filled 2018-01-14 (×6): qty 1

## 2018-01-14 MED ORDER — ACETAMINOPHEN 325 MG PO TABS
650.0000 mg | ORAL_TABLET | Freq: Once | ORAL | Status: AC
  Administered 2018-01-14: 650 mg via ORAL

## 2018-01-14 MED ORDER — LORAZEPAM 0.5 MG PO TABS
0.5000 mg | ORAL_TABLET | Freq: Every day | ORAL | Status: DC | PRN
  Administered 2018-01-15 – 2018-01-18 (×4): 0.5 mg via ORAL
  Filled 2018-01-14 (×4): qty 1

## 2018-01-14 MED ORDER — SODIUM CHLORIDE 0.9 % IV BOLUS
500.0000 mL | Freq: Once | INTRAVENOUS | Status: AC
  Administered 2018-01-14: 500 mL via INTRAVENOUS

## 2018-01-14 MED ORDER — FUROSEMIDE 10 MG/ML IJ SOLN
20.0000 mg | Freq: Once | INTRAMUSCULAR | Status: DC
  Filled 2018-01-14: qty 2

## 2018-01-14 MED ORDER — NITROGLYCERIN 0.4 MG SL SUBL: 0.4000 mg | SUBLINGUAL_TABLET | SUBLINGUAL | Status: DC | PRN

## 2018-01-14 MED ORDER — VANCOMYCIN HCL 10 G IV SOLR
1250.0000 mg | INTRAVENOUS | Status: AC
  Administered 2018-01-14 – 2018-01-17 (×4): 1250 mg via INTRAVENOUS
  Filled 2018-01-14 (×5): qty 1250

## 2018-01-14 MED ORDER — OXYCODONE HCL ER 15 MG PO T12A
30.0000 mg | EXTENDED_RELEASE_TABLET | Freq: Two times a day (BID) | ORAL | Status: DC
  Administered 2018-01-14 – 2018-01-19 (×10): 30 mg via ORAL
  Filled 2018-01-14 (×10): qty 2

## 2018-01-14 NOTE — Consult Note (Signed)
       [facility]    Date of Admission:  01/13/2018   Total days of antibiotics: 0 vanco               Reason for Consult: Post-operative infection    Referring Provider: Nitka   Assessment: Staph aureus wound infection L4-5 fusion with screws, rods, cage, bone graft 01-13-18 Anemia, Hepatitis C Chronic narcotic use  Plan: 1. Continue vanco 2. Add rifampin 3. Await Cx 4. W/u her anemia 5. Check Hep C studies 6. Consider PIC line 7. Understand her placement needs.  8. Discussed with family.   Comment- Hx somewhat limited as she is lethargic post benadryl for transfusion.  Will f/u in AM.  Attempted to explain to family what we know so far- that this is staph, that she is not contagious, the etiology of her infection is unclear.   Thank you so much for this interesting consult,  Principal Problem:   Spondylolisthesis, lumbar region Active Problems:   Spinal stenosis of lumbar region   Fusion of lumbar spine   . aspirin EC  81 mg Oral Daily  . docusate sodium  100 mg Oral BID  . furosemide  20 mg Intravenous Once  . furosemide  20 mg Intravenous Once  . gabapentin  300 mg Oral TID  . metoprolol tartrate  12.5 mg Oral BID  . oxyCODONE  30 mg Oral Q12H  . pantoprazole  40 mg Oral Daily  . pravastatin  10 mg Oral q1800  . sodium chloride flush  3 mL Intravenous Q12H    HPI: [name] is a 62 y.o. female with hx of low back pain and chronic narcotic use. She was previously seen in hospital in [place] in Feb for UTI, back pain. Was told her images were negative.   She was adm to hospital earlier this month, had MRI showing marrow and soft tissue edema at L5-S1 with potential right sided nerve impingement. She underwent aspirate of this area on 12-27-17. This Cx was negative.  CRP 12.8, ESR 31.  She returned to hospital on 4-2 after noted to have synovial cysts at L5-S1. She underwent Transforaminal lumbar interbody fusion L4-5 with  screws, cages and rods, local bone graft, Allograft, Vivigen, Decompression L4-5 and L5-S1 (N/A).  She has been afebrile in hospital. Her WBC is slightly elevated at 11.2.  Her op Cx has now been found to have S aureus. sensi pending.      Review of Systems: Review of Systems  Unable to perform ROS: Mental acuity  Constitutional: Positive for fever.  Gastrointestinal: Positive for constipation. Negative for blood in stool.  Genitourinary: Negative for dysuria.  Musculoskeletal: Positive for back pain.  lethargic post benadryl for transfusion.  Please see HPI. All other systems reviewed and negative.   Past Medical History:  Diagnosis Date  . Acute hepatitis C without mention of hepatic coma(070.51)   . Anemia   . Arthritis    back  . Carpal tunnel syndrome   . Chest pain, unspecified   . Chronic airway obstruction, not elsewhere classified   . Colitis   . Coronary atherosclerosis of unspecified type of vessel, native or graft   . Disorders of bursae and tendons in shoulder region, unspecified   . GERD (gastroesophageal reflux disease)   . Headache    migraines  . Hepatitis    hx of hepatitis C  . Infective otitis externa, unspecified   . Mixed hyperlipidemia   .   Nonspecific abnormal results of thyroid function study   . Other bursitis disorders   . Other symptoms involving nervous and musculoskeletal systems(781.99)   . Pneumonia   . 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 Smoking status: Former Smoker    Types: Cigarettes    Last attempt to quit: 01/07/2018    Years since quitting: 0.0  . Smokeless tobacco: Never Used  . Tobacco comment: 2 cigarettes a day  Substance Use Topics  . Alcohol use: No  . Drug use: No    Family History  Problem Relation Age of Onset  . Crohn's disease Daughter 33       colon resection  . Irritable bowel syndrome Daughter   . Colonic polyp Mother   . Thyroid  disease Mother   . COPD Mother   . Uterine cancer Maternal Grandmother   . Heart murmur Daughter        SVT  . Colon cancer Neg Hx      Medications:  Scheduled: . aspirin EC  81 mg Oral Daily  . docusate sodium  100 mg Oral BID  . furosemide  20 mg Intravenous Once  . furosemide  20 mg Intravenous Once  . gabapentin  300 mg Oral TID  . metoprolol tartrate  12.5 mg Oral BID  . oxyCODONE  30 mg Oral Q12H  . pantoprazole  40 mg Oral Daily  . pravastatin  10 mg Oral q1800  . sodium chloride flush  3 mL Intravenous Q12H    Abtx:  Anti-infectives (From admission, onward)   Start     Dose/Rate Route Frequency Ordered Stop   01/14/18 1600  vancomycin (VANCOCIN) 1,250 mg in sodium chloride 0.9 % 250 mL IVPB     1,250 mg 166.7 mL/hr over 90 Minutes Intravenous Every 24 hours 01/14/18 1510     01/13/18 1630  ceFAZolin (ANCEF) IVPB 1 g/50 mL premix     1 g 100 mL/hr over 30 Minutes Intravenous Every 8 hours 01/13/18 1610 01/14/18 0559   01/13/18 0543  ceFAZolin (ANCEF) IVPB 2g/100 mL premix     2 g 200 mL/hr over 30 Minutes Intravenous On call to O.R. 01/13/18 0543 01/13/18 0745        OBJECTIVE: Blood pressure (!) 99/54, pulse 81, temperature 99.5 F (37.5 C), temperature source Oral, resp. rate 16, height 5' 1" (1.549 m), weight 60.3 kg (133 lb), SpO2 98 %.  Physical Exam  Constitutional: She is well-developed, well-nourished, and in no distress. No distress.  HENT:  Mouth/Throat: No oropharyngeal exudate.  Eyes: EOM are normal.  Neck: Neck supple.  Cardiovascular: Normal rate, regular rhythm and normal heart sounds.  Pulmonary/Chest: Effort normal and breath sounds normal.  Abdominal: Soft. Bowel sounds are normal. There is no tenderness. There is no rebound.  Musculoskeletal: She exhibits no edema.       Arms: Lymphadenopathy:    She has no cervical adenopathy.  Skin: She is not diaphoretic.    Lab Results Results for orders placed or performed during the hospital  encounter of 01/13/18 (from the past 48 hour(s))  Surgical pcr screen     Status: None   Collection Time: 01/13/18  5:42 AM  Result Value Ref Range   MRSA, PCR NEGATIVE NEGATIVE   Staphylococcus aureus NEGATIVE NEGATIVE    Comment: (NOTE) The Xpert SA Assay (FDA approved for NASAL specimens in patients 22 years   of age and older), is one component of a comprehensive surveillance program. It is not intended to diagnose infection nor to guide or monitor treatment. Performed at [facility], [address] [place], [place] [address]   Urinalysis, Routine w reflex microscopic     Status: Abnormal   Collection Time: 01/13/18  5:44 AM  Result Value Ref Range   Color, Urine YELLOW YELLOW   APPearance CLOUDY (A) CLEAR   Specific Gravity, Urine 1.027 1.005 - 1.030   pH 5.0 5.0 - 8.0   Glucose, UA NEGATIVE NEGATIVE mg/dL   Hgb urine dipstick MODERATE (A) NEGATIVE   Bilirubin Urine NEGATIVE NEGATIVE   Ketones, ur 5 (A) NEGATIVE mg/dL   Protein, ur NEGATIVE NEGATIVE mg/dL   Nitrite NEGATIVE NEGATIVE   Leukocytes, UA TRACE (A) NEGATIVE   RBC / HPF 6-30 0 - 5 RBC/hpf   WBC, UA 0-5 0 - 5 WBC/hpf   Bacteria, UA RARE (A) NONE SEEN   Squamous Epithelial / LPF TOO NUMEROUS TO COUNT (A) NONE SEEN   Mucus PRESENT     Comment: Performed at [facility], [address] [place], [place] [address]  CBC     Status: Abnormal   Collection Time: 01/13/18  7:01 AM  Result Value Ref Range   WBC 9.7 4.0 - 10.5 K/uL   RBC 4.56 3.87 - 5.11 MIL/uL   Hemoglobin 13.7 12.0 - 15.0 g/dL   HCT 42.2 36.0 - 46.0 %   MCV 92.5 78.0 - 100.0 fL   MCH 30.0 26.0 - 34.0 pg   MCHC 32.5 30.0 - 36.0 g/dL   RDW 14.3 11.5 - 15.5 %   Platelets 416 (H) 150 - 400 K/uL    Comment: Performed at [facility], [address] [place], [place] [address]  Comprehensive metabolic panel     Status: Abnormal   Collection Time: 01/13/18  7:01 AM  Result Value Ref Range   Sodium 137 135 - 145 mmol/L   Potassium 3.6  3.5 - 5.1 mmol/L   Chloride 103 101 - 111 mmol/L   CO2 21 (L) 22 - 32 mmol/L   Glucose, Bld 107 (H) 65 - 99 mg/dL   BUN 19 6 - 20 mg/dL   Creatinine, Ser 0.81 0.44 - 1.00 mg/dL   Calcium 8.8 (L) 8.9 - 10.3 mg/dL   Total Protein 6.6 6.5 - 8.1 g/dL   Albumin 3.2 (L) 3.5 - 5.0 g/dL   AST 19 15 - 41 U/L   ALT 14 14 - 54 U/L   Alkaline Phosphatase 88 38 - 126 U/L   Total Bilirubin 0.5 0.3 - 1.2 mg/dL   GFR calc non Af Amer >60 >60 mL/min   GFR calc Af Amer >60 >60 mL/min    Comment: (NOTE) The eGFR has been calculated using the CKD EPI equation. This calculation has not been validated in all clinical situations. eGFR's persistently <60 mL/min signify possible Chronic Kidney Disease.    Anion gap 13 5 - 15    Comment: Performed at [facility], [address] [place], [place] [address]  Protime-INR     Status: None   Collection Time: 01/13/18  7:01 AM  Result Value Ref Range   Prothrombin Time 14.1 11.4 - 15.2 seconds   INR 1.10     Comment: Performed at [facility], [address] [place], [place] [address]  APTT     Status: None   Collection   Time: 01/13/18  7:01 AM  Result Value Ref Range   aPTT 27 24 - 36 seconds    Comment: Performed at [facility] [address] [place], [place] [address]  Aerobic/Anaerobic Culture (surgical/deep wound)     Status: None (Preliminary result)   Collection Time: 01/13/18 10:28 AM  Result Value Ref Range   Specimen Description WOUND BACK CYSTIC AREA L5 S1 FACET    Special Requests SPEC A ON SWABS    Gram Stain      FEW WBC PRESENT,BOTH PMN AND MONONUCLEAR NO ORGANISMS SEEN    Culture      FEW STAPHYLOCOCCUS AUREUS SUSCEPTIBILITIES TO FOLLOW Performed at [facility], [place] [address], [place], [place] [address]    Report Status PENDING   Acid Fast Smear (AFB)     Status: None   Collection Time: 01/13/18 10:28 AM  Result Value Ref Range   AFB Specimen Processing Concentration    Acid Fast Smear Negative      Comment: (NOTE) Performed At: [facility] [place], [place] [id number] Rush Farmer MD JO:[id number]    Source (AFB) WOUND     Comment: BACK CYSTIC AREA L5 S1 FACET Performed at [facility], [place] [address], [place], [place] [address]   CBC     Status: Abnormal   Collection Time: 01/13/18 10:10 PM  Result Value Ref Range   WBC 11.7 (H) 4.0 - 10.5 K/uL   RBC 3.12 (L) 3.87 - 5.11 MIL/uL   Hemoglobin 9.5 (L) 12.0 - 15.0 g/dL    Comment: REPEATED TO VERIFY   HCT 29.2 (L) 36.0 - 46.0 %   MCV 93.6 78.0 - 100.0 fL   MCH 30.4 26.0 - 34.0 pg   MCHC 32.5 30.0 - 36.0 g/dL   RDW 14.7 11.5 - 15.5 %   Platelets 286 150 - 400 K/uL    Comment: Performed at [facility] [address] [place], [place] [address]  Basic Metabolic Panel     Status: Abnormal   Collection Time: 01/13/18 10:10 PM  Result Value Ref Range   Sodium 139 135 - 145 mmol/L   Potassium 3.6 3.5 - 5.1 mmol/L   Chloride 108 101 - 111 mmol/L   CO2 22 22 - 32 mmol/L   Glucose, Bld 190 (H) 65 - 99 mg/dL   BUN 8 6 - 20 mg/dL   Creatinine, Ser 0.81 0.44 - 1.00 mg/dL   Calcium 7.8 (L) 8.9 - 10.3 mg/dL   GFR calc non Af Amer >60 >60 mL/min   GFR calc Af Amer >60 >60 mL/min    Comment: (NOTE) The eGFR has been calculated using the CKD EPI equation. This calculation has not been validated in all clinical situations. eGFR's persistently <60 mL/min signify possible Chronic Kidney Disease.    Anion gap 9 5 - 15    Comment: Performed at [facility] [address] [place], [place] [address]  CBC with Differential/Platelet     Status: Abnormal   Collection Time: 01/14/18 12:04 PM  Result Value Ref Range   WBC 11.2 (H) 4.0 - 10.5 K/uL   RBC 2.82 (L) 3.87 - 5.11 MIL/uL   Hemoglobin 8.4 (L) 12.0 - 15.0 g/dL   HCT 26.7 (L) 36.0 - 46.0 %   MCV 94.7 78.0 - 100.0 fL   MCH 29.8 26.0 - 34.0 pg   MCHC 31.5 30.0 - 36.0 g/dL   RDW 14.7 11.5 -  15.5 %   Platelets 274 150 - 400 K/uL    Neutrophils Relative % 51 %   Neutro Abs 5.7 1.7 - 7.7 K/uL   Lymphocytes Relative 37 %   Lymphs Abs 4.1 (H) 0.7 - 4.0 K/uL   Monocytes Relative 11 %   Monocytes Absolute 1.3 (H) 0.1 - 1.0 K/uL   Eosinophils Relative 1 %   Eosinophils Absolute 0.1 0.0 - 0.7 K/uL   Basophils Relative 0 %   Basophils Absolute 0.0 0.0 - 0.1 K/uL    Comment: Performed at [facility] [address] [place], [place] [address]  Type and screen [place]     Status: None (Preliminary result)   Collection Time: 01/14/18  2:00 PM  Result Value Ref Range   ABO/RH(D) A POS    Antibody Screen NEG    Sample Expiration 01/17/2018    Unit Number [id number]    Blood Component Type RED CELLS,LR    Unit division 00    Status of Unit ALLOCATED    Transfusion Status OK TO TRANSFUSE    Crossmatch Result Compatible    Unit Number [id number]    Blood Component Type RED CELLS,LR    Unit division 00    Status of Unit ISSUED    Transfusion Status OK TO TRANSFUSE    Crossmatch Result      Compatible Performed at [facility], [place] [address] [place], [place] [address]   ABO/Rh     Status: None   Collection Time: 01/14/18  2:00 PM  Result Value Ref Range   ABO/RH(D)      A POS Performed at [facility] [address], [place], [place] [address]   Prepare RBC     Status: None   Collection Time: 01/14/18  2:06 PM  Result Value Ref Range   Order Confirmation      ORDER PROCESSED BY BLOOD BANK Performed at [facility], [place] [address] [address] [place], [place] [address]   Troponin I     Status: Abnormal   Collection Time: 01/14/18  2:35 PM  Result Value Ref Range   Troponin I 0.03 (HH) <0.03 ng/mL    Comment: CRITICAL RESULT CALLED TO, READ BACK BY AND VERIFIED WITHKarin Golden RN @ [phone] 01/14/18 LEONARD,A Performed at [facility], [place] [address], [place], [place] [address]       Component Value Date/Time   SDES WOUND BACK CYSTIC AREA L5 S1 FACET 01/13/2018  1028   SPECREQUEST SPEC A ON SWABS 01/13/2018 1028   CULT  01/13/2018 1028    FEW STAPHYLOCOCCUS AUREUS SUSCEPTIBILITIES TO FOLLOW Performed at [facility], [place] [address], [place], [place] [address]    REPTSTATUS PENDING 01/13/2018 1028   Dg Lumbar Spine Complete  Result Date: 01/13/2018 CLINICAL DATA:  Lumbar fusion at L4-5 EXAM: LUMBAR SPINE - COMPLETE 4+ VIEW; DG C-ARM 61-120 MIN COMPARISON:  12/25/2017 FLUOROSCOPY TIME:  Radiation Exposure Index (as provided by the fluoroscopic device): 32.74 mGy If the device does not provide the exposure index: Fluoroscopy Time:  77 seconds Number of Acquired Images:  4 FINDINGS: Initial frontal images demonstrate pedicle screws at L4 and L5 with interbody fusion new from the prior MRI examination. Surgical instrument is noted just to the right of the midline. The lateral images demonstrate similar pedicle screws with interbody fusion. IMPRESSION: L4-5 fusion. Electronically Signed   By: Inez Catalina M.D.   On: 01/13/2018 13:54  Dg Chest Port 1 View  Result Date: 01/14/2018 CLINICAL DATA:  Chest pain at rest. EXAM: PORTABLE CHEST 1 VIEW COMPARISON:  Two-view chest x-ray 12/28/2017 FINDINGS: The patient is significantly rotated to the right. Heart size is normal. There is no edema or effusion. No focal airspace disease is present. The visualized soft tissues and bony thorax are unremarkable. IMPRESSION: Negative one-view chest x-ray Electronically Signed   By: San Morelle M.D.   On: 01/14/2018 15:10   Dg C-arm 1-60 Min  Result Date: 01/13/2018 CLINICAL DATA:  Lumbar fusion at L4-5 EXAM: LUMBAR SPINE - COMPLETE 4+ VIEW; DG C-ARM 61-120 MIN COMPARISON:  12/25/2017 FLUOROSCOPY TIME:  Radiation Exposure Index (as provided by the fluoroscopic device): 32.74 mGy If the device does not provide the exposure index: Fluoroscopy Time:  77 seconds Number of Acquired Images:  4 FINDINGS: Initial frontal images demonstrate pedicle screws at L4 and L5 with  interbody fusion new from the prior MRI examination. Surgical instrument is noted just to the right of the midline. The lateral images demonstrate similar pedicle screws with interbody fusion. IMPRESSION: L4-5 fusion. Electronically Signed   By: Inez Catalina M.D.   On: 01/13/2018 13:54   Dg C-arm 1-60 Min  Result Date: 01/13/2018 CLINICAL DATA:  Lumbar fusion at L4-5 EXAM: LUMBAR SPINE - COMPLETE 4+ VIEW; DG C-ARM 61-120 MIN COMPARISON:  12/25/2017 FLUOROSCOPY TIME:  Radiation Exposure Index (as provided by the fluoroscopic device): 32.74 mGy If the device does not provide the exposure index: Fluoroscopy Time:  77 seconds Number of Acquired Images:  4 FINDINGS: Initial frontal images demonstrate pedicle screws at L4 and L5 with interbody fusion new from the prior MRI examination. Surgical instrument is noted just to the right of the midline. The lateral images demonstrate similar pedicle screws with interbody fusion. IMPRESSION: L4-5 fusion. Electronically Signed   By: Inez Catalina M.D.   On: 01/13/2018 13:54   Dg C-arm 1-60 Min  Result Date: 01/13/2018 CLINICAL DATA:  Lumbar fusion at L4-5 EXAM: LUMBAR SPINE - COMPLETE 4+ VIEW; DG C-ARM 61-120 MIN COMPARISON:  12/25/2017 FLUOROSCOPY TIME:  Radiation Exposure Index (as provided by the fluoroscopic device): 32.74 mGy If the device does not provide the exposure index: Fluoroscopy Time:  77 seconds Number of Acquired Images:  4 FINDINGS: Initial frontal images demonstrate pedicle screws at L4 and L5 with interbody fusion new from the prior MRI examination. Surgical instrument is noted just to the right of the midline. The lateral images demonstrate similar pedicle screws with interbody fusion. IMPRESSION: L4-5 fusion. Electronically Signed   By: Inez Catalina M.D.   On: 01/13/2018 13:54   Recent Results (from the past 240 hour(s))  Surgical pcr screen     Status: None   Collection Time: 01/13/18  5:42 AM  Result Value Ref Range Status   MRSA, PCR NEGATIVE  NEGATIVE Final   Staphylococcus aureus NEGATIVE NEGATIVE Final    Comment: (NOTE) The Xpert SA Assay (FDA approved for NASAL specimens in patients 49 years of age and older), is one component of a comprehensive surveillance program. It is not intended to diagnose infection nor to guide or monitor treatment. Performed at [facility], [place] [address], [place], [place] [address]   Aerobic/Anaerobic Culture (surgical/deep wound)     Status: None (Preliminary result)   Collection Time: 01/13/18 10:28 AM  Result Value Ref Range Status   Specimen Description WOUND BACK CYSTIC AREA L5 S1 FACET  Final   Special Requests SPEC A  ON SWABS  Final   Gram Stain   Final    FEW WBC PRESENT,BOTH PMN AND MONONUCLEAR NO ORGANISMS SEEN    Culture   Final    FEW STAPHYLOCOCCUS AUREUS SUSCEPTIBILITIES TO FOLLOW Performed at [facility], [address] [place], [place] [address]    Report Status PENDING  Incomplete  Acid Fast Smear (AFB)     Status: None   Collection Time: 01/13/18 10:28 AM  Result Value Ref Range Status   AFB Specimen Processing Concentration  Final   Acid Fast Smear Negative  Final    Comment: (NOTE) Performed At: [facility] [address] [place], [place] [id number] Nagendra Sanjai MD Ph:[phone]    Source (AFB) WOUND  Final    Comment: BACK CYSTIC AREA L5 S1 FACET Performed at [facility], [address] [place], [place] [address]     Microbiology: Recent Results (from the past 240 hour(s))  Surgical pcr screen     Status: None   Collection Time: 01/13/18  5:42 AM  Result Value Ref Range Status   MRSA, PCR NEGATIVE NEGATIVE Final   Staphylococcus aureus NEGATIVE NEGATIVE Final    Comment: (NOTE) The Xpert SA Assay (FDA approved for NASAL specimens in patients 22 years of age and older), is one component of a comprehensive surveillance program. It is not intended to diagnose infection nor to guide or monitor treatment. Performed at  [facility], [address] [place], [place] [address]   Aerobic/Anaerobic Culture (surgical/deep wound)     Status: None (Preliminary result)   Collection Time: 01/13/18 10:28 AM  Result Value Ref Range Status   Specimen Description WOUND BACK CYSTIC AREA L5 S1 FACET  Final   Special Requests SPEC A ON SWABS  Final   Gram Stain   Final    FEW WBC PRESENT,BOTH PMN AND MONONUCLEAR NO ORGANISMS SEEN    Culture   Final    FEW STAPHYLOCOCCUS AUREUS SUSCEPTIBILITIES TO FOLLOW Performed at [facility], [address] [place], [place] [address]    Report Status PENDING  Incomplete  Acid Fast Smear (AFB)     Status: None   Collection Time: 01/13/18 10:28 AM  Result Value Ref Range Status   AFB Specimen Processing Concentration  Final   Acid Fast Smear Negative  Final    Comment: (NOTE) Performed At: [facility] [address] [place], [place] [id number] Nagendra Sanjai MD Ph:[phone]    Source (AFB) WOUND  Final    Comment: BACK CYSTIC AREA L5 S1 FACET Performed at  [facility], [address] [place], [place] [address]     Radiographs and labs were personally reviewed by me.   Jeffrey Hatcher, MD FACP [facility] [phone] 01/14/2018, 4:47 PM  

## 2018-01-14 NOTE — Progress Notes (Signed)
OT Cancellation Note  Patient Details Name: [name] MRN: [id number] DOB: 07-09-1956   Cancelled Treatment:    Reason Eval/Treat Not Completed: Medical issues which prohibited therapy. Discussed with RN who reports pt hypotensive and with new chest pain. Note recent rapid response. RN requesting OT hold evaluation at this time. Will check back as able and appropriate.   Linda Sectionharity A Ella Golomb, MS OTR/L  Pager: [phone]   [name] 01/14/2018, 1:56 PM

## 2018-01-14 NOTE — Evaluation (Addendum)
Physical Therapy Evaluation Patient Details Name: [name] MRN: [id number] DOB: 1956-08-15 Today's Date: 01/15/2018   History of Present Illness  62yo female with history of LBP and radicular symptoms progressive in nature and who failed conservative treatments. She received a L4-L5 transforaminal interbody fusion on 01/13/18. PMH hepatitis C, OA, carpal tunnel syndrome, hx bursitis, hx seizures   Clinical Impression   Patient received in bed, pleasant and willing to participate in PT session this morning; daughter was present and assisted in providing PLOF and equipment history. Noted low BP, spoke to RN who reports patient has been stable around same general range and that PT is OK to try with close BP monitoring during session. BP 88/47 at rest in supine. Patient requires MinA for rolling and ModA x2 for safe sidelying to sit, however once sitting became disoriented and pale/sweaty with blank stare, unable to obtain BP as machine would not provide reading this low, and returned patient to supine position where she quickly recovered to baseline status. RN was present during this episode and is aware of patient mobility as well as patient status and medical episode this morning. BP increased to 104/54 in supine. Educated provided to patient and family regarding PT limitations today, plan moving forward. Patient left in bed with all needs met, daughter present.     Follow Up Recommendations Home health PT;Supervision/Assistance - 24 hour    Equipment Recommendations  Rolling walker with 5" wheels;3in1 (PT);Other (comment)    Recommendations for Other Services       Precautions / Restrictions Precautions Precautions: Back Precaution Comments: Pt able to recall 1/3 back precautions  Required Braces or Orthoses: Spinal Brace Spinal Brace: Lumbar corset;Applied in sitting position      Mobility  Bed Mobility Overal bed mobility: Needs Assistance Bed Mobility: Rolling;Sidelying to  Sit Rolling: Mod assist Sidelying to sit: Mod assist       General bed mobility comments: step by step instruction. Assist to roll and to push up on elbow   Transfers Overall transfer level: Needs assistance Equipment used: Rolling walker (2 wheeled) Transfers: Sit to/from [facility] Sit to Stand: Min assist Stand pivot transfers: Min assist       General transfer comment: min A to steady   Ambulation/Gait Ambulation/Gait assistance: Min guard;Min assist Ambulation Distance (Feet): 135 Feet Assistive device: Rolling walker (2 wheeled) Gait Pattern/deviations: Step-through pattern   Gait velocity interpretation: Below normal speed for age/gender General Gait Details: generally steady, but guarded overall  Stairs            Wheelchair Mobility    Modified Rankin (Stroke Patients Only)       Balance Overall balance assessment: Needs assistance Sitting-balance support: No upper extremity supported Sitting balance-Leahy Scale: Fair     Standing balance support: Bilateral upper extremity supported Standing balance-Leahy Scale: Poor Standing balance comment: reliance on the RW                             Pertinent Vitals/Pain Pain Assessment: Faces Faces Pain Scale: Hurts a little bit Pain Location: back  Pain Descriptors / Indicators: Operative site guarding Pain Intervention(s): Premedicated before session    Home Living Family/patient expects to be discharged to:: Private residence Living Arrangements: Parent;Children Available Help at Discharge: Family;Available 24 hours/day Type of Home: House Home Access: Stairs to enter Entrance Stairs-Rails: None Entrance Stairs-Number of Steps: 5 Home Layout: One level Home Equipment: Walker - 2  wheels;Bedside commode      Prior Function Level of Independence: Independent with assistive device(s)               Hand Dominance        Extremity/Trunk Assessment   Upper  Extremity Assessment Upper Extremity Assessment: Overall WFL for tasks assessed    Lower Extremity Assessment Lower Extremity Assessment: Defer to PT evaluation       Communication   Communication: No difficulties  Cognition Arousal/Alertness: Awake/alert Behavior During Therapy: WFL for tasks assessed/performed Overall Cognitive Status: Within Functional Limits for tasks assessed                                 General Comments: Pt mildly delayed likely due to pain meds       General Comments General comments (skin integrity, edema, etc.): Reinforced back prec, log roll, transition to sit, donning/doffing the brace, lifting restriction and progression of activity.    Exercises     Assessment/Plan    PT Assessment    PT Problem List         PT Treatment Interventions      PT Goals (Current goals can be found in the Care Plan section)  Acute Rehab PT Goals Patient Stated Goal: to get better and go home  PT Goal Formulation: With patient/family Time For Goal Achievement: 01/28/18 Potential to Achieve Goals: Fair    Frequency Min 5X/week   Barriers to discharge        Co-evaluation PT/OT/SLP Co-Evaluation/Treatment: Yes Reason for Co-Treatment: For patient/therapist safety PT goals addressed during session: Mobility/safety with mobility OT goals addressed during session: ADL's and self-care       AM-PAC PT "6 Clicks" Daily Activity  Outcome Measure Difficulty turning over in bed (including adjusting bedclothes, sheets and blankets)?: Unable Difficulty moving from lying on back to sitting on the side of the bed? : Unable Difficulty sitting down on and standing up from a chair with arms (e.g., wheelchair, bedside commode, etc,.)?: Unable Help needed moving to and from a bed to chair (including a wheelchair)?: A Little Help needed walking in hospital room?: A Little Help needed climbing 3-5 steps with a railing? : A Lot 6 Click Score: 11     End of Session   Activity Tolerance: Patient tolerated treatment well;Patient limited by fatigue Patient left: in chair;with call bell/phone within reach;with chair alarm set;with family/visitor present Nurse Communication: Mobility status PT Visit Diagnosis: Other abnormalities of gait and mobility (R26.89);Muscle weakness (generalized) (M62.81);Pain Pain - part of body: (back)    Time: 0388-8280 PT Time Calculation (min) (ACUTE ONLY): 26 min   Charges:     PT Treatments $Gait Training: 8-22 mins   PT G Codes:        Deniece Ree PT, DPT, CBIS  Supplemental Physical Therapist [place]   Pager [phone]

## 2018-01-14 NOTE — Progress Notes (Signed)
ANTIBIOTIC CONSULT NOTE - INITIAL  Pharmacy Consult for Vancomycin ndication: Staph Aureus in tissue culture  Allergies  Allergen Reactions  . Acetaminophen-Codeine Other (See Comments)    Hand get red, feels like it's on fire  . Morphine And Related     "makes me feel like i'm on fire"  . Azithromycin Itching and Rash    Patient Measurements: Height: 5\' 1"  (154.9 cm) Weight: 133 lb (60.3 kg) IBW/kg (Calculated) : 47.8 Adjusted Body Weight:    Vital Signs: Temp: 99.3 F (37.4 C) (04/03 0453) Temp Source: Oral (04/03 0453) BP: 101/52 (04/03 1257) Pulse Rate: 76 (04/03 1257) Intake/Output from previous day: 04/02 0701 - 04/03 0700 In: 3242.5 [I.V.:1792.5; Blood:200; IV Piggyback:1250] Out: 1525 [Urine:1025; Blood:500] Intake/Output from this shift: No intake/output data recorded.  Labs: Recent Labs    01/13/18 0701 01/13/18 2210 01/14/18 1204  WBC 9.7 11.7* 11.2*  HGB 13.7 9.5* 8.4*  PLT 416* 286 274  CREATININE 0.81 0.81  --    Estimated Creatinine Clearance: 60 mL/min (by C-G formula based on SCr of 0.81 mg/dL). No results for input(s): VANCOTROUGH, VANCOPEAK, VANCORANDOM, GENTTROUGH, GENTPEAK, GENTRANDOM, TOBRATROUGH, TOBRAPEAK, TOBRARND, AMIKACINPEAK, AMIKACINTROU, AMIKACIN in the last 72 hours.   Microbiology:   Medical History: Past Medical History:  Diagnosis Date  . Acute hepatitis C without mention of hepatic coma(070.51)   . Anemia   . Arthritis    back  . Carpal tunnel syndrome   . Chest pain, unspecified   . Chronic airway obstruction, not elsewhere classified   . Colitis   . Coronary atherosclerosis of unspecified type of vessel, native or graft   . Disorders of bursae and tendons in shoulder region, unspecified   . GERD (gastroesophageal reflux disease)   . Headache    migraines  . Hepatitis    hx of hepatitis C  . Infective otitis externa, unspecified   . Mixed hyperlipidemia   . Nonspecific abnormal results of thyroid function study    . Other bursitis disorders   . Other symptoms involving nervous and musculoskeletal systems(781.99)   . Pneumonia   . Seizures (HCC)    none for 4 years (as of 01/2018) "pseudo seizures"   Assessment: CC/HPI: Low back pain and LE radiculopathy  PMH: Hep C, anemia, arthritis, carpal tunnel, CP, chronic airway obstruction, colitis, CAD, shoulder tendon/bursae disorder, migraines, GERD, HLD, thyroid dysfunction, bursitis, seizures  Significant events: 4/2: Transforaminal lumbar interbody fusion L4-5, decompression L4-5 and L5-S1  ID: Staph aureus from tissue culture from spinal surgery. Tmax 99.3. WBC 11.2 Scr 0.81 with CrCl 60. 4/2: Back wound L5-S1 facet: SA 4/2: MRSA PCR negative  Goal of Therapy:  Vancomycin trough level 10-15 mcg/ml  Plan:  Vancomycin 1250mg  IV q 24h (goal 10-15) Trough after 3-5 doses at steady state.  [name], [name], [name] Clinical Staff Pharmacist Pager [phone]  [name], [name] 01/14/2018,3:20 PM

## 2018-01-14 NOTE — Progress Notes (Signed)
     Subjective: 1 Day Post-Op Procedure(s) (LRB): Transforaminal lumbar interbody fusion L4-5 with screws, cages and rods, local bone graft, Allograft, Vivigen, Decompression L4-5 and L5-S1 (N/A)Awake, obtunded but able to answer questions. Foley catheter in place. Appears sedated but still complaining of pain.   Patient reports pain as moderate.    Objective:   VITALS:  Temp:  [98 F (36.7 C)-99.3 F (37.4 C)] 99.3 F (37.4 C) (04/03 0453) Pulse Rate:  [50-83] 76 (04/03 1257) Resp:  [11-20] 20 (04/03 1242) BP: (80-128)/(43-68) 101/52 (04/03 1257) SpO2:  [96 %-100 %] 98 % (04/03 1257)  Neurologically intact ABD soft Neurovascular intact Sensation intact distally Intact pulses distally Dorsiflexion/Plantar flexion intact Incision: dressing C/D/I   LABS Recent Labs    01/13/18 0701 01/13/18 2210 01/14/18 1204  HGB 13.7 9.5* 8.4*  WBC 9.7 11.7* 11.2*  PLT 416* 286 274   Recent Labs    01/13/18 0701 01/13/18 2210  NA 137 139  K 3.6 3.6  CL 103 108  CO2 21* 22  BUN 19 8  CREATININE 0.81 0.81  GLUCOSE 107* 190*   Recent Labs    01/13/18 0701  INR 1.10     Assessment/Plan: 1 Day Post-Op Procedure(s) (LRB): Transforaminal lumbar interbody fusion L4-5 with screws, cages and rods, local bone graft, Allograft, Vivigen, Decompression L4-5 and L5-S1 (N/A)  Anemia due to blood loss. Pain control, chronic narcotic meds percocet for years 30 per month. Preop large quantities of narcotics to relieve pain now some tolerance as expected but if she is sedated should not give Narcotics.  Advance diet Up with therapy  Symptomatic with anemia 8.4 Hgb Transfuse 2 units of blood giving lasix to decrease fluid volumn.  Chest pain, likely non specific angina which she has, check troponin and EKG. SNF placement if no improvement in her therapy status.  Nursing [place] [address] are not familiar with lumbar fusion patients will [place] [address].    Vira BrownsJames  Nitka 01/14/2018, 2:19 PMPatient ID: [name], female   DOB: [birth year], 62 y.o.   MRN: [id number]

## 2018-01-14 NOTE — Significant Event (Signed)
Rapid Response Event Note  Overview: Time Called: 1243 Arrival Time: 1246 Event Type: Cardiac, Hypotension  Initial Focused Assessment: Called to bedside d/t patient c/o new onset chest pain 10/10 and hypotension. Upon arrival to room patient does not appear in distress, but c/o chest pain that is currently resolving. Patient assessed, breath sounds clear, heart sounds normal and heart beat regular. Patient describes chest pain as a sharp, squeezing type of pain that is now 4/10. Patient points to epigastric area as location of pain that radiates to left lower chest. Rechecked blood pressure 101/52, O2 sats 98% on room air, heart rate 76.  Interventions: Dr. Otelia SergeantNitka notified of new onset chest pain. 12 lead EKG done Order received for troponin level Mylanta given  Plan of Care (if not transferred): If chest pain returns and blood pressure able to tolerate, may use nitroglycerin SL prn. RN to call if further assistance needed.  Event Summary: Name of Physician Notified: Dr Otelia SergeantNitka at 1258    at    Outcome: Stayed in room and stabalized  Event End Time: 1306  [name] E Pervis Macintyre

## 2018-01-14 NOTE — Progress Notes (Signed)
PT Cancellation Note  Patient Details Name: [name] MRN: [id number] DOB: 03-21-1956   Cancelled Treatment:    Reason Eval/Treat Not Completed: Other (comment) Attempted to see patient, she was occupied with other hospital services and requests that PT return later in morning. Plan to attempt to return as/if schedule allows.    Nedra HaiKristen Yahye Siebert PT, DPT, CBIS  Supplemental Physical Therapist [facility]   Pager [phone]

## 2018-01-14 NOTE — Progress Notes (Addendum)
1100 Pt tried to sit up at the edge of the bed with PT. Poor balance while sitting, dizziness noted, pt stared blankly for few seconds and sweaty. Unable to get BP. Placed pt back to supine position. BP 104/47 P 74.  Dr Otelia SergeantNitka notified, IVF bolus given.  1240 Pt c/o left chest pain "7"/10, crying and hyperventilating. Tried to calm pt, taking slow deep breaths. Chest pain decreasing. EKG done, RRT, Mel came in and assessed pt.  Dr Otelia SergeantNitka notified. Chest pain went down to "1"/10.  1445 Trop 0.03, Dr Otelia SergeantNitka notified. Will transfer pt to 4N. Waiting for bed availlabilty. 1642 Blood transfusion started.  1850 Transferred pt to 4N11 via bed. [facility]. Family present.

## 2018-01-14 NOTE — Consult Note (Signed)
Referring Physician:  TORIA MONTE is an 62 y.o. female.                       Chief Complaint: Chest pain  HPI: 62 year old female with PMH of CAD, tobacco use disorder, Hepatitis C, Chronic anxiety and recent lumbar fusion surgery had chest pain with minimally elevated troponin I.  Past Medical History:  Diagnosis Date  . Acute hepatitis C without mention of hepatic coma(070.51)   . Anemia   . Arthritis    back  . Carpal tunnel syndrome   . Chest pain, unspecified   . Chronic airway obstruction, not elsewhere classified   . Colitis   . Coronary atherosclerosis of unspecified type of vessel, native or graft   . Disorders of bursae and tendons in shoulder region, unspecified   . GERD (gastroesophageal reflux disease)   . Headache    migraines  . Hepatitis    hx of hepatitis C  . Infective otitis externa, unspecified   . Mixed hyperlipidemia   . Nonspecific abnormal results of thyroid function study   . Other bursitis disorders   . Other symptoms involving nervous and musculoskeletal systems(781.99)   . Pneumonia   . Seizures ([place])    none for 4 years (as of 01/2018) "pseudo seizures"      Past Surgical History:  Procedure Laterality Date  . ABDOMINAL HYSTERECTOMY    . CARDIAC CATHETERIZATION    . CARDIAC CATHETERIZATION N/A 06/25/2016   Procedure: Left Heart Cath and Coronary Angiography;  Surgeon: Dixie Dials, MD;  Location: [facility];  Service: Cardiovascular;  Laterality: N/A;  . CESAREAN SECTION    . CHOLECYSTECTOMY    . COLONOSCOPY    . IR FLUORO GUIDED NEEDLE PLC ASPIRATION/INJECTION LOC  12/27/2017  . TONSILLECTOMY      Family History  Problem Relation Age of Onset  . Crohn's disease Daughter 19       colon resection  . Irritable bowel syndrome Daughter   . Colonic polyp Mother   . Thyroid disease Mother   . COPD Mother   . Uterine cancer Maternal Grandmother   . Heart murmur Daughter        SVT  . Colon cancer Neg Hx    Social History:   reports that she quit smoking 7 days ago. Her smoking use included cigarettes. She has never used smokeless tobacco. She reports that she does not drink alcohol or use drugs.  Allergies:  Allergies  Allergen Reactions  . Acetaminophen-Codeine Other (See Comments)    Hand get red, feels like it's on fire  . Morphine And Related     "makes me feel like i'm on fire"  . Azithromycin Itching and Rash    Medications Prior to Admission  Medication Sig Dispense Refill  . esomeprazole (NEXIUM) 40 MG capsule Take 40 mg by mouth 2 (two) times daily as needed (heart burn).     . LORazepam (ATIVAN) 1 MG tablet Take 1 mg by mouth daily as needed for anxiety.    Marland Kitchen oxyCODONE (OXY IR/ROXICODONE) 5 MG immediate release tablet Take 1 tablet (5 mg total) by mouth every 4 (four) hours as needed for up to 2 days for moderate pain. This patient has acute inflamatory vs infection changes in her lumbar spine and has pain in excess of what she has Require pain medications in the past. She is undergoing evaluation o 12 tablet 0  . oxyCODONE (OXYCONTIN) 20  mg 12 hr tablet Take 1 tablet (20 mg total) by mouth every 12 (twelve) hours. Current oxycodone IR is not controlling this patients pain, she has nerve compression in the lumbar spine and is being scheduled for a lumbar surgery. 14 tablet 0  . aspirin EC 81 MG EC tablet Take 1 tablet (81 mg total) by mouth daily. (Patient not taking: Reported on 01/13/2018) 30 tablet 1  . methocarbamol (ROBAXIN) 500 MG tablet Take 1 tablet (500 mg total) by mouth 4 (four) times daily. (Patient not taking: Reported on 01/13/2018) 30 tablet 0  . metoprolol tartrate (LOPRESSOR) 25 MG tablet Take 0.5 tablets (12.5 mg total) by mouth 2 (two) times daily. (Patient not taking: Reported on 01/13/2018) 30 tablet 1  . nitroGLYCERIN (NITROSTAT) 0.4 MG SL tablet Place 1 tablet (0.4 mg total) under the tongue every 5 (five) minutes x 3 doses as needed for chest pain. (Patient not taking: Reported on  01/13/2018) 25 tablet 1  . pravastatin (PRAVACHOL) 10 MG tablet Take 1 tablet (10 mg total) by mouth daily at 6 PM. (Patient not taking: Reported on 01/13/2018) 30 tablet 1  . predniSONE (STERAPRED UNI-PAK 21 TAB) 10 MG (21) TBPK tablet Take as directed (Patient not taking: Reported on 01/13/2018) 21 tablet 0    Results for orders placed or performed during the hospital encounter of 01/13/18 (from the past 48 hour(s))  Surgical pcr screen     Status: None   Collection Time: 01/13/18  5:42 AM  Result Value Ref Range   MRSA, PCR NEGATIVE NEGATIVE   Staphylococcus aureus NEGATIVE NEGATIVE    Comment: (NOTE) The Xpert SA Assay (FDA approved for NASAL specimens in patients 25 years of age and older), is one component of a comprehensive surveillance program. It is not intended to diagnose infection nor to guide or monitor treatment. Performed at [facility], [place] [address] [place], [place] [address]   Urinalysis, Routine w reflex microscopic     Status: Abnormal   Collection Time: 01/13/18  5:44 AM  Result Value Ref Range   Color, Urine YELLOW YELLOW   APPearance CLOUDY (A) CLEAR   Specific Gravity, Urine 1.027 1.005 - 1.030   pH 5.0 5.0 - 8.0   Glucose, UA NEGATIVE NEGATIVE mg/dL   Hgb urine dipstick MODERATE (A) NEGATIVE   Bilirubin Urine NEGATIVE NEGATIVE   Ketones, ur 5 (A) NEGATIVE mg/dL   Protein, ur NEGATIVE NEGATIVE mg/dL   Nitrite NEGATIVE NEGATIVE   Leukocytes, UA TRACE (A) NEGATIVE   RBC / HPF 6-30 0 - 5 RBC/hpf   WBC, UA 0-5 0 - 5 WBC/hpf   Bacteria, UA RARE (A) NONE SEEN   Squamous Epithelial / LPF TOO NUMEROUS TO COUNT (A) NONE SEEN   Mucus PRESENT     Comment: Performed at [facility], [place] [address], [place],  [address]  CBC     Status: Abnormal   Collection Time: 01/13/18  7:01 AM  Result Value Ref Range   WBC 9.7 4.0 - 10.5 K/uL   RBC 4.56 3.87 - 5.11 MIL/uL   Hemoglobin 13.7 12.0 - 15.0 g/dL   HCT 42.2 36.0 - 46.0 %   MCV 92.5 78.0 - 100.0 fL    MCH 30.0 26.0 - 34.0 pg   MCHC 32.5 30.0 - 36.0 g/dL   RDW 14.3 11.5 - 15.5 %   Platelets 416 (H) 150 - 400 K/uL    Comment: Performed at [facility], [place]  [address] [place], [place] [address]  Comprehensive metabolic panel     Status: Abnormal   Collection Time: 01/13/18  7:01 AM  Result Value Ref Range   Sodium 137 135 - 145 mmol/L   Potassium 3.6 3.5 - 5.1 mmol/L   Chloride 103 101 - 111 mmol/L   CO2 21 (L) 22 - 32 mmol/L   Glucose, Bld 107 (H) 65 - 99 mg/dL   BUN 19 6 - 20 mg/dL   Creatinine, Ser 0.81 0.44 - 1.00 mg/dL   Calcium 8.8 (L) 8.9 - 10.3 mg/dL   Total Protein 6.6 6.5 - 8.1 g/dL   Albumin 3.2 (L) 3.5 - 5.0 g/dL   AST 19 15 - 41 U/L   ALT 14 14 - 54 U/L   Alkaline Phosphatase 88 38 - 126 U/L   Total Bilirubin 0.5 0.3 - 1.2 mg/dL   GFR calc non Af Amer >60 >60 mL/min   GFR calc Af Amer >60 >60 mL/min    Comment: (NOTE) The eGFR has been calculated using the CKD EPI equation. This calculation has not been validated in all clinical situations. eGFR's persistently <60 mL/min signify possible Chronic Kidney Disease.    Anion gap 13 5 - 15    Comment: Performed at [facility] [address], [place], [place] [address]  Protime-INR     Status: None   Collection Time: 01/13/18  7:01 AM  Result Value Ref Range   Prothrombin Time 14.1 11.4 - 15.2 seconds   INR 1.10     Comment: Performed at [facility] [address] [place], [place] [address]  APTT     Status: None   Collection Time: 01/13/18  7:01 AM  Result Value Ref Range   aPTT 27 24 - 36 seconds    Comment: Performed at [facility] [address], [place], [place] [address]  Aerobic/Anaerobic Culture (surgical/deep wound)     Status: None (Preliminary result)   Collection Time: 01/13/18 10:28 AM  Result Value Ref Range   Specimen Description WOUND BACK CYSTIC AREA L5 S1 FACET    Special Requests SPEC A ON SWABS    Gram Stain      FEW WBC PRESENT,BOTH PMN AND MONONUCLEAR NO  ORGANISMS SEEN    Culture      FEW STAPHYLOCOCCUS AUREUS SUSCEPTIBILITIES TO FOLLOW Performed at [facility], [place] [address], [place], [place] [address]    Report Status PENDING   Acid Fast Smear (AFB)     Status: None   Collection Time: 01/13/18 10:28 AM  Result Value Ref Range   AFB Specimen Processing Concentration    Acid Fast Smear Negative     Comment: (NOTE) Performed At: [facility] [place], [place] [id number] Rush Farmer MD CL:[id number]    Source (AFB) WOUND     Comment: BACK CYSTIC AREA L5 S1 FACET Performed at [facility], [place] [address] [place],  [address]   CBC     Status: Abnormal   Collection Time: 01/13/18 10:10 PM  Result Value Ref Range   WBC 11.7 (H) 4.0 - 10.5 K/uL   RBC 3.12 (L) 3.87 - 5.11 MIL/uL   Hemoglobin 9.5 (L) 12.0 - 15.0 g/dL    Comment: REPEATED TO VERIFY   HCT 29.2 (L) 36.0 - 46.0 %   MCV 93.6 78.0 - 100.0 fL   MCH 30.4 26.0 - 34.0 pg   MCHC 32.5 30.0 - 36.0  g/dL   RDW 14.7 11.5 - 15.5 %   Platelets 286 150 - 400 K/uL    Comment: Performed at [facility], [place] [address], [place], [place] [address]  Basic Metabolic Panel     Status: Abnormal   Collection Time: 01/13/18 10:10 PM  Result Value Ref Range   Sodium 139 135 - 145 mmol/L   Potassium 3.6 3.5 - 5.1 mmol/L   Chloride 108 101 - 111 mmol/L   CO2 22 22 - 32 mmol/L   Glucose, Bld 190 (H) 65 - 99 mg/dL   BUN 8 6 - 20 mg/dL   Creatinine, Ser 0.81 0.44 - 1.00 mg/dL   Calcium 7.8 (L) 8.9 - 10.3 mg/dL   GFR calc non Af Amer >60 >60 mL/min   GFR calc Af Amer >60 >60 mL/min    Comment: (NOTE) The eGFR has been calculated using the CKD EPI equation. This calculation has not been validated in all clinical situations. eGFR's persistently <60 mL/min signify possible Chronic Kidney Disease.    Anion gap 9 5 - 15    Comment: Performed at [facility] [address] [place], [place] [address]  CBC with Differential/Platelet      Status: Abnormal   Collection Time: 01/14/18 12:04 PM  Result Value Ref Range   WBC 11.2 (H) 4.0 - 10.5 K/uL   RBC 2.82 (L) 3.87 - 5.11 MIL/uL   Hemoglobin 8.4 (L) 12.0 - 15.0 g/dL   HCT 26.7 (L) 36.0 - 46.0 %   MCV 94.7 78.0 - 100.0 fL   MCH 29.8 26.0 - 34.0 pg   MCHC 31.5 30.0 - 36.0 g/dL   RDW 14.7 11.5 - 15.5 %   Platelets 274 150 - 400 K/uL   Neutrophils Relative % 51 %   Neutro Abs 5.7 1.7 - 7.7 K/uL   Lymphocytes Relative 37 %   Lymphs Abs 4.1 (H) 0.7 - 4.0 K/uL   Monocytes Relative 11 %   Monocytes Absolute 1.3 (H) 0.1 - 1.0 K/uL   Eosinophils Relative 1 %   Eosinophils Absolute 0.1 0.0 - 0.7 K/uL   Basophils Relative 0 %   Basophils Absolute 0.0 0.0 - 0.1 K/uL    Comment: Performed at [facility], [address] [place], [place] [address]  Type and screen [place]     Status: None (Preliminary result)   Collection Time: 01/14/18  2:00 PM  Result Value Ref Range   ABO/RH(D) A POS    Antibody Screen NEG    Sample Expiration 01/17/2018    Unit Number [id number]    Blood Component Type RED CELLS,LR    Unit division 00    Status of Unit ALLOCATED    Transfusion Status OK TO TRANSFUSE    Crossmatch Result Compatible    Unit Number [id number]    Blood Component Type RED CELLS,LR    Unit division 00    Status of Unit ISSUED    Transfusion Status OK TO TRANSFUSE    Crossmatch Result      Compatible Performed at [facility], [place] [address], [place], [place] [address]   ABO/Rh     Status: None   Collection Time: 01/14/18  2:00 PM  Result Value Ref Range   ABO/RH(D)      A POS Performed at [facility] [address] [place], [place] [address]   Prepare RBC     Status: None  Collection Time: 01/14/18  2:06 PM  Result Value Ref Range   Order Confirmation      ORDER PROCESSED BY BLOOD BANK Performed at [facility], [place] [address], [place], [place] [address]   Troponin I     Status: Abnormal   Collection  Time: 01/14/18  2:35 PM  Result Value Ref Range   Troponin I 0.03 (HH) <0.03 ng/mL    Comment: CRITICAL RESULT CALLED TO, READ BACK BY AND VERIFIED WITHKarin Golden RN @ [phone] 01/14/18 LEONARD,A Performed at [facility], [place] [address], [place], [place] [address]    Dg Lumbar Spine Complete  Result Date: 01/13/2018 CLINICAL DATA:  Lumbar fusion at L4-5 EXAM: LUMBAR SPINE - COMPLETE 4+ VIEW; DG C-ARM 61-120 MIN COMPARISON:  12/25/2017 FLUOROSCOPY TIME:  Radiation Exposure Index (as provided by the fluoroscopic device): 32.74 mGy If the device does not provide the exposure index: Fluoroscopy Time:  77 seconds Number of Acquired Images:  4 FINDINGS: Initial frontal images demonstrate pedicle screws at L4 and L5 with interbody fusion new from the prior MRI examination. Surgical instrument is noted just to the right of the midline. The lateral images demonstrate similar pedicle screws with interbody fusion. IMPRESSION: L4-5 fusion. Electronically Signed   By: Inez Catalina M.D.   On: 01/13/2018 13:54   Dg Chest Port 1 View  Result Date: 01/14/2018 CLINICAL DATA:  Chest pain at rest. EXAM: PORTABLE CHEST 1 VIEW COMPARISON:  Two-view chest x-ray 12/28/2017 FINDINGS: The patient is significantly rotated to the right. Heart size is normal. There is no edema or effusion. No focal airspace disease is present. The visualized soft tissues and bony thorax are unremarkable. IMPRESSION: Negative one-view chest x-ray Electronically Signed   By: San Morelle M.D.   On: 01/14/2018 15:10   Dg C-arm 1-60 Min  Result Date: 01/13/2018 CLINICAL DATA:  Lumbar fusion at L4-5 EXAM: LUMBAR SPINE - COMPLETE 4+ VIEW; DG C-ARM 61-120 MIN COMPARISON:  12/25/2017 FLUOROSCOPY TIME:  Radiation Exposure Index (as provided by the fluoroscopic device): 32.74 mGy If the device does not provide the exposure index: Fluoroscopy Time:  77 seconds Number of Acquired Images:  4 FINDINGS: Initial frontal images demonstrate pedicle  screws at L4 and L5 with interbody fusion new from the prior MRI examination. Surgical instrument is noted just to the right of the midline. The lateral images demonstrate similar pedicle screws with interbody fusion. IMPRESSION: L4-5 fusion. Electronically Signed   By: Inez Catalina M.D.   On: 01/13/2018 13:54   Dg C-arm 1-60 Min  Result Date: 01/13/2018 CLINICAL DATA:  Lumbar fusion at L4-5 EXAM: LUMBAR SPINE - COMPLETE 4+ VIEW; DG C-ARM 61-120 MIN COMPARISON:  12/25/2017 FLUOROSCOPY TIME:  Radiation Exposure Index (as provided by the fluoroscopic device): 32.74 mGy If the device does not provide the exposure index: Fluoroscopy Time:  77 seconds Number of Acquired Images:  4 FINDINGS: Initial frontal images demonstrate pedicle screws at L4 and L5 with interbody fusion new from the prior MRI examination. Surgical instrument is noted just to the right of the midline. The lateral images demonstrate similar pedicle screws with interbody fusion. IMPRESSION: L4-5 fusion. Electronically Signed   By: Inez Catalina M.D.   On: 01/13/2018 13:54   Dg C-arm 1-60 Min  Result Date: 01/13/2018 CLINICAL DATA:  Lumbar fusion at L4-5 EXAM: LUMBAR SPINE - COMPLETE 4+ VIEW; DG C-ARM 61-120 MIN COMPARISON:  12/25/2017 FLUOROSCOPY TIME:  Radiation Exposure Index (as provided by the fluoroscopic device): 32.74  mGy If the device does not provide the exposure index: Fluoroscopy Time:  77 seconds Number of Acquired Images:  4 FINDINGS: Initial frontal images demonstrate pedicle screws at L4 and L5 with interbody fusion new from the prior MRI examination. Surgical instrument is noted just to the right of the midline. The lateral images demonstrate similar pedicle screws with interbody fusion. IMPRESSION: L4-5 fusion. Electronically Signed   By: Inez Catalina M.D.   On: 01/13/2018 13:54    Review Of Systems Constitutional: No fever, chills, weight loss or gain. Eyes: No vision change, wears glasses. No discharge or pain. Ears: No  hearing loss, No tinnitus. Respiratory: No asthma, positive COPD, pneumonias. Positive shortness of breath. No hemoptysis. Cardiovascular: Positive chest pain, palpitation, leg edema. Gastrointestinal: Positive nausea, vomiting, diarrhea, constipation. No GI bleed. No hepatitis. Genitourinary: No dysuria, hematuria, kidney stone. No incontinance. Neurological: No headache, stroke, seizures.  Psychiatry: No psych facility admission for anxiety, depression, suicide. No detox. Skin: No rash. Musculoskeletal: Positive joint pain, fibromyalgia, neck pain, back pain. Lymphadenopathy: No lymphadenopathy. Hematology: No anemia or easy bruising.   Blood pressure 108/62, pulse 70, temperature 99.3 F (37.4 C), temperature source Axillary, resp. rate 16, height 5' 1" (1.549 m), weight 60.3 kg (133 lb), SpO2 98 %. Body mass index is 25.13 kg/m. General appearance: alert, cooperative, appears stated age and no distress Head: Normocephalic, atraumatic. Eyes: Blue eyes, pale pink conjunctiva, corneas clear. PERRL, EOM's intact. Neck: No adenopathy, no carotid bruit, no JVD, supple, symmetrical, trachea midline and thyroid not enlarged. Resp: Clear to auscultation bilaterally. Cardio: Regular rate and rhythm, S1, S2 normal, II/VI systolic murmur, no click, rub or gallop GI: Soft, non-tender; bowel sounds normal; no organomegaly. Extremities: No edema, cyanosis or clubbing. Small Dressing over lower lumbar vertebrae. Skin: Warm and dry.  Neurologic: Alert and oriented X 3, normal strength.  Assessment/Plan Chest pain R/O MI Recent Lumbar spine surgery CAD Tobacco use disorder H/O Hepatitis C. Acute blood loss anemia  Repeat troponin-I. Suspect demand ischemia for minimally elevated troponin I. Continue RBC transfusion IV antibiotics per infectious disease.Birdie Riddle, MD  01/14/2018, 7:19 PM

## 2018-01-14 NOTE — Progress Notes (Addendum)
Patient arrived to unit, family at bedside. Oriented to room/unit. Vitals stable. Unit of blood currently infusing. HS RN at bedside as well. Continue to monitor patient.

## 2018-01-14 NOTE — Progress Notes (Signed)
Patient blood pressure 86/54 and 87/54. No complaints besides surgical pain.Linda KirschnerLindsey Stanberry PA notified with new order. Will monitor.

## 2018-01-15 ENCOUNTER — Other Ambulatory Visit: Payer: Self-pay

## 2018-01-15 ENCOUNTER — Encounter (HOSPITAL_COMMUNITY): Payer: Self-pay | Admitting: General Practice

## 2018-01-15 DIAGNOSIS — Z9889 Other specified postprocedural states: Secondary | ICD-10-CM

## 2018-01-15 DIAGNOSIS — R509 Fever, unspecified: Secondary | ICD-10-CM

## 2018-01-15 DIAGNOSIS — A4901 Methicillin susceptible Staphylococcus aureus infection, unspecified site: Secondary | ICD-10-CM

## 2018-01-15 LAB — TYPE AND SCREEN
ABO/RH(D): A POS
ANTIBODY SCREEN: NEGATIVE
UNIT DIVISION: 0
UNIT DIVISION: 0

## 2018-01-15 LAB — BPAM RBC
BLOOD PRODUCT EXPIRATION DATE: 201904262359
Blood Product Expiration Date: 201904262359
ISSUE DATE / TIME: 201904031633
ISSUE DATE / TIME: 201904032043
Unit Type and Rh: 6200
Unit Type and Rh: 6200

## 2018-01-15 LAB — COMPREHENSIVE METABOLIC PANEL
ALT: 9 U/L — ABNORMAL LOW (ref 14–54)
ANION GAP: 7 (ref 5–15)
AST: 19 U/L (ref 15–41)
Albumin: 2.4 g/dL — ABNORMAL LOW (ref 3.5–5.0)
Alkaline Phosphatase: 56 U/L (ref 38–126)
BUN: <5 mg/dL — ABNORMAL LOW (ref 6–20)
CALCIUM: 7.7 mg/dL — AB (ref 8.9–10.3)
CHLORIDE: 105 mmol/L (ref 101–111)
CO2: 28 mmol/L (ref 22–32)
Creatinine, Ser: 0.77 mg/dL (ref 0.44–1.00)
Glucose, Bld: 123 mg/dL — ABNORMAL HIGH (ref 65–99)
Potassium: 3.8 mmol/L (ref 3.5–5.1)
SODIUM: 140 mmol/L (ref 135–145)
Total Bilirubin: 0.9 mg/dL (ref 0.3–1.2)
Total Protein: 4.6 g/dL — ABNORMAL LOW (ref 6.5–8.1)

## 2018-01-15 LAB — TROPONIN I: Troponin I: 0.03 ng/mL (ref <0.03)

## 2018-01-15 LAB — CBC WITH DIFFERENTIAL/PLATELET
Basophils Absolute: 0 10*3/uL (ref 0.0–0.1)
Basophils Relative: 0 %
Eosinophils Absolute: 0.4 10*3/uL (ref 0.0–0.7)
Eosinophils Relative: 3 %
HEMATOCRIT: 35 % — AB (ref 36.0–46.0)
HEMOGLOBIN: 11 g/dL — AB (ref 12.0–15.0)
LYMPHS ABS: 3.4 10*3/uL (ref 0.7–4.0)
Lymphocytes Relative: 34 %
MCH: 29.3 pg (ref 26.0–34.0)
MCHC: 31.4 g/dL (ref 30.0–36.0)
MCV: 93.3 fL (ref 78.0–100.0)
MONOS PCT: 13 %
Monocytes Absolute: 1.3 10*3/uL — ABNORMAL HIGH (ref 0.1–1.0)
NEUTROS PCT: 50 %
Neutro Abs: 5.1 10*3/uL (ref 1.7–7.7)
Platelets: 257 10*3/uL (ref 150–400)
RBC: 3.75 MIL/uL — ABNORMAL LOW (ref 3.87–5.11)
RDW: 15.5 % (ref 11.5–15.5)
WBC: 10.2 10*3/uL (ref 4.0–10.5)

## 2018-01-15 LAB — URINALYSIS, ROUTINE W REFLEX MICROSCOPIC
BILIRUBIN URINE: NEGATIVE
Glucose, UA: NEGATIVE mg/dL
Ketones, ur: NEGATIVE mg/dL
LEUKOCYTES UA: NEGATIVE
NITRITE: NEGATIVE
PH: 7 (ref 5.0–8.0)
Protein, ur: NEGATIVE mg/dL
SPECIFIC GRAVITY, URINE: 1.008 (ref 1.005–1.030)
Squamous Epithelial / LPF: NONE SEEN

## 2018-01-15 LAB — CBC
HCT: 34.4 % — ABNORMAL LOW (ref 36.0–46.0)
Hemoglobin: 11.2 g/dL — ABNORMAL LOW (ref 12.0–15.0)
MCH: 30.6 pg (ref 26.0–34.0)
MCHC: 32.6 g/dL (ref 30.0–36.0)
MCV: 94 fL (ref 78.0–100.0)
Platelets: 249 10*3/uL (ref 150–400)
RBC: 3.66 MIL/uL — ABNORMAL LOW (ref 3.87–5.11)
RDW: 16 % — ABNORMAL HIGH (ref 11.5–15.5)
WBC: 10 10*3/uL (ref 4.0–10.5)

## 2018-01-15 MED ORDER — IBUPROFEN 200 MG PO TABS
400.0000 mg | ORAL_TABLET | Freq: Four times a day (QID) | ORAL | Status: DC | PRN
  Administered 2018-01-15 – 2018-01-17 (×4): 400 mg via ORAL
  Filled 2018-01-15 (×4): qty 2

## 2018-01-15 MED ORDER — POLYETHYLENE GLYCOL 3350 17 G PO PACK
17.0000 g | PACK | Freq: Every day | ORAL | Status: DC
  Administered 2018-01-15 – 2018-01-18 (×5): 17 g via ORAL
  Filled 2018-01-15 (×5): qty 1

## 2018-01-15 MED ORDER — ENSURE ENLIVE PO LIQD: 237.0000 mL | Freq: Two times a day (BID) | ORAL | Status: DC

## 2018-01-15 NOTE — Consult Note (Signed)
Ref: Orpah Cobb, MD   Subjective:  Febrile. Increased back pain. Chest pain resolving. Troponin I remains minimally elevated only. Hgb improving with transfusion.   Objective:  Vital Signs in the last 24 hours: Temp:  [99.2 F (37.3 C)-101.8 F (38.8 C)] 100.2 F (37.9 C) (04/04 1937) Pulse Rate:  [67-88] 69 (04/04 1937) Cardiac Rhythm: Normal sinus rhythm (04/04 2009) Resp:  [15-22] 16 (04/04 1937) BP: (89-129)/(48-89) 106/60 (04/04 1937) SpO2:  [93 %-96 %] 94 % (04/04 1937)  Physical Exam: BP Readings from Last 1 Encounters:  01/15/18 106/60     Wt Readings from Last 1 Encounters:  01/13/18 60.3 kg (133 lb)    Weight change:  Body mass index is 25.13 kg/m. HEENT: [place]/AT, Eyes-Blue, PERL, EOMI, Conjunctiva-Pink, Sclera-Non-icteric Neck: No JVD, No bruit, Trachea midline. Lungs:  Clear, Bilateral. Cardiac:  Regular rhythm, normal S1 and S2, no S3. II/VI systolic murmur. Abdomen:  Soft, non-tender. BS present. Larger dressing over lumbar spine than yesterday. Extremities:  No edema present. No cyanosis. No clubbing. CNS: AxOx3, Cranial nerves grossly intact, moves all 4 extremities.  Skin: Warm and dry.   Intake/Output from previous day: 04/03 0701 - 04/04 0700 In: 1851.4 [P.O.:360; I.V.:602.1; Blood:639.3; IV Piggyback:250] Out: 3658 [Urine:3658]    Lab Results: BMET    Component Value Date/Time   NA 140 01/15/2018 0503   NA 139 01/13/2018 2210   NA 137 01/13/2018 0701   K 3.8 01/15/2018 0503   K 3.6 01/13/2018 2210   K 3.6 01/13/2018 0701   CL 105 01/15/2018 0503   CL 108 01/13/2018 2210   CL 103 01/13/2018 0701   CO2 28 01/15/2018 0503   CO2 22 01/13/2018 2210   CO2 21 (L) 01/13/2018 0701   GLUCOSE 123 (H) 01/15/2018 0503   GLUCOSE 190 (H) 01/13/2018 2210   GLUCOSE 107 (H) 01/13/2018 0701   BUN <5 (L) 01/15/2018 0503   BUN 8 01/13/2018 2210   BUN 19 01/13/2018 0701   CREATININE 0.77 01/15/2018 0503   CREATININE 0.81 01/13/2018 2210   CREATININE  0.81 01/13/2018 0701   CREATININE 0.75 12/26/2017 1258   CALCIUM 7.7 (L) 01/15/2018 0503   CALCIUM 7.8 (L) 01/13/2018 2210   CALCIUM 8.8 (L) 01/13/2018 0701   GFRNONAA >60 01/15/2018 0503   GFRNONAA >60 01/13/2018 2210   GFRNONAA >60 01/13/2018 0701   GFRAA >60 01/15/2018 0503   GFRAA >60 01/13/2018 2210   GFRAA >60 01/13/2018 0701   CBC    Component Value Date/Time   WBC 10.2 01/15/2018 2020   RBC 3.75 (L) 01/15/2018 2020   HGB 11.0 (L) 01/15/2018 2020   HCT 35.0 (L) 01/15/2018 2020   PLT 257 01/15/2018 2020   MCV 93.3 01/15/2018 2020   MCH 29.3 01/15/2018 2020   MCHC 31.4 01/15/2018 2020   RDW 15.5 01/15/2018 2020   LYMPHSABS 3.4 01/15/2018 2020   MONOABS 1.3 (H) 01/15/2018 2020   EOSABS 0.4 01/15/2018 2020   BASOSABS 0.0 01/15/2018 2020   HEPATIC Function Panel Recent Labs    12/28/17 1746 01/13/18 0701 01/15/18 0503  PROT 7.2 6.6 4.6*   HEMOGLOBIN A1C No components found for: HGA1C,  MPG CARDIAC ENZYMES Lab Results  Component Value Date   CKTOTAL 11 11/13/2009   CKMB 0.7 11/13/2009   TROPONINI 0.03 (HH) 01/15/2018   TROPONINI 0.03 (HH) 01/14/2018   TROPONINI <0.03 06/24/2016   BNP No results for input(s): PROBNP in the last 8760 hours. TSH No results for input(s): TSH in the  last 8760 hours. CHOLESTEROL No results for input(s): CHOL in the last 8760 hours.  Scheduled Meds: . aspirin EC  81 mg Oral Daily  . docusate sodium  100 mg Oral BID  . feeding supplement (ENSURE ENLIVE)  237 mL Oral BID BM  . furosemide  20 mg Intravenous Once  . gabapentin  300 mg Oral TID  . metoprolol tartrate  12.5 mg Oral BID  . oxyCODONE  30 mg Oral Q12H  . pantoprazole  40 mg Oral Daily  . polyethylene glycol  17 g Oral Daily  . pravastatin  10 mg Oral q1800  . rifampin  300 mg Oral Daily  . sodium chloride flush  3 mL Intravenous Q12H   Continuous Infusions: . sodium chloride Stopped (01/15/18 0830)  . sodium chloride 125 mL/hr at 01/15/18 1756  . sodium  chloride    . vancomycin Stopped (01/15/18 1932)   PRN Meds:.alum & mag hydroxide-simeth, bisacodyl, ibuprofen, LORazepam, menthol-cetylpyridinium **OR** phenol, morphine injection, nitroGLYCERIN, ondansetron **OR** ondansetron (ZOFRAN) IV, oxyCODONE, polyethylene glycol, sodium chloride flush, sodium phosphate  Assessment/Plan: Chest pain MI unlikely, demand ischemia likely Recent lumbar spine surgery CAD Tobacco use disorder H/O hepatitis C Anemia- improving Fever  Possible staph bacteremia  Continue IV antibiotics and analgesics as needed.   LOS: 2 days    Orpah CobbAjay Torin Whisner  MD  01/15/2018, 10:09 PM

## 2018-01-15 NOTE — Evaluation (Signed)
Occupational Therapy Evaluation Patient Details Name: [name] MRN: [id number] DOB: Mar 18, 1956 Today's Date: 01/15/2018    History of Present Illness 62yo female with history of LBP and radicular symptoms progressive in nature and who failed conservative treatments. She received a L4-L5 transforaminal interbody fusion on 01/13/18. PMH hepatitis C, OA, carpal tunnel syndrome, hx bursitis, hx seizures    Clinical Impression   Pt admitted with above. She demonstrates the below listed deficits and will benefit from continued OT to maximize safety and independence with BADLs.  Pt requires mod - max a for LB ADLs, and min A for functional transfers. BP remained stable this date.  She was fully independent PTA, and has very supportive family who will be able to provide necessary level of assist at discharge.  Will follow acutely.       Follow Up Recommendations  Home health OT;Supervision/Assistance - 24 hour    Equipment Recommendations  None recommended by OT    Recommendations for Other Services       Precautions / Restrictions Precautions Precautions: Back Precaution Comments: Pt able to recall 1/3 back precautions  Required Braces or Orthoses: Spinal Brace Spinal Brace: Lumbar corset;Applied in sitting position      Mobility Bed Mobility Overal bed mobility: Needs Assistance Bed Mobility: Rolling;Sidelying to Sit Rolling: Mod assist Sidelying to sit: Mod assist       General bed mobility comments: step by step instruction. Assist to roll and to push up on elbow   Transfers Overall transfer level: Needs assistance Equipment used: Rolling walker (2 wheeled) Transfers: Sit to/from [employer]Stand;Stand Pivot Transfers Sit to Stand: Min assist Stand pivot transfers: Min assist       General transfer comment: min A to steady     Balance Overall balance assessment: Needs assistance Sitting-balance support: No upper extremity supported Sitting balance-Leahy Scale: Fair                                      ADL either performed or assessed with clinical judgement   ADL Overall ADL's : Needs assistance/impaired Eating/Feeding: Independent   Grooming: Wash/dry hands;Wash/dry face;Oral care;Brushing hair;Set up;Sitting   Upper Body Bathing: Minimal assistance;Sitting   Lower Body Bathing: Maximal assistance;Sit to/from stand   Upper Body Dressing : Minimal assistance;Sitting   Lower Body Dressing: Maximal assistance;Sit to/from stand   Toilet Transfer: Minimal assistance;Ambulation;Comfort height toilet;Grab bars;RW   Toileting- Clothing Manipulation and Hygiene: Maximal assistance;Sit to/from stand       Functional mobility during ADLs: Minimal assistance;+2 for safety/equipment;Rolling walker General ADL Comments: Pt groggy due to pain meds.  Moves slowly.  Assist to access feet      Vision         Perception     Praxis      Pertinent Vitals/Pain Pain Assessment: Faces Faces Pain Scale: Hurts a little bit Pain Location: back  Pain Descriptors / Indicators: Operative site guarding Pain Intervention(s): Premedicated before session     Hand Dominance     Extremity/Trunk Assessment Upper Extremity Assessment Upper Extremity Assessment: Overall WFL for tasks assessed   Lower Extremity Assessment Lower Extremity Assessment: Defer to PT evaluation       Communication Communication Communication: No difficulties   Cognition Arousal/Alertness: Awake/alert Behavior During Therapy: WFL for tasks assessed/performed Overall Cognitive Status: Within Functional Limits for tasks assessed  General Comments: Pt mildly delayed likely due to pain meds    General Comments  Pt instructed how to don/doff brace - requires max A     Exercises     Shoulder Instructions      Home Living Family/patient expects to be discharged to:: Private residence Living Arrangements:  Parent;Children Available Help at Discharge: Family;Available 24 hours/day Type of Home: House Home Access: Stairs to enter [employer] of Steps: 5 Entrance Stairs-Rails: None Home Layout: One level     Bathroom Shower/Tub: Chief Strategy Officer: Standard     Home Equipment: Environmental consultant - 2 wheels;Bedside commode          Prior Functioning/Environment Level of Independence: Independent with assistive device(s)                 OT Problem List: Decreased activity tolerance;Impaired balance (sitting and/or standing);Decreased safety awareness;Decreased knowledge of use of DME or AE;Decreased knowledge of precautions;Pain      OT Treatment/Interventions: Self-care/ADL training;DME and/or AE instruction;Therapeutic activities;Patient/family education    OT Goals(Current goals can be found in the care plan section) Acute Rehab OT Goals Patient Stated Goal: to get better and go home  OT Goal Formulation: With patient/family Time For Goal Achievement: 01/29/18 Potential to Achieve Goals: Good ADL Goals Pt Will Perform Grooming: with supervision;standing Pt Will Perform Upper Body Bathing: with supervision;sitting Pt Will Perform Lower Body Bathing: with supervision;with adaptive equipment;sit to/from stand Pt Will Perform Upper Body Dressing: with supervision;sitting Pt Will Perform Lower Body Dressing: with supervision;with adaptive equipment;sit to/from stand Pt Will Perform Toileting - Clothing Manipulation and hygiene: with supervision;sit to/from stand Pt Will Perform Tub/Shower Transfer: Tub transfer;with min guard assist;ambulating;3 in 1;rolling walker  OT Frequency: Min 2X/week   Barriers to D/C:            Co-evaluation PT/OT/SLP Co-Evaluation/Treatment: Yes Reason for Co-Treatment: For patient/therapist safety;To address functional/ADL transfers   OT goals addressed during session: ADL's and self-care      AM-PAC PT "6 Clicks" Daily  Activity     Outcome Measure Help from another person eating meals?: None Help from another person taking care of personal grooming?: A Little Help from another person toileting, which includes using toliet, bedpan, or urinal?: A Lot Help from another person bathing (including washing, rinsing, drying)?: A Lot Help from another person to put on and taking off regular upper body clothing?: A Little Help from another person to put on and taking off regular lower body clothing?: A Lot 6 Click Score: 16   End of Session Equipment Utilized During Treatment: Rolling walker;Back brace Nurse Communication: Mobility status  Activity Tolerance: Patient tolerated treatment well Patient left: in chair;with call bell/phone within reach;with family/visitor present  OT Visit Diagnosis: Pain Pain - part of body: (back)                Time: 1610-9604 OT Time Calculation (min): 26 min Charges:  OT General Charges $OT Visit: 1 Visit OT Evaluation $OT Eval Moderate Complexity: 1 Mod G-Codes:     [employer], OTR/L [phone]   Jeani Hawking M 01/15/2018, 4:19 PM

## 2018-01-15 NOTE — Progress Notes (Signed)
     Subjective: 2 Days Post-Op Procedure(s) (LRB): Transforaminal lumbar interbody fusion L4-5 with screws, cages and rods, local bone graft, Allograft, Vivigen, Decompression L4-5 and L5-S1 (N/A) Awake, alert and oriented x4. Legs N-V normal right leg pain is much better post op. Pain complaints need to be careful as she tends to request more even when showing signs of adequate sedation and pain control. Scleral icterus.   Patient reports pain as moderate.    Objective:   VITALS:  Temp:  [99.2 F (37.3 C)-101.8 F (38.8 C)] 101 F (38.3 C) (04/04 1600) Pulse Rate:  [67-88] 82 (04/04 1600) Resp:  [15-22] 22 (04/04 1600) BP: (89-129)/(48-80) 97/80 (04/04 1600) SpO2:  [93 %-96 %] 96 % (04/04 1600)  Neurologically intact ABD soft Neurovascular intact Sensation intact distally Intact pulses distally Dorsiflexion/Plantar flexion intact Incision: scant drainage and Dressing changed.    LABS Recent Labs    01/13/18 0701 01/13/18 2210 01/14/18 1204 01/15/18 0503  HGB 13.7 9.5* 8.4* 11.2*  WBC 9.7 11.7* 11.2* 10.0  PLT 416* 286 274 249   Recent Labs    01/13/18 2210 01/15/18 0503  NA 139 140  K 3.6 3.8  CL 108 105  CO2 22 28  BUN 8 <5*  CREATININE 0.81 0.77  GLUCOSE 190* 123*   Recent Labs    01/13/18 0701  INR 1.10     Assessment/Plan: 2 Days Post-Op Procedure(s) (LRB): Transforaminal lumbar interbody fusion L4-5 with screws, cages and rods, local bone graft, Allograft, Vivigen, Decompression L4-5 and L5-S1 (N/A)  Anemia due to blood loss resolved.   Advance diet Up with therapy Continue foley due to diuresing patient, urinary output monitoring and Plan to discontinue in the AM Renal cyst assess with ultrasound tomorrow.  Assess for any sign of elevated LFTs.May relate to transfusion reaction and chronic Hep C and tylenol.  D/c tylenol. Use asa or ibuprofen for elevated temp Ice to lumbar incision. IS q 1 hour when awake.  IV antibiotics vancomycin and  rifampin. Anemia is resolved.  Pathology with inflamatory changes within tissues about right L5-S1 facet, check ANA and RF.  Uric acid level.   [name] 01/15/2018, 7:11 PMPatient ID: [name], female   DOB: 1956/05/29, 62 y.o.   MRN: [id number]

## 2018-01-15 NOTE — Progress Notes (Signed)
INFECTIOUS DISEASE PROGRESS NOTE  ID: [name] is a 62 y.o. female with  Principal Problem:   Spondylolisthesis, lumbar region Active Problems:   Spinal stenosis of lumbar region   Fusion of lumbar spine  Subjective: Feels poorly.   Abtx:  Anti-infectives (From admission, onward)   Start     Dose/Rate Route Frequency Ordered Stop   01/14/18 1715  rifampin (RIFADIN) capsule 300 mg     300 mg Oral Daily 01/14/18 1713     01/14/18 1600  vancomycin (VANCOCIN) 1,250 mg in sodium chloride 0.9 % 250 mL IVPB     1,250 mg 166.7 mL/hr over 90 Minutes Intravenous Every 24 hours 01/14/18 1510     01/13/18 1630  ceFAZolin (ANCEF) IVPB 1 g/50 mL premix     1 g 100 mL/hr over 30 Minutes Intravenous Every 8 hours 01/13/18 1610 01/14/18 0559   01/13/18 0543  ceFAZolin (ANCEF) IVPB 2g/100 mL premix     2 g 200 mL/hr over 30 Minutes Intravenous On call to O.R. 01/13/18 0543 01/13/18 0745      Medications:  Scheduled: . aspirin EC  81 mg Oral Daily  . docusate sodium  100 mg Oral BID  . furosemide  20 mg Intravenous Once  . gabapentin  300 mg Oral TID  . metoprolol tartrate  12.5 mg Oral BID  . oxyCODONE  30 mg Oral Q12H  . pantoprazole  40 mg Oral Daily  . pravastatin  10 mg Oral q1800  . rifampin  300 mg Oral Daily  . sodium chloride flush  3 mL Intravenous Q12H    Objective: Vital signs in last 24 hours: Temp:  [99.2 F (37.3 C)-101.8 F (38.8 C)] 101 F (38.3 C) (04/04 0830) Pulse Rate:  [67-88] 74 (04/04 0802) Resp:  [15-22] 19 (04/04 0600) BP: (88-129)/(47-74) 129/73 (04/04 0802) SpO2:  [93 %-100 %] 94 % (04/04 0802)   General appearance: alert, cooperative and no distress Resp: clear to auscultation bilaterally Cardio: regular rate and rhythm GI: normal findings: bowel sounds normal and soft, non-tender  Lab Results Recent Labs    01/13/18 2210 01/14/18 1204 01/15/18 0503  WBC 11.7* 11.2* 10.0  HGB 9.5* 8.4* 11.2*  HCT 29.2* 26.7* 34.4*  NA 139  --   140  K 3.6  --  3.8  CL 108  --  105  CO2 22  --  28  BUN 8  --  <5*  CREATININE 0.81  --  0.77   Liver Panel Recent Labs    01/13/18 0701 01/15/18 0503  PROT 6.6 4.6*  ALBUMIN 3.2* 2.4*  AST 19 19  ALT 14 9*  ALKPHOS 88 56  BILITOT 0.5 0.9   Sedimentation Rate No results for input(s): ESRSEDRATE in the last 72 hours. C-Reactive Protein No results for input(s): CRP in the last 72 hours.  Microbiology: Recent Results (from the past 240 hour(s))  Surgical pcr screen     Status: None   Collection Time: 01/13/18  5:42 AM  Result Value Ref Range Status   MRSA, PCR NEGATIVE NEGATIVE Final   Staphylococcus aureus NEGATIVE NEGATIVE Final    Comment: (NOTE) The Xpert SA Assay (FDA approved for NASAL specimens in patients 62 years of age and older), is one component of a comprehensive surveillance program. It is not intended to diagnose infection nor to guide or monitor treatment. Performed at [facility], [address] [place], [place] [address]   Aerobic/Anaerobic Culture (surgical/deep wound)  Status: None (Preliminary result)   Collection Time: 01/13/18 10:28 AM  Result Value Ref Range Status   Specimen Description WOUND BACK CYSTIC AREA L5 S1 FACET  Final   Special Requests SPEC A ON SWABS  Final   Gram Stain   Final    FEW WBC PRESENT,BOTH PMN AND MONONUCLEAR NO ORGANISMS SEEN    Culture   Final    FEW STAPHYLOCOCCUS AUREUS SUSCEPTIBILITIES TO FOLLOW Performed at [facility], [address], [place], [place] [address]    Report Status PENDING  Incomplete  Acid Fast Smear (AFB)     Status: None   Collection Time: 01/13/18 10:28 AM  Result Value Ref Range Status   AFB Specimen Processing Concentration  Final   Acid Fast Smear Negative  Final    Comment: (NOTE) Performed At: [facility] [address] [place], [place] [id number] Jolene Schimke MD NW:[id number]    Source (AFB) WOUND  Final    Comment: BACK CYSTIC AREA L5 S1  FACET Performed at [facility], [address], [place], [place] [address]     Studies/Results: Dg Lumbar Spine Complete  Result Date: 01/13/2018 CLINICAL DATA:  Lumbar fusion at L4-5 EXAM: LUMBAR SPINE - COMPLETE 4+ VIEW; DG C-ARM 61-120 MIN COMPARISON:  12/25/2017 FLUOROSCOPY TIME:  Radiation Exposure Index (as provided by the fluoroscopic device): 32.74 mGy If the device does not provide the exposure index: Fluoroscopy Time:  77 seconds Number of Acquired Images:  4 FINDINGS: Initial frontal images demonstrate pedicle screws at L4 and L5 with interbody fusion new from the prior MRI examination. Surgical instrument is noted just to the right of the midline. The lateral images demonstrate similar pedicle screws with interbody fusion. IMPRESSION: L4-5 fusion. Electronically Signed   By: Alcide Clever M.D.   On: 01/13/2018 13:54   Dg Chest Port 1 View  Result Date: 01/14/2018 CLINICAL DATA:  Chest pain at rest. EXAM: PORTABLE CHEST 1 VIEW COMPARISON:  Two-view chest x-ray 12/28/2017 FINDINGS: The patient is significantly rotated to the right. Heart size is normal. There is no edema or effusion. No focal airspace disease is present. The visualized soft tissues and bony thorax are unremarkable. IMPRESSION: Negative one-view chest x-ray Electronically Signed   By: Marin Roberts M.D.   On: 01/14/2018 15:10   Dg C-arm 1-60 Min  Result Date: 01/13/2018 CLINICAL DATA:  Lumbar fusion at L4-5 EXAM: LUMBAR SPINE - COMPLETE 4+ VIEW; DG C-ARM 61-120 MIN COMPARISON:  12/25/2017 FLUOROSCOPY TIME:  Radiation Exposure Index (as provided by the fluoroscopic device): 32.74 mGy If the device does not provide the exposure index: Fluoroscopy Time:  77 seconds Number of Acquired Images:  4 FINDINGS: Initial frontal images demonstrate pedicle screws at L4 and L5 with interbody fusion new from the prior MRI examination. Surgical instrument is noted just to the right of the midline. The lateral images demonstrate  similar pedicle screws with interbody fusion. IMPRESSION: L4-5 fusion. Electronically Signed   By: Alcide Clever M.D.   On: 01/13/2018 13:54   Dg C-arm 1-60 Min  Result Date: 01/13/2018 CLINICAL DATA:  Lumbar fusion at L4-5 EXAM: LUMBAR SPINE - COMPLETE 4+ VIEW; DG C-ARM 61-120 MIN COMPARISON:  12/25/2017 FLUOROSCOPY TIME:  Radiation Exposure Index (as provided by the fluoroscopic device): 32.74 mGy If the device does not provide the exposure index: Fluoroscopy Time:  77 seconds Number of Acquired Images:  4 FINDINGS: Initial frontal images demonstrate pedicle screws at L4 and L5 with interbody fusion new  from the prior MRI examination. Surgical instrument is noted just to the right of the midline. The lateral images demonstrate similar pedicle screws with interbody fusion. IMPRESSION: L4-5 fusion. Electronically Signed   By: Alcide Clever M.D.   On: 01/13/2018 13:54   Dg C-arm 1-60 Min  Result Date: 01/13/2018 CLINICAL DATA:  Lumbar fusion at L4-5 EXAM: LUMBAR SPINE - COMPLETE 4+ VIEW; DG C-ARM 61-120 MIN COMPARISON:  12/25/2017 FLUOROSCOPY TIME:  Radiation Exposure Index (as provided by the fluoroscopic device): 32.74 mGy If the device does not provide the exposure index: Fluoroscopy Time:  77 seconds Number of Acquired Images:  4 FINDINGS: Initial frontal images demonstrate pedicle screws at L4 and L5 with interbody fusion new from the prior MRI examination. Surgical instrument is noted just to the right of the midline. The lateral images demonstrate similar pedicle screws with interbody fusion. IMPRESSION: L4-5 fusion. Electronically Signed   By: Alcide Clever M.D.   On: 01/13/2018 13:54     Assessment/Plan: Staph aureus wound infection L4-5 fusion with screws, rods, cage, bone graft 01-13-18 Anemia, Hepatitis C Chronic narcotic use Transfusion reaction (fev er, decreased BP).  Borderline troponin  Total days of antibiotics: 1 vanco/rifampin  Await sensi on her S aureus Continue  vanco/rifampin Await Hep C labs (she has not been treated previously) Watch fever (post transfusion reaction?) Hold on pic til afebrile Family wants to care for her at home (as opposed to SNF)         Johny Sax MD, FACP Infectious Diseases (pager) [phone] [email or web address] 01/15/2018, 10:00 AM  LOS: 2 days

## 2018-01-15 NOTE — Progress Notes (Signed)
Pt transferred to unit at 1855 with first unit of blood transfusing. First unit stopped at 1952. Second unit started at 2100 with temp 99.5 oral. 15 min post start temp 101.4 axillary. Blood transfusion stopped and on call paged. On call stated that he believes rising temp is from surgery and to give tylenol and if temp responds to tylenol then to resume blood. Temp had come down to 100.3 and blood transfusion restarted. A hour after transfusion restarted temp rechecked and had elevated to 101.4 axillary, rechecked rectally with temp of 101.8. BP also had began to start trending down, as low as 89/52. Paged on call MD back. On call stated to stop blood transfusion, have pt keep doing incentive spirometer and when tylenol is due, to give again. No other new orders received. Blood bank called. Blood returned to blood bank. Will continue to monitor.

## 2018-01-16 ENCOUNTER — Inpatient Hospital Stay (HOSPITAL_COMMUNITY): Payer: Self-pay

## 2018-01-16 DIAGNOSIS — D62 Acute posthemorrhagic anemia: Secondary | ICD-10-CM | POA: Diagnosis not present

## 2018-01-16 DIAGNOSIS — M4626 Osteomyelitis of vertebra, lumbar region: Secondary | ICD-10-CM | POA: Diagnosis present

## 2018-01-16 DIAGNOSIS — M869 Osteomyelitis, unspecified: Secondary | ICD-10-CM

## 2018-01-16 LAB — COMPREHENSIVE METABOLIC PANEL
ALT: 16 U/L (ref 14–54)
AST: 26 U/L (ref 15–41)
Albumin: 2.3 g/dL — ABNORMAL LOW (ref 3.5–5.0)
Alkaline Phosphatase: 76 U/L (ref 38–126)
Anion gap: 6 (ref 5–15)
CHLORIDE: 107 mmol/L (ref 101–111)
CO2: 27 mmol/L (ref 22–32)
Calcium: 8.1 mg/dL — ABNORMAL LOW (ref 8.9–10.3)
Creatinine, Ser: 0.63 mg/dL (ref 0.44–1.00)
GFR calc Af Amer: >60 mL/min (ref >60)
Glucose, Bld: 140 mg/dL — ABNORMAL HIGH (ref 65–99)
Potassium: 4.1 mmol/L (ref 3.5–5.1)
SODIUM: 140 mmol/L (ref 135–145)
Total Bilirubin: 0.8 mg/dL (ref 0.3–1.2)
Total Protein: 4.8 g/dL — ABNORMAL LOW (ref 6.5–8.1)

## 2018-01-16 LAB — HEPATITIS PANEL, ACUTE
HCV Ab: >11 s/co ratio — ABNORMAL HIGH (ref 0.0–0.9)
HEP B C IGM: NEGATIVE
Hep A IgM: NEGATIVE
Hepatitis B Surface Ag: NEGATIVE

## 2018-01-16 LAB — URIC ACID: URIC ACID, SERUM: 2.8 mg/dL (ref 2.3–6.6)

## 2018-01-16 MED FILL — Heparin Sodium (Porcine) Inj 1000 Unit/ML: INTRAMUSCULAR | Qty: 30 | Status: AC

## 2018-01-16 MED FILL — Sodium Chloride IV Soln 0.9%: INTRAVENOUS | Qty: 1000 | Status: AC

## 2018-01-16 MED FILL — Sodium Chloride Irrigation Soln 0.9%: Qty: 3000 | Status: AC

## 2018-01-16 NOTE — Progress Notes (Signed)
Occupational Therapy Treatment Patient Details Name: Linda SizerCheryl K Hoover MRN: [id number] DOB: 1956-08-26 Today's Date: 01/16/2018    History of present illness 62yo female with history of LBP and radicular symptoms progressive in nature and who failed conservative treatments. She received a L4-L5 transforaminal interbody fusion on 01/13/18. PMH hepatitis C, OA, carpal tunnel syndrome, hx bursitis, hx seizures    OT comments  Pt more alert and participatory today.  She requires mod A and mod cues to don brace.  She requires min cues to maintain back precautions.  She is unable to access feet for LB ADLs - discussed use of AE, but she states her family will assist her.  She requires min guard assist for functional mobility.    Follow Up Recommendations  Home health OT;Supervision/Assistance - 24 hour    Equipment Recommendations  None recommended by OT    Recommendations for Other Services      Precautions / Restrictions Precautions Precautions: Back Precaution Comments: Pt able to recall 2/3 back precautions and requires min A to recall  Required Braces or Orthoses: Spinal Brace Spinal Brace: Lumbar corset;Applied in sitting position       Mobility Bed Mobility Overal bed mobility: Needs Assistance Bed Mobility: Rolling;Sidelying to Sit;Sit to Sidelying Rolling: Min guard Sidelying to sit: Min guard     Sit to sidelying: Min guard    Transfers Overall transfer level: Needs assistance Equipment used: Rolling walker (2 wheeled) Transfers: Sit to/from [employer];Stand Pivot Transfers Sit to Stand: Min guard Stand pivot transfers: Min guard            Balance Overall balance assessment: Needs assistance   Sitting balance-Leahy Scale: Fair     Standing balance support: During functional activity Standing balance-Leahy Scale: Fair                             ADL either performed or assessed with clinical judgement   ADL Overall ADL's : Needs assistance/impaired                      Lower Body Dressing: Maximal assistance;Sit to/from stand Lower Body Dressing Details (indicate cue type and reason): Pt unable to cross ankles over knees to access feet.  She demonstrates understanding of correct method for performing LB ADLs.  She reports family will assist with this at home.  DId discuss use of AE, but she indicates she doesn't need it as family will be available  Toilet Transfer: Min guard;Ambulation;Comfort height toilet;Grab bars;RW   Toileting- ArchitectClothing Manipulation and Hygiene: Min guard;Sit to/from stand       Functional mobility during ADLs: Min guard;Rolling walker General ADL Comments: Pt required mod A and mod verbal cues to don brace seated EOB      Vision       Perception     Praxis      Cognition Arousal/Alertness: Awake/alert Behavior During Therapy: WFL for tasks assessed/performed Overall Cognitive Status: Within Functional Limits for tasks assessed                                 General Comments: Pt initially very irritable, but mood improved during session         Exercises     Shoulder Instructions       General Comments      Pertinent Vitals/ Pain  Pain Assessment: Faces Faces Pain Scale: Hurts little more Pain Location: back  Pain Descriptors / Indicators: Operative site guarding Pain Intervention(s): Monitored during session;Premedicated before session  Home Living                                          Prior Functioning/Environment              Frequency  Min 2X/week        Progress Toward Goals  OT Goals(current goals can now be found in the care plan section)  Progress towards OT goals: Progressing toward goals     Plan Discharge plan remains appropriate    Co-evaluation                 AM-PAC PT "6 Clicks" Daily Activity     Outcome Measure   Help from another person eating meals?: None Help from another person taking  care of personal grooming?: A Little Help from another person toileting, which includes using toliet, bedpan, or urinal?: A Little Help from another person bathing (including washing, rinsing, drying)?: A Lot Help from another person to put on and taking off regular upper body clothing?: A Little Help from another person to put on and taking off regular lower body clothing?: A Lot 6 Click Score: 17    End of Session Equipment Utilized During Treatment: Rolling walker;Back brace  OT Visit Diagnosis: Pain Pain - part of body: (back )   Activity Tolerance Patient tolerated treatment well   Patient Left in bed;with call bell/phone within reach;with bed alarm set;with family/visitor present   Nurse Communication Mobility status        Time: 9562-1308 OT Time Calculation (min): 32 min  Charges: OT General Charges $OT Visit: 1 Visit OT Treatments $Self Care/Home Management : 8-22 mins $Therapeutic Activity: 8-22 mins  [employer], OTR/L [phone]    Jeani Hawking M 01/16/2018, 5:40 PM

## 2018-01-16 NOTE — Progress Notes (Signed)
[facility] over weekend if needed.   [facility] will [facility] pt and support IV ABX and HH services at DC.   If patient discharges after hours, please call [phone].   Sedalia Mutaamela S Chandler 01/16/2018, 5:58 PM

## 2018-01-16 NOTE — Progress Notes (Signed)
     Subjective: 3 Days Post-Op Procedure(s) (LRB): Transforaminal lumbar interbody fusion L4-5 with screws, cages and rods, local bone graft, Allograft, Vivigen, Decompression L4-5 and L5-S1 (N/A)Awake, alert and oriented x 4. Up in bed with PT assist. Fever is improved to 99 this AM. Renal U/S with mild enlargement of left renal cyst rest of kidney eval is normal including bladder. Foley catheter discontinued. Day #2 Vancomycin/Rifampin. Culture postive right L5-S1 facet infection with Staph.  Sensitivities are still pending.  Patient reports pain as moderate.    Objective:   VITALS:  Temp:  [99.2 F (37.3 C)-101.5 F (38.6 C)] 99.4 F (37.4 C) (04/05 0827) Pulse Rate:  [66-82] 73 (04/05 0800) Resp:  [15-22] 22 (04/05 0800) BP: (95-121)/(54-89) 114/65 (04/05 0800) SpO2:  [91 %-96 %] 94 % (04/05 0800)  Neurologically intact ABD soft Neurovascular intact Sensation intact distally Intact pulses distally Dorsiflexion/Plantar flexion intact Incision: dressing C/D/I   LABS Recent Labs    01/13/18 2210 01/14/18 1204 01/15/18 0503 01/15/18 2020  HGB 9.5* 8.4* 11.2* 11.0*  WBC 11.7* 11.2* 10.0 10.2  PLT 286 274 249 257   Recent Labs    01/15/18 0503 01/16/18 0751  NA 140 140  K 3.8 4.1  CL 105 107  CO2 28 27  BUN <5* <5*  CREATININE 0.77 0.63  GLUCOSE 123* 140*   No results for input(s): LABPT, INR in the last 72 hours. LFTS are normal UA with Hgb positive c/w either transfusion reaction or catheter irritation.  Renal function is normal. Low protein likely poor protein intake/malnourishment.   Assessment/Plan: 3 Days Post-Op Procedure(s) (LRB): Transforaminal lumbar interbody fusion L4-5 with screws, cages and rods, local bone graft, Allograft, Vivigen, Decompression L4-5 and L5-S1 (N/A)  Angina, improved. Lumbar infection right L5-S1 facet.    Advance diet Up with therapy Continue ABX therapy due to Post-op infection  Will need PICCU line for prolong  antibiotics. Needs to be afebrile for PICCU line placement.  Nutrition consult.  Vira BrownsJames Dellis Voght 01/16/2018, 10:39 AMPatient ID: [name], female   DOB: 1956/06/19, 62 y.o.   MRN: [id number]

## 2018-01-16 NOTE — Progress Notes (Signed)
Offered Pt a bath several times and Pt continues to state that she got a bath last night and passes on a bath for today. RN aware.

## 2018-01-16 NOTE — Progress Notes (Signed)
INFECTIOUS DISEASE PROGRESS NOTE  ID: [name] is a 62 y.o. female with  Principal Problem:   Anemia due to blood loss, acute Active Problems:   Spinal stenosis of lumbar region   Spondylolisthesis, lumbar region   Fusion of lumbar spine   Infection of lumbar spine (HCC)  Subjective: Feels poorly, constipated.  Has ambulated.   Abtx:  Anti-infectives (From admission, onward)   Start     Dose/Rate Route Frequency Ordered Stop   01/14/18 1715  rifampin (RIFADIN) capsule 300 mg     300 mg Oral Daily 01/14/18 1713     01/14/18 1600  vancomycin (VANCOCIN) 1,250 mg in sodium chloride 0.9 % 250 mL IVPB     1,250 mg 166.7 mL/hr over 90 Minutes Intravenous Every 24 hours 01/14/18 1510     01/13/18 1630  ceFAZolin (ANCEF) IVPB 1 g/50 mL premix     1 g 100 mL/hr over 30 Minutes Intravenous Every 8 hours 01/13/18 1610 01/14/18 0559   01/13/18 0543  ceFAZolin (ANCEF) IVPB 2g/100 mL premix     2 g 200 mL/hr over 30 Minutes Intravenous On call to O.R. 01/13/18 0543 01/13/18 0745      Medications:  Scheduled: . aspirin EC  81 mg Oral Daily  . docusate sodium  100 mg Oral BID  . furosemide  20 mg Intravenous Once  . gabapentin  300 mg Oral TID  . metoprolol tartrate  12.5 mg Oral BID  . oxyCODONE  30 mg Oral Q12H  . pantoprazole  40 mg Oral Daily  . polyethylene glycol  17 g Oral Daily  . pravastatin  10 mg Oral q1800  . rifampin  300 mg Oral Daily  . sodium chloride flush  3 mL Intravenous Q12H    Objective: Vital signs in last 24 hours: Temp:  [99.2 F (37.3 C)-101.5 F (38.6 C)] 99.4 F (37.4 C) (04/05 0827) Pulse Rate:  [66-82] 73 (04/05 0800) Resp:  [15-22] 22 (04/05 0800) BP: (95-121)/(54-89) 114/65 (04/05 0800) SpO2:  [91 %-96 %] 94 % (04/05 0800)   General appearance: alert, cooperative and no distress Resp: clear to auscultation bilaterally Cardio: regular rate and rhythm GI: normal findings: bowel sounds normal and soft, non-tender  Lab  Results Recent Labs    01/15/18 0503 01/15/18 2020 01/16/18 0751  WBC 10.0 10.2  --   HGB 11.2* 11.0*  --   HCT 34.4* 35.0*  --   NA 140  --  140  K 3.8  --  4.1  CL 105  --  107  CO2 28  --  27  BUN <5*  --  <5*  CREATININE 0.77  --  0.63   Liver Panel Recent Labs    01/15/18 0503 01/16/18 0751  PROT 4.6* 4.8*  ALBUMIN 2.4* 2.3*  AST 19 26  ALT 9* 16  ALKPHOS 56 76  BILITOT 0.9 0.8   Sedimentation Rate No results for input(s): ESRSEDRATE in the last 72 hours. C-Reactive Protein No results for input(s): CRP in the last 72 hours.  Microbiology: Recent Results (from the past 240 hour(s))  Surgical pcr screen     Status: None   Collection Time: 01/13/18  5:42 AM  Result Value Ref Range Status   MRSA, PCR NEGATIVE NEGATIVE Final   Staphylococcus aureus NEGATIVE NEGATIVE Final    Comment: (NOTE) The Xpert SA Assay (FDA approved for NASAL specimens in patients 22 years of age and older), is one component of a comprehensive surveillance  program. It is not intended to diagnose infection nor to guide or monitor treatment. Performed at [facility], [address] [place], [place] [address]   Aerobic/Anaerobic Culture (surgical/deep wound)     Status: None (Preliminary result)   Collection Time: 01/13/18 10:28 AM  Result Value Ref Range Status   Specimen Description WOUND BACK CYSTIC AREA L5 S1 FACET  Final   Special Requests SPEC A ON SWABS  Final   Gram Stain   Final    FEW WBC PRESENT,BOTH PMN AND MONONUCLEAR NO ORGANISMS SEEN    Culture   Final    FEW STAPHYLOCOCCUS AUREUS repeating susceptibilities Performed at [facility], [address] [place], [place] [address]    Report Status PENDING  Incomplete  Acid Fast Smear (AFB)     Status: None   Collection Time: 01/13/18 10:28 AM  Result Value Ref Range Status   AFB Specimen Processing Concentration  Final   Acid Fast Smear Negative  Final    Comment: (NOTE) Performed At: [facility]  [place] [address] [place], [place] [id number] Jolene SchimkeNagendra Sanjai MD BM:[phone]    Source (AFB) WOUND  Final    Comment: BACK CYSTIC AREA L5 S1 FACET Performed at [facility], [address] [place], [place] [address]     Studies/Results: [place] Renal  Result Date: 01/16/2018 CLINICAL DATA:  Left renal cyst EXAM: RENAL / URINARY TRACT ULTRASOUND COMPLETE COMPARISON:  CT abdomen and pelvis October 12, 2013 FINDINGS: Right Kidney: Length: 11.1 cm. Echogenicity and renal cortical thickness are within normal limits. No mass, perinephric fluid, or hydronephrosis visualized. No sonographically demonstrable calculus or ureterectasis. Left Kidney: Length: 10.8 cm. Echogenicity and renal cortical thickness are within normal limits. No perinephric fluid or hydronephrosis visualized. There is a cyst arising at the upper to mid portion of the left kidney measuring 2.5 x 2.6 x 2.6 cm. No other mass evident. No sonographically demonstrable calculus or ureterectasis. Bladder: Appears normal for degree of bladder distention. IMPRESSION: Cyst in left kidney, marginally larger than on 2014 study. Study otherwise unremarkable. Electronically Signed   By: Bretta BangWilliam  Woodruff III M.D.   On: 01/16/2018 09:41   Dg Chest Port 1 View  Result Date: 01/14/2018 CLINICAL DATA:  Chest pain at rest. EXAM: PORTABLE CHEST 1 VIEW COMPARISON:  Two-view chest x-ray 12/28/2017 FINDINGS: The patient is significantly rotated to the right. Heart size is normal. There is no edema or effusion. No focal airspace disease is present. The visualized soft tissues and bony thorax are unremarkable. IMPRESSION: Negative one-view chest x-ray Electronically Signed   By: Marin Robertshristopher  Mattern M.D.   On: 01/14/2018 15:10     Assessment/Plan: Staph aureus wound infection L4-5 fusion with screws, rods, cage, bone graft 01-13-18 Anemia, Hepatitis C Chronic narcotic use Transfusion reaction (fev er, decreased BP).  Borderline  troponin  Total days of antibiotics: 2 vanco/rifampin  Await sensi on her S aureus, still pending today Continue vanco/rifampin Await Hep C RNA and genotype (she has not been treated previously) Her fever persists today (post transfusion reaction?), hopefully on the downward trend Agee with holding on pic til afebrile Family wants to care for her at home (as opposed to SNF) WBC stable          Johny SaxJeffrey Harutyun Monteverde MD, FACP Infectious Diseases (pager) [phone] [email or web address] 01/16/2018, 11:45 AM  LOS: 3 days

## 2018-01-16 NOTE — Care Management Note (Signed)
Case Management Note  Patient Details  Name: [name] MRN: [id number] Date of Birth: April 17, 1956  Subjective/Objective:   62yo female with history of LBP and radicular symptoms progressive in nature and who failed conservative treatments. She received a L4-L5 transforaminal interbody fusion on 01/13/18.  PTA, pt resided at home with mother.                   Action/Plan: Met with pt and daughter to discuss dc plans.  Pt states she lives with her mother and helps take care of her.  ID MD consulted on pt today, and has recommended home IV antibiotics for staph aureus wound infection.  Awaiting sensitivities on S. Aureus.  Notified [place] IV infusion coordinator of need for home IV antibiotics at dc.  Pt states she has 2 nurses in the family who can assist with infusions.  Pt has no insurance, so may need evaluation for [place] program through [facility] and LOG for IV antibiotics.  Will investigate.  Will need orders for [facility], PT, and OT., RW, and 3 in 1.  Expected Discharge Date:                  Expected Discharge Plan:  [place]  In-House Referral:     Discharge planning Services  CM Consult, [facility] Program  Post Acute Care Choice:  Durable Medical Equipment Choice offered to:  Patient  DME Arranged:  3-N-1, Walker rolling, IV pump/equipment DME Agency:  [facility] Arranged:  RN, PT, OT, IV Antibiotics HH Agency:     Status of Service:  In process, will continue to follow  If discussed at Long Length of Stay Meetings, dates discussed:    Additional Comments:  Ella Bodo, RN 01/16/2018, 3:17 PM

## 2018-01-16 NOTE — Progress Notes (Signed)
Physical Therapy Treatment Patient Details Name: Linda SizerCheryl K Hoover MRN: [id number] DOB: 1956-01-19 Today's Date: 01/16/2018    History of Present Illness 62yo female with history of LBP and radicular symptoms progressive in nature and who failed conservative treatments. She received a L4-L5 transforaminal interbody fusion on 01/13/18. PMH hepatitis C, OA, carpal tunnel syndrome, hx bursitis, hx seizures     PT Comments    Pt  Progressing steadily, but still has not internalized a lot of the education because she has not been able to focus.  Pt to be here a while longer due to infection.  Follow Up Recommendations  Home health PT;Supervision/Assistance - 24 hour     Equipment Recommendations  Rolling walker with 5" wheels;Other (comment)(TBA further)    Recommendations for Other Services       Precautions / Restrictions Precautions Precautions: Back Precaution Comments: Pt able to recall 1/3 back precautions  Required Braces or Orthoses: Spinal Brace Spinal Brace: Lumbar corset;Applied in sitting position    Mobility  Bed Mobility Overal bed mobility: Needs Assistance Bed Mobility: Rolling;Sidelying to Sit Rolling: Min assist Sidelying to sit: Min assist       General bed mobility comments: cues for sequence, minimal support and guarding  Transfers Overall transfer level: Needs assistance Equipment used: Rolling walker (2 wheeled) Transfers: Sit to/from Stand Sit to Stand: Min assist         General transfer comment: cues for hand placement  Ambulation/Gait Ambulation/Gait assistance: Min guard Ambulation Distance (Feet): 150 Feet Assistive device: Rolling walker (2 wheeled) Gait Pattern/deviations: Step-through pattern Gait velocity: able to speed up appreaciably, but unable to sustain or do so with light use of the RW Gait velocity interpretation: at or above normal speed for age/gender General Gait Details: steady, but a wanderer, mostly due to loss of  focus.   Stairs            Wheelchair Mobility    Modified Rankin (Stroke Patients Only)       Balance Overall balance assessment: Needs assistance   Sitting balance-Leahy Scale: Fair       Standing balance-Leahy Scale: Poor Standing balance comment: reliance on the RW                            Cognition Arousal/Alertness: Awake/alert Behavior During Therapy: WFL for tasks assessed/performed Overall Cognitive Status: Within Functional Limits for tasks assessed                                        Exercises      General Comments General comments (skin integrity, edema, etc.): reinforced back prec, progression of activity and emphasis on donning the brace.      Pertinent Vitals/Pain Pain Assessment: Faces Faces Pain Scale: Hurts little more Pain Location: back  Pain Descriptors / Indicators: Operative site guarding Pain Intervention(s): Monitored during session    Home Living                      Prior Function            PT Goals (current goals can now be found in the care plan section) Acute Rehab PT Goals Patient Stated Goal: to get better and go home  PT Goal Formulation: With patient/family Time For Goal Achievement: 01/28/18 Potential to Achieve Goals: Fair Progress towards PT goals:  Progressing toward goals    Frequency    Min 5X/week      PT Plan Current plan remains appropriate    Co-evaluation              AM-PAC PT "6 Clicks" Daily Activity  Outcome Measure  Difficulty turning over in bed (including adjusting bedclothes, sheets and blankets)?: Unable Difficulty moving from lying on back to sitting on the side of the bed? : Unable Difficulty sitting down on and standing up from a chair with arms (e.g., wheelchair, bedside commode, etc,.)?: Unable Help needed moving to and from a bed to chair (including a wheelchair)?: A Little Help needed walking in hospital room?: A Little Help  needed climbing 3-5 steps with a railing? : A Little 6 Click Score: 12    End of Session Equipment Utilized During Treatment: Back brace Activity Tolerance: Patient tolerated treatment well Patient left: in chair;with call bell/phone within reach;with family/visitor present Nurse Communication: Mobility status PT Visit Diagnosis: Other abnormalities of gait and mobility (R26.89);Pain Pain - part of body: (back)     Time: 1610-9604 PT Time Calculation (min) (ACUTE ONLY): 33 min  Charges:  $Gait Training: 8-22 mins $Therapeutic Activity: 8-22 mins                    G Codes:       2018-01-19  [name], PT [phone]  (pager)   [name] 01-19-18, 10:57 AM

## 2018-01-16 NOTE — Consult Note (Signed)
Ref: Orpah CobbKadakia, [name], MD   Subjective:  Awake. T Max 101.6. Hgb and WBC count levels are holding. Foley is out.  Objective:  Vital Signs in the last 24 hours: Temp:  [99.2 F (37.3 C)-101.6 F (38.7 C)] 99.4 F (37.4 C) (04/05 0827) Pulse Rate:  [66-82] 73 (04/05 0800) Cardiac Rhythm: Normal sinus rhythm (04/05 0900) Resp:  [15-22] 22 (04/05 0800) BP: (95-121)/(54-89) 114/65 (04/05 0800) SpO2:  [91 %-96 %] 94 % (04/05 0800)  Physical Exam: BP Readings from Last 1 Encounters:  01/16/18 114/65     Wt Readings from Last 1 Encounters:  01/13/18 60.3 kg (133 lb)    Weight change:  Body mass index is 25.13 kg/m. HEENT: [place]/AT, Eyes-Blue, PERL, EOMI, Conjunctiva-Pink, Sclera-Non-icteric Neck: No JVD, No bruit, Trachea midline. Lungs:  Clear, Bilateral. Cardiac:  Regular rhythm, normal S1 and S2, no S3. II/VI systolic murmur. Abdomen:  Soft, non-tender. BS present. Extremities:  No edema present. No cyanosis. No clubbing. Dressing over lumbar spine. CNS: AxOx3, Cranial nerves grossly intact, moves all 4 extremities.  Skin: Warm and dry.   Intake/Output from previous day: 04/04 0701 - 04/05 0700 In: 1619 [P.O.:590; I.V.:1029] Out: 3100 [Urine:3100]    Lab Results: BMET    Component Value Date/Time   NA 140 01/16/2018 0751   NA 140 01/15/2018 0503   NA 139 01/13/2018 2210   K 4.1 01/16/2018 0751   K 3.8 01/15/2018 0503   K 3.6 01/13/2018 2210   CL 107 01/16/2018 0751   CL 105 01/15/2018 0503   CL 108 01/13/2018 2210   CO2 27 01/16/2018 0751   CO2 28 01/15/2018 0503   CO2 22 01/13/2018 2210   GLUCOSE 140 (H) 01/16/2018 0751   GLUCOSE 123 (H) 01/15/2018 0503   GLUCOSE 190 (H) 01/13/2018 2210   BUN <5 (L) 01/16/2018 0751   BUN <5 (L) 01/15/2018 0503   BUN 8 01/13/2018 2210   CREATININE 0.63 01/16/2018 0751   CREATININE 0.77 01/15/2018 0503   CREATININE 0.81 01/13/2018 2210   CREATININE 0.75 12/26/2017 1258   CALCIUM 8.1 (L) 01/16/2018 0751   CALCIUM 7.7 (L)  01/15/2018 0503   CALCIUM 7.8 (L) 01/13/2018 2210   GFRNONAA >60 01/16/2018 0751   GFRNONAA >60 01/15/2018 0503   GFRNONAA >60 01/13/2018 2210   GFRAA >60 01/16/2018 0751   GFRAA >60 01/15/2018 0503   GFRAA >60 01/13/2018 2210   CBC    Component Value Date/Time   WBC 10.2 01/15/2018 2020   RBC 3.75 (L) 01/15/2018 2020   HGB 11.0 (L) 01/15/2018 2020   HCT 35.0 (L) 01/15/2018 2020   PLT 257 01/15/2018 2020   MCV 93.3 01/15/2018 2020   MCH 29.3 01/15/2018 2020   MCHC 31.4 01/15/2018 2020   RDW 15.5 01/15/2018 2020   LYMPHSABS 3.4 01/15/2018 2020   MONOABS 1.3 (H) 01/15/2018 2020   EOSABS 0.4 01/15/2018 2020   BASOSABS 0.0 01/15/2018 2020   HEPATIC Function Panel Recent Labs    01/13/18 0701 01/15/18 0503 01/16/18 0751  PROT 6.6 4.6* 4.8*   HEMOGLOBIN A1C No components found for: HGA1C,  MPG CARDIAC ENZYMES Lab Results  Component Value Date   CKTOTAL 11 11/13/2009   CKMB 0.7 11/13/2009   TROPONINI 0.03 (HH) 01/15/2018   TROPONINI 0.03 (HH) 01/14/2018   TROPONINI <0.03 06/24/2016   BNP No results for input(s): PROBNP in the last 8760 hours. TSH No results for input(s): TSH in the last 8760 hours. CHOLESTEROL No results for input(s): CHOL in  the last 8760 hours.  Scheduled Meds: . aspirin EC  81 mg Oral Daily  . docusate sodium  100 mg Oral BID  . feeding supplement (ENSURE ENLIVE)  237 mL Oral BID BM  . furosemide  20 mg Intravenous Once  . gabapentin  300 mg Oral TID  . metoprolol tartrate  12.5 mg Oral BID  . oxyCODONE  30 mg Oral Q12H  . pantoprazole  40 mg Oral Daily  . polyethylene glycol  17 g Oral Daily  . pravastatin  10 mg Oral q1800  . rifampin  300 mg Oral Daily  . sodium chloride flush  3 mL Intravenous Q12H   Continuous Infusions: . sodium chloride Stopped (01/15/18 0830)  . sodium chloride 125 mL/hr at 01/16/18 0306  . sodium chloride    . vancomycin Stopped (01/15/18 1932)   PRN Meds:.alum & mag hydroxide-simeth, bisacodyl, ibuprofen,  LORazepam, menthol-cetylpyridinium **OR** phenol, morphine injection, nitroGLYCERIN, ondansetron **OR** ondansetron (ZOFRAN) IV, oxyCODONE, polyethylene glycol, sodium chloride flush, sodium phosphate  Assessment/Plan: Chest pain, resolved Tropnine I- from demand ischemia Anemia of blood loss- stable Fever Staph aureus bacteremia Hepatitis C CAD Tobacco use disorder  Continue medical treatment   LOS: 3 days    Orpah Cobb  MD  01/16/2018, 10:02 AM

## 2018-01-16 NOTE — Progress Notes (Signed)
Initial Nutrition Assessment  DOCUMENTATION CODES:  Not applicable  INTERVENTION:  D/C ensure (refusing/cant tolerate)  Start Magic cup TID with meals, each supplement provides 290 kcal and 9 grams of protein  RD ordered patient meal requests for today's lunch  Recommend diet liberalization to REGULAR for time being, items she desires are not allowed w/ current diet restrictions  Etiology of poor intake is reported to be d/t severe anxiety and being "overwhelmed" by current health obstacles  NUTRITION DIAGNOSIS:  Inadequate oral intake related to poor appetite, nausea(Anxiety, mentally overwhelmed) as evidenced by meal completion < 25%, per patient/family report.  GOAL:  Patient will meet greater than or equal to 90% of their needs  MONITOR:  PO intake, Supplement acceptance, Diet advancement, Labs, Weight trends  REASON FOR ASSESSMENT:  Consult, Malnutrition Screening Tool Assessment of nutrition requirement/status  ASSESSMENT:  62 y/o female PMHx HLD, GERD, Hep C, CAD, Anxiety. Presented for elective lumbar fusion due to low back pain and lower extremity radiculopathy. Post op course complicated by wound culture positive for staph aureus and chest pain w/ minimally elevated troponin. .   RD consulted for assessment in light of poor total protein levels. Albumin 2.3  Intake records show the patient has been eating poorly (0-10%) since surgery. She has a 100% intake documented for dinner yesterday, that was brought from outside by family. Patient today says this meal was just "a slider" from a fast food restaurant.   RD attempted to determine why intake has been poor. She says "just everything that is going on, the infection..". She is overwhelmed by her state and starts getting emotional when she talks about her infection and them "not knowing what its from". She sounds to have severe anxiety regarding situation. She asserts "it is not the food", rather she just has no desire to  eat. She reports intermittent nausea.  She endorses weight loss, mainly occurring prior to surgery. She says she lost 11 lbs preop. She notes a UBW of 146. She is now 133. There is no recent objective weight history in chart. Last weight from May 2018 was 150 lbs, but this is suspected to have been reported, as opposed to measured..  RD reiterated how postop nutrition is essential for wound healing and for fighting infection. RD offered supplements, such as Ensure/Boost. She says she is unable to tolerate these. RD described magic cups as an alternative. She was agreeable, stating "ill try anything once". RD also took her meal requests. She has not been able to order certain items she is interested in due to sodium restriction. RD gave ambassador permission to order lunch patient requested. Would recommend diet liberalization to Regular for the time being, while intake is poor.   Physical Exam: Deferred due to patients reported pain with essentially any movement.   Labs: Albumin: 2.3, Glu:140 Medications: Colace, Lasix, Ensure Enlive (refusing), Miralax, IV abx, PO ibx. IVF, IV vanc  Recent Labs  Lab 01/13/18 2210 01/15/18 0503 01/16/18 0751  NA 139 140 140  K 3.6 3.8 4.1  CL 108 105 107  CO2 22 28 27   BUN 8 <5* <5*  CREATININE 0.81 0.77 0.63  CALCIUM 7.8* 7.7* 8.1*  GLUCOSE 190* 123* 140*   NUTRITION - FOCUSED PHYSICAL EXAM: Deferred  Diet Order:  Precautions:  No bending, arching, or twisting Diet 2 gram sodium Room service appropriate? Yes; Fluid consistency: Thin  EDUCATION NEEDS:  No education needs have been identified at this time  Skin:Surgical incision to Back  Last BM:  4/1  Height:  Ht Readings from Last 1 Encounters:  01/13/18 5\' 1"  (1.549 m)   Weight:  Wt Readings from Last 1 Encounters:  01/13/18 133 lb (60.3 kg)   Wt Readings from Last 10 Encounters:  01/13/18 133 lb (60.3 kg)  12/28/17 133 lb (60.3 kg)  12/26/17 133 lb (60.3 kg)  02/12/17 150 lb (68  kg)  06/24/16 144 lb 4.8 oz (65.5 kg)  09/19/15 138 lb (62.6 kg)  01/02/12 168 lb 12.8 oz (76.6 kg)  11/21/11 158 lb (71.7 kg)  11/19/11 158 lb 9.6 oz (71.9 kg)   Ideal Body Weight:  47.73 kg  BMI:  Body mass index is 25.13 kg/m.  Estimated Nutritional Needs:  Kcal:  1700-1850 kcals (28-31 kcal/kg bw) Protein:  80-90g Pro (1.3-1.5 g/kg bw) Fluid:  >1.8 L (30 ml/kg bw)  Christophe Louis RD, LDN, CNSC Clinical Nutrition Available Tues-Sat via Pager: [phone] 01/16/2018 11:27 AM

## 2018-01-17 ENCOUNTER — Inpatient Hospital Stay (HOSPITAL_COMMUNITY): Payer: Self-pay

## 2018-01-17 DIAGNOSIS — G0491 Myelitis, unspecified: Secondary | ICD-10-CM

## 2018-01-17 DIAGNOSIS — R509 Fever, unspecified: Secondary | ICD-10-CM

## 2018-01-17 LAB — RHEUMATOID FACTOR: RHEUMATOID FACTOR: 10.4 [IU]/mL (ref 0.0–13.9)

## 2018-01-17 LAB — VANCOMYCIN, TROUGH: Vancomycin Tr: 5 ug/mL — ABNORMAL LOW (ref 15–20)

## 2018-01-17 MED ORDER — BISACODYL 10 MG RE SUPP
10.0000 mg | Freq: Once | RECTAL | Status: AC
  Administered 2018-01-17: 10 mg via RECTAL
  Filled 2018-01-17: qty 1

## 2018-01-17 MED ORDER — VANCOMYCIN HCL IN DEXTROSE 750-5 MG/150ML-% IV SOLN
750.0000 mg | Freq: Two times a day (BID) | INTRAVENOUS | Status: DC
  Administered 2018-01-18 – 2018-01-19 (×3): 750 mg via INTRAVENOUS
  Filled 2018-01-17 (×4): qty 150

## 2018-01-17 NOTE — Progress Notes (Signed)
INFECTIOUS DISEASE PROGRESS NOTE  ID: [name] is a 62 y.o. female with  Principal Problem:   Anemia due to blood loss, acute Active Problems:   Spinal stenosis of lumbar region   Spondylolisthesis, lumbar region   Fusion of lumbar spine   Infection of lumbar spine (HCC)  Subjective: No complaints  Abtx:  Anti-infectives (From admission, onward)   Start     Dose/Rate Route Frequency Ordered Stop   01/14/18 1715  rifampin (RIFADIN) capsule 300 mg     300 mg Oral Daily 01/14/18 1713     01/14/18 1600  vancomycin (VANCOCIN) 1,250 mg in sodium chloride 0.9 % 250 mL IVPB     1,250 mg 166.7 mL/hr over 90 Minutes Intravenous Every 24 hours 01/14/18 1510     01/13/18 1630  ceFAZolin (ANCEF) IVPB 1 g/50 mL premix     1 g 100 mL/hr over 30 Minutes Intravenous Every 8 hours 01/13/18 1610 01/14/18 0559   01/13/18 0543  ceFAZolin (ANCEF) IVPB 2g/100 mL premix     2 g 200 mL/hr over 30 Minutes Intravenous On call to O.R. 01/13/18 0543 01/13/18 0745      Medications:  Scheduled: . aspirin EC  81 mg Oral Daily  . docusate sodium  100 mg Oral BID  . furosemide  20 mg Intravenous Once  . gabapentin  300 mg Oral TID  . metoprolol tartrate  12.5 mg Oral BID  . oxyCODONE  30 mg Oral Q12H  . pantoprazole  40 mg Oral Daily  . polyethylene glycol  17 g Oral Daily  . pravastatin  10 mg Oral q1800  . rifampin  300 mg Oral Daily  . sodium chloride flush  3 mL Intravenous Q12H    Objective: Vital signs in last 24 hours: Temp:  [99.1 F (37.3 C)-101.6 F (38.7 C)] 101.1 F (38.4 C) (04/06 1200) Pulse Rate:  [69-78] 69 (04/06 0825) Resp:  [13-26] 17 (04/06 0825) BP: (94-129)/(53-66) 108/57 (04/06 0825) SpO2:  [91 %-98 %] 98 % (04/06 0825)   General appearance: alert, cooperative and no distress Resp: clear to auscultation bilaterally Cardio: regular rate and rhythm GI: normal findings: bowel sounds normal and soft, non-tender  Lab Results Recent Labs    01/15/18 0503  01/15/18 2020 01/16/18 0751  WBC 10.0 10.2  --   HGB 11.2* 11.0*  --   HCT 34.4* 35.0*  --   NA 140  --  140  K 3.8  --  4.1  CL 105  --  107  CO2 28  --  27  BUN <5*  --  <5*  CREATININE 0.77  --  0.63   Liver Panel Recent Labs    01/15/18 0503 01/16/18 0751  PROT 4.6* 4.8*  ALBUMIN 2.4* 2.3*  AST 19 26  ALT 9* 16  ALKPHOS 56 76  BILITOT 0.9 0.8   Sedimentation Rate No results for input(s): ESRSEDRATE in the last 72 hours. C-Reactive Protein No results for input(s): CRP in the last 72 hours.  Microbiology: Recent Results (from the past 240 hour(s))  Surgical pcr screen     Status: None   Collection Time: 01/13/18  5:42 AM  Result Value Ref Range Status   MRSA, PCR NEGATIVE NEGATIVE Final   Staphylococcus aureus NEGATIVE NEGATIVE Final    Comment: (NOTE) The Xpert SA Assay (FDA approved for NASAL specimens in patients 62 years of age and older), is one component of a comprehensive surveillance program. It is not intended  to diagnose infection nor to guide or monitor treatment. Performed at [facility], [address] [place], [place] [address]   Aerobic/Anaerobic Culture (surgical/deep wound)     Status: None (Preliminary result)   Collection Time: 01/13/18 10:28 AM  Result Value Ref Range Status   Specimen Description WOUND BACK CYSTIC AREA L5 S1 FACET  Final   Special Requests SPEC A ON SWABS  Final   Gram Stain   Final    FEW WBC PRESENT,BOTH PMN AND MONONUCLEAR NO ORGANISMS SEEN    Culture   Final    FEW STAPHYLOCOCCUS AUREUS Sent to [facility] for further susceptibility testing. RESULT CALLED TO, READ BACK BY AND VERIFIED WITH: DR Theora Vankirk AT 1030 01/17/18 BY L BENFIELD CONCERNING DELAY IN RESULTS Performed at [facility], [address], [place], [place] [address]    Report Status PENDING  Incomplete  Acid Fast Smear (AFB)     Status: None   Collection Time: 01/13/18 10:28 AM  Result Value Ref Range Status   AFB Specimen Processing  Concentration  Final   Acid Fast Smear Negative  Final    Comment: (NOTE) Performed At: [facility] [address] [place], [place] [id number] Jolene Schimke MD WG:[id number]    Source (AFB) WOUND  Final    Comment: BACK CYSTIC AREA L5 S1 FACET Performed at [facility], [address], [place], [place] [address]     Studies/Results: US Renal  Result Date: 01/16/2018 CLINICAL DATA:  Left renal cyst EXAM: RENAL / URINARY TRACT ULTRASOUND COMPLETE COMPARISON:  CT abdomen and pelvis October 12, 2013 FINDINGS: Right Kidney: Length: 11.1 cm. Echogenicity and renal cortical thickness are within normal limits. No mass, perinephric fluid, or hydronephrosis visualized. No sonographically demonstrable calculus or ureterectasis. Left Kidney: Length: 10.8 cm. Echogenicity and renal cortical thickness are within normal limits. No perinephric fluid or hydronephrosis visualized. There is a cyst arising at the upper to mid portion of the left kidney measuring 2.5 x 2.6 x 2.6 cm. No other mass evident. No sonographically demonstrable calculus or ureterectasis. Bladder: Appears normal for degree of bladder distention. IMPRESSION: Cyst in left kidney, marginally larger than on 2014 study. Study otherwise unremarkable. Electronically Signed   By: Bretta Bang III M.D.   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  Borderline troponin  Total days of antibiotics:3 vanco/rifampin  Spoke with [facility] and [facility] lab. Her sensi were sent yesterday.  Will be 4-8 before returned, at earliest.  Continues to have fever She does not appear toxic Will check LE doppler Hepatitis labs pending Will stop rifampin as possible cause of fever?         Johny Sax MD, FACP Infectious Diseases (pager) [phone] [email or web address] 01/17/2018, 12:04 PM  LOS: 4 days

## 2018-01-17 NOTE — Progress Notes (Signed)
Motrin given for rectal temp 101.1

## 2018-01-17 NOTE — Progress Notes (Signed)
   Subjective:  Patient reports pain as mild.  Walking around well.  Using bathroom fine.  Objective:   VITALS:   Vitals:   01/17/18 0000 01/17/18 0001 01/17/18 0400 01/17/18 0500  BP: 129/66  (!) 94/53   Pulse: 78  72   Resp: (!) 26  13   Temp:  (!) 100.6 F (38.1 C)  99.8 F (37.7 C)  TempSrc:  Rectal  Rectal  SpO2: 91%  96%   Weight:      Height:        Neurologically intact Neurovascular intact Sensation intact distally Intact pulses distally Dorsiflexion/Plantar flexion intact Incision: dressing C/D/I and no drainage No cellulitis present   Lab Results  Component Value Date   WBC 10.2 01/15/2018   HGB 11.0 (L) 01/15/2018   HCT 35.0 (L) 01/15/2018   MCV 93.3 01/15/2018   PLT 257 01/15/2018     Assessment/Plan:  4 Days Post-Op   - DVT ppx - SCDs, ambulation, aspirin - UAT - still having low grade fevers - continue abx  Glee ArvinMichael Kimimila Tauzin 01/17/2018, 8:00 AM [phone]

## 2018-01-17 NOTE — Progress Notes (Signed)
Pharmacy Antibiotic Note  [name] is a 62 y.o. female with staph arueus wound infection   Pharmacy has been consulted for vancomycin dosing. Culture sensitivities pending -WBC= 10.5, tmax= 101.1, CrCl ~ 60 -vancomycin level= 5 (~ 4pm; lst dose given at ~ 4pm on 4/5)  Plan: -Vancomycin 1240m IV x1 then  vancomycin to 7587mIV q12h -Will follow renal function, cultures and clinical progress    Height: 5' 1" (154.9 cm) Weight: 133 lb (60.3 kg) IBW/kg (Calculated) : 47.8  Temp (24hrs), Avg:100.1 F (37.8 C), Min:99.1 F (37.3 C), Max:101.1 F (38.4 C)  Recent Labs  Lab 01/13/18 0701 01/13/18 2210 01/14/18 1204 01/15/18 0503 01/15/18 2020 01/16/18 0751 01/17/18 1446  WBC 9.7 11.7* 11.2* 10.0 10.2  --   --   CREATININE 0.81 0.81  --  0.77  --  0.63  --   VANCOTROUGH  --   --   --   --   --   --  5*    Estimated Creatinine Clearance: 60.8 mL/min (by C-G formula based on SCr of 0.63 mg/dL).    Allergies  Allergen Reactions  . Acetaminophen-Codeine Other (See Comments)    Hand get red, feels like it's on fire  . Morphine And Related     "makes me feel like i'm on fire"  . Azithromycin Itching and Rash      Thank you for allowing pharmacy to be a part of this patient's care.  AnHildred LaserPharmD Clinical Pharmacist 01/17/2018 4:30 PM

## 2018-01-17 NOTE — Consult Note (Signed)
Ref: Orpah CobbKadakia, Elyas Villamor, MD   Subjective:  T max 101.6 degree F. Feeling overall better and ambulating some.   Objective:  Vital Signs in the last 24 hours: Temp:  [99.1 F (37.3 C)-101.6 F (38.7 C)] 99.1 F (37.3 C) (04/06 0825) Pulse Rate:  [69-78] 69 (04/06 0825) Cardiac Rhythm: Normal sinus rhythm (04/06 0800) Resp:  [13-26] 17 (04/06 0825) BP: (94-132)/(53-74) 108/57 (04/06 0825) SpO2:  [90 %-98 %] 98 % (04/06 0825)  Physical Exam: BP Readings from Last 1 Encounters:  01/17/18 (!) 108/57     Wt Readings from Last 1 Encounters:  01/13/18 60.3 kg (133 lb)    Weight change:  Body mass index is 25.13 kg/m. HEENT: [place]/AT, Eyes-Blue, PERL, EOMI, Conjunctiva-Pink, Sclera-Non-icteric Neck: No JVD, No bruit, Trachea midline. Lungs:  Clear, Bilateral. Cardiac:  Regular rhythm, normal S1 and S2, no S3. II/VI systolic murmur. Abdomen:  Soft, non-tender. BS present. Extremities:  No edema present. No cyanosis. No clubbing. Dressing over lumbar spine, CNS: AxOx3, Cranial nerves grossly intact, moves all 4 extremities.  Skin: Warm and dry.   Intake/Output from previous day: 04/05 0701 - 04/06 0700 In: 1570 [P.O.:1320; IV Piggyback:250] Out: -     Lab Results: BMET    Component Value Date/Time   NA 140 01/16/2018 0751   NA 140 01/15/2018 0503   NA 139 01/13/2018 2210   K 4.1 01/16/2018 0751   K 3.8 01/15/2018 0503   K 3.6 01/13/2018 2210   CL 107 01/16/2018 0751   CL 105 01/15/2018 0503   CL 108 01/13/2018 2210   CO2 27 01/16/2018 0751   CO2 28 01/15/2018 0503   CO2 22 01/13/2018 2210   GLUCOSE 140 (H) 01/16/2018 0751   GLUCOSE 123 (H) 01/15/2018 0503   GLUCOSE 190 (H) 01/13/2018 2210   BUN <5 (L) 01/16/2018 0751   BUN <5 (L) 01/15/2018 0503   BUN 8 01/13/2018 2210   CREATININE 0.63 01/16/2018 0751   CREATININE 0.77 01/15/2018 0503   CREATININE 0.81 01/13/2018 2210   CREATININE 0.75 12/26/2017 1258   CALCIUM 8.1 (L) 01/16/2018 0751   CALCIUM 7.7 (L) 01/15/2018  0503   CALCIUM 7.8 (L) 01/13/2018 2210   GFRNONAA >60 01/16/2018 0751   GFRNONAA >60 01/15/2018 0503   GFRNONAA >60 01/13/2018 2210   GFRAA >60 01/16/2018 0751   GFRAA >60 01/15/2018 0503   GFRAA >60 01/13/2018 2210   CBC    Component Value Date/Time   WBC 10.2 01/15/2018 2020   RBC 3.75 (L) 01/15/2018 2020   HGB 11.0 (L) 01/15/2018 2020   HCT 35.0 (L) 01/15/2018 2020   PLT 257 01/15/2018 2020   MCV 93.3 01/15/2018 2020   MCH 29.3 01/15/2018 2020   MCHC 31.4 01/15/2018 2020   RDW 15.5 01/15/2018 2020   LYMPHSABS 3.4 01/15/2018 2020   MONOABS 1.3 (H) 01/15/2018 2020   EOSABS 0.4 01/15/2018 2020   BASOSABS 0.0 01/15/2018 2020   HEPATIC Function Panel Recent Labs    01/13/18 0701 01/15/18 0503 01/16/18 0751  PROT 6.6 4.6* 4.8*   HEMOGLOBIN A1C No components found for: HGA1C,  MPG CARDIAC ENZYMES Lab Results  Component Value Date   CKTOTAL 11 11/13/2009   CKMB 0.7 11/13/2009   TROPONINI 0.03 (HH) 01/15/2018   TROPONINI 0.03 (HH) 01/14/2018   TROPONINI <0.03 06/24/2016   BNP No results for input(s): PROBNP in the last 8760 hours. TSH No results for input(s): TSH in the last 8760 hours. CHOLESTEROL No results for input(s): CHOL in  the last 8760 hours.  Scheduled Meds: . aspirin EC  81 mg Oral Daily  . docusate sodium  100 mg Oral BID  . furosemide  20 mg Intravenous Once  . gabapentin  300 mg Oral TID  . metoprolol tartrate  12.5 mg Oral BID  . oxyCODONE  30 mg Oral Q12H  . pantoprazole  40 mg Oral Daily  . polyethylene glycol  17 g Oral Daily  . pravastatin  10 mg Oral q1800  . rifampin  300 mg Oral Daily  . sodium chloride flush  3 mL Intravenous Q12H   Continuous Infusions: . sodium chloride Stopped (01/15/18 0830)  . sodium chloride 125 mL/hr at 01/16/18 2150  . sodium chloride    . vancomycin Stopped (01/16/18 1832)   PRN Meds:.alum & mag hydroxide-simeth, bisacodyl, ibuprofen, LORazepam, menthol-cetylpyridinium **OR** phenol, morphine injection,  nitroGLYCERIN, ondansetron **OR** ondansetron (ZOFRAN) IV, oxyCODONE, polyethylene glycol, sodium chloride flush, sodium phosphate  Assessment/Plan: Chest pain Abnormal troponin-I from demand ischemia Anemia of acute blood loss S/P lumbar spine surgery for stenosis Lumbar spine staph aureus infection CAD Hepatitis C Tobacco use disorder  Awaiting PICC line when afebrile for 24 hours. Increase activity as tiolerated. IV antibiotics per infectious disease May need TEE if fever persist.   LOS: 4 days    Orpah Cobb  MD  01/17/2018, 10:56 AM

## 2018-01-17 NOTE — Progress Notes (Signed)
Physical Therapy Treatment Patient Details Name: [name] MRN: [id number] DOB: [birth year] Today's Date: 01/17/2018    History of Present Illness 62yo female with history of LBP and radicular symptoms progressive in nature and who failed conservative treatments. She received a L4-L5 transforaminal interbody fusion on 01/13/18. PMH hepatitis C, OA, carpal tunnel syndrome, hx bursitis, hx seizures     PT Comments    Pt progressing well. Pt much improved from yesterday and was able to complete stair negotiation. Pt remains to report 8/10 back pain. Acute PT to con't to follow.   Follow Up Recommendations  Home health PT;Supervision/Assistance - 24 hour     Equipment Recommendations  Rolling walker with 5" wheels;Other (comment);3in1 (PT)    Recommendations for Other Services       Precautions / Restrictions Precautions Precautions: Back Precaution Booklet Issued: Yes (comment) Precaution Comments: pt able to recall 3/3 back precautions Required Braces or Orthoses: Spinal Brace Spinal Brace: Lumbar corset;Applied in sitting position Restrictions Weight Bearing Restrictions: No Other Position/Activity Restrictions: per MD order, patient may ambulate without brace     Mobility  Bed Mobility Overal bed mobility: Needs Assistance Bed Mobility: Rolling;Sidelying to Sit;Sit to Sidelying Rolling: Supervision Sidelying to sit: Supervision     Sit to sidelying: Supervision General bed mobility comments: cues for sequence, minimal support and guarding  Transfers Overall transfer level: Needs assistance Equipment used: Rolling walker (2 wheeled) Transfers: Sit to/from Stand Sit to Stand: Supervision         General transfer comment: pt with good technique, pushed up from bed  Ambulation/Gait Ambulation/Gait assistance: Supervision Ambulation Distance (Feet): 225 Feet Assistive device: Rolling walker (2 wheeled) Gait Pattern/deviations: Step-through pattern     General  Gait Details: steady, less UE WBing , no episode of LOB   Stairs Stairs: Yes   Stair Management: One rail Left;Step to pattern;Forwards Number of Stairs: 5 General stair comments: v/c's for squencing up with the good, down with the bad..   Wheelchair Mobility    Modified Rankin (Stroke Patients Only)       Balance Overall balance assessment: Needs assistance Sitting-balance support: No upper extremity supported Sitting balance-Leahy Scale: Good     Standing balance support: During functional activity Standing balance-Leahy Scale: Fair Standing balance comment: relys on RW for safe amb                            Cognition Arousal/Alertness: Awake/alert Behavior During Therapy: WFL for tasks assessed/performed Overall Cognitive Status: Within Functional Limits for tasks assessed                                        Exercises      General Comments        Pertinent Vitals/Pain Pain Assessment: 0-10 Pain Score: 8  Pain Location: back Pain Descriptors / Indicators: Operative site guarding Pain Intervention(s): Monitored during session    Home Living                      Prior Function            PT Goals (current goals can now be found in the care plan section) Progress towards PT goals: Progressing toward goals    Frequency    Min 5X/week      PT Plan Current plan remains appropriate  Co-evaluation              AM-PAC PT "6 Clicks" Daily Activity  Outcome Measure  Difficulty turning over in bed (including adjusting bedclothes, sheets and blankets)?: None Difficulty moving from lying on back to sitting on the side of the bed? : None Difficulty sitting down on and standing up from a chair with arms (e.g., wheelchair, bedside commode, etc,.)?: None Help needed moving to and from a bed to chair (including a wheelchair)?: A Little Help needed walking in hospital room?: A Little Help needed climbing 3-5  steps with a railing? : A Little 6 Click Score: 21    End of Session Equipment Utilized During Treatment: Back brace Activity Tolerance: Patient tolerated treatment well Patient left: in bed;with call bell/phone within reach;with family/visitor present Nurse Communication: Mobility status PT Visit Diagnosis: Other abnormalities of gait and mobility (R26.89);Pain     Time: 1610-9604 PT Time Calculation (min) (ACUTE ONLY): 20 min  Charges:  $Gait Training: 8-22 mins                    G Codes:       [name], PT, DPT Pager #: [phone] Office #: [phone]    [name] [name] 01/17/2018, 11:58 AM

## 2018-01-17 NOTE — Progress Notes (Signed)
VASCULAR LAB PRELIMINARY  PRELIMINARY  PRELIMINARY  PRELIMINARY  Bilateral lower extremity venous duplex completed.    Preliminary report:  There is no DVT or SVT noted in the bilateral lower extremities.   Taressa Rauh, RVT 01/17/2018, 2:44 PM

## 2018-01-18 ENCOUNTER — Inpatient Hospital Stay: Payer: Self-pay

## 2018-01-18 DIAGNOSIS — Z95828 Presence of other vascular implants and grafts: Secondary | ICD-10-CM

## 2018-01-18 DIAGNOSIS — T8089XA Other complications following infusion, transfusion and therapeutic injection, initial encounter: Secondary | ICD-10-CM

## 2018-01-18 DIAGNOSIS — D62 Acute posthemorrhagic anemia: Secondary | ICD-10-CM

## 2018-01-18 LAB — MRSA PCR SCREENING: MRSA by PCR: POSITIVE — AB

## 2018-01-18 MED ORDER — CHLORHEXIDINE GLUCONATE CLOTH 2 % EX PADS
6.0000 | MEDICATED_PAD | Freq: Every day | CUTANEOUS | Status: DC
  Administered 2018-01-19: 6 via TOPICAL

## 2018-01-18 MED ORDER — MUPIROCIN 2 % EX OINT
1.0000 "application " | TOPICAL_OINTMENT | Freq: Two times a day (BID) | CUTANEOUS | Status: DC
  Administered 2018-01-18 – 2018-01-19 (×2): 1 via NASAL
  Filled 2018-01-18: qty 22

## 2018-01-18 MED ORDER — SODIUM CHLORIDE 0.9% FLUSH
10.0000 mL | Freq: Two times a day (BID) | INTRAVENOUS | Status: DC
  Administered 2018-01-18: 10 mL

## 2018-01-18 MED ORDER — SODIUM CHLORIDE 0.9% FLUSH: 10.0000 mL | INTRAVENOUS | Status: DC | PRN

## 2018-01-18 NOTE — Progress Notes (Signed)
Peripherally Inserted Central Catheter/Midline Placement  The IV Nurse has discussed with the patient and/or persons authorized to consent for the patient, the purpose of this procedure and the potential benefits and risks involved with this procedure.  The benefits include less needle sticks, lab draws from the catheter, and the patient may be discharged home with the catheter. Risks include, but not limited to, infection, bleeding, blood clot (thrombus formation), and puncture of an artery; nerve damage and irregular heartbeat and possibility to perform a PICC exchange if needed/ordered by physician.  Alternatives to this procedure were also discussed.  Bard Power PICC patient education guide, fact sheet on infection prevention and patient information card has been provided to patient /or left at bedside.    Pt verbalizes nervousness, all questions answered after lengthy discussion about the procedure.  Dtr at bedside during discussion, also had all questions answered.  PICC/Midline Placement Documentation  PICC Single Lumen 01/18/18 PICC Right Basilic 35 cm 0 cm (Active)  Indication for Insertion or Continuance of Line Home intravenous therapies (PICC only) 01/18/2018  5:34 PM  Exposed Catheter (cm) 0 cm 01/18/2018  5:34 PM  Site Assessment Clean;Dry;Intact 01/18/2018  5:34 PM  Line Status Flushed;Saline locked;Blood return noted 01/18/2018  5:34 PM  Dressing Type Transparent 01/18/2018  5:34 PM  Dressing Status Clean;Dry;Intact;Antimicrobial disc in place 01/18/2018  5:34 PM  Line Care Connections checked and tightened 01/18/2018  5:34 PM  Line Adjustment (NICU/IV Team Only) No 01/18/2018  5:34 PM  Dressing Intervention New dressing 01/18/2018  5:34 PM  Dressing Change Due 01/25/18 01/18/2018  5:34 PM       Elliot Dallyiggs, Quita Mcgrory Wright 01/18/2018, 5:35 PM

## 2018-01-18 NOTE — Progress Notes (Addendum)
Telemetry called to alert Nurse that patient leads were not attached. Upon going into room patient was unattached from all chords. Patient told Nurse "the other Nurse unhooked me so that I could go to the bathroom". Patient then began complaining about night Nurse and stated "you are the only one who has noticed these things were off".  Nurse educated patient about diet restriction of 2 rams of sodium because during morning assessment Nurse threw away [employer] trash. Patient tried to say that the dietician told her she "can eat whatever she wants." Nurse again told patient why she is on a certain diet pertaining to PMH and cardiology being consulted for her care. Patient became angry, putting her hands in her ears as not to hear what Nurse was saying. Patient then called her daughter and told her to come to the hospital because she did not want me as her Nurse. Nurse tried to show patient the order in the computer and patient refused to look saying that she knows what she was told.   Crystal, RN came in the room before patient called her daughter and is a witness to event.   Daughter came to hospital in the middle of me giving report to Crystal, RN (who switched patients per "patient demand" and asked what happened. Daughter was told the same thing as in the note by both Crystal and myself.   Daughter stated "my mother is on a lot of drugs and has not been getting any sleep, I don't know why the ativan is not working but she has not gotten any rest. I apologize".   Daughter is at bedside.

## 2018-01-18 NOTE — Progress Notes (Signed)
1900: Handoff report received from RN. Pt resting in bed. Discussed plan of care for the shift; pt amenable to plan.  2000: Pt requesting pain medicines on a schedule "like they usually give them." I educated the pt on PRN pain medications, the pain score, and proper pain reporting.  0000: Pt resting comfortably.  0200: Assisted pt to bathroom; pt ambulated back to bed without assistance and reported a BM as charted.  0400: Pt continues resting comfortably.  0700: Handoff report given to RN. No acute events overnight.

## 2018-01-18 NOTE — Progress Notes (Addendum)
INFECTIOUS DISEASE PROGRESS NOTE  ID: [name] is a 62 y.o. female with  Principal Problem:   Anemia due to blood loss, acute Active Problems:   Spinal stenosis of lumbar region   Spondylolisthesis, lumbar region   Fusion of lumbar spine   Infection of lumbar spine (HCC)  Subjective: No complaints Depressed that she is still in hospital  Abtx:  Anti-infectives (From admission, onward)   Start     Dose/Rate Route Frequency Ordered Stop   01/18/18 1100  vancomycin (VANCOCIN) IVPB 750 mg/150 ml premix     750 mg 150 mL/hr over 60 Minutes Intravenous Every 12 hours 01/17/18 1634     01/14/18 1715  rifampin (RIFADIN) capsule 300 mg  Status:  Discontinued     300 mg Oral Daily 01/14/18 1713 01/17/18 1209   01/14/18 1600  vancomycin (VANCOCIN) 1,250 mg in sodium chloride 0.9 % 250 mL IVPB     1,250 mg 166.7 mL/hr over 90 Minutes Intravenous Every 24 hours 01/14/18 1510 01/17/18 1819   01/13/18 1630  ceFAZolin (ANCEF) IVPB 1 g/50 mL premix     1 g 100 mL/hr over 30 Minutes Intravenous Every 8 hours 01/13/18 1610 01/14/18 0559   01/13/18 0543  ceFAZolin (ANCEF) IVPB 2g/100 mL premix     2 g 200 mL/hr over 30 Minutes Intravenous On call to O.R. 01/13/18 0543 01/13/18 0745      Medications:  Scheduled: . aspirin EC  81 mg Oral Daily  . docusate sodium  100 mg Oral BID  . furosemide  20 mg Intravenous Once  . gabapentin  300 mg Oral TID  . metoprolol tartrate  12.5 mg Oral BID  . oxyCODONE  30 mg Oral Q12H  . pantoprazole  40 mg Oral Daily  . polyethylene glycol  17 g Oral Daily  . pravastatin  10 mg Oral q1800  . sodium chloride flush  3 mL Intravenous Q12H    Objective: Vital signs in last 24 hours: Temp:  [99.4 F (37.4 C)-101.2 F (38.4 C)] 99.8 F (37.7 C) (04/06 2300) Pulse Rate:  [74-84] 80 (04/07 0546) Resp:  [16-24] 19 (04/07 0546) BP: (92-110)/(58-72) 107/66 (04/07 0546) SpO2:  [92 %-99 %] 95 % (04/07 0546)   General appearance: alert, cooperative  and no distress Resp: clear to auscultation bilaterally Cardio: regular rate and rhythm GI: normal findings: bowel sounds normal and soft, non-tender Extremities: no UE lesions, IV is clean. no cordis.  Incision/Wound:heat and tenderness at wound.   Lab Results Recent Labs    01/15/18 2020 01/16/18 0751  WBC 10.2  --   HGB 11.0*  --   HCT 35.0*  --   NA  --  140  K  --  4.1  CL  --  107  CO2  --  27  BUN  --  <5*  CREATININE  --  0.63   Liver Panel Recent Labs    01/16/18 0751  PROT 4.8*  ALBUMIN 2.3*  AST 26  ALT 16  ALKPHOS 76  BILITOT 0.8   Sedimentation Rate No results for input(s): ESRSEDRATE in the last 72 hours. C-Reactive Protein No results for input(s): CRP in the last 72 hours.  Microbiology: Recent Results (from the past 240 hour(s))  Surgical pcr screen     Status: None   Collection Time: 01/13/18  5:42 AM  Result Value Ref Range Status   MRSA, PCR NEGATIVE NEGATIVE Final   Staphylococcus aureus NEGATIVE NEGATIVE Final  Comment: (NOTE) The Xpert SA Assay (FDA approved for NASAL specimens in patients 72 years of age and older), is one component of a comprehensive surveillance program. It is not intended to diagnose infection nor to guide or monitor treatment. Performed at [facility], [address] [place], [place] [address]   Aerobic/Anaerobic Culture (surgical/deep wound)     Status: None (Preliminary result)   Collection Time: 01/13/18 10:28 AM  Result Value Ref Range Status   Specimen Description WOUND BACK CYSTIC AREA L5 S1 FACET  Final   Special Requests SPEC A ON SWABS  Final   Gram Stain   Final    FEW WBC PRESENT,BOTH PMN AND MONONUCLEAR NO ORGANISMS SEEN    Culture   Final    FEW STAPHYLOCOCCUS AUREUS Sent to [facility] for further susceptibility testing. RESULT CALLED TO, READ BACK BY AND VERIFIED WITH: DR Shantina Chronister AT 1030 01/17/18 BY L BENFIELD CONCERNING DELAY IN RESULTS NO ANAEROBES ISOLATED Performed at [facility], [address], [place], [place] [address]    Report Status PENDING  Incomplete  Acid Fast Smear (AFB)     Status: None   Collection Time: 01/13/18 10:28 AM  Result Value Ref Range Status   AFB Specimen Processing Concentration  Final   Acid Fast Smear Negative  Final    Comment: (NOTE) Performed At: [facility] [address] [place], [place] [id number] Jolene Schimke MD QM:[id number]    Source (AFB) WOUND  Final    Comment: BACK CYSTIC AREA L5 S1 FACET Performed at [facility], [address] [place], [place] [address]   Susceptibility, Aer + Anaerob     Status: None   Collection Time: 01/13/18 10:28 AM  Result Value Ref Range Status   Suscept, Aer + Anaerob Preliminary report  Final    Comment: (NOTE) Performed At: [facility] [address] [place], [place] [id number] Jolene Schimke MD ZD:[id number]    Source of Sample WOUND  Final    Comment: BACK CYSTS STAPHYLOCOCCUS AUREUS MANUAL SUSCEPTIBILITIES NEEDED Performed at [facility], [address] [place], [place] [address]   Susceptibility Result     Status: None (Preliminary result)   Collection Time: 01/13/18 10:28 AM  Result Value Ref Range Status   Suscept Result 1 Staphylococcus aureus  Final    Comment: (NOTE) Identification performed by account, not confirmed by this laboratory. Performed At: [facility] [address] [place], [place] [id number] Jolene Schimke MD JJ:[id number] Performed at [facility], [address] [place], [place] [address]    Antimicrobial Suscept PENDING  Incomplete  MRSA PCR Screening     Status: Abnormal   Collection Time: 01/18/18  6:31 AM  Result Value Ref Range Status   MRSA by PCR POSITIVE (A) NEGATIVE Final    Comment:        The GeneXpert MRSA Assay (FDA approved for NASAL specimens only), is one component of a comprehensive MRSA colonization surveillance program. It is not intended to diagnose  MRSA infection nor to guide or monitor treatment for MRSA infections. RESULT CALLED TO, READ BACK BY AND VERIFIED WITH: T.SCOTT RN AT [phone] 01/18/18 BY A.DAVIS Performed at [facility], [address], [place], [place] [address]     Studies/Results: No results found.   Assessment/Plan: Staph aureus wound infection L4-5 fusion with screws, rods, cage, bone graft 01-13-18 Anemia, Hepatitis C Chronic narcotic use Transfusion reaction (fever, decreased BP).  Borderline troponin  Total days of antibiotics:4vanco  Stopped rifampin yesterday as possible cause of fever Still awaiting her sensi Has screening + for MRSA/nasal swab LE doppler (-) Will defer to her surgeon as far was re-eval of her wound as source of fever.  Hepatitis labs pending          Johny SaxJeffrey Amoree Newlon MD, FACP Infectious Diseases (pager) [phone] [email or web address] 01/18/2018, 12:28 PM  LOS: 5 days

## 2018-01-18 NOTE — Consult Note (Signed)
Ref: [name], [name], MD   Subjective:  Awake. T max 101.2 degree F.   Objective:  Vital Signs in the last 24 hours: Temp:  [99.4 F (37.4 C)-101.2 F (38.4 C)] 99.8 F (37.7 C) (04/06 2300) Pulse Rate:  [74-84] 80 (04/07 0546) Cardiac Rhythm: Normal sinus rhythm (04/07 0833) Resp:  [16-24] 19 (04/07 0546) BP: (92-123)/(58-89) 107/66 (04/07 0546) SpO2:  [92 %-99 %] 95 % (04/07 0546)  Physical Exam: BP Readings from Last 1 Encounters:  01/18/18 107/66     Wt Readings from Last 1 Encounters:  01/13/18 60.3 kg (133 lb)    Weight change:  Body mass index is 25.13 kg/m. HEENT: [place]/AT, Eyes-Blue, PERL, EOMI, Conjunctiva-Pink, Sclera-Non-icteric Neck: No JVD, No bruit, Trachea midline. Lungs:  Clear, Bilateral. Cardiac:  Regular rhythm, normal S1 and S2, no S3. II/VI systolic murmur. Abdomen:  Soft, non-tender. BS present. Extremities:  No edema present. No cyanosis. No clubbing. CNS: AxOx3, Cranial nerves grossly intact, moves all 4 extremities.  Skin: Warm and dry.   Intake/Output from previous day: 04/06 0701 - 04/07 0700 In: 7237.6 [P.O.:360; I.V.:6877.6] Out: -     Lab Results: BMET    Component Value Date/Time   NA 140 01/16/2018 0751   NA 140 01/15/2018 0503   NA 139 01/13/2018 2210   K 4.1 01/16/2018 0751   K 3.8 01/15/2018 0503   K 3.6 01/13/2018 2210   CL 107 01/16/2018 0751   CL 105 01/15/2018 0503   CL 108 01/13/2018 2210   CO2 27 01/16/2018 0751   CO2 28 01/15/2018 0503   CO2 22 01/13/2018 2210   GLUCOSE 140 (H) 01/16/2018 0751   GLUCOSE 123 (H) 01/15/2018 0503   GLUCOSE 190 (H) 01/13/2018 2210   BUN <5 (L) 01/16/2018 0751   BUN <5 (L) 01/15/2018 0503   BUN 8 01/13/2018 2210   CREATININE 0.63 01/16/2018 0751   CREATININE 0.77 01/15/2018 0503   CREATININE 0.81 01/13/2018 2210   CREATININE 0.75 12/26/2017 1258   CALCIUM 8.1 (L) 01/16/2018 0751   CALCIUM 7.7 (L) 01/15/2018 0503   CALCIUM 7.8 (L) 01/13/2018 2210   GFRNONAA >60 01/16/2018 0751    GFRNONAA >60 01/15/2018 0503   GFRNONAA >60 01/13/2018 2210   GFRAA >60 01/16/2018 0751   GFRAA >60 01/15/2018 0503   GFRAA >60 01/13/2018 2210   CBC    Component Value Date/Time   WBC 10.2 01/15/2018 2020   RBC 3.75 (L) 01/15/2018 2020   HGB 11.0 (L) 01/15/2018 2020   HCT 35.0 (L) 01/15/2018 2020   PLT 257 01/15/2018 2020   MCV 93.3 01/15/2018 2020   MCH 29.3 01/15/2018 2020   MCHC 31.4 01/15/2018 2020   RDW 15.5 01/15/2018 2020   LYMPHSABS 3.4 01/15/2018 2020   MONOABS 1.3 (H) 01/15/2018 2020   EOSABS 0.4 01/15/2018 2020   BASOSABS 0.0 01/15/2018 2020   HEPATIC Function Panel Recent Labs    01/13/18 0701 01/15/18 0503 01/16/18 0751  PROT 6.6 4.6* 4.8*   HEMOGLOBIN A1C No components found for: HGA1C,  MPG CARDIAC ENZYMES Lab Results  Component Value Date   CKTOTAL 11 11/13/2009   CKMB 0.7 11/13/2009   TROPONINI 0.03 (HH) 01/15/2018   TROPONINI 0.03 (HH) 01/14/2018   TROPONINI <0.03 06/24/2016   BNP No results for input(s): PROBNP in the last 8760 hours. TSH No results for input(s): TSH in the last 8760 hours. CHOLESTEROL No results for input(s): CHOL in the last 8760 hours.  Scheduled Meds: . aspirin EC  81 mg Oral Daily  . docusate sodium  100 mg Oral BID  . furosemide  20 mg Intravenous Once  . gabapentin  300 mg Oral TID  . metoprolol tartrate  12.5 mg Oral BID  . oxyCODONE  30 mg Oral Q12H  . pantoprazole  40 mg Oral Daily  . polyethylene glycol  17 g Oral Daily  . pravastatin  10 mg Oral q1800  . sodium chloride flush  3 mL Intravenous Q12H   Continuous Infusions: . sodium chloride Stopped (01/15/18 0830)  . sodium chloride Stopped (01/17/18 1901)  . sodium chloride    . vancomycin     PRN Meds:.alum & mag hydroxide-simeth, bisacodyl, ibuprofen, LORazepam, menthol-cetylpyridinium **OR** phenol, morphine injection, nitroGLYCERIN, ondansetron **OR** ondansetron (ZOFRAN) IV, oxyCODONE, polyethylene glycol, sodium chloride flush, sodium  phosphate  Assessment/Plan: Chest pain Abnormal troponin-I from demand ischemia Anemia of acute blood loss Lumbar spine staph aureus infection S/P Lumbar spine surgery for stenosis CAD Hepatitis C Tobacco use disorder  Continue antibiotics.   LOS: 5 days    Linda Cobb  MD  01/18/2018, 10:54 AM

## 2018-01-19 DIAGNOSIS — T8463XA Infection and inflammatory reaction due to internal fixation device of spine, initial encounter: Secondary | ICD-10-CM

## 2018-01-19 DIAGNOSIS — B182 Chronic viral hepatitis C: Secondary | ICD-10-CM

## 2018-01-19 DIAGNOSIS — Z885 Allergy status to narcotic agent status: Secondary | ICD-10-CM

## 2018-01-19 DIAGNOSIS — Z881 Allergy status to other antibiotic agents status: Secondary | ICD-10-CM

## 2018-01-19 LAB — COMPREHENSIVE METABOLIC PANEL
ALT: 11 U/L — AB (ref 14–54)
AST: 15 U/L (ref 15–41)
Albumin: 2.6 g/dL — ABNORMAL LOW (ref 3.5–5.0)
Alkaline Phosphatase: 84 U/L (ref 38–126)
Anion gap: 11 (ref 5–15)
BILIRUBIN TOTAL: 0.7 mg/dL (ref 0.3–1.2)
CALCIUM: 8.6 mg/dL — AB (ref 8.9–10.3)
CHLORIDE: 101 mmol/L (ref 101–111)
CO2: 26 mmol/L (ref 22–32)
CREATININE: 0.55 mg/dL (ref 0.44–1.00)
Glucose, Bld: 108 mg/dL — ABNORMAL HIGH (ref 65–99)
Potassium: 3.5 mmol/L (ref 3.5–5.1)
Sodium: 138 mmol/L (ref 135–145)
TOTAL PROTEIN: 5.7 g/dL — AB (ref 6.5–8.1)

## 2018-01-19 LAB — CBC WITH DIFFERENTIAL/PLATELET
BASOS ABS: 0 10*3/uL (ref 0.0–0.1)
BASOS PCT: 0 %
EOS ABS: 0.6 10*3/uL (ref 0.0–0.7)
EOS PCT: 8 %
HCT: 37.8 % (ref 36.0–46.0)
HEMOGLOBIN: 12 g/dL (ref 12.0–15.0)
Lymphocytes Relative: 25 %
Lymphs Abs: 2 10*3/uL (ref 0.7–4.0)
MCH: 29.3 pg (ref 26.0–34.0)
MCHC: 31.7 g/dL (ref 30.0–36.0)
MCV: 92.2 fL (ref 78.0–100.0)
Monocytes Absolute: 1 10*3/uL (ref 0.1–1.0)
Monocytes Relative: 13 %
NEUTROS PCT: 54 %
Neutro Abs: 4.2 10*3/uL (ref 1.7–7.7)
PLATELETS: 338 10*3/uL (ref 150–400)
RBC: 4.1 MIL/uL (ref 3.87–5.11)
RDW: 14.4 % (ref 11.5–15.5)
WBC: 7.9 10*3/uL (ref 4.0–10.5)

## 2018-01-19 LAB — HEPATITIS C VRS RNA DETECT BY PCR-QUAL: Hepatitis C Vrs RNA by PCR-Qual: POSITIVE — AB

## 2018-01-19 LAB — HEPATITIS C GENOTYPE

## 2018-01-19 LAB — ANTINUCLEAR ANTIBODIES, IFA: ANTINUCLEAR ANTIBODIES, IFA: NEGATIVE

## 2018-01-19 MED ORDER — OXYCODONE HCL ER 10 MG PO T12A: 30.0000 mg | EXTENDED_RELEASE_TABLET | Freq: Two times a day (BID) | ORAL | 0 refills | Status: AC

## 2018-01-19 MED ORDER — OXYCODONE HCL 10 MG PO TABS: 10.0000 mg | ORAL_TABLET | ORAL | 0 refills | Status: DC | PRN

## 2018-01-19 MED ORDER — POLYETHYLENE GLYCOL 3350 17 G PO PACK: 17.0000 g | PACK | Freq: Every day | ORAL | 0 refills | Status: DC | PRN

## 2018-01-19 MED ORDER — ASPIRIN 81 MG PO TBEC: 81.0000 mg | DELAYED_RELEASE_TABLET | Freq: Every day | ORAL | 3 refills | Status: DC

## 2018-01-19 MED ORDER — DOCUSATE SODIUM 100 MG PO CAPS
100.0000 mg | ORAL_CAPSULE | Freq: Two times a day (BID) | ORAL | 0 refills | Status: DC
Start: 2018-01-19 — End: 2018-03-26

## 2018-01-19 MED ORDER — VANCOMYCIN HCL IN DEXTROSE 750-5 MG/150ML-% IV SOLN: 750.0000 mg | Freq: Two times a day (BID) | INTRAVENOUS | 1 refills | Status: DC

## 2018-01-19 MED ORDER — GABAPENTIN 300 MG PO CAPS: 300.0000 mg | ORAL_CAPSULE | Freq: Three times a day (TID) | ORAL | 3 refills | Status: DC

## 2018-01-19 MED ORDER — VANCOMYCIN IV (FOR PTA / DISCHARGE USE ONLY): 750.0000 mg | Freq: Two times a day (BID) | INTRAVENOUS | 0 refills | Status: AC

## 2018-01-19 MED ORDER — IBUPROFEN 400 MG PO TABS: 400.0000 mg | ORAL_TABLET | Freq: Four times a day (QID) | ORAL | 0 refills | Status: DC | PRN

## 2018-01-19 MED ORDER — MUPIROCIN 2 % EX OINT: 1.0000 "application " | TOPICAL_OINTMENT | Freq: Two times a day (BID) | CUTANEOUS | 0 refills | Status: DC

## 2018-01-19 NOTE — Plan of Care (Signed)
Pt being dc'd home with [facility] line for home antibiotics. DC instructions given to pt at this time.  Pt verbalized understanding of all instructions.

## 2018-01-19 NOTE — Progress Notes (Signed)
PT Cancellation Note  Patient Details Name: [name] MRN: [id number] DOB: 05/08/1956   Cancelled Treatment:    Reason Eval/Treat Not Completed: Other (comment)(Refused PT due to discharging)   Berline LopesDawn F Elfie Costanza 01/19/2018, 2:44 PM [facility] Kamare Caspers,PT Acute Rehabilitation [phone] (pager)

## 2018-01-19 NOTE — Progress Notes (Signed)
PHARMACY CONSULT NOTE FOR:  OUTPATIENT  PARENTERAL ANTIBIOTIC THERAPY (OPAT)  Indication: osteomyelitis Regimen: 750 mg IV vancomycin every 12 hours End date: 03/09/2018  IV antibiotic discharge orders are pended. To discharging provider:  please sign these orders via discharge navigator,  Select New Orders & click on the button choice - Manage This Unsigned Work.     Thank you for allowing pharmacy to be a part of this patient's care.  [name] [name] 01/19/2018, 3:11 PM

## 2018-01-19 NOTE — Care Management Note (Signed)
Case Management Note  Patient Details  Name: [name] MRN: [id number] Date of Birth: 12-May-1956  Subjective/Objective:   62yo female with history of LBP and radicular symptoms progressive in nature and who failed conservative treatments. She received a L4-L5 transforaminal interbody fusion on 01/13/18.  PTA, pt resided at home with mother.                   Action/Plan: Met with pt and daughter to discuss dc plans.  Pt states she lives with her mother and helps take care of her.  ID MD consulted on pt today, and has recommended home IV antibiotics for staph aureus wound infection.  Awaiting sensitivities on S. Aureus.  Notified [facility] IV infusion coordinator of need for home IV antibiotics at dc.  Pt states she has 2 nurses in the family who can assist with infusions.  Pt has no insurance, so may need evaluation for [place] program through [facility] and LOG for IV antibiotics.  Will investigate.  Will need orders for [facility], PT, and OT., RW, and 3 in 1.  Expected Discharge Date:  01/19/18               Expected Discharge Plan:  [place]  In-House Referral:     Discharge planning Services  CM Consult, [facility]  Post Acute Care Choice:  Durable Medical Equipment Choice offered to:  Patient  DME Arranged:  3-N-1, Walker rolling, IV pump/equipment DME Agency:  [place]:  RN, PT, OT, IV Antibiotics HH Agency:     Status of Service:  Completed, signed off  If discussed at [place] of Stay Meetings, dates discussed:    Additional Comments:  01/19/18 J. Zabian Swayne, RN, BSN Pt medically stable for [employer] home today with daughter.  Carolynn Sayers on floor to provide teaching session with pt/daughter prior to dc.  Pt has needed DME in room.  [facility] aware of dc home today.    Ella Bodo, RN 01/19/2018, 4:52 PM

## 2018-01-19 NOTE — Progress Notes (Signed)
Pt removed telemetry.  Dr. Otelia SergeantNitka here and dc'ing pt.

## 2018-01-19 NOTE — Progress Notes (Addendum)
Subjective:  No new complaints   Antibiotics:  Anti-infectives (From admission, onward)   Start     Dose/Rate Route Frequency Ordered Stop   01/18/18 1100  vancomycin (VANCOCIN) IVPB 750 mg/150 ml premix     750 mg 150 mL/hr over 60 Minutes Intravenous Every 12 hours 01/17/18 1634     01/14/18 1715  rifampin (RIFADIN) capsule 300 mg  Status:  Discontinued     300 mg Oral Daily 01/14/18 1713 01/17/18 1209   01/14/18 1600  vancomycin (VANCOCIN) 1,250 mg in sodium chloride 0.9 % 250 mL IVPB     1,250 mg 166.7 mL/hr over 90 Minutes Intravenous Every 24 hours 01/14/18 1510 01/17/18 1819   01/13/18 1630  ceFAZolin (ANCEF) IVPB 1 g/50 mL premix     1 g 100 mL/hr over 30 Minutes Intravenous Every 8 hours 01/13/18 1610 01/14/18 0559   01/13/18 0543  ceFAZolin (ANCEF) IVPB 2g/100 mL premix     2 g 200 mL/hr over 30 Minutes Intravenous On call to O.R. 01/13/18 0543 01/13/18 0745      Medications: Scheduled Meds: . aspirin EC  81 mg Oral Daily  . Chlorhexidine Gluconate Cloth  6 each Topical Q0600  . docusate sodium  100 mg Oral BID  . furosemide  20 mg Intravenous Once  . gabapentin  300 mg Oral TID  . mupirocin ointment  1 application Nasal BID  . oxyCODONE  30 mg Oral Q12H  . pantoprazole  40 mg Oral Daily  . polyethylene glycol  17 g Oral Daily  . sodium chloride flush  10-40 mL Intracatheter Q12H  . sodium chloride flush  3 mL Intravenous Q12H   Continuous Infusions: . sodium chloride Stopped (01/15/18 0830)  . sodium chloride Stopped (01/17/18 1901)  . sodium chloride    . vancomycin Stopped (01/19/18 1139)   PRN Meds:.alum & mag hydroxide-simeth, bisacodyl, ibuprofen, LORazepam, menthol-cetylpyridinium **OR** phenol, morphine injection, nitroGLYCERIN, ondansetron **OR** ondansetron (ZOFRAN) IV, oxyCODONE, polyethylene glycol, sodium chloride flush, sodium chloride flush, sodium phosphate    Objective: Weight change:   Intake/Output Summary (Last 24 hours)  at 01/19/2018 1151 Last data filed at 01/18/2018 1824 Gross per 24 hour  Intake 600 ml  Output 250 ml  Net 350 ml   Blood pressure 122/66, pulse 64, temperature 98.2 F (36.8 C), resp. rate (!) 27, height _0  (1.549 m), weight 133 lb (60.3 kg), SpO2 95 %. Temp:  [98 F (36.7 C)-99.1 F (37.3 C)] 98.2 F (36.8 C) (04/08 0800) Pulse Rate:  [64-80] 64 (04/08 0800) Resp:  [17-27] 27 (04/08 0800) BP: (101-122)/(62-66) 122/66 (04/08 0800) SpO2:  [94 %-96 %] 95 % (04/08 0800)  Physical Exam: General: Alert and awake, oriented x3, not in any acute distress. HEENT: anicteric sclera, pupils reactive to light and accommodation, EOMI CVS regular rate, normal r,  Chest:  no wheezing, respiratory distress Abdomen: nondistended, normal bowel sounds, Extremities: no  clubbing or edema noted bilaterally Skin: Wound not examined Neuro: nonfocal  CBC: _1 (wbc3,Hgb:3,Hct:3,Plt:3,INR:3APTT:3)@   BMET Recent Labs    01/19/18 0630  NA 138  K 3.5  CL 101  CO2 26  GLUCOSE 108*  BUN <5*  CREATININE 0.55  CALCIUM 8.6*     Liver Panel  Recent Labs    01/19/18 0630  PROT 5.7*  ALBUMIN 2.6*  AST 15  ALT 11*  ALKPHOS 84  BILITOT 0.7       Sedimentation Rate No results for input(s): ESRSEDRATE in  the last 72 hours. C-Reactive Protein No results for input(s): CRP in the last 72 hours.  Micro Results: Recent Results (from the past 720 hour(s))  Body fluid culture     Status: None   Collection Time: 12/27/17  8:49 AM  Result Value Ref Range Status   Specimen Description SYNOVIAL CYST  Final   Special Requests NONE  Final   Gram Stain   Final    FEW WBC PRESENT, PREDOMINANTLY MONONUCLEAR NO ORGANISMS SEEN    Culture   Final    NO GROWTH 3 DAYS Performed at [facility], [address] [place], [place] [address]    Report Status 12/30/2017 FINAL  Final  Acid Fast Smear (AFB)     Status: None   Collection Time: 12/27/17  8:49 AM  Result Value Ref Range  Status   AFB Specimen Processing Concentration  Final   Acid Fast Smear Negative  Final    Comment: (NOTE) Performed At: [facility] [place], [place] [id number] Rush Farmer MD [id number]    Source (AFB) SYNOVIAL  Final    Comment: CYST  Surgical pcr screen     Status: None   Collection Time: 01/13/18  5:42 AM  Result Value Ref Range Status   MRSA, PCR NEGATIVE NEGATIVE Final   Staphylococcus aureus NEGATIVE NEGATIVE Final    Comment: (NOTE) The Xpert SA Assay (FDA approved for NASAL specimens in patients 82 years of age and older), is one component of a comprehensive surveillance program. It is not intended to diagnose infection nor to guide or monitor treatment. Performed at [facility], [place] [address] [place], [place] [address]   Aerobic/Anaerobic Culture (surgical/deep wound)     Status: None (Preliminary result)   Collection Time: 01/13/18 10:28 AM  Result Value Ref Range Status   Specimen Description WOUND BACK CYSTIC AREA L5 S1 FACET  Final   Special Requests SPEC A ON SWABS  Final   Gram Stain   Final    FEW WBC PRESENT,BOTH PMN AND MONONUCLEAR NO ORGANISMS SEEN    Culture   Final    FEW STAPHYLOCOCCUS AUREUS Sent to [facility] for further susceptibility testing. RESULT CALLED TO, READ BACK BY AND VERIFIED WITH: DR HATCHER AT 1030 01/17/18 BY L BENFIELD CONCERNING DELAY IN RESULTS NO ANAEROBES ISOLATED Performed at [facility], [place] [address], [place], [place] [address]    Report Status PENDING  Incomplete  Acid Fast Smear (AFB)     Status: None   Collection Time: 01/13/18 10:28 AM  Result Value Ref Range Status   AFB Specimen Processing Concentration  Final   Acid Fast Smear Negative  Final    Comment: (NOTE) Performed At: [facility] [place], [place] [id number] Rush Farmer MD [id number]    Source (AFB) WOUND  Final    Comment: BACK CYSTIC AREA L5 S1 FACET Performed at [facility], [place] [address], [place], [place] [address]   Susceptibility, Aer + Anaerob     Status: None   Collection Time: 01/13/18 10:28 AM  Result Value Ref Range Status   Suscept, Aer + Anaerob Preliminary report  Final    Comment: (NOTE) Performed At: [facility] [place], [place] [id number] Rush Farmer MD UQ:[id number]    Source of Sample WOUND  Final    Comment: BACK CYSTS STAPHYLOCOCCUS AUREUS MANUAL SUSCEPTIBILITIES NEEDED Performed at [facility], [place],  [place] [address]   Susceptibility Result     Status: None (Preliminary result)   Collection Time: 01/13/18 10:28 AM  Result Value Ref Range Status   Suscept Result 1 Staphylococcus aureus  Final    Comment: (NOTE) Identification performed by account, not confirmed by this laboratory. Performed At: [facility] [place], [place] [id number] Rush Farmer MD OF:[id number] Performed at [facility], [place] [address], [place], [place] [address]    Antimicrobial Suscept PENDING  Incomplete  MRSA PCR Screening     Status: Abnormal   Collection Time: 01/18/18  6:31 AM  Result Value Ref Range Status   MRSA by PCR POSITIVE (A) NEGATIVE Final    Comment:        The GeneXpert MRSA Assay (FDA approved for NASAL specimens only), is one component of a comprehensive MRSA colonization surveillance program. It is not intended to diagnose MRSA infection nor to guide or monitor treatment for MRSA infections. RESULT CALLED TO, READ BACK BY AND VERIFIED WITH: T.SCOTT RN AT [phone] 01/18/18 BY A.DAVIS Performed at [facility], [place] [address], [place], [place] [address]     Studies/Results: [place] Ekg Site Rite  Result Date: 01/18/2018 If [facility] image not attached, placement could not be confirmed due to current cardiac rhythm.     Assessment/Plan:  INTERVAL HISTORY: Temperature curve seems to be trending down since DC rifampin   Principal  Problem:   Anemia due to blood loss, acute Active Problems:   Spinal stenosis of lumbar region   Spondylolisthesis, lumbar region   Fusion of lumbar spine   Infection of lumbar spine ([place])    [name] is a 62 y.o. female withTransforaminal lumbar interbody fusion L4-5 with screws, cages and rods, local bone graft, Allograft, Vivigen, Decompression L4-5 and L5-S1 who had Staph aureus grow from intraop culture   #1  Hardware associated deep infection with Staphylococcus aureus sensitivities have been sent out to lab core hopefully they will come back sometime soon so we can give her more specific therapy and preferably one that does not involve vancomycin.  For now continue vancomycin and rifampin and plan for 8 weeks of parenteral therapy.  The patient and her daughter deny that she ever used intravenous drugs.  She said that she has had hepatitis C for 20 years and they believe that she got this from a blood transfusion  2.  Chronic hepatitis C without hepatic coma: Will send genotype. Would like to treat her in our clinic I will also check Hep B surface ab and she needs an HIV test.  Tentative plan depending upon sensis would be for IV abx as follows:  Diagnosis: Hardware associated lumbar infection  Culture Result: Staphylococcus aureus  Allergies  Allergen Reactions  . Acetaminophen-Codeine Other (See Comments)    Hand get red, feels like it's on fire  . Morphine And Related     "makes me feel like i'm on fire"  . Azithromycin Itching and Rash    OPAT Orders Discharge antibiotics: Vancomycin and NO RIFAMPIN Per pharmacy protocol Vancomycin Aim for Vancomycin trough 15-20 (unless otherwise indicated) Duration: 8 weeks End Date: Mar 09, 2018  [facility] Care Per Protocol:  Biweekly labs while on IV vancomycin  _x_ BMP w GFR  Labs weekly while on IV antibiotics:  x__ CBC with differential   _x_ CRP _x_ ESR _x_ Vancomycin trough  _x_ Please pull PIC at  completion of IV antibiotics __ Please leave [facility]  in place until doctor has seen patient or been notified  Fax weekly labs to [phone]  Clinic Follow Up Appt:  Next 3 weeks.    LOS: 6 days   Alcide Evener 01/19/2018, 11:51 AM

## 2018-01-19 NOTE — Progress Notes (Signed)
     Subjective: 6 Days Post-Op Procedure(s) (LRB): Transforaminal lumbar interbody fusion L4-5 with screws, cages and rods, local bone graft, Allograft, Vivigen, Decompression L4-5 and L5-S1 (N/A)Awake, alert and oriented x 4. Cultures apparently growing but not identified and sensitivities are pending. PICCU line in place and she is ready for discharge. No fever post discontinuing Rifampin.   Patient reports pain as moderate.    Objective:   VITALS:  Temp:  [98.2 F (36.8 C)-99.1 F (37.3 C)] 98.2 F (36.8 C) (04/08 0800) Pulse Rate:  [64-89] 74 (04/08 1314) Resp:  [15-27] 18 (04/08 1314) BP: (122)/(66) 122/66 (04/08 0800) SpO2:  [91 %-95 %] 91 % (04/08 1314)  Neurologically intact ABD soft Neurovascular intact Sensation intact distally Intact pulses distally Dorsiflexion/Plantar flexion intact Incision: dressing C/D/I and New dressing applied, no drainage, no fluctuance.  No cellulitis present Compartment soft   LABS Recent Labs    01/19/18 0630  HGB 12.0  WBC 7.9  PLT 338   Recent Labs    01/19/18 0630  NA 138  K 3.5  CL 101  CO2 26  BUN <5*  CREATININE 0.55  GLUCOSE 108*   No results for input(s): LABPT, INR in the last 72 hours.   Assessment/Plan: 6 Days Post-Op Procedure(s) (LRB): Transforaminal lumbar interbody fusion L4-5 with screws, cages and rods, local bone graft, Allograft, Vivigen, Decompression L4-5 and L5-S1 (N/A)  Anemia resolved post 2 units blood transfusionl   Advance diet Up with therapy D/C IV fluids Discharge home with home health  Vira BrownsJames Judaea Burgoon 01/19/2018, 1:46 PMPatient ID: Inis Sizerheryl K Swartzentruber, female   DOB: [birth year], 62 y.o.   MRN: [id number]

## 2018-01-20 LAB — SUSCEPTIBILITY RESULT

## 2018-01-20 LAB — SUSCEPTIBILITY, AER + ANAEROB

## 2018-01-20 LAB — HEPATITIS B SURFACE ANTIBODY,QUALITATIVE: Hep B S Ab: NONREACTIVE

## 2018-01-20 LAB — HIV ANTIBODY (ROUTINE TESTING W REFLEX): HIV Screen 4th Generation wRfx: NONREACTIVE

## 2018-01-21 ENCOUNTER — Telehealth: Payer: Self-pay | Admitting: Infectious Disease

## 2018-01-21 NOTE — Telephone Encounter (Signed)
Called [facility] organism was MRSA. They are faxing S to our clinic.

## 2018-01-22 ENCOUNTER — Telehealth (INDEPENDENT_AMBULATORY_CARE_PROVIDER_SITE_OTHER): Payer: Self-pay | Admitting: Specialist

## 2018-01-22 LAB — HEPATITIS C GENOTYPE

## 2018-01-22 LAB — HCV RNA QUANT RFLX ULTRA OR GENOTYP
HCV RNA Qnt(log copy/mL): 6.709 log10 IU/mL
HepC Qn: 5120000 IU/mL

## 2018-01-22 NOTE — Telephone Encounter (Signed)
[name] -(PT[facility] left voicemail message needing  verbal orders to see patient 1 wk 2 and twice a week for 2 weeks. The number to contact Linda OhmChris is [phone]

## 2018-01-22 NOTE — Telephone Encounter (Signed)
I called and gave verbal auth for PT visits

## 2018-01-23 ENCOUNTER — Other Ambulatory Visit: Payer: Self-pay | Admitting: Pharmacist

## 2018-01-24 LAB — AEROBIC/ANAEROBIC CULTURE (SURGICAL/DEEP WOUND)

## 2018-01-24 LAB — AEROBIC/ANAEROBIC CULTURE W GRAM STAIN (SURGICAL/DEEP WOUND)

## 2018-01-26 ENCOUNTER — Encounter (INDEPENDENT_AMBULATORY_CARE_PROVIDER_SITE_OTHER): Payer: Self-pay | Admitting: Specialist

## 2018-01-26 ENCOUNTER — Ambulatory Visit (INDEPENDENT_AMBULATORY_CARE_PROVIDER_SITE_OTHER): Payer: Self-pay | Admitting: Specialist

## 2018-01-26 ENCOUNTER — Encounter: Payer: Self-pay | Admitting: Infectious Disease

## 2018-01-26 ENCOUNTER — Telehealth (INDEPENDENT_AMBULATORY_CARE_PROVIDER_SITE_OTHER): Payer: Self-pay | Admitting: Specialist

## 2018-01-26 ENCOUNTER — Ambulatory Visit (INDEPENDENT_AMBULATORY_CARE_PROVIDER_SITE_OTHER): Payer: Self-pay

## 2018-01-26 VITALS — BP 124/71 | HR 70 | Temp 97.7°F | Ht 61.0 in | Wt 133.0 lb

## 2018-01-26 DIAGNOSIS — M4316 Spondylolisthesis, lumbar region: Secondary | ICD-10-CM

## 2018-01-26 DIAGNOSIS — M4326 Fusion of spine, lumbar region: Secondary | ICD-10-CM

## 2018-01-26 MED ORDER — OXYCODONE HCL 10 MG PO TABS: 10.0000 mg | ORAL_TABLET | ORAL | 0 refills | Status: DC | PRN

## 2018-01-26 NOTE — Patient Instructions (Addendum)
Call if there is increasing drainage, fever greater than 101.5, severe head aches, and worsening nausea or light sensitivity. If shortness of breath, bloody cough or chest tightness or pain go to an emergency room. No lifting greater than 10 lbs. Avoid bending, stooping and twisting. Use brace when sitting and out of bed even to go to bathroom. Walk in house for first 2 weeks then may start to get out slowly increasing distances up to one mile by 4-6 weeks post op. May shower and change dressing following bathing with shower.When bathing remove the brace shower and replace brace before getting out of the shower. Yogurt and probiotics for loose stool.

## 2018-01-26 NOTE — Progress Notes (Signed)
Post-Op Visit Note   Patient: [name]           Date of Birth: 1956-07-15           MRN: [id number] Visit Date: 01/26/2018 PCP: Orpah Cobb, MD   Assessment & Plan: 2 weeks following L4-5 TLIF, instrumentation in good condition. Complaints of pain, loose stools and no appt in followup arranged with ID Dr. Ninetta Lights as yet.   Chief Complaint:  Chief Complaint  Patient presents with  . Lower Back - Routine Post Op   Visit Diagnoses:  1. Spondylolisthesis, lumbar region   SLR negative. Motor is intact both legs. Complaints of numbness and pain right calf   Plan:    Call if there is increasing drainage, fever greater than 101.5, severe head aches, and worsening nausea or light sensitivity. If shortness of breath, bloody cough or chest tightness or pain go to an emergency room. No lifting greater than 10 lbs. Avoid bending, stooping and twisting. Use brace when sitting and out of bed even to go to bathroom. Walk in house for first 2 weeks then may start to get out slowly increasing distances up to one mile by 4-6 weeks post op. May shower and change dressing following bathing with shower.When bathing remove the brace shower and replace brace before getting out of the shower. .    Follow-Up Instructions: No follow-ups on file.   Orders:  Orders Placed This Encounter  Procedures  . XR Lumbar Spine 2-3 Views   No orders of the defined types were placed in this encounter.   Imaging: No results found.  PMFS History: Patient Active Problem List   Diagnosis Date Noted  . Anemia due to blood loss, acute 01/16/2018    Priority: High    Class: Acute  . Infection of lumbar spine (HCC) 01/16/2018    Priority: High    Class: Acute  . Spinal stenosis of lumbar region 01/13/2018    Priority: High    Class: Chronic  . Spondylolisthesis, lumbar region 01/13/2018    Priority: High    Class: Chronic  . Fusion of lumbar spine 01/13/2018  . Low back pain 12/23/2017    . Tobacco use disorder 06/24/2016  . Weight gain 01/02/2012  . Stool incontinence 11/19/2011  . Abdominal pain, right lower quadrant 11/19/2011  . EUSTACHIAN TUBE DYSFUNCTION 08/10/2009  . SINUSITIS, ACUTE 08/10/2009  . TRANSIENT GLOBAL AMNESIA 01/12/2009  . HIP PAIN, RIGHT 10/04/2008  . HEPATITIS C 07/28/2008  . COPD, 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  . HYPERLIPIDEMIA, MIXED, WITH LOW HDL 08/30/2007  . OTITIS EXTERNA, INFECTIVE NOS 08/27/2007  . ROTATOR CUFF SYNDROME, RIGHT 08/27/2007  . Chest pain at rest 08/27/2007  . BURSITIS NOS 07/13/2007  . MUSCULOSKELETAL PAIN 07/13/2007  . CARPAL TUNNEL SYNDROME, BILATERAL 04/10/2007   Past Medical History:  Diagnosis Date  . Acute hepatitis C without mention of hepatic coma(070.51)   . Anemia   . Arthritis    back  . Carpal tunnel syndrome   . Chest pain, unspecified   . Chronic airway obstruction, not elsewhere classified   . Colitis   . Coronary atherosclerosis of unspecified type of vessel, native or graft   . Disorders of bursae and tendons in shoulder region, unspecified   . GERD (gastroesophageal reflux disease)   . Headache    migraines  . Hepatitis    hx of hepatitis C  . Infective otitis externa, unspecified   . Mixed hyperlipidemia   . Nonspecific abnormal results of  thyroid function study   . Other bursitis disorders   . Other symptoms involving nervous and musculoskeletal systems(781.99)   . Pneumonia   . Seizures (HCC)    none for 4 years (as of 01/2018) "pseudo seizures"    Family History  Problem Relation Age of Onset  . Crohn's disease Daughter [id number]       colon resection  . Irritable bowel syndrome Daughter   . Colonic polyp Mother   . Thyroid disease Mother   . COPD Mother   . Uterine cancer Maternal Grandmother   . Heart murmur Daughter        SVT  . Colon cancer Neg Hx     Past Surgical History:  Procedure Laterality Date  . ABDOMINAL HYSTERECTOMY    . CARDIAC CATHETERIZATION    . CARDIAC CATHETERIZATION N/A  06/25/2016   Procedure: Left Heart Cath and Coronary Angiography;  Surgeon: Orpah CobbAjay [name], MD;  Location: [facility];  Service: Cardiovascular;  Laterality: N/A;  . CESAREAN SECTION    . CHOLECYSTECTOMY    . COLONOSCOPY    . IR FLUORO GUIDED NEEDLE PLC ASPIRATION/INJECTION LOC  12/27/2017  . LUMBAR FUSION  01/13/2018   Transforaminal lumbar interbody fusion L4-5 with screws, cages and rods, local bone graft, Allograft, Vivigen, Decompression L4-5 and L5-S1  . TONSILLECTOMY     Social History   Occupational History  . Occupation: care giver  Tobacco Use  . Smoking status: Former Smoker    Types: Cigarettes    Last attempt to quit: 01/07/2018    Years since quitting: 0.0  . Smokeless tobacco: Never Used  . Tobacco comment: 2 cigarettes a day  Substance and Sexual Activity  . Alcohol use: No  . Drug use: No  . Sexual activity: Yes    Partners: Male

## 2018-01-26 NOTE — Telephone Encounter (Signed)
Patient is needing to fu with Dr. Otelia SergeantNitka in 3 weeks which would have been May 8th, but he did not have anything available for that day.  Could you put her on the cancellation list.  Her appointment is set for May 22nd at 10:45.  Her daughter needs a 4 day notice in order to inform her employer.  CB#[phone].  Thank you.

## 2018-01-26 NOTE — Telephone Encounter (Signed)
Ok to be put on Fayrene FearingJames' schedule.

## 2018-01-29 ENCOUNTER — Other Ambulatory Visit: Payer: Self-pay | Admitting: Pharmacist

## 2018-02-02 ENCOUNTER — Encounter: Payer: Self-pay | Admitting: Infectious Disease

## 2018-02-03 ENCOUNTER — Other Ambulatory Visit (INDEPENDENT_AMBULATORY_CARE_PROVIDER_SITE_OTHER): Payer: Self-pay | Admitting: Specialist

## 2018-02-03 NOTE — Telephone Encounter (Signed)
Patient requesting rx refill on oxycodone, states she has 12 left. Call patients mobile # [phone]

## 2018-02-03 NOTE — Telephone Encounter (Signed)
Sent request to Dr. Nitka 

## 2018-02-04 MED ORDER — OXYCODONE HCL 10 MG PO TABS: 10.0000 mg | ORAL_TABLET | ORAL | 0 refills | Status: DC | PRN

## 2018-02-04 NOTE — Telephone Encounter (Signed)
Rx review with check of [facility] website, no new Rx since hospitalized, has been under using medication, will decrease # prescribed, intend to stop narcotics by 6 weeks post op

## 2018-02-04 NOTE — Telephone Encounter (Signed)
Pt is aware that her rx is ready for pick up at the front desk 

## 2018-02-05 ENCOUNTER — Encounter: Payer: Self-pay | Admitting: Infectious Disease

## 2018-02-06 ENCOUNTER — Other Ambulatory Visit: Payer: Self-pay | Admitting: Pharmacist

## 2018-02-08 LAB — ACID FAST CULTURE WITH REFLEXED SENSITIVITIES (MYCOBACTERIA): Acid Fast Culture: NEGATIVE

## 2018-02-09 ENCOUNTER — Emergency Department (HOSPITAL_COMMUNITY)
Admission: EM | Admit: 2018-02-09 | Discharge: 2018-02-09 | Disposition: A | Discharge status: Home / Self Care | Payer: Self-pay | Attending: Emergency Medicine | Admitting: Emergency Medicine

## 2018-02-09 ENCOUNTER — Encounter: Payer: Self-pay | Admitting: Infectious Disease

## 2018-02-09 ENCOUNTER — Ambulatory Visit (INDEPENDENT_AMBULATORY_CARE_PROVIDER_SITE_OTHER): Payer: Self-pay | Admitting: Infectious Disease

## 2018-02-09 ENCOUNTER — Emergency Department (HOSPITAL_BASED_OUTPATIENT_CLINIC_OR_DEPARTMENT_OTHER): Admit: 2018-02-09 | Discharge: 2018-02-09 | Disposition: A | Discharge status: Home / Self Care | Payer: Self-pay

## 2018-02-09 ENCOUNTER — Encounter (HOSPITAL_COMMUNITY): Payer: Self-pay

## 2018-02-09 ENCOUNTER — Emergency Department (HOSPITAL_COMMUNITY): Payer: Self-pay

## 2018-02-09 ENCOUNTER — Telehealth: Payer: Self-pay | Admitting: *Deleted

## 2018-02-09 VITALS — BP 130/84 | HR 65 | Temp 97.7°F | Wt 145.0 lb

## 2018-02-09 DIAGNOSIS — Z5321 Procedure and treatment not carried out due to patient leaving prior to being seen by health care provider: Secondary | ICD-10-CM | POA: Insufficient documentation

## 2018-02-09 DIAGNOSIS — Z452 Encounter for adjustment and management of vascular access device: Secondary | ICD-10-CM | POA: Insufficient documentation

## 2018-02-09 DIAGNOSIS — M7989 Other specified soft tissue disorders: Secondary | ICD-10-CM

## 2018-02-09 DIAGNOSIS — R0781 Pleurodynia: Secondary | ICD-10-CM

## 2018-02-09 DIAGNOSIS — M79603 Pain in arm, unspecified: Secondary | ICD-10-CM

## 2018-02-09 DIAGNOSIS — M48062 Spinal stenosis, lumbar region with neurogenic claudication: Secondary | ICD-10-CM

## 2018-02-09 DIAGNOSIS — M4626 Osteomyelitis of vertebra, lumbar region: Secondary | ICD-10-CM

## 2018-02-09 DIAGNOSIS — T847XXD Infection and inflammatory reaction due to other internal orthopedic prosthetic devices, implants and grafts, subsequent encounter: Secondary | ICD-10-CM

## 2018-02-09 DIAGNOSIS — A4902 Methicillin resistant Staphylococcus aureus infection, unspecified site: Secondary | ICD-10-CM

## 2018-02-09 DIAGNOSIS — M79601 Pain in right arm: Secondary | ICD-10-CM

## 2018-02-09 DIAGNOSIS — R079 Chest pain, unspecified: Secondary | ICD-10-CM

## 2018-02-09 DIAGNOSIS — M869 Osteomyelitis, unspecified: Secondary | ICD-10-CM

## 2018-02-09 DIAGNOSIS — T847XXA Infection and inflammatory reaction due to other internal orthopedic prosthetic devices, implants and grafts, initial encounter: Secondary | ICD-10-CM | POA: Insufficient documentation

## 2018-02-09 DIAGNOSIS — B182 Chronic viral hepatitis C: Secondary | ICD-10-CM

## 2018-02-09 HISTORY — DX: Methicillin resistant Staphylococcus aureus infection, unspecified site: A49.02

## 2018-02-09 HISTORY — DX: Infection and inflammatory reaction due to other internal orthopedic prosthetic devices, implants and grafts, initial encounter: T84.7XXA

## 2018-02-09 HISTORY — DX: Chronic viral hepatitis C: B18.2

## 2018-02-09 NOTE — ED Provider Notes (Signed)
Patient placed in Quick Look pathway, seen and evaluated   Chief Complaint: PICC line, concern for Blood clot  HPI:   Patient presents today from Dr. Lattie Haw office for evaluation of right upper extremity pain, subjective swelling, along with occasional chest tightness.  She has a PICC line in the right upper extremity and is being treated with antibiotics.  She reports that she does not take a deep breath because it causes her to have chest pain.  ROS: No fevers or chills  Physical Exam:   Gen: No distress  Neuro: Awake and Alert  Skin: Warm    Focused Exam: Left arm is slightly swollen over upper posterior arm.  This area is tender to palpation.  PICC line in place without obvious signs of surrounding infection.   Initiation of care has begun. The patient has been counseled on the process, plan, and necessity for staying for the completion/evaluation, and the remainder of the medical screening examination    Norman Clay 02/09/18 1535    Eber Hong, MD 02/16/18 [phone]

## 2018-02-09 NOTE — Telephone Encounter (Signed)
Patient in for her hospital follow up visit, reports ongoing chest pain/tightness for the last 6 days. Her daughter reports some difficulty pushing IV antibiotics/saline flush/heparin locks though PICC.  Patient also states her right shoulder/upper arm feel tight, sore, fatigued like she has "been pitching baseballs all day".  Dr Daiva Eves is concerned about the possibility of a pulmonary embolism, would like stat CT Angiogram along with bloodwork to rule this out. RN called ER nurse station to give them advanced notification that patient would be coming over.  Patient and her daughter had no additional questions, went to the ER via private vehicle (OK per Dr Daiva Eves). Andree Coss, RN

## 2018-02-09 NOTE — ED Notes (Signed)
Pt wants blood drawn from picc line. Blood not drawn in triage, pt going chest xray first.

## 2018-02-09 NOTE — Progress Notes (Signed)
Subjective:   Chief complaint: pleuritic type chest pain   Patient ID: [name], female    DOB: 03-30-1956, 62 y.o.   MRN: [id number]  HPI  [name] is a 62 y.o. female withTransforaminal lumbar interbody fusion L4-5 with screws, cages and rods, local bone graft, Allograft, Vivigen, Decompression L4-5 and L5-S1 who had MR Staph aureus grow from intraop culture. She also has comorbid hepatitis C without hepatic coma and with now prior treatment.  She is complaining of right sided arm pain, swelling and tendrness and pleuritic chest pain that wakes her up at night. She says it is stabbing pain worse with deep breathing. She is able to get some relief with trying to calm down. She has been having these symptoms for weeks.   She says her back pain is improving and that wound is healing up well    Past Medical History:  Diagnosis Date  . Acute hepatitis C without mention of hepatic coma(070.51)   . Anemia   . Arthritis    back  . Carpal tunnel syndrome   . Chest pain, unspecified   . Chronic airway obstruction, not elsewhere classified   . Colitis   . Coronary atherosclerosis of unspecified type of vessel, native or graft   . Disorders of bursae and tendons in shoulder region, unspecified   . GERD (gastroesophageal reflux disease)   . Headache    migraines  . Hepatitis    hx of hepatitis C  . Infective otitis externa, unspecified   . Mixed hyperlipidemia   . Nonspecific abnormal results of thyroid function study   . Other bursitis disorders   . Other symptoms involving nervous and musculoskeletal systems(781.99)   . Pneumonia   . Seizures ([place])    none for 4 years (as of 01/2018) "pseudo seizures"    Past Surgical History:  Procedure Laterality Date  . ABDOMINAL HYSTERECTOMY    . CARDIAC CATHETERIZATION    . CARDIAC CATHETERIZATION N/A 06/25/2016   Procedure: Left Heart Cath and Coronary Angiography;  Surgeon: Dixie Dials, MD;  Location: [facility];   Service: Cardiovascular;  Laterality: N/A;  . CESAREAN SECTION    . CHOLECYSTECTOMY    . COLONOSCOPY    . IR FLUORO GUIDED NEEDLE PLC ASPIRATION/INJECTION LOC  12/27/2017  . LUMBAR FUSION  01/13/2018   Transforaminal lumbar interbody fusion L4-5 with screws, cages and rods, local bone graft, Allograft, Vivigen, Decompression L4-5 and L5-S1  . TONSILLECTOMY      Family History  Problem Relation Age of Onset  . Crohn's disease Daughter 73       colon resection  . Irritable bowel syndrome Daughter   . Colonic polyp Mother   . Thyroid disease Mother   . COPD Mother   . Uterine cancer Maternal Grandmother   . Heart murmur Daughter        SVT  . Colon cancer Neg Hx       Social History   Socioeconomic History  . Marital status: Legally Separated    Spouse name: Not on file  . Number of children: 4  . Years of education: Not on file  . Highest education level: Not on file  Occupational History  . Occupation: care giver  Social Needs  . Financial resource strain: Not on file  . Food insecurity:    Worry: Not on file    Inability: Not on file  . Transportation needs:    Medical: Not on file  Non-medical: Not on file  Tobacco Use  . Smoking status: Former Smoker    Types: Cigarettes    Last attempt to quit: 01/07/2018    Years since quitting: 0.0  . Smokeless tobacco: Never Used  . Tobacco comment: 2 cigarettes a day  Substance and Sexual Activity  . Alcohol use: No  . Drug use: No  . Sexual activity: Yes    Partners: Male  Lifestyle  . Physical activity:    Days per week: Not on file    Minutes per session: Not on file  . Stress: Not on file  Relationships  . Social connections:    Talks on phone: Not on file    Gets together: Not on file    Attends religious service: Not on file    Active member of club or organization: Not on file    Attends meetings of clubs or organizations: Not on file    Relationship status: Not on file  Other Topics Concern  . Not on  file  Social History Narrative  . Not on file    Allergies  Allergen Reactions  . Acetaminophen-Codeine Other (See Comments)    Hand get red, feels like it's on fire  . Morphine And Related     "makes me feel like i'm on fire"  . Azithromycin Itching and Rash     Current Outpatient Medications:  .  aspirin EC 81 MG EC tablet, Take 1 tablet (81 mg total) by mouth daily., Disp: 30 tablet, Rfl: 3 .  aspirin EC 81 MG tablet, Take 1 tablet by mouth daily after breakfast., Disp: , Rfl:  .  docusate sodium (COLACE) 100 MG capsule, Take 1 capsule (100 mg total) by mouth 2 (two) times daily., Disp: 10 capsule, Rfl: 0 .  gabapentin (NEURONTIN) 300 MG capsule, Take 1 capsule (300 mg total) by mouth 3 (three) times daily., Disp: 90 capsule, Rfl: 3 .  ibuprofen (ADVIL,MOTRIN) 400 MG tablet, Take 1 tablet (400 mg total) by mouth every 6 (six) hours as needed for fever., Disp: 30 tablet, Rfl: 0 .  LORazepam (ATIVAN) 1 MG tablet, Take 1 mg by mouth daily as needed for anxiety., Disp: , Rfl:  .  metoprolol tartrate (LOPRESSOR) 25 MG tablet, Take 25 mg by mouth daily at 6 (six) AM., Disp: , Rfl:  .  mupirocin ointment (BACTROBAN) 2 %, Place 1 application into the nose 2 (two) times daily., Disp: 22 g, Rfl: 0 .  nitroGLYCERIN (NITROSTAT) 0.4 MG SL tablet, Place 1 tablet (0.4 mg total) under the tongue every 5 (five) minutes x 3 doses as needed for chest pain. (Patient not taking: Reported on 01/13/2018), Disp: 25 tablet, Rfl: 1 .  Oxycodone HCl 10 MG TABS, Take 1 tablet (10 mg total) by mouth every 4 (four) hours as needed ((score 7 to 10))., Disp: 40 tablet, Rfl: 0 .  pantoprazole (PROTONIX) 40 MG tablet, Take 1 tablet by mouth 2 (two) times daily., Disp: , Rfl:  .  polyethylene glycol (MIRALAX / GLYCOLAX) packet, Take 17 g by mouth daily as needed for mild constipation., Disp: 14 each, Rfl: 0 .  pravastatin (PRAVACHOL) 10 MG tablet, Take 1 tablet by mouth at bedtime., Disp: , Rfl:  .  Vancomycin (VANCOCIN)  750-5 MG/150ML-% SOLN, Inject 150 mLs (750 mg total) into the vein every 12 (twelve) hours., Disp: 56 mL, Rfl: 1 .  vancomycin IVPB, Inject 750 mg into the vein every 12 (twelve) hours. Indication:  osteomyelitis Last Day of  Therapy:  03/09/2018 Labs - Sunday/Monday:  CBC/D, BMP, and vancomycin trough. Labs - Thursday:  BMP and vancomycin trough Labs - Every other week:  ESR and CRP, Disp: 99 Units, Rfl: 0   Review of Systems  Constitutional: Positive for fatigue. Negative for activity change, appetite change, chills, diaphoresis, fever and unexpected weight change.  HENT: Negative for congestion, rhinorrhea, sinus pressure, sneezing, sore throat and trouble swallowing.   Eyes: Negative for photophobia and visual disturbance.  Respiratory: Negative for cough, chest tightness, shortness of breath, wheezing and stridor.   Cardiovascular: Negative for chest pain, palpitations and leg swelling.  Gastrointestinal: Negative for abdominal distention, abdominal pain, anal bleeding, blood in stool, constipation, diarrhea, nausea and vomiting.  Genitourinary: Negative for difficulty urinating, dysuria, flank pain and hematuria.  Musculoskeletal: Positive for arthralgias. Negative for back pain, gait problem, joint swelling and myalgias.  Skin: Positive for wound. Negative for color change, pallor and rash.  Neurological: Negative for dizziness, tremors, weakness and light-headedness.  Hematological: Negative for adenopathy. Does not bruise/bleed easily.  Psychiatric/Behavioral: Negative for agitation, behavioral problems, confusion, decreased concentration, dysphoric mood and sleep disturbance.       Objective:   Physical Exam  Constitutional: She is oriented to person, place, and time. She appears well-developed and well-nourished. She is cooperative. She does not appear ill. No distress.  HENT:  Head: Normocephalic and atraumatic.  Right Ear: Hearing and external ear normal.  Left Ear: Hearing and  external ear normal.  Nose: No rhinorrhea or nasal deformity. No epistaxis.  Eyes: Pupils are equal, round, and reactive to light. Conjunctivae and EOM are normal. Right conjunctiva is not injected. Left conjunctiva is not injected. No scleral icterus.  Neck: Normal range of motion. Neck supple. No JVD present.  Cardiovascular: Normal rate, regular rhythm, S1 normal, S2 normal and normal heart sounds. Exam reveals no friction rub.  No murmur heard. Pulmonary/Chest: Breath sounds normal. No respiratory distress. She has no wheezes. She has no rales.  Abdominal: Soft. Normal appearance and bowel sounds are normal. She exhibits no distension and no ascites. There is no hepatosplenomegaly. There is no tenderness.  Musculoskeletal: Normal range of motion.       Right shoulder: Normal.       Left shoulder: Normal.       Right hip: Normal.       Left hip: Normal.       Right knee: Normal.       Left knee: Normal.       Arms: Lymphadenopathy:       Head (right side): No submandibular, no preauricular and no posterior auricular adenopathy present.       Head (left side): No submandibular, no preauricular and no posterior auricular adenopathy present.    She has no cervical adenopathy.       Right cervical: No superficial cervical and no deep cervical adenopathy present.      Left cervical: No superficial cervical and no deep cervical adenopathy present.  Neurological: She is alert and oriented to person, place, and time. She has normal strength. No sensory deficit. Coordination and gait normal.  Skin: Skin is warm, dry and intact. No abrasion, no bruising, no ecchymosis and no lesion noted. She is not diaphoretic. No cyanosis or erythema. No pallor. Nails show no clubbing.  Psychiatric: She has a normal mood and affect. Her speech is normal and behavior is normal. Judgment and thought content normal. Cognition and memory are normal. She is attentive.  Nursing note and  vitals reviewed.           Assessment & Plan:   Pleuritic type chest pain; while she is with good oxygenation and not tachycardic (I do not know her baseline HR and she had beta blocker on her profile--claims not to be taking this. 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 She has never been treated and woul dlike to cure this via our clinic

## 2018-02-09 NOTE — Progress Notes (Signed)
VASCULAR LAB PRELIMINARY  PRELIMINARY  PRELIMINARY  PRELIMINARY  Right upper extremity venous duplex completed.    Preliminary report:  There is no DVT or SVT noted in the right upper extremity.  Anisah Kuck, RVT 02/09/2018, 4:31 PM

## 2018-02-09 NOTE — ED Triage Notes (Signed)
Pt presents for evaluation of PICC line to L arm with some discomfort. Sent from PCP to rule out blood clot.

## 2018-02-10 NOTE — Discharge Summary (Signed)
Patient ID: [name] MRN: [id number] DOB/AGE: 1955-11-27 62 y.o.  Admit date: 01/13/2018 Discharge date: 02/10/2018  Admission Diagnoses:  Principal Problem:   Anemia due to blood loss, acute Active Problems:   Spinal stenosis of lumbar region   Spondylolisthesis, lumbar region   Fusion of lumbar spine   Infection of lumbar spine [facility])   Discharge Diagnoses:  Principal Problem:   Anemia due to blood loss, acute Active Problems:   Spinal stenosis of lumbar region   Spondylolisthesis, lumbar region   Fusion of lumbar spine   Infection of lumbar spine ([place])  status post Procedure(s): Transforaminal lumbar interbody fusion L4-5 with screws, cages and rods, local bone graft, Allograft, Vivigen, Decompression L4-5 and L5-S1  Past Medical History:  Diagnosis Date  . Acute hepatitis C without mention of hepatic coma(070.51)   . Anemia   . Arthritis    back  . Carpal tunnel syndrome   . Chest pain, unspecified   . Chronic airway obstruction, not elsewhere classified   . Chronic hepatitis C without hepatic coma ([place]) 02/09/2018  . Colitis   . Coronary atherosclerosis of unspecified type of vessel, native or graft   . Disorders of bursae and tendons in shoulder region, unspecified   . GERD (gastroesophageal reflux disease)   . Hardware complicating wound infection ([place]) 02/09/2018  . Headache    migraines  . Hepatitis    hx of hepatitis C  . Infective otitis externa, unspecified   . Mixed hyperlipidemia   . MRSA infection 02/09/2018  . Nonspecific abnormal results of thyroid function study   . Other bursitis disorders   . Other symptoms involving nervous and musculoskeletal systems(781.99)   . Pneumonia   . Seizures ([place])    none for 4 years (as of 01/2018) "pseudo seizures"    Surgeries: Procedure(s): Transforaminal lumbar interbody fusion L4-5 with screws, cages and rods, local bone graft, Allograft, Vivigen, Decompression L4-5 and L5-S1 on 01/13/2018    Consultants: Treatment Team:  Dixie Dials, MD  Discharged Condition: Improved  Hospital Course: [name] is an 62 y.o. female who was admitted 01/13/2018 for operative treatment of Anemia due to blood loss, acute. Patient failed conservative treatments (please see the history and physical for the specifics) and had severe unremitting pain that affects sleep, daily activities and work/hobbies. After pre-op clearance, the patient was taken to the operating room on 01/13/2018 and underwent  Procedure(s): Transforaminal lumbar interbody fusion L4-5 with screws, cages and rods, local bone graft, Allograft, Vivigen, Decompression L4-5 and L5-S1.    Patient was given perioperative antibiotics:  Anti-infectives (From admission, onward)   Start     Dose/Rate Route Frequency Ordered Stop   01/19/18 0000  Vancomycin (VANCOCIN) 750-5 MG/150ML-% SOLN     750 mg 150 mL/hr over 60 Minutes Intravenous Every 12 hours 01/19/18 1417     01/19/18 0000  vancomycin IVPB     750 mg Intravenous Every 12 hours 01/19/18 1532 03/09/18 2359   01/18/18 1100  vancomycin (VANCOCIN) IVPB 750 mg/150 ml premix  Status:  Discontinued     750 mg 150 mL/hr over 60 Minutes Intravenous Every 12 hours 01/17/18 1634 01/19/18 2022   01/14/18 1715  rifampin (RIFADIN) capsule 300 mg  Status:  Discontinued     300 mg Oral Daily 01/14/18 1713 01/17/18 1209   01/14/18 1600  vancomycin (VANCOCIN) 1,250 mg in sodium chloride 0.9 % 250 mL IVPB     1,250 mg 166.7 mL/hr over 90 Minutes Intravenous  Every 24 hours 01/14/18 1510 01/17/18 1819   01/13/18 1630  ceFAZolin (ANCEF) IVPB 1 g/50 mL premix     1 g 100 mL/hr over 30 Minutes Intravenous Every 8 hours 01/13/18 1610 01/14/18 0559   01/13/18 0543  ceFAZolin (ANCEF) IVPB 2g/100 mL premix     2 g 200 mL/hr over 30 Minutes Intravenous On call to O.R. 01/13/18 0543 01/13/18 0745       Patient was given sequential compression devices and early ambulation to prevent DVT.   Patient  benefited maximally from hospital stay and there were no complications. At the time of discharge, the patient was urinating/moving their bowels without difficulty, tolerating a regular diet, pain is controlled with oral pain medications and they have been cleared by PT/OT.   Recent vital signs: No data found.   Recent laboratory studies: No results for input(s): WBC, HGB, HCT, PLT, NA, K, CL, CO2, BUN, CREATININE, GLUCOSE, INR, CALCIUM in the last 72 hours.  Invalid input(s): PT, 2   Discharge Medications:   Allergies as of 01/19/2018      Reactions   Acetaminophen-codeine Other (See Comments)   Hand get red, feels like it's on fire   Morphine And Related    "makes me feel like i'm on fire"   Azithromycin Itching, Rash      Medication List    STOP taking these medications   oxyCODONE 5 MG immediate release tablet Commonly known as:  Oxy IR/ROXICODONE     TAKE these medications   aspirin 81 MG EC tablet Take 1 tablet (81 mg total) by mouth daily.   docusate sodium 100 MG capsule Commonly known as:  COLACE Take 1 capsule (100 mg total) by mouth 2 (two) times daily.   gabapentin 300 MG capsule Commonly known as:  NEURONTIN Take 1 capsule (300 mg total) by mouth 3 (three) times daily.   ibuprofen 400 MG tablet Commonly known as:  ADVIL,MOTRIN Take 1 tablet (400 mg total) by mouth every 6 (six) hours as needed for fever.   LORazepam 1 MG tablet Commonly known as:  ATIVAN Take 1 mg by mouth at bedtime. What changed:  Another medication with the same name was removed. Continue taking this medication, and follow the directions you see here.   mupirocin ointment 2 % Commonly known as:  BACTROBAN Place 1 application into the nose 2 (two) times daily.   nitroGLYCERIN 0.4 MG SL tablet Commonly known as:  NITROSTAT Place 1 tablet (0.4 mg total) under the tongue every 5 (five) minutes x 3 doses as needed for chest pain. What changed:  Another medication with the same name was  removed. Continue taking this medication, and follow the directions you see here.   Vancomycin 750-5 MG/150ML-% Soln Commonly known as:  VANCOCIN Inject 150 mLs (750 mg total) into the vein every 12 (twelve) hours.   vancomycin IVPB Inject 750 mg into the vein every 12 (twelve) hours. Indication:  osteomyelitis Last Day of Therapy:  03/09/2018 Labs - Sunday/Monday:  CBC/D, BMP, and vancomycin trough. Labs - Thursday:  BMP and vancomycin trough Labs - Every other week:  ESR and CRP     ASK your doctor about these medications   oxyCODONE 10 mg 12 hr tablet Commonly known as:  OXYCONTIN Take 3 tablets (30 mg total) by mouth every 12 (twelve) hours for 14 days. Ask about: Should I take this medication?            Home Infusion Instuctions  (From  admission, onward)        Start     Ordered   01/19/18 0000  Home infusion instructions [facility] May follow [place] Dosing Protocol; May administer Cathflo as needed to maintain patency of vascular access device.; Flushing of vascular access device: per [facility] Protocol: 0.9% NaCl pre/post medica...    Question Answer Comment  Instructions May follow [place] Dosing Protocol   Instructions May administer Cathflo as needed to maintain patency of vascular access device.   Instructions Flushing of vascular access device: per [facility] Protocol: 0.9% NaCl pre/post medication administration and prn patency; Heparin 100 u/ml, 71m for implanted ports and Heparin 10u/ml, 540mfor all other central venous catheters.   Instructions May follow [facility] Anaphylaxis Protocol for First Dose Administration in the home: 0.9% NaCl at 25-50 ml/hr to maintain IV access for protocol meds. Epinephrine 0.3 ml IV/IM PRN and Benadryl 25-50 IV/IM PRN s/s of anaphylaxis.   Instructions [facility] Infusion Coordinator (RN) to assist per patient IV care needs in the home PRN.      01/19/18 1532      Diagnostic Studies: Dg Chest 2 View  Result Date:  02/09/2018 CLINICAL DATA:  [age]/o F; chest tightness with concern for right upper extremity DVT/PE. EXAM: CHEST - 2 VIEW COMPARISON:  01/14/2018 chest radiograph FINDINGS: Normal cardiac silhouette given projection and technique. Calcific atherosclerosis of aorta. No focal consolidation, pleural effusion, or pneumothorax. Right PICC line catheter tip projects over lower SVC. No acute osseous abnormality is evident. Right upper quadrant cholecystectomy clips. IMPRESSION: No acute pulmonary process identified. Electronically Signed   By: LaKristine Garbe.D.   On: 02/09/2018 16:00   Dg Lumbar Spine Complete  Result Date: 01/13/2018 CLINICAL DATA:  Lumbar fusion at L4-5 EXAM: LUMBAR SPINE - COMPLETE 4+ VIEW; DG C-ARM 61-120 MIN COMPARISON:  12/25/2017 FLUOROSCOPY TIME:  Radiation Exposure Index (as provided by the fluoroscopic device): 32.74 mGy If the device does not provide the exposure index: Fluoroscopy Time:  77 seconds Number of Acquired Images:  4 FINDINGS: Initial frontal images demonstrate pedicle screws at L4 and L5 with interbody fusion new from the prior MRI examination. Surgical instrument is noted just to the right of the midline. The lateral images demonstrate similar pedicle screws with interbody fusion. IMPRESSION: L4-5 fusion. Electronically Signed   By: MaInez Catalina.D.   On: 01/13/2018 13:54   [place]  Result Date: 01/16/2018 CLINICAL DATA:  Left renal cyst EXAM: RENAL / URINARY TRACT ULTRASOUND COMPLETE COMPARISON:  CT abdomen and pelvis October 12, 2013 FINDINGS: Right Kidney: Length: 11.1 cm. Echogenicity and renal cortical thickness are within normal limits. No mass, perinephric fluid, or hydronephrosis visualized. No sonographically demonstrable calculus or ureterectasis. Left Kidney: Length: 10.8 cm. Echogenicity and renal cortical thickness are within normal limits. No perinephric fluid or hydronephrosis visualized. There is a cyst arising at the upper to mid portion of the  left kidney measuring 2.5 x 2.6 x 2.6 cm. No other mass evident. No sonographically demonstrable calculus or ureterectasis. Bladder: Appears normal for degree of bladder distention. IMPRESSION: Cyst in left kidney, marginally larger than on 2014 study. Study otherwise unremarkable. Electronically Signed   By: WiLowella GripII M.D.   On: 01/16/2018 09:41   Dg Chest Port 1 View  Result Date: 01/14/2018 CLINICAL DATA:  Chest pain at rest. EXAM: PORTABLE CHEST 1 VIEW COMPARISON:  Two-view chest x-ray 12/28/2017 FINDINGS: The patient is significantly rotated to the right. Heart size is  normal. There is no edema or effusion. No focal airspace disease is present. The visualized soft tissues and bony thorax are unremarkable. IMPRESSION: Negative one-view chest x-ray Electronically Signed   By: San Morelle M.D.   On: 01/14/2018 15:10   Dg C-arm 1-60 Min  Result Date: 01/13/2018 CLINICAL DATA:  Lumbar fusion at L4-5 EXAM: LUMBAR SPINE - COMPLETE 4+ VIEW; DG C-ARM 61-120 MIN COMPARISON:  12/25/2017 FLUOROSCOPY TIME:  Radiation Exposure Index (as provided by the fluoroscopic device): 32.74 mGy If the device does not provide the exposure index: Fluoroscopy Time:  77 seconds Number of Acquired Images:  4 FINDINGS: Initial frontal images demonstrate pedicle screws at L4 and L5 with interbody fusion new from the prior MRI examination. Surgical instrument is noted just to the right of the midline. The lateral images demonstrate similar pedicle screws with interbody fusion. IMPRESSION: L4-5 fusion. Electronically Signed   By: Inez Catalina M.D.   On: 01/13/2018 13:54   Dg C-arm 1-60 Min  Result Date: 01/13/2018 CLINICAL DATA:  Lumbar fusion at L4-5 EXAM: LUMBAR SPINE - COMPLETE 4+ VIEW; DG C-ARM 61-120 MIN COMPARISON:  12/25/2017 FLUOROSCOPY TIME:  Radiation Exposure Index (as provided by the fluoroscopic device): 32.74 mGy If the device does not provide the exposure index: Fluoroscopy Time:  77 seconds Number  of Acquired Images:  4 FINDINGS: Initial frontal images demonstrate pedicle screws at L4 and L5 with interbody fusion new from the prior MRI examination. Surgical instrument is noted just to the right of the midline. The lateral images demonstrate similar pedicle screws with interbody fusion. IMPRESSION: L4-5 fusion. Electronically Signed   By: Inez Catalina M.D.   On: 01/13/2018 13:54   Dg C-arm 1-60 Min  Result Date: 01/13/2018 CLINICAL DATA:  Lumbar fusion at L4-5 EXAM: LUMBAR SPINE - COMPLETE 4+ VIEW; DG C-ARM 61-120 MIN COMPARISON:  12/25/2017 FLUOROSCOPY TIME:  Radiation Exposure Index (as provided by the fluoroscopic device): 32.74 mGy If the device does not provide the exposure index: Fluoroscopy Time:  77 seconds Number of Acquired Images:  4 FINDINGS: Initial frontal images demonstrate pedicle screws at L4 and L5 with interbody fusion new from the prior MRI examination. Surgical instrument is noted just to the right of the midline. The lateral images demonstrate similar pedicle screws with interbody fusion. IMPRESSION: L4-5 fusion. Electronically Signed   By: Inez Catalina M.D.   On: 01/13/2018 13:54   Xr Lumbar Spine 2-3 Views  Result Date: 01/26/2018 AP and lateral radiographs of the lumbar spine show the pedicle screws and rods and interbody cage at the L4-5 level. The screws and rods are in good position and alignment.  No abnormality at the L4-5 fusion level.  [place] Ekg Site Rite  Result Date: 01/18/2018 If Site Rite image not attached, placement could not be confirmed due to current cardiac rhythm.   Discharge Instructions    Call MD / Call 911   Complete by:  As directed    If you experience chest pain or shortness of breath, CALL 911 and be transported to the hospital emergency room.  If you develope a fever above 101 F, pus (white drainage) or increased drainage or redness at the wound, or calf pain, call your surgeon's office.   Constipation Prevention   Complete by:  As directed     Drink plenty of fluids.  Prune juice may be helpful.  You may use a stool softener, such as Colace (over the counter) 100 mg twice a day.  Use MiraLax (  over the counter) for constipation as needed.   Diet - low sodium heart healthy   Complete by:  As directed    Discharge instructions   Complete by:  As directed    Call if there is increasing drainage, fever greater than 101.5, severe head aches, and worsening nausea or light sensitivity. If shortness of breath, bloody cough or chest tightness or pain go to an emergency room. No lifting greater than 10 lbs. Avoid bending, stooping and twisting. Use brace when sitting and out of bed even to go to bathroom. Walk in house for first 2 weeks then may start to get out slowly increasing distances up to one mile by 4-6 weeks post op. After 5 days may shower and change dressing following bathing with shower.When bathing remove the brace shower and replace brace before getting out of the shower. If drainage, keep dry dressing and do not bathe the incision, use an moisture impervious dressing. Please call and return for scheduled follow up appointment 2 weeks from the time of surgery. Return for a follow up appointment with Dr. Louanne Skye in one week. HHN (home health nursing) to provide for IV antibiotic, PICCU line care and for vancomycin pharmacy trough and [facility]. For 8 weeks.   Driving restrictions   Complete by:  As directed    No driving for 8 weeks   Home infusion instructions [facility] May follow [place] Dosing Protocol; May administer Cathflo as needed to maintain patency of vascular access device.; Flushing of vascular access device: per [facility] Protocol: 0.9% NaCl pre/post medica...   Complete by:  As directed    Instructions:  May follow [place] Dosing Protocol   Instructions:  May administer Cathflo as needed to maintain patency of vascular access device.   Instructions:  Flushing of vascular access device: per [facility] Protocol:  0.9% NaCl pre/post medication administration and prn patency; Heparin 100 u/ml, 62m for implanted ports and Heparin 10u/ml, 547mfor all other central venous catheters.   Instructions:  May follow [facility] Anaphylaxis Protocol for First Dose Administration in the home: 0.9% NaCl at 25-50 ml/hr to maintain IV access for protocol meds. Epinephrine 0.3 ml IV/IM PRN and Benadryl 25-50 IV/IM PRN s/s of anaphylaxis.   Instructions:  [facility] Coordinator (RN) to assist per patient IV care needs in the home PRN.   Increase activity slowly as tolerated   Complete by:  As directed    Lifting restrictions   Complete by:  As directed    No lifting for 8 weeks      Follow-up Information    NiJessy OtoMD In 2 weeks.   Specialty:  Orthopedic Surgery Why:  For wound re-check Contact information: [id number]   Specialty:  Home Health Services Why:  Nurse, physical and occupational therapist to follow up with you at home. Contact information: [id number] [phone]      Schedule an appointment as soon as possible for a visit with HaCampbell RichesMD.   Specialty:  Infectious Diseases Contact information: [address] [phone]         Discharge Plan:  discharge to home  Disposition:     Signed: JaBenjiman Core4/30/2019, 11:23 AM

## 2018-02-11 ENCOUNTER — Other Ambulatory Visit (INDEPENDENT_AMBULATORY_CARE_PROVIDER_SITE_OTHER): Payer: Self-pay | Admitting: Specialist

## 2018-02-11 NOTE — Telephone Encounter (Signed)
Pt called would like Dr.Nitka to know she will need at least three more pills due to the long wait in ER

## 2018-02-11 NOTE — Telephone Encounter (Signed)
Refill Rx Oxycodone Patient call back [phone]

## 2018-02-11 NOTE — Telephone Encounter (Signed)
Sent request to Dr. Nitka 

## 2018-02-12 ENCOUNTER — Encounter: Payer: Self-pay | Admitting: Infectious Disease

## 2018-02-12 MED ORDER — OXYCODONE HCL 10 MG PO TABS: 10.0000 mg | ORAL_TABLET | ORAL | 0 refills | Status: DC | PRN

## 2018-02-12 NOTE — Telephone Encounter (Signed)
Med refill  Oxycodone pt is running out wanted to know if [name] could fill prescription

## 2018-02-12 NOTE — Telephone Encounter (Signed)
Sent to Dr Otelia Sergeant already

## 2018-02-13 ENCOUNTER — Other Ambulatory Visit: Payer: Self-pay | Admitting: Pharmacist

## 2018-02-13 NOTE — Telephone Encounter (Signed)
Patient was advised Rx is ready for pickup at front desk.

## 2018-02-18 ENCOUNTER — Encounter (INDEPENDENT_AMBULATORY_CARE_PROVIDER_SITE_OTHER): Payer: Self-pay | Admitting: Surgery

## 2018-02-18 ENCOUNTER — Ambulatory Visit (INDEPENDENT_AMBULATORY_CARE_PROVIDER_SITE_OTHER): Payer: Self-pay | Admitting: Surgery

## 2018-02-18 VITALS — BP 128/66 | HR 73 | Temp 97.0°F

## 2018-02-18 DIAGNOSIS — M4326 Fusion of spine, lumbar region: Secondary | ICD-10-CM

## 2018-02-18 MED ORDER — OXYCODONE HCL 10 MG PO TABS: 10.0000 mg | ORAL_TABLET | Freq: Four times a day (QID) | ORAL | 0 refills | Status: DC | PRN

## 2018-02-18 NOTE — Progress Notes (Signed)
Post-Op Visit Note   Patient: [name]           Date of Birth: [birth year]           MRN: [id number] Visit Date: 02/18/2018 PCP: Orpah Cobb, MD   Assessment & Plan:  Chief Complaint:  Chief Complaint  Patient presents with  . Lower Back - Follow-up, Pain  Patient returns for recheck.  She is doing well.  Preop leg pain is much better.  She still has her PICC line and is scheduled to see Dr. Ninetta Lights weeks.  About to run out of her pain medication over the weekend is asking about another refill. Visit Diagnoses:  1. Fusion of lumbar spine     Plan: Advised patient that she must continue wearing her lumbar brace until return office visit with Dr. Otelia Sergeant.  Avoid twisting bending lifting.  Refilled oxycodone prescription.  Has appointment with Dr. Ninetta Lights infectious disease physician couple weeks to discuss removal of PICC line.  Follow-Up Instructions: Return in about 6 weeks (around 04/01/2018) for with dr Otelia Sergeant.   Orders:  No orders of the defined types were placed in this encounter.  Meds ordered this encounter  Medications  . Oxycodone HCl 10 MG TABS    Sig: Take 1 tablet (10 mg total) by mouth every 6 (six) hours as needed ((score 7 to 10)).    Dispense:  40 tablet    Refill:  0    Patient is receiving for post operative pain control intermittant breakthrough pain meds 10 mg every 3-4 hours in addition to 30 mg of oxycontin every 12 hours.    Imaging: No results found.  PMFS History: Patient Active Problem List   Diagnosis Date Noted  . Chronic hepatitis C without hepatic coma (HCC) 02/09/2018  . Hardware complicating wound infection ([facility]CC) 02/09/2018  . MRSA infection 02/09/2018  . Anemia due to blood loss, acute 01/16/2018    Class: Acute  . Infection of lumbar spine (HCC) 01/16/2018    Class: Acute  . Spinal stenosis of lumbar region 01/13/2018    Class: Chronic  . Spondylolisthesis, lumbar region 01/13/2018    Class: Chronic  . Fusion of lumbar spine  01/13/2018  . Low back pain 12/23/2017  . Tobacco use disorder 06/24/2016  . Weight gain 01/02/2012  . Stool incontinence 11/19/2011  . Abdominal pain, right lower quadrant 11/19/2011  . EUSTACHIAN TUBE DYSFUNCTION 08/10/2009  . SINUSITIS, ACUTE 08/10/2009  . TRANSIENT GLOBAL AMNESIA 01/12/2009  . HIP PAIN, RIGHT 10/04/2008  . HEPATITIS C 07/28/2008  . COPD, 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  . HYPERLIPIDEMIA, MIXED, WITH LOW HDL 08/30/2007  . OTITIS EXTERNA, INFECTIVE NOS 08/27/2007  . ROTATOR CUFF SYNDROME, RIGHT 08/27/2007  . Chest pain at rest 08/27/2007  . BURSITIS NOS 07/13/2007  . MUSCULOSKELETAL PAIN 07/13/2007  . CARPAL TUNNEL SYNDROME, BILATERAL 04/10/2007   Past Medical History:  Diagnosis Date  . Acute hepatitis C without mention of hepatic coma(070.51)   . Anemia   . Arthritis    back  . Carpal tunnel syndrome   . Chest pain, unspecified   . Chronic airway obstruction, not elsewhere classified   . Chronic hepatitis C without hepatic coma (HCC) 02/09/2018  . Colitis   . Coronary atherosclerosis of unspecified type of vessel, native or graft   . Disorders of bursae and tendons in shoulder region, unspecified   . GERD (gastroesophageal reflux disease)   . Hardware complicating wound infection (HCC) 02/09/2018  . Headache    migraines  .  Hepatitis    hx of hepatitis C  . Infective otitis externa, unspecified   . Mixed hyperlipidemia   . MRSA infection 02/09/2018  . Nonspecific abnormal results of thyroid function study   . Other bursitis disorders   . Other symptoms involving nervous and musculoskeletal systems(781.99)   . Pneumonia   . Seizures (HCC)    none for 4 years (as of 01/2018) "pseudo seizures"    Family History  Problem Relation Age of Onset  . Crohn's disease Daughter 39       colon resection  . Irritable bowel syndrome Daughter   . Colonic polyp Mother   . Thyroid disease Mother   . COPD Mother   . Uterine cancer Maternal Grandmother   . Heart murmur Daughter         SVT  . Colon cancer Neg Hx     Past Surgical History:  Procedure Laterality Date  . ABDOMINAL HYSTERECTOMY    . CARDIAC CATHETERIZATION    . CARDIAC CATHETERIZATION N/A 06/25/2016   Procedure: Left Heart Cath and Coronary Angiography;  Surgeon: Orpah Cobb, MD;  Location: [facility] INVASIVE CV LAB;  Service: Cardiovascular;  Laterality: N/A;  . CESAREAN SECTION    . CHOLECYSTECTOMY    . COLONOSCOPY    . IR FLUORO GUIDED NEEDLE PLC ASPIRATION/INJECTION LOC  12/27/2017  . LUMBAR FUSION  01/13/2018   Transforaminal lumbar interbody fusion L4-5 with screws, cages and rods, local bone graft, Allograft, Vivigen, Decompression L4-5 and L5-S1  . TONSILLECTOMY     Social History   Occupational History  . Occupation: care giver  Tobacco Use  . Smoking status: Former Smoker    Types: Cigarettes    Last attempt to quit: 01/07/2018    Years since quitting: 0.1  . Smokeless tobacco: Never Used  . Tobacco comment: 2 cigarettes a day  Substance and Sexual Activity  . Alcohol use: No  . Drug use: No  . Sexual activity: Yes    Partners: Male   Exam Very pleasant white female alert and oriented in no acute distress.  Surgical incision looks good.  No drainage or signs of infection.  Neurovascular intact.  No focal motor deficits.  Bilateral calves nontender.

## 2018-02-20 ENCOUNTER — Other Ambulatory Visit: Payer: Self-pay | Admitting: Pharmacist

## 2018-02-20 NOTE — Progress Notes (Signed)
OPAT lab monitoring 

## 2018-02-25 ENCOUNTER — Other Ambulatory Visit (INDEPENDENT_AMBULATORY_CARE_PROVIDER_SITE_OTHER): Payer: Self-pay | Admitting: Specialist

## 2018-02-25 LAB — ACID FAST CULTURE WITH REFLEXED SENSITIVITIES: ACID FAST CULTURE - AFSCU3: NEGATIVE

## 2018-02-25 LAB — ACID FAST CULTURE WITH REFLEXED SENSITIVITIES (MYCOBACTERIA)

## 2018-02-25 NOTE — Telephone Encounter (Signed)
No further oxycodone with be prescribed. It is now 6 weeks following lumbar surgery and it is time to stop narcotics.

## 2018-02-25 NOTE — Telephone Encounter (Signed)
Patient called for refill for Oxycodone, Stated she knows she is not due until Saturday for refill but wanted to get script by Friday so she can pick it up this weekend.  Please call patient to advise.

## 2018-02-25 NOTE — Telephone Encounter (Signed)
Sent request to Dr. Nitka 

## 2018-02-26 ENCOUNTER — Other Ambulatory Visit: Payer: Self-pay

## 2018-02-26 ENCOUNTER — Emergency Department (HOSPITAL_COMMUNITY)
Admission: EM | Admit: 2018-02-26 | Discharge: 2018-02-26 | Disposition: A | Discharge status: Home / Self Care | Payer: Self-pay | Attending: Emergency Medicine | Admitting: Emergency Medicine

## 2018-02-26 ENCOUNTER — Encounter (HOSPITAL_COMMUNITY): Payer: Self-pay | Admitting: Emergency Medicine

## 2018-02-26 DIAGNOSIS — Z0189 Encounter for other specified special examinations: Secondary | ICD-10-CM | POA: Insufficient documentation

## 2018-02-26 DIAGNOSIS — I2510 Atherosclerotic heart disease of native coronary artery without angina pectoris: Secondary | ICD-10-CM | POA: Insufficient documentation

## 2018-02-26 DIAGNOSIS — Z79899 Other long term (current) drug therapy: Secondary | ICD-10-CM | POA: Insufficient documentation

## 2018-02-26 DIAGNOSIS — Z87891 Personal history of nicotine dependence: Secondary | ICD-10-CM | POA: Insufficient documentation

## 2018-02-26 DIAGNOSIS — J449 Chronic obstructive pulmonary disease, unspecified: Secondary | ICD-10-CM | POA: Insufficient documentation

## 2018-02-26 LAB — CBC
HCT: 42.9 % (ref 36.0–46.0)
Hemoglobin: 13.6 g/dL (ref 12.0–15.0)
MCH: 28.9 pg (ref 26.0–34.0)
MCHC: 31.7 g/dL (ref 30.0–36.0)
MCV: 91.3 fL (ref 78.0–100.0)
PLATELETS: 339 10*3/uL (ref 150–400)
RBC: 4.7 MIL/uL (ref 3.87–5.11)
RDW: 13.9 % (ref 11.5–15.5)
WBC: 8.8 10*3/uL (ref 4.0–10.5)

## 2018-02-26 LAB — COMPREHENSIVE METABOLIC PANEL
ALBUMIN: 4.1 g/dL (ref 3.5–5.0)
ALT: 18 U/L (ref 14–54)
AST: 23 U/L (ref 15–41)
Alkaline Phosphatase: 85 U/L (ref 38–126)
Anion gap: 10 (ref 5–15)
BUN: 8 mg/dL (ref 6–20)
CHLORIDE: 106 mmol/L (ref 101–111)
CO2: 23 mmol/L (ref 22–32)
CREATININE: 0.65 mg/dL (ref 0.44–1.00)
Calcium: 9.3 mg/dL (ref 8.9–10.3)
GFR calc Af Amer: >60 mL/min (ref >60)
GFR calc non Af Amer: >60 mL/min (ref >60)
GLUCOSE: 133 mg/dL — AB (ref 65–99)
Potassium: 4.1 mmol/L (ref 3.5–5.1)
SODIUM: 139 mmol/L (ref 135–145)
Total Bilirubin: 0.4 mg/dL (ref 0.3–1.2)
Total Protein: 6.7 g/dL (ref 6.5–8.1)

## 2018-02-26 MED ORDER — OXYCODONE HCL 5 MG PO TABS
10.0000 mg | ORAL_TABLET | Freq: Once | ORAL | Status: AC
  Administered 2018-02-26: 10 mg via ORAL
  Filled 2018-02-26: qty 2

## 2018-02-26 NOTE — Telephone Encounter (Signed)
I called and lmom advised patient that this rx was denied.

## 2018-02-26 NOTE — ED Triage Notes (Signed)
Pt sent by Dr. Ninetta Lights for low potassium level (3.0). Has PICC for IV vanc., pt has no complaints at this time.

## 2018-02-27 ENCOUNTER — Telehealth: Payer: Self-pay | Admitting: *Deleted

## 2018-02-27 ENCOUNTER — Other Ambulatory Visit: Payer: Self-pay | Admitting: Pharmacist

## 2018-02-27 ENCOUNTER — Telehealth (INDEPENDENT_AMBULATORY_CARE_PROVIDER_SITE_OTHER): Payer: Self-pay | Admitting: Specialist

## 2018-02-27 NOTE — Telephone Encounter (Signed)
Patient is calling to see when her follow up should be scheduled with Dr Daiva Eves. At her last office visit 4/29, she went directly to the ER to rule out clot/PE; she did not make a follow up appointment.  She was sent to the ER 5/17 for potassium=3.0 ([facility] labs), but it was found to be 4.0 on redraw. She is scheduled to finish her vancomycin 5/27.

## 2018-02-27 NOTE — Telephone Encounter (Signed)
I called and advised that I have sent her message to James, Dr. Nitka's PA, and that we are waiting for him to respond, however he is seeing patients, as soon as he advises that I would call her back.  She states that she has 3 tablets left of the oxycodone, and she can not take tramadol, she states that "it makes me shake like a leaf on a tree". 

## 2018-02-27 NOTE — Telephone Encounter (Signed)
Patient called asking why she wouldn't be prescribed anymore pain medication? She asked if she was suppose to be weaned off or just stop taking it abruptly? Worried about withdraws. CB # [phone]

## 2018-02-27 NOTE — Telephone Encounter (Signed)
Patient wants to know what to expect from not taking the Oxycodone.  She also wants to know if she will receive any other medication for pain.  CB#[phone].  Thank you.

## 2018-02-27 NOTE — Progress Notes (Signed)
[facility] pharmacy lab review  

## 2018-02-27 NOTE — Telephone Encounter (Signed)
Dr. Otelia Sergeant denied her the Oxycodone,  Please see message from her and advise ---------Patient called asking why she wouldn't be prescribed anymore pain medication? She asked if she was suppose to be weaned off or just stop taking it abruptly? Worried about withdraws.

## 2018-02-27 NOTE — Telephone Encounter (Signed)
I called and advised that I have sent her message to Fayrene Fearing, Dr. Barbaraann Faster PA, and that we are waiting for him to respond, however he is seeing patients, as soon as he advises that I would call her back.  She states that she has 3 tablets left of the oxycodone, and she can not take tramadol, she states that "it makes me shake like a leaf on a tree".

## 2018-02-27 NOTE — Telephone Encounter (Signed)
Continue what she has until nitka comes back.

## 2018-02-27 NOTE — Telephone Encounter (Signed)
If we can get her in. OK with NP as well before finishng IV abx she will need protracted orals afterwards

## 2018-02-27 NOTE — ED Provider Notes (Signed)
[facility] EMERGENCY DEPARTMENT Provider Note   CSN: [id number] Arrival date & time: 02/26/18  1620     History   Chief Complaint Chief Complaint  Patient presents with  . Abnormal Lab    HPI [name] is a 62 y.o. female.  62 year old female with extensive past medical history below including back surgery complicated by wound infection and currently on IV vancomycin who presents with abnormal lab work.  The patient was contacted by Dr. Johnnye Sima today because her potassium was found to be low at 3.0.  He instructed her to report to the ER for evaluation.  She denies any complaints currently.  No vomiting, diarrhea, fevers, or problems eating/drinking.  She has been compliant with medications.  The history is provided by the patient.  Abnormal Lab    Past Medical History:  Diagnosis Date  . Acute hepatitis C without mention of hepatic coma(070.51)   . Anemia   . Arthritis    back  . Carpal tunnel syndrome   . Chest pain, unspecified   . Chronic airway obstruction, not elsewhere classified   . Chronic hepatitis C without hepatic coma ([place]) 02/09/2018  . Colitis   . Coronary atherosclerosis of unspecified type of vessel, native or graft   . Disorders of bursae and tendons in shoulder region, unspecified   . GERD (gastroesophageal reflux disease)   . Hardware complicating wound infection ([place]) 02/09/2018  . Headache    migraines  . Hepatitis    hx of hepatitis C  . Infective otitis externa, unspecified   . Mixed hyperlipidemia   . MRSA infection 02/09/2018  . Nonspecific abnormal results of thyroid function study   . Other bursitis disorders   . Other symptoms involving nervous and musculoskeletal systems(781.99)   . Pneumonia   . Seizures ([place])    none for 4 years (as of 01/2018) "pseudo seizures"    Patient Active Problem List   Diagnosis Date Noted  . Chronic hepatitis C without hepatic coma ([place]) 02/09/2018  . Hardware complicating wound  infection ([place]) 02/09/2018  . MRSA infection 02/09/2018  . Anemia due to blood loss, acute 01/16/2018    Class: Acute  . Infection of lumbar spine ([place]) 01/16/2018    Class: Acute  . Spinal stenosis of lumbar region 01/13/2018    Class: Chronic  . Spondylolisthesis, lumbar region 01/13/2018    Class: Chronic  . Fusion of lumbar spine 01/13/2018  . Low back pain 12/23/2017  . Tobacco use disorder 06/24/2016  . Weight gain 01/02/2012  . Stool incontinence 11/19/2011  . Abdominal pain, right lower quadrant 11/19/2011  . EUSTACHIAN TUBE DYSFUNCTION 08/10/2009  . SINUSITIS, ACUTE 08/10/2009  . TRANSIENT GLOBAL AMNESIA 01/12/2009  . HIP PAIN, RIGHT 10/04/2008  . HEPATITIS C 07/28/2008  . COPD, 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  . HYPERLIPIDEMIA, MIXED, WITH LOW HDL 08/30/2007  . OTITIS EXTERNA, INFECTIVE NOS 08/27/2007  . ROTATOR CUFF SYNDROME, RIGHT 08/27/2007  . Chest pain at rest 08/27/2007  . BURSITIS NOS 07/13/2007  . MUSCULOSKELETAL PAIN 07/13/2007  . CARPAL TUNNEL SYNDROME, BILATERAL 04/10/2007    Past Surgical History:  Procedure Laterality Date  . ABDOMINAL HYSTERECTOMY    . CARDIAC CATHETERIZATION    . CARDIAC CATHETERIZATION N/A 06/25/2016   Procedure: Left Heart Cath and Coronary Angiography;  Surgeon: Dixie Dials, MD;  Location: [facility];  Service: Cardiovascular;  Laterality: N/A;  . CESAREAN SECTION    . CHOLECYSTECTOMY    . COLONOSCOPY    .  IR FLUORO GUIDED NEEDLE PLC ASPIRATION/INJECTION LOC  12/27/2017  . LUMBAR FUSION  01/13/2018   Transforaminal lumbar interbody fusion L4-5 with screws, cages and rods, local bone graft, Allograft, Vivigen, Decompression L4-5 and L5-S1  . TONSILLECTOMY       OB History   None      Home Medications    Prior to Admission medications   Medication Sig Start Date End Date Taking? Authorizing Provider  aspirin EC 81 MG EC tablet Take 1 tablet (81 mg total) by mouth daily. Patient not taking: Reported on 02/09/2018 01/20/18   Jessy Oto, MD  docusate sodium (COLACE) 100 MG capsule Take 1 capsule (100 mg total) by mouth 2 (two) times daily. Patient not taking: Reported on 02/09/2018 01/19/18   Jessy Oto, MD  gabapentin (NEURONTIN) 300 MG capsule Take 1 capsule (300 mg total) by mouth 3 (three) times daily. 01/19/18   Jessy Oto, MD  ibuprofen (ADVIL,MOTRIN) 400 MG tablet Take 1 tablet (400 mg total) by mouth every 6 (six) hours as needed for fever. Patient not taking: Reported on 02/09/2018 01/19/18   Jessy Oto, MD  LORazepam (ATIVAN) 1 MG tablet Take 1 mg by mouth at bedtime.     [provider]  mupirocin ointment (BACTROBAN) 2 % Place 1 application into the nose 2 (two) times daily. Patient not taking: Reported on 02/09/2018 01/19/18   Jessy Oto, MD  nitroGLYCERIN (NITROSTAT) 0.4 MG SL tablet Place 1 tablet (0.4 mg total) under the tongue every 5 (five) minutes x 3 doses as needed for chest pain. 06/25/16   Dixie Dials, MD  Oxycodone HCl 10 MG TABS Take 1 tablet (10 mg total) by mouth every 6 (six) hours as needed ((score 7 to 10)). 02/18/18   Lanae Crumbly, PA-C  sertraline (ZOLOFT) 50 MG tablet Take 50 mg by mouth daily.    [provider]  Vancomycin (VANCOCIN) 750-5 MG/150ML-% SOLN Inject 150 mLs (750 mg total) into the vein every 12 (twelve) hours. 01/19/18   Jessy Oto, MD  vancomycin IVPB Inject 750 mg into the vein every 12 (twelve) hours. Indication:  osteomyelitis Last Day of Therapy:  03/09/2018 Labs - Sunday/Monday:  CBC/D, BMP, and vancomycin trough. Labs - Thursday:  BMP and vancomycin trough Labs - Every other week:  ESR and CRP Patient not taking: Reported on 02/09/2018 01/19/18 03/09/18  Jessy Oto, MD    Family History Family History  Problem Relation Age of Onset  . Crohn's disease Daughter 20       colon resection  . Irritable bowel syndrome Daughter   . Colonic polyp Mother   . Thyroid disease Mother   . COPD Mother   . Uterine cancer Maternal Grandmother   .  Heart murmur Daughter        SVT  . Colon cancer Neg Hx     Social History Social History   Tobacco Use  . Smoking status: Former Smoker    Types: Cigarettes    Last attempt to quit: 01/07/2018    Years since quitting: 0.1  . Smokeless tobacco: Never Used  . Tobacco comment: 2 cigarettes a day  Substance Use Topics  . Alcohol use: No  . Drug use: No     Allergies   Acetaminophen-codeine; Morphine and related; and Azithromycin   Review of Systems Review of Systems All other systems reviewed and are negative except that which was mentioned in HPI   Physical Exam Updated  Vital Signs BP 132/69   Pulse 67   Temp 98.1 F (36.7 C) (Oral)   Resp 19   SpO2 97%   Physical Exam  Constitutional: She is oriented to person, place, and time. She appears well-developed and well-nourished. No distress.  HENT:  Head: Normocephalic and atraumatic.  Moist mucous membranes  Eyes: Conjunctivae are normal.  Neck: Neck supple.  Cardiovascular: Normal rate, regular rhythm and normal heart sounds.  No murmur heard. Pulmonary/Chest: Effort normal and breath sounds normal.  Abdominal: Soft. Bowel sounds are normal. She exhibits no distension. There is no tenderness.  Neurological: She is alert and oriented to person, place, and time.  Fluent speech  Skin: Skin is warm and dry.  Psychiatric: She has a normal mood and affect. Judgment normal.  Nursing note and vitals reviewed.    ED Treatments / Results  Labs (all labs ordered are listed, but only abnormal results are displayed) Labs Reviewed  COMPREHENSIVE METABOLIC PANEL - Abnormal; Notable for the following components:      Result Value   Glucose, Bld 133 (*)    All other components within normal limits  CBC    EKG None  Radiology No results found.  Procedures Procedures (including critical care time)  Medications Ordered in ED Medications  oxyCODONE (Oxy IR/ROXICODONE) immediate release tablet 10 mg (10 mg Oral  Given 02/26/18 1952)     Initial Impression / Assessment and Plan / ED Course  I have reviewed the triage vital signs and the nursing notes.  Pertinent labs that were available during my care of the patient were reviewed by me and considered in my medical decision making (see chart for details).     Labs here show K 4.1. Pt denying complaints. Discussed return precautions.  Final Clinical Impressions(s) / ED Diagnoses   Final diagnoses:  Encounter for laboratory test    ED Discharge Orders    None       Fontella Shan, Wenda Overland, MD 02/27/18 0120

## 2018-02-27 NOTE — Telephone Encounter (Signed)
Patient called stated received message about NOT giving her anymore pain meds. She is upset and demanding a call back from Nitka's office wanting to [facility].  She asked for the message to be given to [place] as well.  Please call patient -back to advise.

## 2018-03-02 ENCOUNTER — Telehealth (INDEPENDENT_AMBULATORY_CARE_PROVIDER_SITE_OTHER): Payer: Self-pay | Admitting: Surgery

## 2018-03-02 NOTE — Telephone Encounter (Signed)
Patient called asked if she can be contacted on her home phone instead of cell phone. The number to contact patient is [phone]

## 2018-03-03 NOTE — Telephone Encounter (Signed)
[name] was the only one with appointments available. Scheduled her for a 30 minute slot Friday 5/24 at 9:30.  She finishes IV antibiotics Monday 5/27. [name], let me know if I need to move her. Thanks!

## 2018-03-03 NOTE — Telephone Encounter (Signed)
Patient called stated received message about NOT giving her anymore pain meds. She is upset and demanding a call back from Nitka's office wanting to [facility].   She asked for the message to be given to [place] as well.   Patient wants to know what to expect from not taking the Oxycodone.  She also wants to know if she will receive any other medication for pain.  Patient called asking why she wouldn't be prescribed anymore pain medication? She asked if she was suppose to be weaned off or just stop taking it abruptly? Worried about withdraws.  CB # [phone]

## 2018-03-04 ENCOUNTER — Ambulatory Visit (INDEPENDENT_AMBULATORY_CARE_PROVIDER_SITE_OTHER): Payer: Self-pay | Admitting: Specialist

## 2018-03-04 NOTE — Telephone Encounter (Signed)
Patient said she is having sweats and nausea, she is relating this to withdrawals from being cut off cold [place] from the oxycodone. She knew she was going to have to be weaned from it but thought the quantity would be less each time.  She is requesting something for nausea and if possible a small quantity of oxy or another pain med given to her. She uses [id number] - [place], [place] - [address]   Please call patient of house # [phone]

## 2018-03-04 NOTE — Telephone Encounter (Signed)
Patient said she is having sweats and nausea, she is relating this to withdrawals from being cut off cold [place] from the oxycodone. She knew she was going to have to be weaned from it but thought the quantity would be less each time.  She is requesting something for nausea and if possible a small quantity of oxy or another pain med given to her.

## 2018-03-05 NOTE — Progress Notes (Signed)
Subjective:    Patient ID: [name], female    DOB: Jan 15, 1956, 62 y.o.   MRN: [id number]  Chief Complaint  Patient presents with  . Wound Infection     HPI:  [name] is a 62 y.o. female who presents today for a routine follow up of [name] post-surgical MRSA infection.  [name] is currently receiving vancomycin and is scheduled to complete a six week course of IV therapy on 5/27. During [name] most recent office visit she was sent to the ED with concern for possible blood clot associated with [name] PICC line. She reports taking the medication as prescribed and denies adverse side effects. Review of blood work shows no evidence of vancomycin associated kidney injury. Not able to draw blood from the PICC line but it infuses without problem. Reports a mildly elevated temperature of around 99. Continues to have pain located in [name] lower back. [name] pain medications have been stopped recently by orthopedics. Believes [name] sweats may be related to stopping [name] pain medication abruptly. Describes occasional redness of the PICC line that comes and goes. Has questions regarding treatment for Hepatitis C. Denies any current abdominal pain, nausea, vomiting, diarrhea, constipation, or ease of bleeding.     Allergies  Allergen Reactions  . Acetaminophen-Codeine Other (See Comments)    Hand get red, feels like it's on fire  . Morphine And Related     "makes me feel like i'm on fire"  . Azithromycin Itching and Rash      Outpatient Medications Prior to Visit  Medication Sig Dispense Refill  . aspirin EC 81 MG EC tablet Take 1 tablet (81 mg total) by mouth daily. 30 tablet 3  . docusate sodium (COLACE) 100 MG capsule Take 1 capsule (100 mg total) by mouth 2 (two) times daily. 10 capsule 0  . gabapentin (NEURONTIN) 300 MG capsule Take 1 capsule (300 mg total) by mouth 3 (three) times daily. 90 capsule 3  . ibuprofen (ADVIL,MOTRIN) 400 MG tablet Take 1 tablet (400 mg total) by mouth every 6  (six) hours as needed for fever. 30 tablet 0  . LORazepam (ATIVAN) 1 MG tablet Take 1 mg by mouth at bedtime.     . mupirocin ointment (BACTROBAN) 2 % Place 1 application into the nose 2 (two) times daily. 22 g 0  . nitroGLYCERIN (NITROSTAT) 0.4 MG SL tablet Place 1 tablet (0.4 mg total) under the tongue every 5 (five) minutes x 3 doses as needed for chest pain. 25 tablet 1  . Oxycodone HCl 10 MG TABS Take 1 tablet (10 mg total) by mouth every 6 (six) hours as needed ((score 7 to 10)). 40 tablet 0  . sertraline (ZOLOFT) 50 MG tablet Take 50 mg by mouth daily.    . Vancomycin (VANCOCIN) 750-5 MG/150ML-% SOLN Inject 150 mLs (750 mg total) into the vein every 12 (twelve) hours. 56 mL 1  . vancomycin IVPB Inject 750 mg into the vein every 12 (twelve) hours. Indication:  osteomyelitis Last Day of Therapy:  03/09/2018 Labs - Sunday/Monday:  CBC/D, BMP, and vancomycin trough. Labs - Thursday:  BMP and vancomycin trough Labs - Every other week:  ESR and CRP 99 Units 0   No facility-administered medications prior to visit.      Past Medical History:  Diagnosis Date  . Acute hepatitis C without mention of hepatic coma(070.51)   . Anemia   . Arthritis    back  . Carpal tunnel syndrome   .  Chest pain, unspecified   . Chronic airway obstruction, not elsewhere classified   . Chronic hepatitis C without hepatic coma ([place]) 02/09/2018  . Colitis   . Coronary atherosclerosis of unspecified type of vessel, native or graft   . Disorders of bursae and tendons in shoulder region, unspecified   . GERD (gastroesophageal reflux disease)   . Hardware complicating wound infection ([place]) 02/09/2018  . Headache    migraines  . Hepatitis    hx of hepatitis C  . Infective otitis externa, unspecified   . Mixed hyperlipidemia   . MRSA infection 02/09/2018  . Nonspecific abnormal results of thyroid function study   . Other bursitis disorders   . Other symptoms involving nervous and musculoskeletal systems(781.99)    . Pneumonia   . Seizures ([place])    none for 4 years (as of 01/2018) "pseudo seizures"     Past Surgical History:  Procedure Laterality Date  . ABDOMINAL HYSTERECTOMY    . CARDIAC CATHETERIZATION    . CARDIAC CATHETERIZATION N/A 06/25/2016   Procedure: Left Heart Cath and Coronary Angiography;  Surgeon: Dixie Dials, MD;  Location: [facility];  Service: Cardiovascular;  Laterality: N/A;  . CESAREAN SECTION    . CHOLECYSTECTOMY    . COLONOSCOPY    . IR FLUORO GUIDED NEEDLE PLC ASPIRATION/INJECTION LOC  12/27/2017  . LUMBAR FUSION  01/13/2018   Transforaminal lumbar interbody fusion L4-5 with screws, cages and rods, local bone graft, Allograft, Vivigen, Decompression L4-5 and L5-S1  . TONSILLECTOMY       Review of Systems  Constitutional: Positive for diaphoresis. Negative for chills, fatigue, fever and unexpected weight change.  Respiratory: Negative for chest tightness, shortness of breath and wheezing.   Cardiovascular: Negative for chest pain.  Gastrointestinal: Negative for abdominal pain, blood in stool, constipation, diarrhea, nausea and vomiting.  Musculoskeletal: Positive for back pain.  Neurological: Negative for weakness and numbness.      Objective:    BP 126/78   Pulse 60   Temp 98.6 F (37 C) (Oral)   Wt 147 lb (66.7 kg)   BMI 27.78 kg/m  Nursing note and vital signs reviewed.  Physical Exam  Constitutional: She is oriented to person, place, and time. She appears well-developed and well-nourished. No distress.  Pleasant; seated in the chair, wearing a lumbar spine brace.   Cardiovascular: Normal rate, regular rhythm, normal heart sounds and intact distal pulses.  PICC line dressing appears clean, dry and intact. Insertion site is clean with no evidence of infection. There is no pain, tenderness, or induration around the site.    Pulmonary/Chest: Effort normal and breath sounds normal.  Neurological: She is alert and oriented to person, place, and time.   Skin: Skin is warm and dry.  Psychiatric: She has a normal mood and affect. [name] behavior is normal. Judgment and thought content normal.       Assessment & Plan:   Problem List Items Addressed This Visit      Digestive   Chronic hepatitis C without hepatic coma [facility])    [name] has chronic Hepatitis C with a 2b Genotype and 5.1 million viral load. We continue to await the scheduling of [name] elastography to determine the presence of fibrosis. Will have [name] meet with the schedulers today. Medication choice pending elastography results.         Musculoskeletal and Integument   Infection of lumbar spine ([place]) - Primary    [name] is s/p lumbar spine surgery with  hardware in place found to have MRSA infection and currently completing a 6 week course of vancomycin on 03/09/18. Tolerating the medication with no adverse side effects and kidney function appears stable. Scheduled to have PICC removed on 03/09/18. Plan to start doxycycline for continued treatment with anticipation of 3-6 months of oral therapy and possibly longer depending upon progression. We will see [name] back in about 1 month or sooner if needed.       Relevant Medications   doxycycline (VIBRA-TABS) 100 MG tablet       I am having [name] start on doxycycline. I am also having [name] maintain [name] LORazepam, nitroGLYCERIN, ibuprofen, Vancomycin, docusate sodium, gabapentin, mupirocin ointment, vancomycin, aspirin, sertraline, and Oxycodone HCl.   Meds ordered this encounter  Medications  . doxycycline (VIBRA-TABS) 100 MG tablet    Sig: Take 1 tablet (100 mg total) by mouth 2 (two) times daily.    Dispense:  60 tablet    Refill:  2    Order Specific Question:   Supervising Provider    Answer:   Carlyle Basques [[id number]]     Follow-up: Return in about 1 month (around 04/06/2018), or if symptoms worsen or fail to improve.   Terri Piedra, MSN, [facility] for Infectious Disease

## 2018-03-06 ENCOUNTER — Ambulatory Visit (INDEPENDENT_AMBULATORY_CARE_PROVIDER_SITE_OTHER): Payer: Self-pay | Admitting: Family

## 2018-03-06 ENCOUNTER — Encounter: Payer: Self-pay | Admitting: Family

## 2018-03-06 VITALS — BP 126/78 | HR 60 | Temp 98.6°F | Wt 147.0 lb

## 2018-03-06 DIAGNOSIS — M4626 Osteomyelitis of vertebra, lumbar region: Secondary | ICD-10-CM

## 2018-03-06 DIAGNOSIS — M869 Osteomyelitis, unspecified: Secondary | ICD-10-CM

## 2018-03-06 DIAGNOSIS — B182 Chronic viral hepatitis C: Secondary | ICD-10-CM

## 2018-03-06 MED ORDER — DOXYCYCLINE HYCLATE 100 MG PO TABS: 100.0000 mg | ORAL_TABLET | Freq: Two times a day (BID) | ORAL | 2 refills | Status: DC

## 2018-03-06 NOTE — Telephone Encounter (Signed)
Patient called again to check on the status of her medication.  I advised her that you were waiting for Dr. Otelia Sergeant to respond to the message.  CB#[phone]. Thank you.

## 2018-03-06 NOTE — Telephone Encounter (Signed)
I called and advised patient that, per Dr. Otelia Sergeant, He is not going to give her anything else for pain, that once she gets over the withdrawal she will feel better.  She said ok.

## 2018-03-06 NOTE — Assessment & Plan Note (Signed)
[name] has chronic Hepatitis C with a 2b Genotype and 5.1 million viral load. We continue to await the scheduling of her elastography to determine the presence of fibrosis. Will have her meet with the schedulers today. Medication choice pending elastography results.

## 2018-03-06 NOTE — Assessment & Plan Note (Signed)
[name] is s/p lumbar spine surgery with hardware in place found to have MRSA infection and currently completing a 6 week course of vancomycin on 03/09/18. Tolerating the medication with no adverse side effects and kidney function appears stable. Scheduled to have PICC removed on 03/09/18. Plan to start doxycycline for continued treatment with anticipation of 3-6 months of oral therapy and possibly longer depending upon progression. We will see her back in about 1 month or sooner if needed.

## 2018-03-06 NOTE — Patient Instructions (Addendum)
Nice to see you.  Please continue to take the vancomycin until Monday.  We will have the PICC line removed.  Please start taking the doxycycline on Monday.  Plan to follow up with Dr. Daiva Eves in about 1 month or sooner if needed.   We will schedule your ultrasound for Hepatitis C and pending the results send in medication.

## 2018-03-10 ENCOUNTER — Telehealth: Payer: Self-pay

## 2018-03-10 NOTE — Telephone Encounter (Signed)
Lauren from [facility] left a vm on Monday asking if she should pull pt's picc line out or have the pt f/u with [place] before her picc line is removed. Our office was not open on 03/09/18 [facility]) was unable to contact Lauren until today. Left a message for her to call us back if she had any questions for our office. Lorenso Courier, [place]

## 2018-03-12 ENCOUNTER — Ambulatory Visit (HOSPITAL_COMMUNITY): Payer: Self-pay

## 2018-03-13 ENCOUNTER — Ambulatory Visit (HOSPITAL_COMMUNITY)
Admission: RE | Admit: 2018-03-13 | Discharge: 2018-03-13 | Disposition: A | Discharge status: Home / Self Care | Payer: Self-pay | Source: Ambulatory Visit | Attending: Infectious Disease | Admitting: Infectious Disease

## 2018-03-13 DIAGNOSIS — R932 Abnormal findings on diagnostic imaging of liver and biliary tract: Secondary | ICD-10-CM | POA: Insufficient documentation

## 2018-03-13 DIAGNOSIS — B182 Chronic viral hepatitis C: Secondary | ICD-10-CM | POA: Insufficient documentation

## 2018-03-13 DIAGNOSIS — Z9049 Acquired absence of other specified parts of digestive tract: Secondary | ICD-10-CM | POA: Insufficient documentation

## 2018-03-13 DIAGNOSIS — N281 Cyst of kidney, acquired: Secondary | ICD-10-CM | POA: Insufficient documentation

## 2018-03-26 ENCOUNTER — Ambulatory Visit (INDEPENDENT_AMBULATORY_CARE_PROVIDER_SITE_OTHER): Payer: Self-pay | Admitting: Specialist

## 2018-03-26 ENCOUNTER — Ambulatory Visit (INDEPENDENT_AMBULATORY_CARE_PROVIDER_SITE_OTHER): Payer: Self-pay

## 2018-03-26 ENCOUNTER — Encounter (INDEPENDENT_AMBULATORY_CARE_PROVIDER_SITE_OTHER): Payer: Self-pay | Admitting: Specialist

## 2018-03-26 VITALS — BP 143/75 | HR 62 | Ht 61.0 in | Wt 133.0 lb

## 2018-03-26 DIAGNOSIS — M4326 Fusion of spine, lumbar region: Secondary | ICD-10-CM

## 2018-03-26 NOTE — Patient Instructions (Addendum)
Avoid frequent bending and stooping  No lifting greater than 10 lbs. May use ice or moist heat for pain. Weight loss is of benefit. Handicap license is approved.  Wear the brace for 2 1/2 weeks then you may wear the brace 1/2 days for 2 weeks then you can discontinue the brace.

## 2018-03-26 NOTE — Progress Notes (Signed)
Post-Op Visit Note   Patient: [name]           Date of Birth: 12-Jul-1956           MRN: [id number] Visit Date: 03/26/2018 PCP: Orpah Cobb, MD   Assessment & Plan: 72 days post lumbar fusion L4-5 with TLIF pedicle screws and rods.   Chief Complaint:  Chief Complaint  Patient presents with  . Lower Back - Routine Post Op   Visit Diagnoses:  1. Fusion of lumbar spine   Incision is healed. Motor both legs is normal.  Plan: Avoid frequent bending and stooping  No lifting greater than 10 lbs. May use ice or moist heat for pain. Weight loss is of benefit. Handicap license is approved. Wear the brace for 2 1/2 weeks then you may wear the brace 1/2 days for 2 weeks then you can discontinue the brace.    Follow-Up Instructions: Return in about 3 weeks (around 04/16/2018).   Orders:  Orders Placed This Encounter  Procedures  . XR Lumbar Spine 2-3 Views   No orders of the defined types were placed in this encounter.   Imaging: Xr Lumbar Spine 2-3 Views  Result Date: 03/26/2018 AP and lateral radiographs of the lumbar spine show the pedicle screws and rods L4-5 with interbody fusion in good position and alignment. No loosening of hardware.    PMFS History: Patient Active Problem List   Diagnosis Date Noted  . Anemia due to blood loss, acute 01/16/2018    Priority: High    Class: Acute  . Infection of lumbar spine (HCC) 01/16/2018    Priority: High    Class: Acute  . Spinal stenosis of lumbar region 01/13/2018    Priority: High    Class: Chronic  . Spondylolisthesis, lumbar region 01/13/2018    Priority: High    Class: Chronic  . Chronic hepatitis C without hepatic coma (HCC) 02/09/2018  . Hardware complicating wound infection (HCC) 02/09/2018  . MRSA infection 02/09/2018  . Fusion of lumbar spine 01/13/2018  . Low back pain 12/23/2017  . Tobacco use disorder 06/24/2016  . Weight gain 01/02/2012  . Stool incontinence 11/19/2011  . Abdominal pain, right  lower quadrant 11/19/2011  . EUSTACHIAN TUBE DYSFUNCTION 08/10/2009  . SINUSITIS, ACUTE 08/10/2009  . TRANSIENT GLOBAL AMNESIA 01/12/2009  . HIP PAIN, RIGHT 10/04/2008  . HEPATITIS C 07/28/2008  . COPD, 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  . HYPERLIPIDEMIA, MIXED, WITH LOW HDL 08/30/2007  . OTITIS EXTERNA, INFECTIVE NOS 08/27/2007  . ROTATOR CUFF SYNDROME, RIGHT 08/27/2007  . Chest pain at rest 08/27/2007  . BURSITIS NOS 07/13/2007  . MUSCULOSKELETAL PAIN 07/13/2007  . CARPAL TUNNEL SYNDROME, BILATERAL 04/10/2007   Past Medical History:  Diagnosis Date  . Acute hepatitis C without mention of hepatic coma(070.51)   . Anemia   . Arthritis    back  . Carpal tunnel syndrome   . Chest pain, unspecified   . Chronic airway obstruction, not elsewhere classified   . Chronic hepatitis C without hepatic coma (HCC) 02/09/2018  . Colitis   . Coronary atherosclerosis of unspecified type of vessel, native or graft   . Disorders of bursae and tendons in shoulder region, unspecified   . GERD (gastroesophageal reflux disease)   . Hardware complicating wound infection (HCC) 02/09/2018  . Headache    migraines  . Hepatitis    hx of hepatitis C  . Infective otitis externa, unspecified   . Mixed hyperlipidemia   . MRSA infection 02/09/2018  .  Nonspecific abnormal results of thyroid function study   . Other bursitis disorders   . Other symptoms involving nervous and musculoskeletal systems(781.99)   . Pneumonia   . Seizures (HCC)    none for 4 years (as of 01/2018) "pseudo seizures"    Family History  Problem Relation Age of Onset  . Crohn's disease Daughter 7133       colon resection  . Irritable bowel syndrome Daughter   . Colonic polyp Mother   . Thyroid disease Mother   . COPD Mother   . Uterine cancer Maternal Grandmother   . Heart murmur Daughter        SVT  . Colon cancer Neg Hx     Past Surgical History:  Procedure Laterality Date  . ABDOMINAL HYSTERECTOMY    . CARDIAC CATHETERIZATION    .  CARDIAC CATHETERIZATION N/A 06/25/2016   Procedure: Left Heart Cath and Coronary Angiography;  Surgeon: Orpah CobbAjay [name], MD;  Location: [facility];  Service: Cardiovascular;  Laterality: N/A;  . CESAREAN SECTION    . CHOLECYSTECTOMY    . COLONOSCOPY    . IR FLUORO GUIDED NEEDLE PLC ASPIRATION/INJECTION LOC  12/27/2017  . LUMBAR FUSION  01/13/2018   Transforaminal lumbar interbody fusion L4-5 with screws, cages and rods, local bone graft, Allograft, Vivigen, Decompression L4-5 and L5-S1  . TONSILLECTOMY     Social History   Occupational History  . Occupation: care giver  Tobacco Use  . Smoking status: Former Smoker    Types: Cigarettes    Last attempt to quit: 01/07/2018    Years since quitting: 0.2  . Smokeless tobacco: Never Used  . Tobacco comment: 2 cigarettes a day  Substance and Sexual Activity  . Alcohol use: No  . Drug use: No  . Sexual activity: Yes    Partners: Male

## 2018-04-07 ENCOUNTER — Encounter: Payer: Self-pay | Admitting: Infectious Disease

## 2018-04-07 ENCOUNTER — Ambulatory Visit (INDEPENDENT_AMBULATORY_CARE_PROVIDER_SITE_OTHER): Payer: Self-pay | Admitting: Infectious Disease

## 2018-04-07 VITALS — BP 125/78 | HR 56 | Temp 97.7°F | Ht 61.0 in | Wt 150.0 lb

## 2018-04-07 DIAGNOSIS — B182 Chronic viral hepatitis C: Secondary | ICD-10-CM

## 2018-04-07 DIAGNOSIS — Z23 Encounter for immunization: Secondary | ICD-10-CM

## 2018-04-07 DIAGNOSIS — A4902 Methicillin resistant Staphylococcus aureus infection, unspecified site: Secondary | ICD-10-CM

## 2018-04-07 DIAGNOSIS — L270 Generalized skin eruption due to drugs and medicaments taken internally: Secondary | ICD-10-CM

## 2018-04-07 DIAGNOSIS — R21 Rash and other nonspecific skin eruption: Secondary | ICD-10-CM | POA: Insufficient documentation

## 2018-04-07 DIAGNOSIS — B370 Candidal stomatitis: Secondary | ICD-10-CM

## 2018-04-07 DIAGNOSIS — T847XXD Infection and inflammatory reaction due to other internal orthopedic prosthetic devices, implants and grafts, subsequent encounter: Secondary | ICD-10-CM

## 2018-04-07 HISTORY — DX: Generalized skin eruption due to drugs and medicaments taken internally: L27.0

## 2018-04-07 HISTORY — DX: Candidal stomatitis: B37.0

## 2018-04-07 MED ORDER — SOFOSBUVIR-VELPATASVIR 400-100 MG PO TABS: 1.0000 | ORAL_TABLET | Freq: Every day | ORAL | 2 refills | Status: DC

## 2018-04-07 MED ORDER — SULFAMETHOXAZOLE-TRIMETHOPRIM 800-160 MG PO TABS: 1.0000 | ORAL_TABLET | Freq: Two times a day (BID) | ORAL | 5 refills | Status: DC

## 2018-04-07 MED ORDER — FLUCONAZOLE 100 MG PO TABS: 100.0000 mg | ORAL_TABLET | Freq: Every day | ORAL | 0 refills | Status: DC

## 2018-04-07 MED FILL — SULFAMETHOXAZOLE-TMP DS TAB: 800-160 | 30 days supply | Qty: 60 | Fill #0

## 2018-04-07 MED FILL — FLUCONAZOLE 100 MG TABLET: 100 | 10 days supply | Qty: 10 | Fill #0

## 2018-04-07 MED FILL — LORazepam 1 MG TABS: 1 | 30 days supply | Qty: 15 | Fill #0

## 2018-04-07 MED FILL — oxyCODONE HCL 5 MG TABS: 5 | 30 days supply | Qty: 15 | Fill #0

## 2018-04-07 NOTE — Progress Notes (Signed)
Subjective:   Chief complaint: Rash on arm and leg also complaining of thrush   Patient ID: [name], female    DOB: 02/27/1956, 62 y.o.   MRN: [id number]  HPI  KAYELEE Hoover is a 62 y.o. female withTransforaminal lumbar interbody fusion L4-5 with screws, cages and rods, local bone graft, Allograft, Vivigen, Decompression L4-5 and L5-S1 who had MR Staph aureus grow from intraop culture. She also has comorbid hepatitis C without hepatic coma and with now prior treatment.  When I last saw her we had concerns about potential DVT and pulmonary embolism.  We ruled out both.  She is now subsequently completed her IV antibiotics and transition to doxycycline.  While on doxycycline she developed a rash on her arm right greater than left and on her left leg which became ulcerated.  She has stopped the doxycycline 6 days ago with stabilization of her ulcerations.  She also is complaining of a sore throat and states that "I think I have thrush "    Past Medical History:  Diagnosis Date  . Acute hepatitis C without mention of hepatic coma(070.51)   . Anemia   . Arthritis    back  . Carpal tunnel syndrome   . Chest pain, unspecified   . Chronic airway obstruction, not elsewhere classified   . Chronic hepatitis C without hepatic coma (HCC) 02/09/2018  . Colitis   . Coronary atherosclerosis of unspecified type of vessel, native or graft   . Disorders of bursae and tendons in shoulder region, unspecified   . GERD (gastroesophageal reflux disease)   . Hardware complicating wound infection (HCC) 02/09/2018  . Headache    migraines  . Hepatitis    hx of hepatitis C  . Infective otitis externa, unspecified   . Mixed hyperlipidemia   . MRSA infection 02/09/2018  . Nonspecific abnormal results of thyroid function study   . Other bursitis disorders   . Other symptoms involving nervous and musculoskeletal systems(781.99)   . Pneumonia   . Seizures (HCC)    none for 4 years (as of 01/2018)  "pseudo seizures"    Past Surgical History:  Procedure Laterality Date  . ABDOMINAL HYSTERECTOMY    . CARDIAC CATHETERIZATION    . CARDIAC CATHETERIZATION N/A 06/25/2016   Procedure: Left Heart Cath and Coronary Angiography;  Surgeon: Orpah Cobb, MD;  Location: [facility];  Service: Cardiovascular;  Laterality: N/A;  . CESAREAN SECTION    . CHOLECYSTECTOMY    . COLONOSCOPY    . IR FLUORO GUIDED NEEDLE PLC ASPIRATION/INJECTION LOC  12/27/2017  . LUMBAR FUSION  01/13/2018   Transforaminal lumbar interbody fusion L4-5 with screws, cages and rods, local bone graft, Allograft, Vivigen, Decompression L4-5 and L5-S1  . TONSILLECTOMY      Family History  Problem Relation Age of Onset  . Crohn's disease Daughter 42       colon resection  . Irritable bowel syndrome Daughter   . Colonic polyp Mother   . Thyroid disease Mother   . COPD Mother   . Uterine cancer Maternal Grandmother   . Heart murmur Daughter        SVT  . Colon cancer Neg Hx       Social History   Socioeconomic History  . Marital status: Legally Separated    Spouse name: Not on file  . Number of children: 4  . Years of education: Not on file  . Highest education level: Not on file  Occupational  History  . Occupation: care giver  Social Needs  . Financial resource strain: Not on file  . Food insecurity:    Worry: Not on file    Inability: Not on file  . Transportation needs:    Medical: Not on file    Non-medical: Not on file  Tobacco Use  . Smoking status: Former Smoker    Types: Cigarettes    Last attempt to quit: 01/07/2018    Years since quitting: 0.2  . Smokeless tobacco: Never Used  . Tobacco comment: 2 cigarettes a day  Substance and Sexual Activity  . Alcohol use: No  . Drug use: No  . Sexual activity: Yes    Partners: Male  Lifestyle  . Physical activity:    Days per week: Not on file    Minutes per session: Not on file  . Stress: Not on file  Relationships  . Social connections:     Talks on phone: Not on file    Gets together: Not on file    Attends religious service: Not on file    Active member of club or organization: Not on file    Attends meetings of clubs or organizations: Not on file    Relationship status: Not on file  Other Topics Concern  . Not on file  Social History Narrative  . Not on file    Allergies  Allergen Reactions  . Acetaminophen-Codeine Other (See Comments)    Hand get red, feels like it's on fire  . Morphine And Related     "makes me feel like i'm on fire"  . Azithromycin Itching and Rash     Current Outpatient Medications:  .  sertraline (ZOLOFT) 50 MG tablet, Take 50 mg by mouth daily., Disp: , Rfl:  .  doxycycline (VIBRAMYCIN) 100 MG capsule, Take 100 mg by mouth 2 (two) times daily., Disp: , Rfl:    Review of Systems  Constitutional: Positive for fatigue. Negative for activity change, appetite change, chills, diaphoresis, fever and unexpected weight change.  HENT: Positive for mouth sores and trouble swallowing. Negative for congestion, rhinorrhea, sinus pressure, sneezing and sore throat.   Eyes: Negative for photophobia and visual disturbance.  Respiratory: Negative for cough, chest tightness, shortness of breath, wheezing and stridor.   Cardiovascular: Negative for chest pain, palpitations and leg swelling.  Gastrointestinal: Negative for abdominal distention, abdominal pain, anal bleeding, blood in stool, constipation, diarrhea, nausea and vomiting.  Genitourinary: Negative for difficulty urinating, dysuria, flank pain and hematuria.  Musculoskeletal: Positive for arthralgias. Negative for back pain, gait problem, joint swelling and myalgias.  Skin: Positive for rash and wound. Negative for color change and pallor.  Neurological: Negative for dizziness, tremors, weakness and light-headedness.  Hematological: Negative for adenopathy. Does not bruise/bleed easily.  Psychiatric/Behavioral: Negative for agitation,  behavioral problems, confusion, decreased concentration, dysphoric mood and sleep disturbance.       Objective:   Physical Exam  Constitutional: She is oriented to person, place, and time. She appears well-developed and well-nourished. She is cooperative. She does not appear ill. No distress.  HENT:  Head: Normocephalic and atraumatic.  Right Ear: Hearing and external ear normal.  Left Ear: Hearing and external ear normal.  Nose: No rhinorrhea or nasal deformity. No epistaxis.  Mouth/Throat:    Eyes: Conjunctivae and EOM are normal. Right conjunctiva is not injected. Left conjunctiva is not injected. No scleral icterus.  Neck: Normal range of motion. Neck supple. No JVD present.  Cardiovascular: Normal rate, regular rhythm,  S1 normal, S2 normal and normal heart sounds. Exam reveals no friction rub.  No murmur heard. Pulmonary/Chest: Breath sounds normal. No respiratory distress. She has no wheezes. She has no rales.  Abdominal: Soft. Normal appearance and bowel sounds are normal. She exhibits no distension and no ascites. There is no hepatosplenomegaly. There is no tenderness.  Musculoskeletal: Normal range of motion.       Right shoulder: Normal.       Left shoulder: Normal.       Right hip: Normal.       Left hip: Normal.       Right knee: Normal.       Left knee: Normal.       Arms: Lymphadenopathy:       Head (right side): No submandibular, no preauricular and no posterior auricular adenopathy present.       Head (left side): No submandibular, no preauricular and no posterior auricular adenopathy present.    She has no cervical adenopathy.       Right cervical: No superficial cervical and no deep cervical adenopathy present.      Left cervical: No superficial cervical and no deep cervical adenopathy present.  Neurological: She is alert and oriented to person, place, and time. She has normal strength. No sensory deficit. Coordination and gait normal.  Skin: Skin is warm, dry  and intact. No abrasion, no bruising, no ecchymosis and no lesion noted. She is not diaphoretic. There is erythema. No cyanosis. No pallor. Nails show no clubbing.  Psychiatric: She has a normal mood and affect. Her speech is normal and behavior is normal. Judgment and thought content normal. Cognition and memory are normal. She is attentive.  Nursing note and vitals reviewed.   Thrush on palate April 07, 2018:     Rash on arm and left lower extremity April 07, 2018:              Assessment & Plan:  New rash: This could indeed be due to doxycycline given his and relatively sun exposed areas she will continue to hold doxycycline hopefully this will then resolve  Thrush we will give her fluconazole for 14 days  Vertebral disk infection associated with hardware: Would like to have her back on an antistaphylococcal antibiotic and would like to put her on Bactrim but will letter complete her therapy for throat thrush first and hopefully also have resolution in her rash in the meantime  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

## 2018-04-07 NOTE — Patient Instructions (Signed)
I will prescribe you fluconazole 100mg  a day x 14 days for your thrush  Then we will begin Bactrim DS one tablet twice daily for your spine infection  We are working on getting you medicaitons to cure your hepatitis C  I want to make sure that you meet with ID pharmacy for followup for this  We need to vaccinate you for Hep B today

## 2018-04-07 NOTE — Progress Notes (Signed)
HPI: [name] is a 62 y.o. female who is here for her lumbar infection.   Lab Results  Component Value Date   HCVGENOTYPE 2b 01/15/2018   HEPCGENOTYPE 2b 01/19/2018    Allergies: Allergies  Allergen Reactions  . Acetaminophen-Codeine Other (See Comments)    Hand get red, feels like it's on fire  . Morphine And Related     "makes me feel like i'm on fire"  . Azithromycin Itching and Rash    Vitals: Temp: 97.7 F (36.5 C) (06/25 0947) Temp Source: Oral (06/25 0947) BP: 125/78 (06/25 0947) Pulse Rate: 56 (06/25 0947)  Past Medical History: Past Medical History:  Diagnosis Date  . Acute hepatitis C without mention of hepatic coma(070.51)   . Anemia   . Arthritis    back  . Carpal tunnel syndrome   . Chest pain, unspecified   . Chronic airway obstruction, not elsewhere classified   . Chronic hepatitis C without hepatic coma ([place]) 02/09/2018  . Colitis   . Coronary atherosclerosis of unspecified type of vessel, native or graft   . Disorders of bursae and tendons in shoulder region, unspecified   . Drug-induced skin rash 04/07/2018  . GERD (gastroesophageal reflux disease)   . Hardware complicating wound infection ([place]) 02/09/2018  . Headache    migraines  . Hepatitis    hx of hepatitis C  . Infective otitis externa, unspecified   . Mixed hyperlipidemia   . MRSA infection 02/09/2018  . Nonspecific abnormal results of thyroid function study   . Other bursitis disorders   . Other symptoms involving nervous and musculoskeletal systems(781.99)   . Pneumonia   . Seizures ([place])    none for 4 years (as of 01/2018) "pseudo seizures"  . Thrush 04/07/2018    Social History: Social History   Socioeconomic History  . Marital status: Legally Separated    Spouse name: Not on file  . Number of children: 4  . Years of education: Not on file  . Highest education level: Not on file  Occupational History  . Occupation: care giver  Social Needs  . Financial resource strain:  Not on file  . Food insecurity:    Worry: Not on file    Inability: Not on file  . Transportation needs:    Medical: Not on file    Non-medical: Not on file  Tobacco Use  . Smoking status: Former Smoker    Types: Cigarettes    Last attempt to quit: 01/07/2018    Years since quitting: 0.2  . Smokeless tobacco: Never Used  . Tobacco comment: 2 cigarettes a day  Substance and Sexual Activity  . Alcohol use: No  . Drug use: No  . Sexual activity: Yes    Partners: Male  Lifestyle  . Physical activity:    Days per week: Not on file    Minutes per session: Not on file  . Stress: Not on file  Relationships  . Social connections:    Talks on phone: Not on file    Gets together: Not on file    Attends religious service: Not on file    Active member of club or organization: Not on file    Attends meetings of clubs or organizations: Not on file    Relationship status: Not on file  Other Topics Concern  . Not on file  Social History Narrative  . Not on file    Labs: Hep B S Ab (no units)  Date Value  01/19/2018 Non Reactive   Hepatitis B Surface Ag (no units)  Date Value  01/15/2018 Negative   HCV Ab (s/co ratio)  Date Value  01/15/2018 >11.0 (H)    Lab Results  Component Value Date   HCVGENOTYPE 2b 01/15/2018   HEPCGENOTYPE 2b 01/19/2018    Hepatitis C RNA quantitative Latest Ref Rng & Units 03/23/2009  HCV Quantitative <43 IU/mL 1610000(H)  HCV Quantitative Log <1.63 log 10 6.21 (NOTE)  This test utilizes the [place] FDA approved Roche HCV Test Kit by RT-PCR.(H)    AST (U/L)  Date Value  02/26/2018 23  01/19/2018 15  01/16/2018 26   ALT (U/L)  Date Value  02/26/2018 18  01/19/2018 11 (L)  01/16/2018 16   ALT(SGPT) (U/L)  Date Value  01/15/2018 EDTA PL   INR (no units)  Date Value  01/13/2018 1.10  06/25/2016 1.07  07/15/2009 0.9    CrCl: CrCl cannot be calculated (Patient's most recent lab result is older than the maximum 21 days  allowed.).  Fibrosis Score: F3/4 as assessed by [facility]  Child-Pugh Score: Class A  Previous Treatment Regimen: None  Assessment: Malachy Mood saw [name] in May for her lumbar infection. She also has hep C with genotype 2. She got the elastography and it came back F3/4. She is uninsured, therefore, we will use the [facility] to get her Epclusa x 3 months. She has no income right now and is staying with her sister temporary. She will be going back to [place] on 7/14. Therefore, the meds will have to be mailed to her from the [facility]. Told her that she must call them every month for refill and she needs a full 3 month. She will come back here for labs and the cure visit. We probably can bring her back for the EOT and SVR12 visits to do labs. She will sign the paperwork today so Inez Catalina can submit her application.  Counseled on the importance of adherence and side effects.   Recommendations:  Epclusa 1 PO qday x 12 wks F/u with [place] at the EOT and [facility], Pharm.D., BCPS, AAHIVP Clinical Infectious [place] [facility] 04/07/2018, 10:26 AM

## 2018-04-08 LAB — CBC WITH DIFFERENTIAL/PLATELET
BASOS PCT: 0.9 %
Basophils Absolute: 78 cells/uL (ref 0–200)
EOS PCT: 4 %
Eosinophils Absolute: 348 cells/uL (ref 15–500)
HCT: 41.6 % (ref 35.0–45.0)
HEMOGLOBIN: 13.5 g/dL (ref 11.7–15.5)
Lymphs Abs: 2445 cells/uL (ref 850–3900)
MCH: 28.5 pg (ref 27.0–33.0)
MCHC: 32.5 g/dL (ref 32.0–36.0)
MCV: 87.8 fL (ref 80.0–100.0)
MPV: 10.4 fL (ref 7.5–12.5)
Monocytes Relative: 9.2 %
NEUTROS ABS: 5029 {cells}/uL (ref 1500–7800)
Neutrophils Relative %: 57.8 %
Platelets: 287 10*3/uL (ref 140–400)
RBC: 4.74 10*6/uL (ref 3.80–5.10)
RDW: 14 % (ref 11.0–15.0)
Total Lymphocyte: 28.1 %
WBC mixed population: 800 cells/uL (ref 200–950)
WBC: 8.7 10*3/uL (ref 3.8–10.8)

## 2018-04-08 LAB — C-REACTIVE PROTEIN: CRP: 2.8 mg/L (ref <8.0)

## 2018-04-08 LAB — COMPLETE METABOLIC PANEL WITH GFR
AG RATIO: 1.5 (calc) (ref 1.0–2.5)
ALT: 16 U/L (ref 6–29)
AST: 24 U/L (ref 10–35)
Albumin: 3.8 g/dL (ref 3.6–5.1)
Alkaline phosphatase (APISO): 81 U/L (ref 33–130)
BILIRUBIN TOTAL: 0.2 mg/dL (ref 0.2–1.2)
BUN: 10 mg/dL (ref 7–25)
CHLORIDE: 106 mmol/L (ref 98–110)
CO2: 28 mmol/L (ref 20–32)
Calcium: 8.8 mg/dL (ref 8.6–10.4)
Creat: 0.75 mg/dL (ref 0.50–0.99)
GFR, Est African American: 99 mL/min/{1.73_m2} (ref >=60)
GFR, Est Non African American: 85 mL/min/{1.73_m2} (ref >=60)
GLUCOSE: 103 mg/dL — AB (ref 65–99)
Globulin: 2.5 g/dL (calc) (ref 1.9–3.7)
POTASSIUM: 3.7 mmol/L (ref 3.5–5.3)
Sodium: 142 mmol/L (ref 135–146)
Total Protein: 6.3 g/dL (ref 6.1–8.1)

## 2018-04-08 LAB — SEDIMENTATION RATE: SED RATE: 2 mm/h (ref 0–30)

## 2018-04-08 LAB — HEPATITIS A ANTIBODY, TOTAL: HEPATITIS A AB,TOTAL: NONREACTIVE

## 2018-04-09 ENCOUNTER — Ambulatory Visit (INDEPENDENT_AMBULATORY_CARE_PROVIDER_SITE_OTHER): Payer: Self-pay | Admitting: Surgery

## 2018-04-09 ENCOUNTER — Encounter (INDEPENDENT_AMBULATORY_CARE_PROVIDER_SITE_OTHER): Payer: Self-pay | Admitting: Surgery

## 2018-04-09 ENCOUNTER — Telehealth (INDEPENDENT_AMBULATORY_CARE_PROVIDER_SITE_OTHER): Payer: Self-pay | Admitting: Surgery

## 2018-04-09 ENCOUNTER — Ambulatory Visit (INDEPENDENT_AMBULATORY_CARE_PROVIDER_SITE_OTHER): Payer: Self-pay

## 2018-04-09 ENCOUNTER — Telehealth: Payer: Self-pay | Admitting: Pharmacist Clinician (PhC)/ Clinical Pharmacy Specialist

## 2018-04-09 VITALS — BP 127/75 | HR 76 | Ht 61.0 in | Wt 150.0 lb

## 2018-04-09 DIAGNOSIS — M4326 Fusion of spine, lumbar region: Secondary | ICD-10-CM

## 2018-04-09 NOTE — Telephone Encounter (Signed)
[facility] called to say that they tried to contact the patient for delivery but phone number is not working. Gave them both numbers in Epic.

## 2018-04-09 NOTE — Telephone Encounter (Signed)
Can you ask Fayrene FearingJames, since he saw her last
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
## 2018-04-09 NOTE — Telephone Encounter (Signed)
Pt would like paperwork pertaining to her restrictions

## 2018-04-10 NOTE — Telephone Encounter (Signed)
IC the patient at her sister's house per request [phone] Elsie RaBrenda Foust) : OV note from yesterday, with added restrictions printed out for patient (she said she'll pick it up here Monday).  Reiterated that Dr. Otelia SergeantNitka said she could discontinue the lumbar brace now, instead of in 6 months like she thought.  She repeated this back to me with full understanding (this is also stated in the note she will pick up).

## 2018-04-21 ENCOUNTER — Encounter: Payer: Self-pay | Admitting: Pharmacy Technician

## 2018-04-27 ENCOUNTER — Telehealth: Payer: Self-pay | Admitting: Pharmacist Clinician (PhC)/ Clinical Pharmacy Specialist

## 2018-04-27 NOTE — Telephone Encounter (Signed)
Linda MaxwellCheryl called to say that she recently started on Epclusa but she is having some side effects of being high and insomnia. Told that it's uncommon but it should subside soon. If she needs to, she can take some benadryl.

## 2018-04-28 NOTE — Telephone Encounter (Signed)
thanks [place]. I would hope she can push on and complete therapy

## 2018-05-12 ENCOUNTER — Telehealth: Payer: Self-pay | Admitting: Pharmacy Technician

## 2018-05-12 NOTE — Telephone Encounter (Signed)
Tried all three phone numbers I have and two did not answer or have a voicemail and her mother answered at the third numbers, but does not have her current phone number.  She needs Hep C follow up appointment.

## 2018-05-29 ENCOUNTER — Telehealth (INDEPENDENT_AMBULATORY_CARE_PROVIDER_SITE_OTHER): Payer: Self-pay | Admitting: Specialist

## 2018-05-29 NOTE — Telephone Encounter (Signed)
Patient called inquiring if we received request for records from her atty. I advised her that we have not received the request. She will follow up with them

## 2018-05-29 NOTE — Telephone Encounter (Signed)
Patient called stating that she has misplaced the note that Dr. Otelia SergeantNitka wrote for her that stated what she can and can not do.  She is needing another one.  I looked in her chart.  CB#[phone].  Thank  You.

## 2018-06-02 NOTE — Telephone Encounter (Signed)
I called and spoke with patient, this info she needed was in the last OV note from when she saw Fayrene FearingJames on 04/09/18

## 2018-06-02 NOTE — Telephone Encounter (Signed)
I mailed that note to patient at [address], [address], [place], [place], [id number], at her request

## 2018-08-22 IMAGING — CR DG CHEST 2V
2 series · 2 of 2 positions shown · non-contrast
Comparison: 01/14/2018 chest radiograph

CLINICAL DATA: 62 y/o F; chest tightness with concern for right
upper extremity DVT/PE.

EXAM:
CHEST - 2 VIEW

[chest pa]
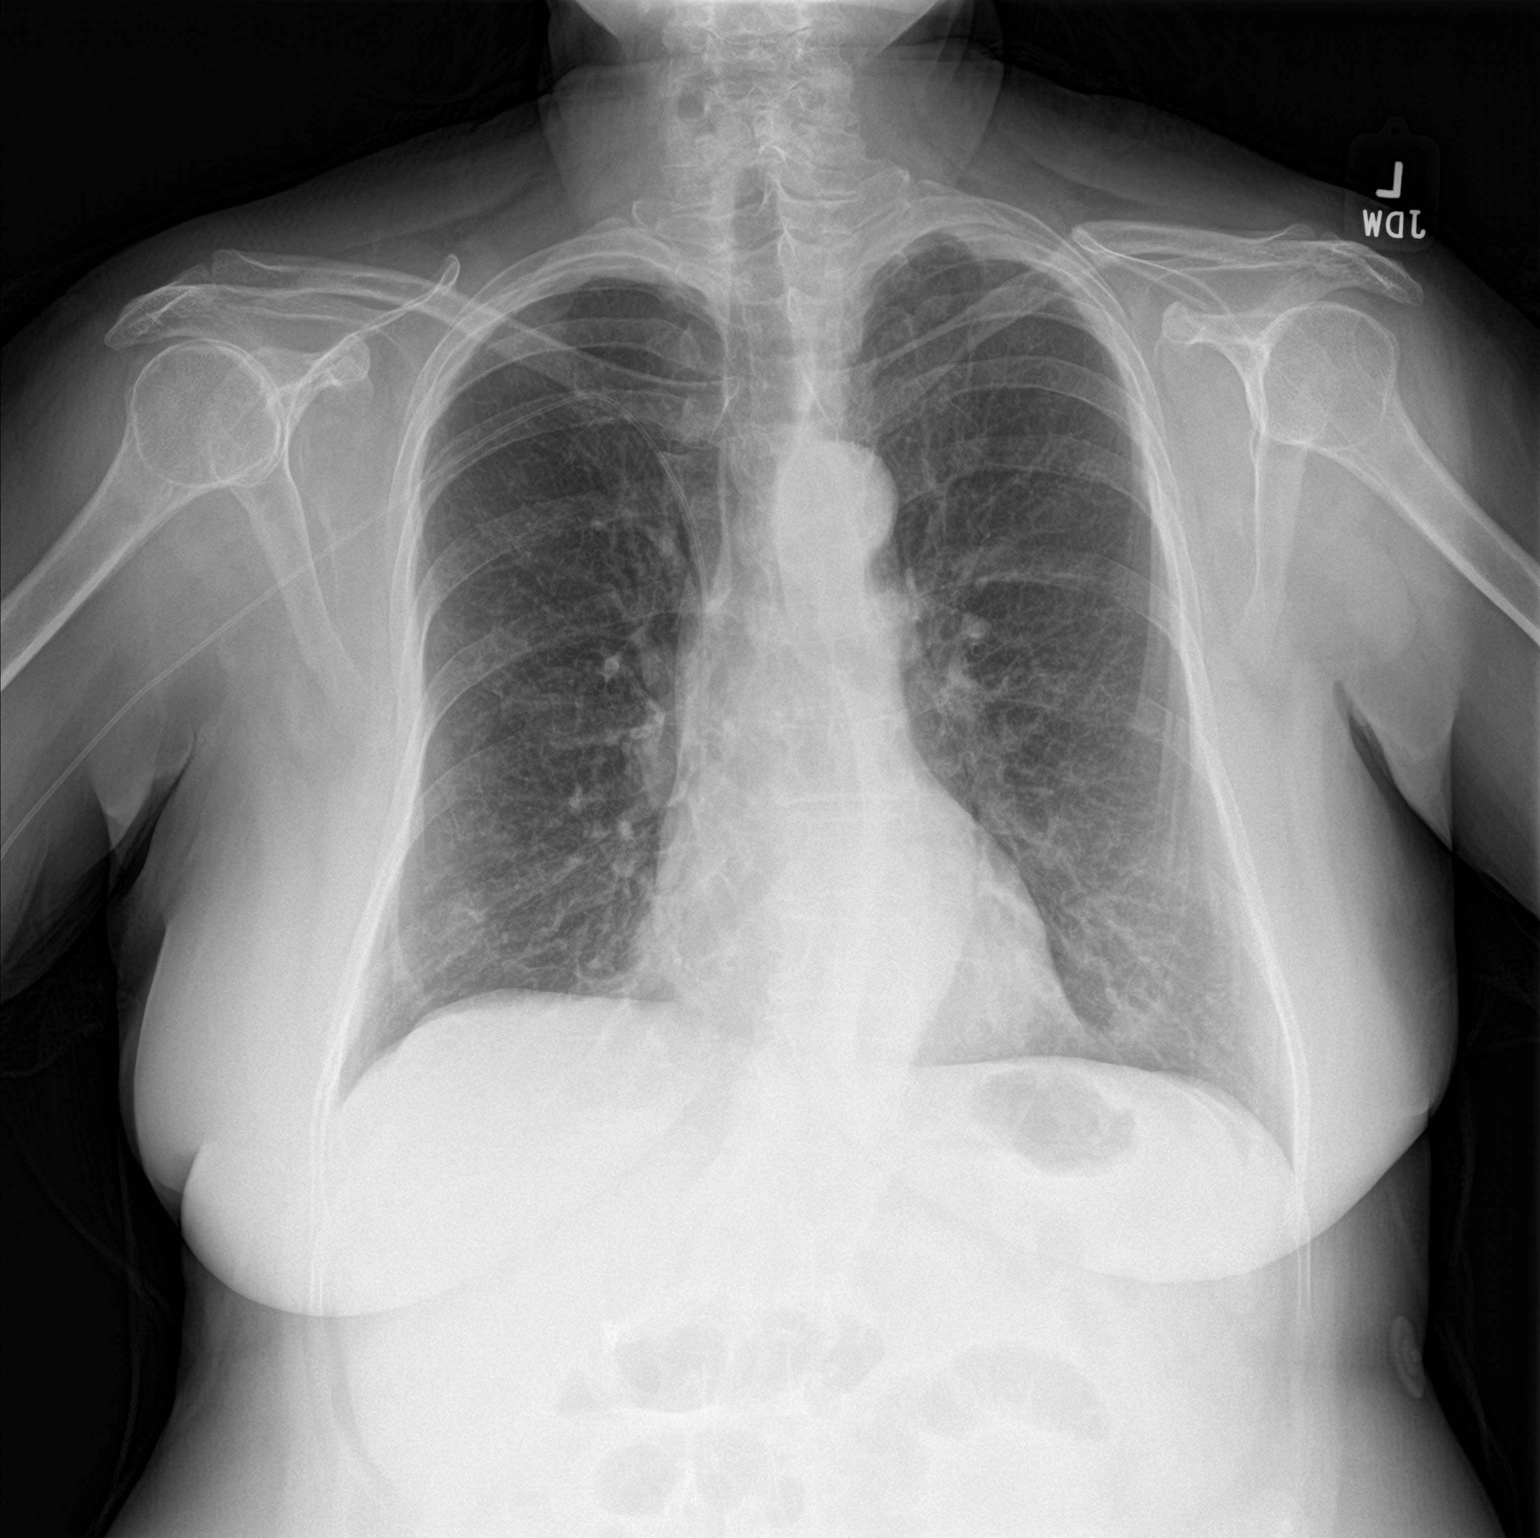

[chest lat]
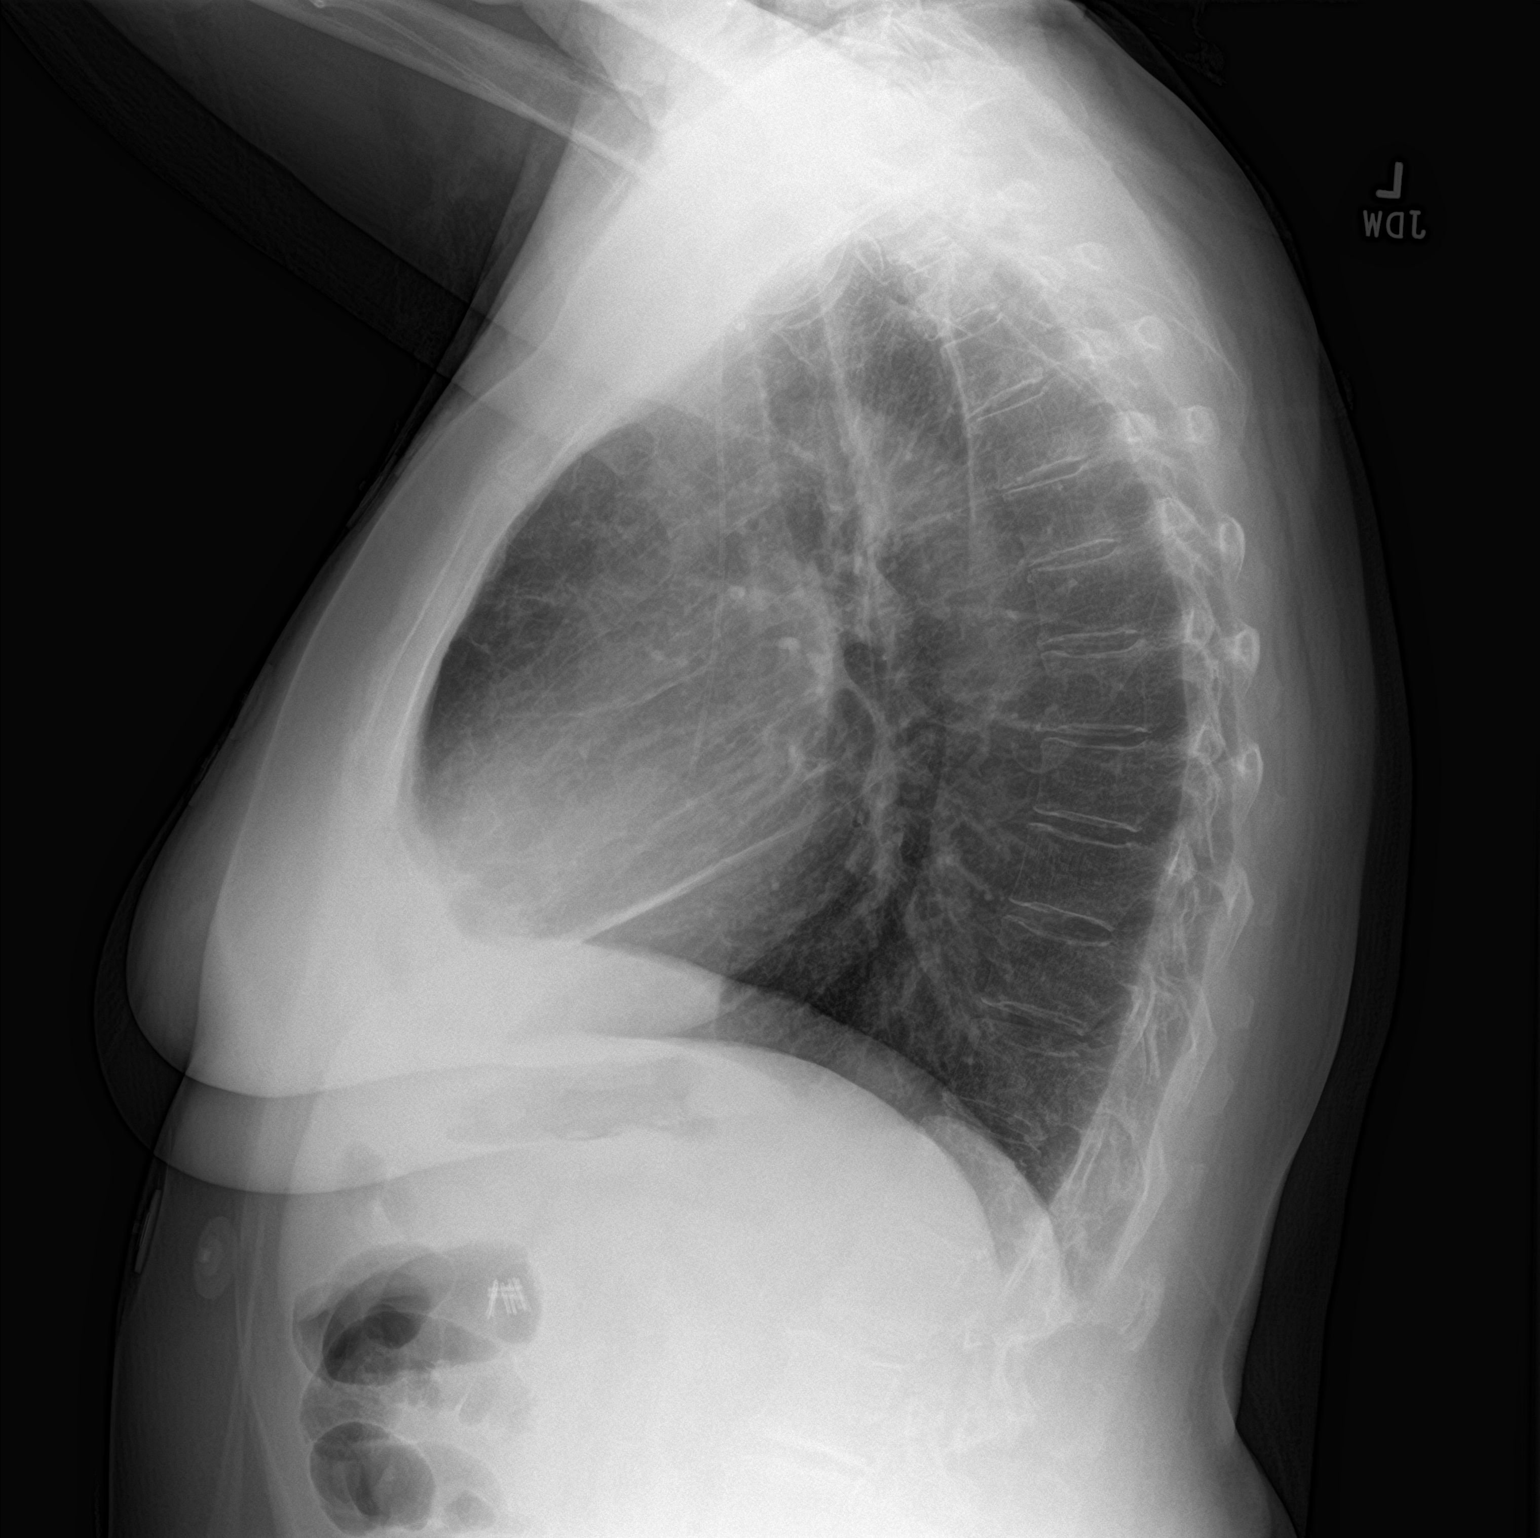

[2 of 2 positions shown; findings below may reference images not displayed]

FINDINGS: Normal cardiac silhouette given projection and technique. Calcific
atherosclerosis of aorta. No focal consolidation, pleural effusion,
or pneumothorax. Right PICC line catheter tip projects over lower
SVC. No acute osseous abnormality is evident. Right upper quadrant
cholecystectomy clips.
IMPRESSION: No acute pulmonary process identified.

By: Intralux Urbain M.D.

## 2018-11-25 ENCOUNTER — Observation Stay (HOSPITAL_COMMUNITY)
Admission: AD | Admit: 2018-11-25 | Discharge: 2018-11-26 | Disposition: A | Discharge status: Home / Self Care | Payer: Self-pay | Source: Ambulatory Visit | Attending: Cardiovascular Disease | Admitting: Cardiovascular Disease

## 2018-11-25 DIAGNOSIS — I249 Acute ischemic heart disease, unspecified: Secondary | ICD-10-CM | POA: Diagnosis present

## 2018-11-25 DIAGNOSIS — Z23 Encounter for immunization: Secondary | ICD-10-CM | POA: Insufficient documentation

## 2018-11-25 DIAGNOSIS — E785 Hyperlipidemia, unspecified: Secondary | ICD-10-CM | POA: Insufficient documentation

## 2018-11-25 DIAGNOSIS — Z8619 Personal history of other infectious and parasitic diseases: Secondary | ICD-10-CM | POA: Insufficient documentation

## 2018-11-25 DIAGNOSIS — Z9889 Other specified postprocedural states: Secondary | ICD-10-CM | POA: Insufficient documentation

## 2018-11-25 DIAGNOSIS — I2510 Atherosclerotic heart disease of native coronary artery without angina pectoris: Secondary | ICD-10-CM | POA: Insufficient documentation

## 2018-11-25 DIAGNOSIS — F17200 Nicotine dependence, unspecified, uncomplicated: Secondary | ICD-10-CM | POA: Diagnosis present

## 2018-11-25 DIAGNOSIS — R079 Chest pain, unspecified: Secondary | ICD-10-CM | POA: Diagnosis present

## 2018-11-25 DIAGNOSIS — Z87891 Personal history of nicotine dependence: Secondary | ICD-10-CM | POA: Insufficient documentation

## 2018-11-25 DIAGNOSIS — M940 Chondrocostal junction syndrome [Tietze]: Principal | ICD-10-CM | POA: Insufficient documentation

## 2018-11-25 DIAGNOSIS — Z79899 Other long term (current) drug therapy: Secondary | ICD-10-CM | POA: Insufficient documentation

## 2018-11-25 LAB — CBC WITH DIFFERENTIAL/PLATELET
Abs Immature Granulocytes: 0.03 10*3/uL (ref 0.00–0.07)
Basophils Absolute: 0.1 10*3/uL (ref 0.0–0.1)
Basophils Relative: 1 %
Eosinophils Absolute: 0.2 10*3/uL (ref 0.0–0.5)
Eosinophils Relative: 2 %
HCT: 43.3 % (ref 36.0–46.0)
Hemoglobin: 13.7 g/dL (ref 12.0–15.0)
Immature Granulocytes: 0 %
Lymphocytes Relative: 40 %
Lymphs Abs: 4.1 10*3/uL — ABNORMAL HIGH (ref 0.7–4.0)
MCH: 27.8 pg (ref 26.0–34.0)
MCHC: 31.6 g/dL (ref 30.0–36.0)
MCV: 88 fL (ref 80.0–100.0)
MONO ABS: 0.8 10*3/uL (ref 0.1–1.0)
Monocytes Relative: 7 %
Neutro Abs: 5.1 10*3/uL (ref 1.7–7.7)
Neutrophils Relative %: 50 %
Platelets: 365 10*3/uL (ref 150–400)
RBC: 4.92 MIL/uL (ref 3.87–5.11)
RDW: 14.9 % (ref 11.5–15.5)
WBC: 10.2 10*3/uL (ref 4.0–10.5)
nRBC: 0 % (ref 0.0–0.2)

## 2018-11-25 LAB — COMPREHENSIVE METABOLIC PANEL
ALBUMIN: 3.7 g/dL (ref 3.5–5.0)
ALT: 11 U/L (ref 0–44)
AST: 21 U/L (ref 15–41)
Alkaline Phosphatase: 68 U/L (ref 38–126)
Anion gap: 13 (ref 5–15)
BUN: 13 mg/dL (ref 8–23)
CHLORIDE: 107 mmol/L (ref 98–111)
CO2: 21 mmol/L — ABNORMAL LOW (ref 22–32)
Calcium: 9.1 mg/dL (ref 8.9–10.3)
Creatinine, Ser: 1.01 mg/dL — ABNORMAL HIGH (ref 0.44–1.00)
GFR calc Af Amer: >60 mL/min (ref >60)
GFR calc non Af Amer: 60 mL/min — ABNORMAL LOW (ref >60)
Glucose, Bld: 103 mg/dL — ABNORMAL HIGH (ref 70–99)
Potassium: 3.9 mmol/L (ref 3.5–5.1)
Sodium: 141 mmol/L (ref 135–145)
Total Bilirubin: 0.3 mg/dL (ref 0.3–1.2)
Total Protein: 6.6 g/dL (ref 6.5–8.1)

## 2018-11-25 LAB — TROPONIN I: Troponin I: <0.03 ng/mL (ref <0.03)

## 2018-11-25 MED ORDER — ASPIRIN EC 81 MG PO TBEC
81.0000 mg | DELAYED_RELEASE_TABLET | Freq: Every day | ORAL | Status: DC
  Administered 2018-11-26: 81 mg via ORAL
  Filled 2018-11-25: qty 1

## 2018-11-25 MED ORDER — HEPARIN (PORCINE) 25000 UT/250ML-% IV SOLN
1000.0000 [IU]/h | INTRAVENOUS | Status: DC
  Administered 2018-11-25: 1000 [IU]/h via INTRAVENOUS
  Filled 2018-11-25: qty 250

## 2018-11-25 MED ORDER — NITROGLYCERIN 0.4 MG SL SUBL
0.4000 mg | SUBLINGUAL_TABLET | SUBLINGUAL | Status: DC | PRN
  Administered 2018-11-25 (×2): 0.4 mg via SUBLINGUAL
  Filled 2018-11-25 (×2): qty 1

## 2018-11-25 MED ORDER — INFLUENZA VAC SPLIT QUAD 0.5 ML IM SUSY
0.5000 mL | PREFILLED_SYRINGE | INTRAMUSCULAR | Status: AC
  Administered 2018-11-26: 0.5 mL via INTRAMUSCULAR
  Filled 2018-11-25: qty 0.5

## 2018-11-25 MED ORDER — LORAZEPAM 0.5 MG PO TABS
0.5000 mg | ORAL_TABLET | Freq: Every day | ORAL | Status: DC
  Administered 2018-11-25: 0.5 mg via ORAL
  Filled 2018-11-25: qty 1

## 2018-11-25 MED ORDER — HEPARIN BOLUS VIA INFUSION
4000.0000 [IU] | Freq: Once | INTRAVENOUS | Status: AC
  Administered 2018-11-25: 4000 [IU] via INTRAVENOUS
  Filled 2018-11-25: qty 4000

## 2018-11-25 MED ORDER — ATORVASTATIN CALCIUM 40 MG PO TABS
40.0000 mg | ORAL_TABLET | Freq: Every day | ORAL | Status: DC
  Administered 2018-11-25: 40 mg via ORAL
  Filled 2018-11-25: qty 1

## 2018-11-25 MED ORDER — ASPIRIN 300 MG RE SUPP: 300.0000 mg | RECTAL | Status: AC

## 2018-11-25 MED ORDER — METOPROLOL TARTRATE 12.5 MG HALF TABLET
12.5000 mg | ORAL_TABLET | Freq: Two times a day (BID) | ORAL | Status: DC
  Administered 2018-11-25 – 2018-11-26 (×2): 12.5 mg via ORAL
  Filled 2018-11-25 (×2): qty 1

## 2018-11-25 MED ORDER — ASPIRIN 81 MG PO CHEW
324.0000 mg | CHEWABLE_TABLET | ORAL | Status: AC
  Administered 2018-11-25: 324 mg via ORAL
  Filled 2018-11-25: qty 4

## 2018-11-25 MED ORDER — ONDANSETRON HCL 4 MG/2ML IJ SOLN
4.0000 mg | Freq: Four times a day (QID) | INTRAMUSCULAR | Status: DC | PRN
  Administered 2018-11-25 – 2018-11-26 (×2): 4 mg via INTRAVENOUS
  Filled 2018-11-25 (×2): qty 2

## 2018-11-25 NOTE — Progress Notes (Signed)
ANTICOAGULATION CONSULT NOTE - Initial Consult  Pharmacy Consult for Heparin Indication: chest pain/ACS  Allergies  Allergen Reactions  . Acetaminophen-Codeine Other (See Comments)    Hand get red, feels like it's on fire  . Morphine And Related     "makes me feel like i'm on fire"  . Azithromycin Itching and Rash    Patient Measurements: Height: 5\' 1"  (154.9 cm) Weight: 159 lb 13.3 oz (72.5 kg) IBW/kg (Calculated) : 47.8   Vital Signs: Temp: 98.7 F (37.1 C) (02/12 1843) Temp Source: Oral (02/12 1843) BP: 158/96 (02/12 1843)  Labs: No results for input(s): HGB, HCT, PLT, APTT, LABPROT, INR, HEPARINUNFRC, HEPRLOWMOCWT, CREATININE, CKTOTAL, CKMB, TROPONINI in the last 72 hours.  CrCl cannot be calculated (Patient's most recent lab result is older than the maximum 21 days allowed.).   Medical History: Past Medical History:  Diagnosis Date  . Acute hepatitis C without mention of hepatic coma(070.51)   . Anemia   . Arthritis    back  . Carpal tunnel syndrome   . Chest pain, unspecified   . Chronic airway obstruction, not elsewhere classified   . Chronic hepatitis C without hepatic coma (HCC) 02/09/2018  . Colitis   . Coronary atherosclerosis of unspecified type of vessel, native or graft   . Disorders of bursae and tendons in shoulder region, unspecified   . Drug-induced skin rash 04/07/2018  . GERD (gastroesophageal reflux disease)   . Hardware complicating wound infection (HCC) 02/09/2018  . Headache    migraines  . Hepatitis    hx of hepatitis C  . Infective otitis externa, unspecified   . Mixed hyperlipidemia   . MRSA infection 02/09/2018  . Nonspecific abnormal results of thyroid function study   . Other bursitis disorders   . Other symptoms involving nervous and musculoskeletal systems(781.99)   . Pneumonia   . Seizures (HCC)    none for 4 years (as of 01/2018) "pseudo seizures"  . Thrush 04/07/2018      Assessment: 62yof admitted with chest pain, SOB  plan stress test in am. Begin heparin drip   Goal of Therapy:  Heparin level 0.3-0.7 units/ml Monitor platelets by anticoagulation protocol: Yes   Plan:  Heparin bolus 4000 uts IV x1  Heparin drip 1000 uts/hr HL, CBC daily   Leota Sauers Pharm.D. CPP, BCPS Clinical Pharmacist [phone] 11/25/2018 7:24 PM

## 2018-11-25 NOTE — H&P (Signed)
Referring Physician:  SHERICE VATTER is an 63 y.o. female.                       Chief Complaint: Chest pain  HPI: 63 year old white female has recurrent chest pain x 2 weeks. She has associated shortness of breath and left arm tingling and numbness. She has PMH of CAD, Tobacco use disorder , Hepatitis C and lumbar spinal fusion surgery.  Past Medical History:  Diagnosis Date  . Acute hepatitis C without mention of hepatic coma(070.51)   . Anemia   . Arthritis    back  . Carpal tunnel syndrome   . Chest pain, unspecified   . Chronic airway obstruction, not elsewhere classified   . Chronic hepatitis C without hepatic coma (HCC) 02/09/2018  . Colitis   . Coronary atherosclerosis of unspecified type of vessel, native or graft   . Disorders of bursae and tendons in shoulder region, unspecified   . Drug-induced skin rash 04/07/2018  . GERD (gastroesophageal reflux disease)   . Hardware complicating wound infection (HCC) 02/09/2018  . Headache    migraines  . Hepatitis    hx of hepatitis C  . Infective otitis externa, unspecified   . Mixed hyperlipidemia   . MRSA infection 02/09/2018  . Nonspecific abnormal results of thyroid function study   . Other bursitis disorders   . Other symptoms involving nervous and musculoskeletal systems(781.99)   . Pneumonia   . Seizures (HCC)    none for 4 years (as of 01/2018) "pseudo seizures"  . Thrush 04/07/2018      Past Surgical History:  Procedure Laterality Date  . ABDOMINAL HYSTERECTOMY    . CARDIAC CATHETERIZATION    . CARDIAC CATHETERIZATION N/A 06/25/2016   Procedure: Left Heart Cath and Coronary Angiography;  Surgeon: Orpah Cobb, MD;  Location: [facility];  Service: Cardiovascular;  Laterality: N/A;  . CESAREAN SECTION    . CHOLECYSTECTOMY    . COLONOSCOPY    . IR FLUORO GUIDED NEEDLE PLC ASPIRATION/INJECTION LOC  12/27/2017  . LUMBAR FUSION  01/13/2018   Transforaminal lumbar interbody fusion L4-5 with screws, cages and rods,  local bone graft, Allograft, Vivigen, Decompression L4-5 and L5-S1  . TONSILLECTOMY      Family History  Problem Relation Age of Onset  . Crohn's disease Daughter 63       colon resection  . Irritable bowel syndrome Daughter   . Colonic polyp Mother   . Thyroid disease Mother   . COPD Mother   . Uterine cancer Maternal Grandmother   . Heart murmur Daughter        SVT  . Colon cancer Neg Hx    Social History:  reports that she quit smoking about 10 months ago. Her smoking use included cigarettes. She has never used smokeless tobacco. She reports that she does not drink alcohol or use drugs.  Allergies:  Allergies  Allergen Reactions  . Acetaminophen-Codeine Other (See Comments)    Hand get red, feels like it's on fire  . Morphine And Related     "makes me feel like i'm on fire"  . Azithromycin Itching and Rash    Medications Prior to Admission  Medication Sig Dispense Refill  . doxycycline (VIBRAMYCIN) 100 MG capsule Take 100 mg by mouth 2 (two) times daily.    . fluconazole (DIFLUCAN) 100 MG tablet Take 1 tablet (100 mg total) by mouth daily. 10 tablet 0  . LORazepam (ATIVAN)  1 MG tablet   0  . oxyCODONE (OXY IR/ROXICODONE) 5 MG immediate release tablet   0  . sertraline (ZOLOFT) 50 MG tablet Take 50 mg by mouth daily.    . Sofosbuvir-Velpatasvir (EPCLUSA) 400-100 MG TABS Take 1 tablet by mouth daily. 28 tablet 2  . sulfamethoxazole-trimethoprim (BACTRIM DS,SEPTRA DS) 800-160 MG tablet Take 1 tablet by mouth 2 (two) times daily. 60 tablet 5    No results found for this or any previous visit (from the past 48 hour(s)). No results found.  Review Of Systems Constitutional: No fever, chills, weight loss or gain. Eyes: No vision change, wears glasses. No discharge or pain. Ears: No hearing loss, No tinnitus. Respiratory: No asthma, COPD, pneumonias. Positive shortness of breath. No hemoptysis. Cardiovascular: Positive chest pain, palpitation, leg edema. Gastrointestinal:  NPositive nausea, vomiting, diarrhea, constipation. No GI bleed. No hepatitis. Genitourinary: No dysuria, hematuria, kidney stone. No incontinance. Neurological: Positive headache, stroke, seizures.  Psychiatry: No psych facility admission for anxiety, depression, suicide. No detox. Skin: No rash. Musculoskeletal: Positive joint pain, no fibromyalgia. Positive neck pain, back pain. Lymphadenopathy: No lymphadenopathy. Hematology: No anemia or easy bruising.   There were no vitals taken for this visit. There is no height or weight on file to calculate BMI. General appearance: alert, cooperative, appears stated age and no distress Head: Normocephalic, atraumatic. Eyes: Blue eyes, pink conjunctiva, corneas clear. PERRL, EOM's intact. Neck: No adenopathy, no carotid bruit, no JVD, supple, symmetrical, trachea midline and thyroid not enlarged. Resp: Clear to auscultation bilaterally. Cardio: Regular rate and rhythm, S1, S2 normal, II/VI systolic murmur, no click, rub or gallop GI: Soft, non-tender; bowel sounds normal; no organomegaly. Extremities: No edema, cyanosis or clubbing. Skin: Warm and dry.  Neurologic: Alert and oriented X 3, normal strength. Normal coordination and slow gait.  Assessment/Plan Acute coronary syndrome CAD Tobacco use disorder H/O hepatitis C S/P lumbar spine surgery  Place in observation. NM myocardial perfusion with possible cardiac cath.  Ricki RodriguezAjay S Hampton Cost, MD  11/25/2018, 6:25 PM

## 2018-11-26 ENCOUNTER — Observation Stay (HOSPITAL_COMMUNITY): Payer: Self-pay

## 2018-11-26 LAB — CBC
HCT: 40.9 % (ref 36.0–46.0)
Hemoglobin: 12.9 g/dL (ref 12.0–15.0)
MCH: 28.4 pg (ref 26.0–34.0)
MCHC: 31.5 g/dL (ref 30.0–36.0)
MCV: 89.9 fL (ref 80.0–100.0)
PLATELETS: 312 10*3/uL (ref 150–400)
RBC: 4.55 MIL/uL (ref 3.87–5.11)
RDW: 15.3 % (ref 11.5–15.5)
WBC: 9 10*3/uL (ref 4.0–10.5)
nRBC: 0 % (ref 0.0–0.2)

## 2018-11-26 LAB — LIPID PANEL
Cholesterol: 236 mg/dL — ABNORMAL HIGH (ref 0–200)
HDL: 58 mg/dL (ref >40)
LDL Cholesterol: 161 mg/dL — ABNORMAL HIGH (ref 0–99)
Total CHOL/HDL Ratio: 4.1 RATIO
Triglycerides: 87 mg/dL (ref <150)
VLDL: 17 mg/dL (ref 0–40)

## 2018-11-26 LAB — HEPARIN LEVEL (UNFRACTIONATED): Heparin Unfractionated: 0.73 IU/mL — ABNORMAL HIGH (ref 0.30–0.70)

## 2018-11-26 LAB — PROTIME-INR
INR: 1.07
Prothrombin Time: 13.8 seconds (ref 11.4–15.2)

## 2018-11-26 LAB — MRSA PCR SCREENING: MRSA by PCR: NEGATIVE

## 2018-11-26 LAB — BASIC METABOLIC PANEL
Anion gap: 12 (ref 5–15)
BUN: 9 mg/dL (ref 8–23)
CO2: 22 mmol/L (ref 22–32)
Calcium: 8.5 mg/dL — ABNORMAL LOW (ref 8.9–10.3)
Chloride: 106 mmol/L (ref 98–111)
Creatinine, Ser: 0.9 mg/dL (ref 0.44–1.00)
GFR calc Af Amer: >60 mL/min (ref >60)
GFR calc non Af Amer: >60 mL/min (ref >60)
Glucose, Bld: 95 mg/dL (ref 70–99)
Potassium: 3.9 mmol/L (ref 3.5–5.1)
Sodium: 140 mmol/L (ref 135–145)

## 2018-11-26 LAB — TROPONIN I
Troponin I: <0.03 ng/mL (ref <0.03)
Troponin I: <0.03 ng/mL (ref <0.03)

## 2018-11-26 MED ORDER — METOPROLOL TARTRATE 25 MG PO TABS: 12.5000 mg | ORAL_TABLET | Freq: Two times a day (BID) | ORAL | 3 refills | Status: DC

## 2018-11-26 MED ORDER — ACETAMINOPHEN 325 MG PO TABS: 650.0000 mg | ORAL_TABLET | Freq: Four times a day (QID) | ORAL | Status: DC | PRN

## 2018-11-26 MED ORDER — ASPIRIN 81 MG PO TBEC: 81.0000 mg | DELAYED_RELEASE_TABLET | Freq: Every day | ORAL | Status: DC

## 2018-11-26 MED ORDER — TECHNETIUM TC 99M TETROFOSMIN IV KIT
30.0000 | PACK | Freq: Once | INTRAVENOUS | Status: AC | PRN
  Administered 2018-11-26: 30 via INTRAVENOUS

## 2018-11-26 MED ORDER — REGADENOSON 0.4 MG/5ML IV SOLN
INTRAVENOUS | Status: AC
  Administered 2018-11-26: 0.4 mg via INTRAVENOUS
  Filled 2018-11-26: qty 5

## 2018-11-26 MED ORDER — ATORVASTATIN CALCIUM 40 MG PO TABS: 40.0000 mg | ORAL_TABLET | Freq: Every day | ORAL | 3 refills | Status: DC

## 2018-11-26 MED ORDER — AMLODIPINE BESYLATE 2.5 MG PO TABS: 2.5000 mg | ORAL_TABLET | Freq: Every day | ORAL | Status: DC

## 2018-11-26 MED ORDER — REGADENOSON 0.4 MG/5ML IV SOLN
0.4000 mg | Freq: Once | INTRAVENOUS | Status: AC
  Administered 2018-11-26: 0.4 mg via INTRAVENOUS
  Filled 2018-11-26: qty 5

## 2018-11-26 MED ORDER — TECHNETIUM TC 99M TETROFOSMIN IV KIT
10.0000 | PACK | Freq: Once | INTRAVENOUS | Status: AC | PRN
  Administered 2018-11-26: 10 via INTRAVENOUS

## 2018-11-26 MED ORDER — ONDANSETRON HCL 4 MG PO TABS
4.0000 mg | ORAL_TABLET | Freq: Three times a day (TID) | ORAL | Status: DC | PRN
  Administered 2018-11-26: 4 mg via ORAL
  Filled 2018-11-26: qty 1

## 2018-11-26 MED ORDER — HYDROMORPHONE HCL 1 MG/ML IJ SOLN
0.5000 mg | INTRAMUSCULAR | Status: DC | PRN
  Administered 2018-11-26 (×2): 0.5 mg via INTRAVENOUS
  Filled 2018-11-26 (×2): qty 1

## 2018-11-26 MED ORDER — NITROGLYCERIN 0.4 MG SL SUBL: 0.4000 mg | SUBLINGUAL_TABLET | SUBLINGUAL | 1 refills | Status: DC | PRN

## 2018-11-26 MED ORDER — AMLODIPINE BESYLATE 2.5 MG PO TABS: 2.5000 mg | ORAL_TABLET | Freq: Every day | ORAL | 3 refills | Status: DC

## 2018-11-26 NOTE — Discharge Summary (Signed)
Physician Discharge Summary  Patient ID: [name] MRN: [id number] DOB/AGE: 1956-05-18 63 y.o.  Admit date: 11/25/2018 Discharge date: 11/26/2018  Admission Diagnoses: Acute coronary syndrome CAD Tobacco use disorder H/O hepatitis C S/P lumbar spine surgery  Discharge Diagnoses:  Principle Problem:   Chest pain at rest  Active Problems:    Hyperlipidemia   Tobacco use disorder   Costochondritis   H/O hepatitis C   S/P lumbar spine surgery  Discharged Condition: fair  Hospital Course: 63 years old female has recurrent retrosternal chest pain increased by deep breathing, shortness of breath and left arm tingling. She has PMH of CAD, tobacco use disorder, Hepatitis C and lumbar spine fusion surgery.  Her troponin levels were normal. She had no reversible ischemia on nuclear stress test. Atorvastatin, Metoprolol, amlodipine and aspirin were added. She will follow heart healthy diiet, daily activities and medications. She was discharged home in stable condition with follow up by me in 1 month.  Consults: cardiology  Significant Diagnostic Studies: labs: Normal CBC, BMET except borderline high sugar level. Normal Troponin I. Elevated LDL cholesterol level of 161 mg.   Treatments: cardiac meds: metoprolol, amlodipine, Aspirin and Atorvastatin.  Discharge Exam: Blood pressure 106/63, pulse (!) 59, temperature 98.3 F (36.8 C), temperature source Oral, resp. rate 18, height 5\' 1"  (1.549 m), weight 72.1 kg, SpO2 98 %. General appearance: alert, cooperative and appears stated age. Head: Normocephalic, atraumatic. Eyes: Blue eyes, pink conjunctiva, corneas clear. PERRL, EOM's intact.  Neck: No adenopathy, no carotid bruit, no JVD, supple, symmetrical, trachea midline and thyroid not enlarged. Resp: Clear to auscultation bilaterally. Chest wall tender on palpation. Cardio: Regular rate and rhythm, S1, S2 normal, II/VI systolic murmur, no click, rub or gallop. GI: Soft, non-tender;  bowel sounds normal; no organomegaly. Extremities: No edema, cyanosis or clubbing. Skin: Warm and dry.  Neurologic: Alert and oriented X 3, normal strength and tone. Normal coordination and gait.  Disposition: Discharge disposition: 01-Home or Self Care        Allergies as of 11/26/2018      Reactions   Acetaminophen-codeine Other (See Comments)   Hand get red, feels like it's on fire   Morphine And Related    "makes me feel like i'm on fire"   Azithromycin Itching, Rash      Medication List    STOP taking these medications   doxycycline 100 MG capsule Commonly known as:  VIBRAMYCIN   fluconazole 100 MG tablet Commonly known as:  DIFLUCAN   LORazepam 1 MG tablet Commonly known as:  ATIVAN   oxyCODONE 5 MG immediate release tablet Commonly known as:  Oxy IR/ROXICODONE   sertraline 50 MG tablet Commonly known as:  ZOLOFT   Sofosbuvir-Velpatasvir 400-100 MG Tabs Commonly known as:  EPCLUSA   sulfamethoxazole-trimethoprim 800-160 MG tablet Commonly known as:  BACTRIM DS,SEPTRA DS     TAKE these medications   amLODipine 2.5 MG tablet Commonly known as:  NORVASC Take 1 tablet (2.5 mg total) by mouth daily.   aspirin 81 MG EC tablet Take 1 tablet (81 mg total) by mouth daily. Start taking on:  November 27, 2018   atorvastatin 40 MG tablet Commonly known as:  LIPITOR Take 1 tablet (40 mg total) by mouth daily at 6 PM.   metoprolol tartrate 25 MG tablet Commonly known as:  LOPRESSOR Take 0.5 tablets (12.5 mg total) by mouth 2 (two) times daily.   nitroGLYCERIN 0.4 MG SL tablet Commonly known as:  NITROSTAT Place 1 tablet (  0.4 mg total) under the tongue every 5 (five) minutes x 3 doses as needed for chest pain.      Follow-up Information    Orpah Cobb, MD. Schedule an appointment as soon as possible for a visit in 1 week(s).   Specialty:  Cardiology Contact information: [address] [place] [address] [phone]            Signed: Ricki Rodriguez 11/26/2018, 12:19 PM

## 2018-11-26 NOTE — Plan of Care (Signed)
  Problem: Education: Goal: Understanding of CV disease, CV risk reduction, and recovery process will improve Outcome: Progressing Goal: Individualized Educational Video(s) Outcome: Progressing   Problem: Activity: Goal: Ability to return to baseline activity level will improve Outcome: Progressing   

## 2018-11-26 NOTE — Care Management Note (Signed)
Case Management Note  Patient Details  Name: LEONILDA CABREROS MRN: [id number] Date of Birth: 04-07-1956  Subjective/Objective:  Pt presented for chest pain. Traveling from [place]- wants to see Dr. Algie Coffer. Pt is without insurance right now, however not needing assistance from CM at this time.                  Action/Plan: No home needs identified.   Expected Discharge Date:  11/26/18               Expected Discharge Plan:  Home/Self Care  In-House Referral:  NA  Discharge planning Services  CM Consult  Post Acute Care Choice:  NA Choice offered to:  NA  DME Arranged:  N/A DME Agency:  NA  HH Arranged:  NA HH Agency:  NA  Status of Service:  Completed, signed off  If discussed at Long Length of Stay Meetings, dates discussed:    Additional Comments:  Gala Lewandowsky, RN 11/26/2018, 12:20 PM

## 2018-11-26 NOTE — Discharge Instructions (Signed)

## 2018-11-27 LAB — HEMOGLOBIN A1C
Hgb A1c MFr Bld: 5.9 % — ABNORMAL HIGH (ref 4.8–5.6)
Mean Plasma Glucose: 123 mg/dL

## 2019-09-07 ENCOUNTER — Telehealth: Payer: Self-pay | Admitting: Specialist

## 2019-09-07 NOTE — Telephone Encounter (Signed)
Patient called wanting to know if Dr. Louanne Skye will send a referral to a pain management doctor or will he need to see her again before he would make that referral.  CB#[phone].  Thank you.

## 2019-09-07 NOTE — Telephone Encounter (Signed)
Per JN ok to send to Pain management

## 2019-09-08 ENCOUNTER — Other Ambulatory Visit: Payer: Self-pay | Admitting: Radiology

## 2019-09-08 DIAGNOSIS — M5416 Radiculopathy, lumbar region: Secondary | ICD-10-CM

## 2019-09-08 DIAGNOSIS — M4316 Spondylolisthesis, lumbar region: Secondary | ICD-10-CM

## 2019-09-08 DIAGNOSIS — M4326 Fusion of spine, lumbar region: Secondary | ICD-10-CM

## 2019-09-08 NOTE — Telephone Encounter (Signed)
I Put the order in for pain management and I called and advised the patient this has been done.

## 2020-12-06 ENCOUNTER — Ambulatory Visit: Payer: Self-pay

## 2020-12-06 ENCOUNTER — Ambulatory Visit (INDEPENDENT_AMBULATORY_CARE_PROVIDER_SITE_OTHER): Payer: Medicare Other | Admitting: Surgery

## 2020-12-06 ENCOUNTER — Encounter: Payer: Self-pay | Admitting: Surgery

## 2020-12-06 VITALS — BP 121/68 | HR 67 | Ht 61.0 in | Wt 159.0 lb

## 2020-12-06 DIAGNOSIS — M5416 Radiculopathy, lumbar region: Secondary | ICD-10-CM | POA: Diagnosis not present

## 2020-12-06 DIAGNOSIS — M4326 Fusion of spine, lumbar region: Secondary | ICD-10-CM | POA: Diagnosis not present

## 2020-12-06 DIAGNOSIS — M4726 Other spondylosis with radiculopathy, lumbar region: Secondary | ICD-10-CM

## 2020-12-06 NOTE — Progress Notes (Signed)
Office Visit Note   Patient: [name]           Date of Birth: 07/30/1956           MRN: [id number] Visit Date: 12/06/2020              Requested by: Orpah Cobb, MD [address] [place],  [place] [address] PCP: Orpah Cobb, MD   Assessment & Plan: Visit Diagnoses:  1. Fusion of lumbar spine   2. Lumbar radiculopathy   3. Other spondylosis with radiculopathy, lumbar region     Plan: Patient was asking about narcotic medication today for pain.  None was given.  Advised patient that she could get back in contact with Dr. Algie Coffer who she states previously wrote her a prescription for oxycodone.  With her worsening pain I will schedule lumbar MRI to rule out HNP/stenosis.  Follow-up with Dr. Otelia Sergeant in a few weeks for recheck to discuss results and further treatment options.  Patient stated that she would like to get into pain management and I advised her that this is something that she can discuss further at return office visit with Dr. Otelia Sergeant.  Follow-Up Instructions: Return in about 3 weeks (around 12/27/2020) for WITH DR NITKA TO REVIEW LUMBAR MRI SCAN.   Orders:  Orders Placed This Encounter  Procedures  . XR Lumbar Spine 2-3 Views   No orders of the defined types were placed in this encounter.     Procedures: No procedures performed   Clinical Data: No additional findings.   Subjective: Chief Complaint  Patient presents with  . Lower Back - Pain    HPI 65 year old white female who is status post L4-5 fusion and L4-5 and L5-S1 decompression by Dr. Otelia Sergeant January 13, 2018 comes in with complaints of low back pain and left lower extremity radiculopathy.  I had last seen patient when she was 3 months postop and she was supposed to come back and see Dr. Otelia Sergeant 6 months after that visit and did not return.  Last note in the chart from our office September 08, 2019 was a telephone encounter documenting that a referral was made to pain management.  Patient states that  she was never contacted regarding this.  She also reports that she had moved down to [place] at some point temporarily.  Low back pain and left lower extremity radiculopathy to the knee worsening over the last 3 months.  No injury reported.  States that she does not currently have a primary care provider.  She was seen by Dr. Algie Coffer who prescribed Zanaflex and gabapentin.  States that he also gave her a handwritten prescription for oxycodone but the pharmacy did not fill this because it was not sent electronically.  Patient requesting pain medication from our office today.  Not complain of any right lower extremity symptoms. Review of Systems No current cardiac pulmonary GI GU issues  Objective: Vital Signs: BP 121/68   Pulse 67   Ht 5\' 1"  (1.549 m)   Wt 159 lb (72.1 kg)   BMI 30.04 kg/m   Physical Exam HENT:     Head: Normocephalic.  Eyes:     Extraocular Movements: Extraocular movements intact.     Pupils: Pupils are equal, round, and reactive to light.  Pulmonary:     Effort: No respiratory distress.  Musculoskeletal:     Comments: Gait is antalgic.  Positive left lumbar paraspinal tenderness.  Positive left-sided notch tenderness.  Discomfort over the left  hip greater trochanter bursa.  Negative logroll.  Some discomfort with left straight leg raise.  Negative on the right side.  No focal motor deficits.  Neurological:     General: No focal deficit present.     Mental Status: She is alert and oriented to person, place, and time.     Ortho Exam  Specialty Comments:  No specialty comments available.  Imaging: No results found.   PMFS History: Patient Active Problem List   Diagnosis Date Noted  . Acute coronary syndrome (HCC) 11/25/2018  . Thrush 04/07/2018  . Drug-induced skin rash 04/07/2018  . Chronic hepatitis C without hepatic coma (HCC) 02/09/2018  . Hardware complicating wound infection (HCC) 02/09/2018  . MRSA infection 02/09/2018  . Anemia due to blood loss,  acute 01/16/2018    Class: Acute  . Infection of lumbar spine (HCC) 01/16/2018    Class: Acute  . Spinal stenosis of lumbar region 01/13/2018    Class: Chronic  . Spondylolisthesis, lumbar region 01/13/2018    Class: Chronic  . Fusion of lumbar spine 01/13/2018  . Low back pain 12/23/2017  . Tobacco use disorder 06/24/2016  . Weight gain 01/02/2012  . Stool incontinence 11/19/2011  . Abdominal pain, right lower quadrant 11/19/2011  . EUSTACHIAN TUBE DYSFUNCTION 08/10/2009  . SINUSITIS, ACUTE 08/10/2009  . TRANSIENT GLOBAL AMNESIA 01/12/2009  . HIP PAIN, RIGHT 10/04/2008  . HEPATITIS C 07/28/2008  . COPD, 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  . HYPERLIPIDEMIA, MIXED, WITH LOW HDL 08/30/2007  . OTITIS EXTERNA, INFECTIVE NOS 08/27/2007  . ROTATOR CUFF SYNDROME, RIGHT 08/27/2007  . Chest pain at rest 08/27/2007  . BURSITIS NOS 07/13/2007  . MUSCULOSKELETAL PAIN 07/13/2007  . CARPAL TUNNEL SYNDROME, BILATERAL 04/10/2007   Past Medical History:  Diagnosis Date  . Acute hepatitis C without mention of hepatic coma(070.51)   . Anemia   . Arthritis    back  . Carpal tunnel syndrome   . Chest pain, unspecified   . Chronic airway obstruction, not elsewhere classified   . Chronic hepatitis C without hepatic coma (HCC) 02/09/2018  . Colitis   . Coronary atherosclerosis of unspecified type of vessel, native or graft   . Disorders of bursae and tendons in shoulder region, unspecified   . Drug-induced skin rash 04/07/2018  . GERD (gastroesophageal reflux disease)   . Hardware complicating wound infection (HCC) 02/09/2018  . Headache    migraines  . Hepatitis    hx of hepatitis C  . Infective otitis externa, unspecified   . Mixed hyperlipidemia   . MRSA infection 02/09/2018  . Nonspecific abnormal results of thyroid function study   . Other bursitis disorders   . Other symptoms involving nervous and musculoskeletal systems(781.99)   . Pneumonia   . Seizures (HCC)    none for 4 years (as of 01/2018)  "pseudo seizures"  . Thrush 04/07/2018    Family History  Problem Relation Age of Onset  . Crohn's disease Daughter 34       colon resection  . Irritable bowel syndrome Daughter   . Colonic polyp Mother   . Thyroid disease Mother   . COPD Mother   . Uterine cancer Maternal Grandmother   . Heart murmur Daughter        SVT  . Colon cancer Neg Hx     Past Surgical History:  Procedure Laterality Date  . ABDOMINAL HYSTERECTOMY    . CARDIAC CATHETERIZATION    . CARDIAC CATHETERIZATION N/A 06/25/2016   Procedure: Left Heart Cath and  Coronary Angiography;  Surgeon: Orpah Cobb, MD;  Location: [facility];  Service: Cardiovascular;  Laterality: N/A;  . CESAREAN SECTION    . CHOLECYSTECTOMY    . COLONOSCOPY    . IR FLUORO GUIDED NEEDLE PLC ASPIRATION/INJECTION LOC  12/27/2017  . LUMBAR FUSION  01/13/2018   Transforaminal lumbar interbody fusion L4-5 with screws, cages and rods, local bone graft, Allograft, Vivigen, Decompression L4-5 and L5-S1  . TONSILLECTOMY     Social History   Occupational History  . Occupation: care giver  Tobacco Use  . Smoking status: Former Smoker    Types: Cigarettes    Quit date: 01/07/2018    Years since quitting: 2.9  . Smokeless tobacco: Never Used  . Tobacco comment: 2 cigarettes a day  Vaping Use  . Vaping Use: Every day  Substance and Sexual Activity  . Alcohol use: No  . Drug use: No  . Sexual activity: Yes    Partners: Male

## 2020-12-14 ENCOUNTER — Ambulatory Visit: Payer: Self-pay | Admitting: Specialist

## 2020-12-23 ENCOUNTER — Ambulatory Visit
Admission: RE | Admit: 2020-12-23 | Discharge: 2020-12-23 | Disposition: A | Discharge status: Home / Self Care | Payer: Medicare Other | Source: Ambulatory Visit | Attending: Surgery | Admitting: Surgery

## 2020-12-23 DIAGNOSIS — M5416 Radiculopathy, lumbar region: Secondary | ICD-10-CM

## 2020-12-23 DIAGNOSIS — M48061 Spinal stenosis, lumbar region without neurogenic claudication: Secondary | ICD-10-CM | POA: Diagnosis not present

## 2020-12-23 DIAGNOSIS — M4326 Fusion of spine, lumbar region: Secondary | ICD-10-CM

## 2020-12-23 DIAGNOSIS — M545 Low back pain, unspecified: Secondary | ICD-10-CM | POA: Diagnosis not present

## 2020-12-27 ENCOUNTER — Encounter: Payer: Self-pay | Admitting: Specialist

## 2020-12-27 ENCOUNTER — Other Ambulatory Visit: Payer: Self-pay

## 2020-12-27 ENCOUNTER — Ambulatory Visit: Payer: Medicare Other | Admitting: Specialist

## 2020-12-27 VITALS — BP 144/83 | HR 73 | Ht 61.0 in | Wt 159.0 lb

## 2020-12-27 DIAGNOSIS — M4316 Spondylolisthesis, lumbar region: Secondary | ICD-10-CM | POA: Diagnosis not present

## 2020-12-27 DIAGNOSIS — M5416 Radiculopathy, lumbar region: Secondary | ICD-10-CM

## 2020-12-27 DIAGNOSIS — M4726 Other spondylosis with radiculopathy, lumbar region: Secondary | ICD-10-CM | POA: Diagnosis not present

## 2020-12-27 DIAGNOSIS — M4326 Fusion of spine, lumbar region: Secondary | ICD-10-CM | POA: Diagnosis not present

## 2020-12-27 DIAGNOSIS — M4155 Other secondary scoliosis, thoracolumbar region: Secondary | ICD-10-CM

## 2020-12-27 MED ORDER — GABAPENTIN 100 MG PO CAPS: 100.0000 mg | ORAL_CAPSULE | Freq: Every day | ORAL | 1 refills | Status: DC

## 2020-12-27 MED ORDER — HYDROCODONE-ACETAMINOPHEN 7.5-325 MG PO TABS: 1.0000 | ORAL_TABLET | Freq: Four times a day (QID) | ORAL | 0 refills | Status: DC | PRN

## 2020-12-27 MED ORDER — METHYLPREDNISOLONE 4 MG PO TBPK: ORAL_TABLET | ORAL | 0 refills | Status: DC

## 2020-12-27 NOTE — Patient Instructions (Signed)
Avoid bending, stooping and avoid lifting weights greater than 10 lbs. Avoid prolong standing and walking. Avoid frequent bending and stooping  No lifting greater than 10 lbs. May use ice or moist heat for pain. Weight loss is of benefit. Handicap license is approved. Dr. Newton's secretary/Assistant will call to arrange for epidural steroid injection  

## 2020-12-27 NOTE — Progress Notes (Signed)
Office Visit Note   Patient: [name]           Date of Birth: 06-Sep-1956           MRN: [id number] Visit Date: 12/27/2020              Requested by: Orpah Cobb, MD [address],  [place] [address] PCP: Orpah Cobb, MD   Assessment & Plan: Visit Diagnoses:  1. Other spondylosis with radiculopathy, lumbar region   2. Spondylolisthesis, lumbar region   3. Fusion of lumbar spine   4. Lumbar radiculopathy   5. Other secondary scoliosis, thoracolumbar region     Plan: Avoid bending, stooping and avoid lifting weights greater than 10 lbs. Avoid prolong standing and walking. Avoid frequent bending and stooping  No lifting greater than 10 lbs. May use ice or moist heat for pain. Weight loss is of benefit. Handicap license is approved. Dr. Ephesus Blas secretary/Assistant will call to arrange for epidural steroid injection   Follow-Up Instructions: Return in about 3 weeks (around 01/17/2021).   Orders:  Orders Placed This Encounter  Procedures  . Ambulatory referral to Physical Medicine Rehab   Meds ordered this encounter  Medications  . HYDROcodone-acetaminophen (NORCO) 7.5-325 MG tablet    Sig: Take 1 tablet by mouth every 6 (six) hours as needed for moderate pain.    Dispense:  30 tablet    Refill:  0  . gabapentin (NEURONTIN) 100 MG capsule    Sig: Take 1 capsule (100 mg total) by mouth at bedtime.    Dispense:  30 capsule    Refill:  1  . methylPREDNISolone (MEDROL DOSEPAK) 4 MG TBPK tablet    Sig: Take as directed 6 day dose pak.    Dispense:  21 tablet    Refill:  0      Procedures: No procedures performed   Clinical Data: No additional findings.   Subjective: Chief Complaint  Patient presents with  . Lower Back - Pain    65 year old female with history of L4-5 TLIF for stenosis and slip. The past 3-4 months she has been having pain into the left leg thigh all around and pain that radiates down the back of the Left buttock and  thigh to the knee. There is intermittant numbness and tingling. There is pain that is worse with standing and walking. Pain is improved with sitting. Saw her cardiologist and was  Given oxycodone and flexeril. Saw Zonia Kief 3 weeks ago and an MRI was ordered.    Review of Systems  Constitutional: Negative.   HENT: Negative.   Eyes: Negative.   Respiratory: Negative.   Cardiovascular: Negative.   Gastrointestinal: Negative.   Endocrine: Negative.   Genitourinary: Negative.   Musculoskeletal: Negative.   Skin: Negative.   Allergic/Immunologic: Negative.   Neurological: Negative.   Hematological: Negative.   Psychiatric/Behavioral: Negative.      Objective: Vital Signs: BP (!) 144/83 (BP Location: Left Arm, Patient Position: Sitting)   Pulse 73   Ht 5\' 1"  (1.549 m)   Wt 159 lb (72.1 kg)   BMI 30.04 kg/m   Physical Exam Constitutional:      Appearance: She is well-developed.  HENT:     Head: Normocephalic and atraumatic.  Eyes:     Pupils: Pupils are equal, round, and reactive to light.  Pulmonary:     Effort: Pulmonary effort is normal.     Breath sounds: Normal breath sounds.  Abdominal:  General: Bowel sounds are normal.     Palpations: Abdomen is soft.  Musculoskeletal:     Cervical back: Normal range of motion and neck supple.     Lumbar back: Negative right straight leg raise test and negative left straight leg raise test.  Skin:    General: Skin is warm and dry.  Neurological:     Mental Status: She is alert and oriented to person, place, and time.  Psychiatric:        Behavior: Behavior normal.        Thought Content: Thought content normal.        Judgment: Judgment normal.     Back Exam   Tenderness  The patient is experiencing tenderness in the lumbar.  Range of Motion  Extension: abnormal  Flexion: abnormal  Lateral bend right: abnormal  Lateral bend left: abnormal  Rotation right: abnormal  Rotation left: abnormal   Muscle Strength   Right Quadriceps:  5/5  Left Quadriceps:  4/5  Right Hamstrings:  5/5  Left Hamstrings:  5/5   Tests  Straight leg raise right: negative Straight leg raise left: negative  Reflexes  Patellar: 0/4 Achilles: 0/4  Other  Toe walk: abnormal Heel walk: abnormal Sensation: decreased Gait: normal  Erythema: no back redness      Specialty Comments:  No specialty comments available.  Imaging: No results found.   PMFS History: Patient Active Problem List   Diagnosis Date Noted  . Anemia due to blood loss, acute 01/16/2018    Priority: High    Class: Acute  . Infection of lumbar spine (HCC) 01/16/2018    Priority: High    Class: Acute  . Spinal stenosis of lumbar region 01/13/2018    Priority: High    Class: Chronic  . Spondylolisthesis, lumbar region 01/13/2018    Priority: High    Class: Chronic  . Acute coronary syndrome (HCC) 11/25/2018  . Thrush 04/07/2018  . Drug-induced skin rash 04/07/2018  . Chronic hepatitis C without hepatic coma (HCC) 02/09/2018  . Hardware complicating wound infection (HCC) 02/09/2018  . MRSA infection 02/09/2018  . Fusion of lumbar spine 01/13/2018  . Low back pain 12/23/2017  . Tobacco use disorder 06/24/2016  . Weight gain 01/02/2012  . Stool incontinence 11/19/2011  . Abdominal pain, right lower quadrant 11/19/2011  . EUSTACHIAN TUBE DYSFUNCTION 08/10/2009  . SINUSITIS, ACUTE 08/10/2009  . TRANSIENT GLOBAL AMNESIA 01/12/2009  . HIP PAIN, RIGHT 10/04/2008  . HEPATITIS C 07/28/2008  . COPD, 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  . HYPERLIPIDEMIA, MIXED, WITH LOW HDL 08/30/2007  . OTITIS EXTERNA, INFECTIVE NOS 08/27/2007  . ROTATOR CUFF SYNDROME, RIGHT 08/27/2007  . Chest pain at rest 08/27/2007  . BURSITIS NOS 07/13/2007  . MUSCULOSKELETAL PAIN 07/13/2007  . CARPAL TUNNEL SYNDROME, BILATERAL 04/10/2007   Past Medical History:  Diagnosis Date  . Acute hepatitis C without mention of hepatic coma(070.51)   . Anemia   . Arthritis    back  .  Carpal tunnel syndrome   . Chest pain, unspecified   . Chronic airway obstruction, not elsewhere classified   . Chronic hepatitis C without hepatic coma (HCC) 02/09/2018  . Colitis   . Coronary atherosclerosis of unspecified type of vessel, native or graft   . Disorders of bursae and tendons in shoulder region, unspecified   . Drug-induced skin rash 04/07/2018  . GERD (gastroesophageal reflux disease)   . Hardware complicating wound infection (HCC) 02/09/2018  . Headache    migraines  . Hepatitis  hx of hepatitis C  . Infective otitis externa, unspecified   . Mixed hyperlipidemia   . MRSA infection 02/09/2018  . Nonspecific abnormal results of thyroid function study   . Other bursitis disorders   . Other symptoms involving nervous and musculoskeletal systems(781.99)   . Pneumonia   . Seizures (HCC)    none for 4 years (as of 01/2018) "pseudo seizures"  . Thrush 04/07/2018    Family History  Problem Relation Age of Onset  . Crohn's disease Daughter 71       colon resection  . Irritable bowel syndrome Daughter   . Colonic polyp Mother   . Thyroid disease Mother   . COPD Mother   . Uterine cancer Maternal Grandmother   . Heart murmur Daughter        SVT  . Colon cancer Neg Hx     Past Surgical History:  Procedure Laterality Date  . ABDOMINAL HYSTERECTOMY    . CARDIAC CATHETERIZATION    . CARDIAC CATHETERIZATION N/A 06/25/2016   Procedure: Left Heart Cath and Coronary Angiography;  Surgeon: Orpah Cobb, MD;  Location: [facility];  Service: Cardiovascular;  Laterality: N/A;  . CESAREAN SECTION    . CHOLECYSTECTOMY    . COLONOSCOPY    . IR FLUORO GUIDED NEEDLE PLC ASPIRATION/INJECTION LOC  12/27/2017  . LUMBAR FUSION  01/13/2018   Transforaminal lumbar interbody fusion L4-5 with screws, cages and rods, local bone graft, Allograft, Vivigen, Decompression L4-5 and L5-S1  . TONSILLECTOMY     Social History   Occupational History  . Occupation: care giver  Tobacco Use   . Smoking status: Former Smoker    Types: Cigarettes    Quit date: 01/07/2018    Years since quitting: 2.9  . Smokeless tobacco: Never Used  . Tobacco comment: 2 cigarettes a day  Vaping Use  . Vaping Use: Every day  Substance and Sexual Activity  . Alcohol use: No  . Drug use: No  . Sexual activity: Yes    Partners: Male

## 2021-01-12 DIAGNOSIS — I340 Nonrheumatic mitral (valve) insufficiency: Secondary | ICD-10-CM | POA: Diagnosis not present

## 2021-01-12 DIAGNOSIS — R072 Precordial pain: Secondary | ICD-10-CM | POA: Diagnosis not present

## 2021-01-12 DIAGNOSIS — I2510 Atherosclerotic heart disease of native coronary artery without angina pectoris: Secondary | ICD-10-CM | POA: Diagnosis not present

## 2021-01-12 DIAGNOSIS — M48062 Spinal stenosis, lumbar region with neurogenic claudication: Secondary | ICD-10-CM | POA: Diagnosis not present

## 2021-01-17 ENCOUNTER — Encounter: Payer: Self-pay | Admitting: Physical Medicine and Rehabilitation

## 2021-01-17 ENCOUNTER — Other Ambulatory Visit: Payer: Self-pay

## 2021-01-17 ENCOUNTER — Ambulatory Visit: Payer: Self-pay

## 2021-01-17 ENCOUNTER — Ambulatory Visit (INDEPENDENT_AMBULATORY_CARE_PROVIDER_SITE_OTHER): Payer: Medicare Other | Admitting: Physical Medicine and Rehabilitation

## 2021-01-17 VITALS — BP 131/85 | HR 61

## 2021-01-17 DIAGNOSIS — M5416 Radiculopathy, lumbar region: Secondary | ICD-10-CM

## 2021-01-17 MED ORDER — DEXAMETHASONE SODIUM PHOSPHATE 10 MG/ML IJ SOLN
15.0000 mg | Freq: Once | INTRAMUSCULAR | Status: AC
  Administered 2021-01-17: 15 mg

## 2021-01-17 NOTE — Patient Instructions (Signed)

## 2021-01-17 NOTE — Progress Notes (Signed)
[name] - 65 y.o. female MRN [id number]  Date of birth: [birth year]  Office Visit Note: Visit Date: 01/17/2021 PCP: Orpah Cobb, MD Referred by: Orpah Cobb, MD  Subjective: Chief Complaint  Patient presents with  . Lower Back - Pain  . Left Leg - Pain   HPI:  [name] is a 65 y.o. female who comes in today at the request of Dr. Vira Browns for planned Left L2-L3 and L3-L4 Lumbar epidural steroid injection with fluoroscopic guidance.  The patient has failed conservative care including home exercise, medications, time and activity modification.  This injection will be diagnostic and hopefully therapeutic.  Please see requesting physician notes for further details and justification. MRI reviewed with images and spine model.  MRI reviewed in the note below.    ROS Otherwise per HPI.  Assessment & Plan: Visit Diagnoses:    ICD-10-CM   1. Lumbar radiculopathy  M54.16 XR C-ARM NO REPORT    Epidural Steroid injection    dexamethasone (DECADRON) injection 15 mg    Plan: No additional findings.   Meds & Orders:  Meds ordered this encounter  Medications  . dexamethasone (DECADRON) injection 15 mg    Orders Placed This Encounter  Procedures  . XR C-ARM NO REPORT  . Epidural Steroid injection    Follow-up: Return for Vira Browns, MD.   Procedures: No procedures performed  Lumbosacral Transforaminal Epidural Steroid Injection - Sub-Pedicular Approach with Fluoroscopic Guidance  Patient: [name]      Date of Birth: 10/18/1955 MRN: [id number] PCP: Orpah Cobb, MD      Visit Date: 01/17/2021   Universal Protocol:    Date/Time: 01/17/2021  Consent Given By: the patient  Position: PRONE  Additional Comments: Vital signs were monitored before and after the procedure. Patient was prepped and draped in the usual sterile fashion. The correct patient, procedure, and site was verified.   Injection Procedure Details:   Procedure diagnoses: Lumbar  radiculopathy [M54.16]    Meds Administered:  Meds ordered this encounter  Medications  . dexamethasone (DECADRON) injection 15 mg    Laterality: Left  Location/Site: Based on instrumented fusion at L4-5.  L2-L3 L3-L4  Needle:5.0 in., 22 ga.  Short bevel or Quincke spinal needle  Needle Placement: Transforaminal  Findings:    -Comments: Excellent flow of contrast along the nerve, nerve root and into the epidural space. Very stenotic flow.  Procedure Details: After squaring off the end-plates to get a true AP view, the C-arm was positioned so that an oblique view of the foramen as noted above was visualized. The target area is just inferior to the "nose of the scotty dog" or sub pedicular. The soft tissues overlying this structure were infiltrated with 2-3 ml. of 1% Lidocaine without Epinephrine.  The spinal needle was inserted toward the target using a "trajectory" view along the fluoroscope beam.  Under AP and lateral visualization, the needle was advanced so it did not puncture dura and was located close the 6 O'Clock position of the pedical in AP tracterory. Biplanar projections were used to confirm position. Aspiration was confirmed to be negative for CSF and/or blood. A 1-2 ml. volume of Isovue-250 was injected and flow of contrast was noted at each level. Radiographs were obtained for documentation purposes.   After attaining the desired flow of contrast documented above, a 0.5 to 1.0 ml test dose of 0.25% Marcaine was injected into each respective transforaminal space.  The patient was observed for 90 seconds  post injection.  After no sensory deficits were reported, and normal lower extremity motor function was noted,   the above injectate was administered so that equal amounts of the injectate were placed at each foramen (level) into the transforaminal epidural space.   Additional Comments:  No complications occurred , patient very intolerant of needle positioning and  anticipatory pain response, suggest pre-procedure oral sedation Dressing: 2 x 2 sterile gauze and Band-Aid    Post-procedure details: Patient was observed during the procedure. Post-procedure instructions were reviewed.  Patient left the clinic in stable condition.      Clinical History: MRI LUMBAR SPINE WITHOUT CONTRAST  TECHNIQUE: Multiplanar, multisequence MR imaging of the lumbar spine was performed. No intravenous contrast was administered.  COMPARISON:  Preoperative MRI 12/25/2017. Lumbar radiographs 12/06/2020.  FINDINGS: Segmentation: Normal on the comparison radiographs which is the same numbering system used on the 2019 MRI.  Alignment: Chronically exaggerated lumbar lordosis. Stable to decreased grade 1 anterolisthesis of L4 on L5 following interval surgery at that level, detailed below. Mild superimposed dextroconvex lumbar scoliosis. No other spondylolisthesis.  Vertebrae: Mild hardware susceptibility artifact at L4-L5. No marrow edema or evidence of acute osseous abnormality. Chronic degenerative posterior endplate marrow signal changes at L2-L3 eccentric to the left. Normal background bone marrow signal. Intact visible sacrum and SI joints.  Conus medullaris and cauda equina: Conus extends to the T12 level. No lower spinal cord or conus signal abnormality.  Paraspinal and other soft tissues: Stable visible abdominal viscera. Postoperative changes to the lower lumbar paraspinal soft tissues with no adverse features.  Disc levels:  Negative visible lower thoracic levels through T11-T12.  T12-L1: Mild disc space loss since 2019. Mild disc bulge. No stenosis.  L1-L2:  Stable minor disc bulging.  No stenosis.  L2-L3: Mildly increased chronic disc space loss. Left foraminal predominant disc osteophyte complex with superimposed mild to moderate facet hypertrophy on that side. Mild left lateral recess stenosis and moderate to severe left  neural foraminal stenosis appear increased (series 7, images 14 and 15).  L3-L4: Chronic posterior disc space loss with mostly far lateral disc osteophyte complex. Mild to moderate facet and ligament flavum hypertrophy. No spinal or convincing lateral recess stenosis. Mild to moderate bilateral L3 foraminal stenosis appears greater on the left but stable.  L4-L5: Interval decompression and fusion. Capacious thecal sac. No stenosis.  L5-S1: Chronic severe facet hypertrophy with evidence of developing right side facet ankylosis in 2019. Hardware susceptibility limits facet detail now. Disc here remains normal. No stenosis.  IMPRESSION: 1. Interval decompression and fusion at L4-L5 with no adverse features.  2. Chronic severe facet hypertrophy at the adjacent L5-S1 segment, but possible facet ankylosis since 2019. Normal disc at that level and no stenosis. Stable adjacent segment disease at L3-L4 with mild to moderate bilateral L3 foraminal stenosis greater on the left.  3. Progressed leftward and bulky disc, endplate, and facet degeneration at L2-L3 level with new mild left lateral recess stenosis and increased moderate to severe left neural foraminal stenosis. Query left L2 and/or L3 radiculitis.   Electronically Signed   By: Odessa Fleming M.D.   On: 12/24/2020 08:13     Objective:  VS:  HT:    WT:   BMI:     BP:131/85  HR:61bpm  TEMP: ( )  RESP:  Physical Exam Vitals and nursing note reviewed.  Constitutional:      General: She is not in acute distress.    Appearance: Normal appearance. She is not  ill-appearing.  HENT:     Head: Normocephalic and atraumatic.     Right Ear: External ear normal.     Left Ear: External ear normal.  Eyes:     Extraocular Movements: Extraocular movements intact.  Cardiovascular:     Rate and Rhythm: Normal rate.     Pulses: Normal pulses.  Pulmonary:     Effort: Pulmonary effort is normal. No respiratory distress.  Abdominal:      General: There is no distension.     Palpations: Abdomen is soft.  Musculoskeletal:        General: Tenderness present.     Cervical back: Neck supple.     Right lower leg: No edema.     Left lower leg: No edema.     Comments: Patient has good distal strength with no pain over the greater trochanters.  No clonus or focal weakness.  Skin:    Findings: No erythema, lesion or rash.  Neurological:     General: No focal deficit present.     Mental Status: She is alert and oriented to person, place, and time.     Sensory: No sensory deficit.     Motor: No weakness or abnormal muscle tone.     Coordination: Coordination normal.  Psychiatric:        Mood and Affect: Mood normal.        Behavior: Behavior normal.      Imaging: No results found.

## 2021-01-17 NOTE — Progress Notes (Signed)
Pt state lower back pain that travels to her left side then down her leg to the foot. Pt state walking for a long period of time and bending makes the pain worse. Pt state she takes pain meds to help ease her pain.  Numeric Pain Rating Scale and Functional Assessment Average Pain 8   In the last MONTH (on 0-10 scale) has pain interfered with the following?  1. General activity like being  able to carry out your everyday physical activities such as walking, climbing stairs, carrying groceries, or moving a chair?  Rating(10)   +Driver, -BT, -Dye Allergies.

## 2021-01-17 NOTE — Procedures (Signed)
Lumbosacral Transforaminal Epidural Steroid Injection - Sub-Pedicular Approach with Fluoroscopic Guidance  Patient: [name]      Date of Birth: March 31, 1956 MRN: [id number] PCP: Orpah Cobb, MD      Visit Date: 01/17/2021   Universal Protocol:    Date/Time: 01/17/2021  Consent Given By: the patient  Position: PRONE  Additional Comments: Vital signs were monitored before and after the procedure. Patient was prepped and draped in the usual sterile fashion. The correct patient, procedure, and site was verified.   Injection Procedure Details:   Procedure diagnoses: Lumbar radiculopathy [M54.16]    Meds Administered:  Meds ordered this encounter  Medications  . dexamethasone (DECADRON) injection 15 mg    Laterality: Left  Location/Site: Based on instrumented fusion at L4-5.  L2-L3 L3-L4  Needle:5.0 in., 22 ga.  Short bevel or Quincke spinal needle  Needle Placement: Transforaminal  Findings:    -Comments: Excellent flow of contrast along the nerve, nerve root and into the epidural space. Very stenotic flow.  Procedure Details: After squaring off the end-plates to get a true AP view, the C-arm was positioned so that an oblique view of the foramen as noted above was visualized. The target area is just inferior to the "nose of the scotty dog" or sub pedicular. The soft tissues overlying this structure were infiltrated with 2-3 ml. of 1% Lidocaine without Epinephrine.  The spinal needle was inserted toward the target using a "trajectory" view along the fluoroscope beam.  Under AP and lateral visualization, the needle was advanced so it did not puncture dura and was located close the 6 O'Clock position of the pedical in AP tracterory. Biplanar projections were used to confirm position. Aspiration was confirmed to be negative for CSF and/or blood. A 1-2 ml. volume of Isovue-250 was injected and flow of contrast was noted at each level. Radiographs were obtained for  documentation purposes.   After attaining the desired flow of contrast documented above, a 0.5 to 1.0 ml test dose of 0.25% Marcaine was injected into each respective transforaminal space.  The patient was observed for 90 seconds post injection.  After no sensory deficits were reported, and normal lower extremity motor function was noted,   the above injectate was administered so that equal amounts of the injectate were placed at each foramen (level) into the transforaminal epidural space.   Additional Comments:  No complications occurred , patient very intolerant of needle positioning and anticipatory pain response, suggest pre-procedure oral sedation Dressing: 2 x 2 sterile gauze and Band-Aid    Post-procedure details: Patient was observed during the procedure. Post-procedure instructions were reviewed.  Patient left the clinic in stable condition.

## 2021-01-19 ENCOUNTER — Ambulatory Visit: Payer: Medicare Other | Admitting: Specialist

## 2021-01-23 ENCOUNTER — Other Ambulatory Visit: Payer: Self-pay

## 2021-01-23 NOTE — Telephone Encounter (Signed)
Patient called she is requesting a rx refill for hydrocodone [phone]

## 2021-01-23 NOTE — Addendum Note (Signed)
Addended by: Penne Lash, Otis Dials on: 01/23/2021 08:53 AM   Modules accepted: Orders

## 2021-01-25 ENCOUNTER — Telehealth: Payer: Self-pay | Admitting: Specialist

## 2021-01-25 MED ORDER — HYDROCODONE-ACETAMINOPHEN 7.5-325 MG PO TABS: 1.0000 | ORAL_TABLET | Freq: Four times a day (QID) | ORAL | 0 refills | Status: DC | PRN

## 2021-01-25 NOTE — Telephone Encounter (Signed)
Please advise. Thanks.  

## 2021-01-25 NOTE — Telephone Encounter (Signed)
Pt called and was wondering if you have had a chance to sign off on her hydrocodone refill.

## 2021-01-25 NOTE — Telephone Encounter (Signed)
done

## 2021-02-16 ENCOUNTER — Other Ambulatory Visit: Payer: Self-pay | Admitting: Specialist

## 2021-02-16 ENCOUNTER — Ambulatory Visit: Payer: Medicare Other | Admitting: Specialist

## 2021-02-16 MED ORDER — HYDROCODONE-ACETAMINOPHEN 7.5-325 MG PO TABS: 1.0000 | ORAL_TABLET | Freq: Four times a day (QID) | ORAL | 0 refills | Status: DC | PRN

## 2021-02-16 NOTE — Telephone Encounter (Signed)
PT would like a refill on hydrocodone. She states she couldn't get a ride this morning and I gave her first available.

## 2021-03-12 ENCOUNTER — Encounter (HOSPITAL_COMMUNITY): Payer: Self-pay | Admitting: Emergency Medicine

## 2021-03-12 ENCOUNTER — Other Ambulatory Visit: Payer: Self-pay

## 2021-03-12 ENCOUNTER — Emergency Department (HOSPITAL_COMMUNITY)
Admission: EM | Admit: 2021-03-12 | Discharge: 2021-03-13 | Disposition: A | Discharge status: Home / Self Care | Payer: Medicare Other | Attending: Emergency Medicine | Admitting: Emergency Medicine

## 2021-03-12 DIAGNOSIS — Z7982 Long term (current) use of aspirin: Secondary | ICD-10-CM | POA: Diagnosis not present

## 2021-03-12 DIAGNOSIS — J449 Chronic obstructive pulmonary disease, unspecified: Secondary | ICD-10-CM | POA: Insufficient documentation

## 2021-03-12 DIAGNOSIS — I10 Essential (primary) hypertension: Secondary | ICD-10-CM | POA: Diagnosis not present

## 2021-03-12 DIAGNOSIS — Z955 Presence of coronary angioplasty implant and graft: Secondary | ICD-10-CM | POA: Insufficient documentation

## 2021-03-12 DIAGNOSIS — R252 Cramp and spasm: Secondary | ICD-10-CM | POA: Insufficient documentation

## 2021-03-12 DIAGNOSIS — R202 Paresthesia of skin: Secondary | ICD-10-CM | POA: Insufficient documentation

## 2021-03-12 DIAGNOSIS — Z87891 Personal history of nicotine dependence: Secondary | ICD-10-CM | POA: Insufficient documentation

## 2021-03-12 DIAGNOSIS — I2510 Atherosclerotic heart disease of native coronary artery without angina pectoris: Secondary | ICD-10-CM | POA: Diagnosis not present

## 2021-03-12 LAB — CBC WITH DIFFERENTIAL/PLATELET
Abs Immature Granulocytes: 0.03 10*3/uL (ref 0.00–0.07)
Basophils Absolute: 0 10*3/uL (ref 0.0–0.1)
Basophils Relative: 0 %
Eosinophils Absolute: 0 10*3/uL (ref 0.0–0.5)
Eosinophils Relative: 0 %
HCT: 44 % (ref 36.0–46.0)
Hemoglobin: 14.4 g/dL (ref 12.0–15.0)
Immature Granulocytes: 0 %
Lymphocytes Relative: 13 %
Lymphs Abs: 1.3 10*3/uL (ref 0.7–4.0)
MCH: 29.6 pg (ref 26.0–34.0)
MCHC: 32.7 g/dL (ref 30.0–36.0)
MCV: 90.3 fL (ref 80.0–100.0)
Monocytes Absolute: 0.4 10*3/uL (ref 0.1–1.0)
Monocytes Relative: 3 %
Neutro Abs: 8.8 10*3/uL — ABNORMAL HIGH (ref 1.7–7.7)
Neutrophils Relative %: 84 %
Platelets: 433 10*3/uL — ABNORMAL HIGH (ref 150–400)
RBC: 4.87 MIL/uL (ref 3.87–5.11)
RDW: 15.7 % — ABNORMAL HIGH (ref 11.5–15.5)
WBC: 10.6 10*3/uL — ABNORMAL HIGH (ref 4.0–10.5)
nRBC: 0 % (ref 0.0–0.2)

## 2021-03-12 MED ORDER — GABAPENTIN 100 MG PO CAPS
200.0000 mg | ORAL_CAPSULE | Freq: Every day | ORAL | Status: DC
  Administered 2021-03-12: 200 mg via ORAL
  Filled 2021-03-12: qty 2

## 2021-03-12 MED ORDER — HYDROMORPHONE HCL 1 MG/ML IJ SOLN
0.5000 mg | Freq: Once | INTRAMUSCULAR | Status: AC
  Administered 2021-03-12: 0.5 mg via INTRAVENOUS
  Filled 2021-03-12: qty 1

## 2021-03-12 MED ORDER — HYDROCODONE-ACETAMINOPHEN 7.5-325 MG PO TABS
1.0000 | ORAL_TABLET | Freq: Once | ORAL | Status: AC
  Administered 2021-03-12: 1 via ORAL
  Filled 2021-03-12: qty 1

## 2021-03-12 MED ORDER — SODIUM CHLORIDE 0.9 % IV BOLUS
500.0000 mL | Freq: Once | INTRAVENOUS | Status: AC
  Administered 2021-03-12: 500 mL via INTRAVENOUS

## 2021-03-12 NOTE — ED Triage Notes (Signed)
Pt bib [facility] from home for fibromyalgia flare, pt states this is the worst it has ever been. Pt ran out of her percocet's on Wednesday and doesn't have appointment until 6/6. Pt describes pain as pins and needles in her legs and arms

## 2021-03-12 NOTE — ED Provider Notes (Signed)
[facility] EMERGENCY DEPARTMENT Provider Note   CSN: [id number] Arrival date & time: 03/12/21  1728     History Chief Complaint  Patient presents with  . Leg Pain  . Arm Pain    [name] is a 65 y.o. female with past medical history of chronic hepatitis C, CAD, pseudoseizures, chronic back pain, who presents today for evaluation of pins-and-needles in her legs and arms. She states that she has fibromyalgia, is treated by Dr. Ferdie Ping for back pain, also takes Norco that he prescribes as needed.  She states that this is not her back pain in her back, chest, and abdomen do not hurt.  She states that she is having cramps in her bilateral legs and bilateral arms.  She notes that she is out of her Norco.  Her symptoms started at about 1 today.  She states this happened when she was sitting on the couch watching a movie.  She denies any trauma, no recent sickness or illness.  She got of fentanyl with EMS which she reports hasn't helped.   Level 5 caveat patient is anxious appearing, consistently moving on the bed, hyperventilating.  HPI     Past Medical History:  Diagnosis Date  . Acute hepatitis C without mention of hepatic coma(070.51)   . Anemia   . Arthritis    back  . Carpal tunnel syndrome   . Chest pain, unspecified   . Chronic airway obstruction, not elsewhere classified   . Chronic hepatitis C without hepatic coma (HCC) 02/09/2018  . Colitis   . Coronary atherosclerosis of unspecified type of vessel, native or graft   . Disorders of bursae and tendons in shoulder region, unspecified   . Drug-induced skin rash 04/07/2018  . GERD (gastroesophageal reflux disease)   . Hardware complicating wound infection (HCC) 02/09/2018  . Headache    migraines  . Hepatitis    hx of hepatitis C  . Infective otitis externa, unspecified   . Mixed hyperlipidemia   . MRSA infection 02/09/2018  . Nonspecific abnormal results of thyroid function study   . Other  bursitis disorders   . Other symptoms involving nervous and musculoskeletal systems(781.99)   . Pneumonia   . Seizures (HCC)    none for 4 years (as of 01/2018) "pseudo seizures"  . Thrush 04/07/2018    Patient Active Problem List   Diagnosis Date Noted  . Acute coronary syndrome (HCC) 11/25/2018  . Thrush 04/07/2018  . Drug-induced skin rash 04/07/2018  . Chronic hepatitis C without hepatic coma (HCC) 02/09/2018  . Hardware complicating wound infection (HCC) 02/09/2018  . MRSA infection 02/09/2018  . Anemia due to blood loss, acute 01/16/2018    Class: Acute  . Infection of lumbar spine (HCC) 01/16/2018    Class: Acute  . Spinal stenosis of lumbar region 01/13/2018    Class: Chronic  . Spondylolisthesis, lumbar region 01/13/2018    Class: Chronic  . Fusion of lumbar spine 01/13/2018  . Low back pain 12/23/2017  . Tobacco use disorder 06/24/2016  . Weight gain 01/02/2012  . Stool incontinence 11/19/2011  . Abdominal pain, right lower quadrant 11/19/2011  . EUSTACHIAN TUBE DYSFUNCTION 08/10/2009  . SINUSITIS, ACUTE 08/10/2009  . TRANSIENT GLOBAL AMNESIA 01/12/2009  . HIP PAIN, RIGHT 10/04/2008  . HEPATITIS C 07/28/2008  . COPD, 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  . HYPERLIPIDEMIA, MIXED, WITH LOW HDL 08/30/2007  . OTITIS EXTERNA, INFECTIVE NOS 08/27/2007  . ROTATOR CUFF SYNDROME, RIGHT 08/27/2007  . Chest pain  at rest 08/27/2007  . BURSITIS NOS 07/13/2007  . MUSCULOSKELETAL PAIN 07/13/2007  . CARPAL TUNNEL SYNDROME, BILATERAL 04/10/2007    Past Surgical History:  Procedure Laterality Date  . ABDOMINAL HYSTERECTOMY    . CARDIAC CATHETERIZATION    . CARDIAC CATHETERIZATION N/A 06/25/2016   Procedure: Left Heart Cath and Coronary Angiography;  Surgeon: Orpah CobbAjay [name], MD;  Location: [facility] INVASIVE CV LAB;  Service: Cardiovascular;  Laterality: N/A;  . CESAREAN SECTION    . CHOLECYSTECTOMY    . COLONOSCOPY    . IR FLUORO GUIDED NEEDLE PLC ASPIRATION/INJECTION LOC  12/27/2017  . LUMBAR FUSION   01/13/2018   Transforaminal lumbar interbody fusion L4-5 with screws, cages and rods, local bone graft, Allograft, Vivigen, Decompression L4-5 and L5-S1  . TONSILLECTOMY       OB History   No obstetric history on file.     Family History  Problem Relation Age of Onset  . Crohn's disease Daughter 7533       colon resection  . Irritable bowel syndrome Daughter   . Colonic polyp Mother   . Thyroid disease Mother   . COPD Mother   . Uterine cancer Maternal Grandmother   . Heart murmur Daughter        SVT  . Colon cancer Neg Hx     Social History   Tobacco Use  . Smoking status: Former Smoker    Types: Cigarettes    Quit date: 01/07/2018    Years since quitting: 3.1  . Smokeless tobacco: Never Used  . Tobacco comment: 2 cigarettes a day  Vaping Use  . Vaping Use: Every day  Substance Use Topics  . Alcohol use: No  . Drug use: No    Home Medications Prior to Admission medications   Medication Sig Start Date End Date Taking? Authorizing Provider  amLODipine (NORVASC) 2.5 MG tablet Take 1 tablet (2.5 mg total) by mouth daily. 11/26/18   Orpah CobbKadakia, Ajay, MD  aspirin EC 81 MG EC tablet Take 1 tablet (81 mg total) by mouth daily. 11/27/18   Orpah CobbKadakia, Ajay, MD  atorvastatin (LIPITOR) 40 MG tablet Take 1 tablet (40 mg total) by mouth daily at 6 PM. 11/26/18   Orpah CobbKadakia, Ajay, MD  gabapentin (NEURONTIN) 100 MG capsule Take 1 capsule (100 mg total) by mouth at bedtime. 12/27/20   Kerrin ChampagneNitka, James E, MD  HYDROcodone-acetaminophen (NORCO) 7.5-325 MG tablet Take 1 tablet by mouth every 6 (six) hours as needed for moderate pain. 02/16/21   Kerrin ChampagneNitka, James E, MD  metoprolol tartrate (LOPRESSOR) 25 MG tablet Take 0.5 tablets (12.5 mg total) by mouth 2 (two) times daily. 11/26/18   Orpah CobbKadakia, Ajay, MD  nitroGLYCERIN (NITROSTAT) 0.4 MG SL tablet Place 1 tablet (0.4 mg total) under the tongue every 5 (five) minutes x 3 doses as needed for chest pain. 11/26/18   Orpah CobbKadakia, Ajay, MD    Allergies     Acetaminophen-codeine, Morphine and related, and Azithromycin  Review of Systems   Review of Systems  Constitutional: Negative for chills and fever.  Respiratory: Negative for chest tightness and shortness of breath.   Cardiovascular: Negative for chest pain.  Gastrointestinal: Negative for abdominal pain.  Genitourinary: Negative for dysuria.       No changes to bowel or bladder function  Musculoskeletal: Negative for back pain and neck pain.  Neurological: Negative for weakness, numbness and headaches.       Parasthesias in bilateral arms and legs.   All other systems reviewed and are negative.  Physical Exam Updated Vital Signs BP 137/87   Pulse 80   Temp 98.1 F (36.7 C)   Resp 16   Ht 5\' 1"  (1.549 m)   Wt 72 kg   SpO2 97%   BMI 29.99 kg/m   Physical Exam Vitals and nursing note reviewed.  Constitutional:      Appearance: She is not diaphoretic.     Comments: Patient is moving around on the bed, appears uncomfortable.    HENT:     Head: Normocephalic and atraumatic.  Eyes:     General: No scleral icterus.       Right eye: No discharge.        Left eye: No discharge.     Conjunctiva/sclera: Conjunctivae normal.  Cardiovascular:     Rate and Rhythm: Normal rate and regular rhythm.     Pulses: Normal pulses.     Heart sounds: Normal heart sounds. No murmur heard.     Comments: 2+ DP/PT and radial pulses bilaterally.  All 4 extremities are warm and well perfused.  Pulmonary:     Effort: Pulmonary effort is normal. No respiratory distress.     Breath sounds: Normal breath sounds. No stridor.     Comments: Patient is taking rapid, deep breaths.  She is able to slow her breathing with coaching. Abdominal:     General: There is no distension.     Tenderness: There is no abdominal tenderness. There is no guarding or rebound.  Musculoskeletal:        General: No deformity.     Cervical back: Normal range of motion and neck supple.     Right lower leg: No edema.      Left lower leg: No edema.     Comments: Moves bilateral arms and legs with out difficulty.  5/5 strength bilaterally, facial movements are symmetric.   Skin:    General: Skin is warm and dry.     Capillary Refill: Capillary refill takes less than 2 seconds.  Neurological:     General: No focal deficit present.     Mental Status: She is alert.     Sensory: No sensory deficit (sensation intact to light touch to bilateral arms and legs).     Motor: No abnormal muscle tone.  Psychiatric:     Comments: Able to be coached to slow her breathing but once I stop coaching her she starts hyperventilating again.      ED Results / Procedures / Treatments   Labs (all labs ordered are listed, but only abnormal results are displayed) Labs Reviewed  CBC WITH DIFFERENTIAL/PLATELET - Abnormal; Notable for the following components:      Result Value   WBC 10.6 (*)    RDW 15.7 (*)    Platelets 433 (*)    Neutro Abs 8.8 (*)    All other components within normal limits  COMPREHENSIVE METABOLIC PANEL - Abnormal; Notable for the following components:   Potassium 3.4 (*)    CO2 21 (*)    Glucose, Bld 108 (*)    Calcium 8.5 (*)    All other components within normal limits  MAGNESIUM    EKG None  Radiology No results found.  Procedures Procedures   Medications Ordered in ED Medications  sodium chloride 0.9 % bolus 500 mL (0 mLs Intravenous Stopped 03/12/21 2051)  HYDROmorphone (DILAUDID) injection 0.5 mg (0.5 mg Intravenous Given 03/12/21 1933)  HYDROmorphone (DILAUDID) injection 0.5 mg (0.5 mg Intravenous Given 03/12/21 2033)  HYDROcodone-acetaminophen (NORCO)  7.5-325 MG per tablet 1 tablet (1 tablet Oral Given 03/12/21 2314)    ED Course  I have reviewed the triage vital signs and the nursing notes.  Pertinent labs & imaging results that were available during my care of the patient were reviewed by me and considered in my medical decision making (see chart for details).  Clinical  Course as of 03/14/21 0036  Mon Mar 12, 2021  2250 Patient is re-evaluated.  We discussed that I will not be giving her more dilaudid.  She declined rx stating she will call Dr. Otelia Sergeant in the morning.  [EH]    Clinical Course User Index [EH] Cristina Gong, PA-C   MDM Rules/Calculators/A&P                         [name] is a 65 year old woman who who presents today for evaluation of paresthesias worsening in bilateral arms and legs. She has been out of her gabapentin and narcotic pain meds. She does not have any pain in her chest, abdomen, pelvis or head.  No trauma.  She has intact pulses and is neurovascularly intact.  I doubt serious intrathoracic, intra-abdominal or intracranial cause of her symptoms.  She is neuro intact.  She is given dilaudid x2 in the er with out improvement.  She has ordered a dose of home pain meds and gabapentin.    Dr. Judd Lien will follow up on her CMP and magnesium anticipating discharge if they do not have significant abnormalities that would require treatment.   Note: Portions of this report may have been transcribed using voice recognition software. Every effort was made to ensure accuracy; however, inadvertent computerized transcription errors may be present   Final Clinical Impression(s) / ED Diagnoses Final diagnoses:  Paresthesia    Rx / DC Orders ED Discharge Orders    None       Cristina Gong, PA-C 03/14/21 0036    Tegeler, Canary Brim, MD 03/15/21 1101

## 2021-03-12 NOTE — Discharge Instructions (Addendum)
Please call your doctor in the morning to arrange more pain medications.   Today you received medications that may make you sleepy or impair your ability to make decisions.  For the next 24 hours please do not drive, operate heavy machinery, care for a small child with out another adult present, or perform any activities that may cause harm to you or someone else if you were to fall asleep or be impaired.

## 2021-03-13 DIAGNOSIS — R531 Weakness: Secondary | ICD-10-CM | POA: Diagnosis not present

## 2021-03-13 DIAGNOSIS — R5381 Other malaise: Secondary | ICD-10-CM | POA: Diagnosis not present

## 2021-03-13 LAB — COMPREHENSIVE METABOLIC PANEL
ALT: 22 U/L (ref 0–44)
AST: 27 U/L (ref 15–41)
Albumin: 3.7 g/dL (ref 3.5–5.0)
Alkaline Phosphatase: 76 U/L (ref 38–126)
Anion gap: 11 (ref 5–15)
BUN: 8 mg/dL (ref 8–23)
CO2: 21 mmol/L — ABNORMAL LOW (ref 22–32)
Calcium: 8.5 mg/dL — ABNORMAL LOW (ref 8.9–10.3)
Chloride: 103 mmol/L (ref 98–111)
Creatinine, Ser: 0.65 mg/dL (ref 0.44–1.00)
GFR, Estimated: >60 mL/min (ref >60)
Glucose, Bld: 108 mg/dL — ABNORMAL HIGH (ref 70–99)
Potassium: 3.4 mmol/L — ABNORMAL LOW (ref 3.5–5.1)
Sodium: 135 mmol/L (ref 135–145)
Total Bilirubin: 0.7 mg/dL (ref 0.3–1.2)
Total Protein: 6.8 g/dL (ref 6.5–8.1)

## 2021-03-13 LAB — MAGNESIUM: Magnesium: 2.1 mg/dL (ref 1.7–2.4)

## 2021-03-13 NOTE — ED Notes (Signed)
[facility] called  

## 2021-03-19 ENCOUNTER — Other Ambulatory Visit: Payer: Self-pay

## 2021-03-19 ENCOUNTER — Encounter: Payer: Self-pay | Admitting: Specialist

## 2021-03-19 ENCOUNTER — Ambulatory Visit (INDEPENDENT_AMBULATORY_CARE_PROVIDER_SITE_OTHER): Payer: Medicare Other | Admitting: Specialist

## 2021-03-19 VITALS — BP 174/91 | HR 80 | Ht 61.0 in | Wt 159.0 lb

## 2021-03-19 DIAGNOSIS — M4316 Spondylolisthesis, lumbar region: Secondary | ICD-10-CM | POA: Diagnosis not present

## 2021-03-19 DIAGNOSIS — M4726 Other spondylosis with radiculopathy, lumbar region: Secondary | ICD-10-CM | POA: Diagnosis not present

## 2021-03-19 DIAGNOSIS — M4326 Fusion of spine, lumbar region: Secondary | ICD-10-CM

## 2021-03-19 DIAGNOSIS — L608 Other nail disorders: Secondary | ICD-10-CM | POA: Diagnosis not present

## 2021-03-19 DIAGNOSIS — M4155 Other secondary scoliosis, thoracolumbar region: Secondary | ICD-10-CM

## 2021-03-19 MED ORDER — METHYLPREDNISOLONE 4 MG PO TBPK: ORAL_TABLET | ORAL | 0 refills | Status: DC

## 2021-03-19 MED ORDER — HYDROCODONE-ACETAMINOPHEN 7.5-325 MG PO TABS: 1.0000 | ORAL_TABLET | Freq: Four times a day (QID) | ORAL | 0 refills | Status: DC | PRN

## 2021-03-19 MED ORDER — GABAPENTIN 100 MG PO CAPS: 100.0000 mg | ORAL_CAPSULE | Freq: Every day | ORAL | 1 refills | Status: DC

## 2021-03-19 NOTE — Progress Notes (Addendum)
Office Visit Note   Patient: [name]           Date of Birth: [birth year]           MRN: [id number] Visit Date: 03/19/2021              Requested by: Orpah Cobb, MD [address] [place],  [place] [address] PCP: Orpah Cobb, MD   Assessment & Plan: Visit Diagnoses:  1. Longitudinal erythronychia   2. Other spondylosis with radiculopathy, lumbar region   3. Spondylolisthesis, lumbar region   4. Fusion of lumbar spine   5. Other secondary scoliosis, thoracolumbar region     Plan: Avoid bending, stooping and avoid lifting weights greater than 10 lbs. Avoid prolong standing and walking. Avoid frequent bending and stooping  No lifting greater than 10 lbs. May use ice or moist heat for pain. Weight loss is of benefit. Handicap license is approved. Dr. Benzonia Blas secretary/Assistant will call to arrange for epidural steroid injection  Call an see a podiatrist. Medrol dose pak for inflamation of the right great toe epynychium.  Follow-Up Instructions: No follow-ups on file.   Orders:  Orders Placed This Encounter  Procedures   Ambulatory referral to Physical Medicine Rehab   Meds ordered this encounter  Medications   DISCONTD: methylPREDNISolone (MEDROL DOSEPAK) 4 MG TBPK tablet    Sig: Take as directed 6 day dose pak    Dispense:  21 tablet    Refill:  0   methylPREDNISolone (MEDROL DOSEPAK) 4 MG TBPK tablet    Sig: Take as directed 6 day dose pak    Dispense:  21 tablet    Refill:  0   gabapentin (NEURONTIN) 100 MG capsule    Sig: Take 1 capsule (100 mg total) by mouth at bedtime.    Dispense:  30 capsule    Refill:  1   DISCONTD: HYDROcodone-acetaminophen (NORCO) 7.5-325 MG tablet    Sig: Take 1 tablet by mouth every 6 (six) hours as needed for moderate pain.    Dispense:  30 tablet    Refill:  0      Procedures: No procedures performed   Clinical Data: No additional findings.   Subjective: Chief Complaint  Patient presents with   Lower  Back - Follow-up    Lt L2-3, L3-4 TF injection 01/17/21, states that she is still hurting but she did get some relief for the first week.   Right Foot - Pain    Great toe is red at the base of the nail. X 2 weeks,     65 year old female with right great toe nail base redness and pain. She has had injections of the spine with some relief of up to 70% persent of her pain and relates that the injections lasted about 2-3 weeks.  Then presented to the ER with acute pain and numbness and paresthesias both arms and legs.     Review of Systems  Constitutional: Negative.   HENT: Negative.    Eyes: Negative.   Respiratory: Negative.    Cardiovascular: Negative.   Gastrointestinal: Negative.   Endocrine: Negative.   Genitourinary: Negative.   Musculoskeletal: Negative.   Skin: Negative.   Allergic/Immunologic: Negative.   Neurological: Negative.   Hematological: Negative.   Psychiatric/Behavioral: Negative.      Objective: Vital Signs: BP (!) 174/91 (BP Location: Left Arm, Patient Position: Sitting)   Pulse 80   Ht 5\' 1"  (1.549 m)   Wt 159 lb (  72.1 kg)   BMI 30.04 kg/m   Physical Exam Constitutional:      Appearance: She is well-developed.  HENT:     Head: Normocephalic and atraumatic.  Eyes:     Pupils: Pupils are equal, round, and reactive to light.  Pulmonary:     Effort: Pulmonary effort is normal.     Breath sounds: Normal breath sounds.  Abdominal:     General: Bowel sounds are normal.     Palpations: Abdomen is soft.  Musculoskeletal:        General: Normal range of motion.     Cervical back: Normal range of motion and neck supple.  Skin:    General: Skin is warm and dry.  Neurological:     Mental Status: She is alert and oriented to person, place, and time.  Psychiatric:        Behavior: Behavior normal.        Thought Content: Thought content normal.        Judgment: Judgment normal.    Ortho Exam  Specialty Comments:  No specialty comments  available.  Imaging: No results found.   PMFS History: Patient Active Problem List   Diagnosis Date Noted   Anemia due to blood loss, acute 01/16/2018    Priority: 1.    Class: Acute   Infection of lumbar spine (HCC) 01/16/2018    Priority: 1.    Class: Acute   Spinal stenosis of lumbar region 01/13/2018    Priority: 1.    Class: Chronic   Spondylolisthesis, lumbar region 01/13/2018    Priority: 1.    Class: Chronic   Acute coronary syndrome (HCC) 11/25/2018   Thrush 04/07/2018   Drug-induced skin rash 04/07/2018   Chronic hepatitis C without hepatic coma (HCC) 02/09/2018   Hardware complicating wound infection (HCC) 02/09/2018   MRSA infection 02/09/2018   Fusion of lumbar spine 01/13/2018   Low back pain 12/23/2017   Tobacco use disorder 06/24/2016   Weight gain 01/02/2012   Stool incontinence 11/19/2011   Abdominal pain, right lower quadrant 11/19/2011   EUSTACHIAN TUBE DYSFUNCTION 08/10/2009   SINUSITIS, ACUTE 08/10/2009   TRANSIENT GLOBAL AMNESIA 01/12/2009   HIP PAIN, RIGHT 10/04/2008   HEPATITIS C 07/28/2008   COPD, 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   HYPERLIPIDEMIA, MIXED, WITH LOW HDL 08/30/2007   OTITIS EXTERNA, INFECTIVE NOS 08/27/2007   ROTATOR CUFF SYNDROME, RIGHT 08/27/2007   Chest pain at rest 08/27/2007   BURSITIS NOS 07/13/2007   MUSCULOSKELETAL PAIN 07/13/2007   CARPAL TUNNEL SYNDROME, BILATERAL 04/10/2007   Past Medical History:  Diagnosis Date   Acute hepatitis C without mention of hepatic coma(070.51)    Anemia    Arthritis    back   Carpal tunnel syndrome    Chest pain, unspecified    Chronic airway obstruction, not elsewhere classified    Chronic hepatitis C without hepatic coma (HCC) 02/09/2018   Colitis    Coronary atherosclerosis of unspecified type of vessel, native or graft    Disorders of bursae and tendons in shoulder region, unspecified    Drug-induced skin rash 04/07/2018   GERD (gastroesophageal reflux disease)    Hardware complicating wound  infection (HCC) 02/09/2018   Headache    migraines   Hepatitis    hx of hepatitis C   Infective otitis externa, unspecified    Mixed hyperlipidemia    MRSA infection 02/09/2018   Nonspecific abnormal results of thyroid function study    Other bursitis disorders    Other symptoms  involving nervous and musculoskeletal systems(781.99)    Pneumonia    Seizures (HCC)    none for 4 years (as of 01/2018) "pseudo seizures"   Thrush 04/07/2018    Family History  Problem Relation Age of Onset   Crohn's disease Daughter 88       colon resection   Irritable bowel syndrome Daughter    Colonic polyp Mother    Thyroid disease Mother    COPD Mother    Uterine cancer Maternal Grandmother    Heart murmur Daughter        SVT   Colon cancer Neg Hx     Past Surgical History:  Procedure Laterality Date   ABDOMINAL HYSTERECTOMY     CARDIAC CATHETERIZATION     CARDIAC CATHETERIZATION N/A 06/25/2016   Procedure: Left Heart Cath and Coronary Angiography;  Surgeon: Orpah Cobb, MD;  Location: [facility] IN[facility]AS[facility] LAB;  Service: Cardiovascular;  Laterality: N/A;   CESAREAN SECTION     CHOLECYSTECTOMY     COLONOSCOPY     IR FLUORO GUIDED NEEDLE PLC ASPIRATION/INJECTION LOC  12/27/2017   LUMBAR FUSION  01/13/2018   Transforaminal lumbar interbody fusion L4-5 with screws, cages and rods, local bone graft, Allograft, Vivigen, Decompression L4-5 and L5-S1   TONSILLECTOMY     Social History   Occupational History   Occupation: care giver  Tobacco Use   Smoking status: Former    Types: Cigarettes    Quit date: 01/07/2018    Years since quitting: 3.5   Smokeless tobacco: Never   Tobacco comments:    2 cigarettes a day  Vaping Use   Vaping Use: Every day  Substance and Sexual Activity   Alcohol use: No   Drug use: No   Sexual activity: Yes    Partners: Male

## 2021-04-03 ENCOUNTER — Ambulatory Visit: Payer: Medicare Other | Admitting: Physical Medicine and Rehabilitation

## 2021-06-06 ENCOUNTER — Other Ambulatory Visit: Payer: Self-pay | Admitting: Specialist

## 2021-06-06 NOTE — Telephone Encounter (Signed)
Pt calling asking for a refill on her medication of hydrocodone to help with pain. Pt said she has not had any in a while but pain is so severe she called to get a refill. The best pharmacy on file and the best call back number of [phone].

## 2021-06-07 NOTE — Telephone Encounter (Signed)
pending with Dr Otelia Sergeant

## 2021-06-08 ENCOUNTER — Other Ambulatory Visit: Payer: Self-pay | Admitting: Specialist

## 2021-06-08 MED ORDER — HYDROCODONE-ACETAMINOPHEN 7.5-325 MG PO TABS: 1.0000 | ORAL_TABLET | Freq: Two times a day (BID) | ORAL | 0 refills | Status: DC

## 2021-07-05 ENCOUNTER — Telehealth: Payer: Self-pay | Admitting: Specialist

## 2021-07-05 NOTE — Telephone Encounter (Signed)
Pt called asking for a refill of her norco 7.5 mg rx and she would like to be notified when this is done.   [phone]

## 2021-07-12 ENCOUNTER — Other Ambulatory Visit: Payer: Self-pay | Admitting: Specialist

## 2021-07-12 MED ORDER — HYDROCODONE-ACETAMINOPHEN 7.5-325 MG PO TABS: 1.0000 | ORAL_TABLET | Freq: Two times a day (BID) | ORAL | 0 refills | Status: DC

## 2021-07-12 NOTE — Telephone Encounter (Signed)
Please advise last message. Pt called again about refill

## 2021-07-18 ENCOUNTER — Other Ambulatory Visit: Payer: Self-pay | Admitting: Specialist

## 2021-07-27 ENCOUNTER — Other Ambulatory Visit: Payer: Self-pay

## 2021-07-27 ENCOUNTER — Encounter: Payer: Self-pay | Admitting: Specialist

## 2021-07-27 ENCOUNTER — Ambulatory Visit (INDEPENDENT_AMBULATORY_CARE_PROVIDER_SITE_OTHER): Payer: Medicare Other | Admitting: Specialist

## 2021-07-27 VITALS — BP 144/81 | HR 61 | Ht 61.0 in | Wt 159.0 lb

## 2021-07-27 DIAGNOSIS — M4726 Other spondylosis with radiculopathy, lumbar region: Secondary | ICD-10-CM | POA: Diagnosis not present

## 2021-07-27 DIAGNOSIS — M4326 Fusion of spine, lumbar region: Secondary | ICD-10-CM | POA: Diagnosis not present

## 2021-07-27 DIAGNOSIS — M4155 Other secondary scoliosis, thoracolumbar region: Secondary | ICD-10-CM | POA: Diagnosis not present

## 2021-07-27 DIAGNOSIS — M4316 Spondylolisthesis, lumbar region: Secondary | ICD-10-CM

## 2021-07-27 DIAGNOSIS — M5416 Radiculopathy, lumbar region: Secondary | ICD-10-CM

## 2021-07-27 MED ORDER — HYDROCODONE-ACETAMINOPHEN 10-325 MG PO TABS: 1.0000 | ORAL_TABLET | Freq: Four times a day (QID) | ORAL | 0 refills | Status: DC | PRN

## 2021-07-27 NOTE — Progress Notes (Signed)
Office Visit Note   Patient: [name]           Date of Birth: 05-29-1956           MRN: [id number] Visit Date: 07/27/2021              Requested by: Orpah Cobb, MD [address] [place],  [place] [address] PCP: Orpah Cobb, MD   Assessment & Plan: Visit Diagnoses:  1. Spondylolisthesis, lumbar region   2. Fusion of lumbar spine   3. Other spondylosis with radiculopathy, lumbar region   4. Other secondary scoliosis, thoracolumbar region   5. Lumbar radiculopathy     Plan: Avoid bending, stooping and avoid lifting weights greater than 10 lbs. Avoid prolong standing and walking. Avoid frequent bending and stooping  No lifting greater than 10 lbs. May use ice or moist heat for pain. Weight loss is of benefit. Handicap license is approved. MRI of the thoracic spine preop evaluation for extension of her fusion to T9-10. She has severe foramenal narrowing of the left L2 and L3 neuroforamen above the L4-5 fusion within the concave portion of a thoracolumbar scoliosis. Foramenotomy alone will only temporarily relieve her pain and likely this would recur due to further collapse of the scoliosis curve the scoliosis curve will need to be  Fused to prevent recurrent pain and collapse with recurrent narrowing of the left neuroforamen.   Follow-Up Instructions: No follow-ups on file.   Orders:  No orders of the defined types were placed in this encounter.  No orders of the defined types were placed in this encounter.     Procedures: No procedures performed   Clinical Data: No additional findings.   Subjective: Chief Complaint  Patient presents with   Lower Back - Pain    65 year old female with left anterior thigh pain worse with standing and walking. Seen last in 03/2021 and had ESI done but these did not help in the past with ESI done 01/2021. Her pain is worsening and she has been calling for more narcotic medication and is requesting something be done to  decrease the left thigh pain. The pain is now present standing, sitting and bending. MRI from 12/2020 shows left L2 and L3 foramenal stenosis in the inside curve of a thoracolumbar scolios with collapsing degenerative disc changes.   Review of Systems  Constitutional: Negative.   HENT: Negative.    Eyes: Negative.   Respiratory: Negative.    Cardiovascular: Negative.   Gastrointestinal: Negative.   Endocrine: Negative.   Genitourinary: Negative.   Musculoskeletal: Negative.   Skin: Negative.   Allergic/Immunologic: Negative.   Neurological: Negative.   Hematological: Negative.   Psychiatric/Behavioral: Negative.      Objective: Vital Signs: BP (!) 144/81 (BP Location: Left Arm, Patient Position: Sitting)   Pulse 61   Ht 5\' 1"  (1.549 m)   Wt 159 lb (72.1 kg)   BMI 30.04 kg/m   Physical Exam Constitutional:      Appearance: She is well-developed.  HENT:     Head: Normocephalic and atraumatic.  Eyes:     Pupils: Pupils are equal, round, and reactive to light.  Pulmonary:     Effort: Pulmonary effort is normal.     Breath sounds: Normal breath sounds.  Abdominal:     General: Bowel sounds are normal.     Palpations: Abdomen is soft.  Musculoskeletal:     Cervical back: Normal range of motion and neck supple.  Lumbar back: Negative right straight leg raise test and negative left straight leg raise test.  Skin:    General: Skin is warm and dry.  Neurological:     Mental Status: She is alert and oriented to person, place, and time.  Psychiatric:        Behavior: Behavior normal.        Thought Content: Thought content normal.        Judgment: Judgment normal.   Back Exam   Tenderness  The patient is experiencing tenderness in the lumbar.  Range of Motion  Extension:  abnormal  Flexion:  abnormal  Lateral bend right:  abnormal  Lateral bend left:  abnormal  Rotation right:  abnormal  Rotation left:  abnormal   Muscle Strength  Right Quadriceps:  5/5  Left  Quadriceps:  5/5  Right Hamstrings:  5/5  Left Hamstrings:  5/5   Tests  Straight leg raise right: negative Straight leg raise left: negative  Reflexes  Patellar:  0/4 Achilles:  0/4  Other  Toe walk: normal Heel walk: normal Gait: normal  Erythema: no back redness Scars: absent  Comments:  Left hip flexion weakness 4-/5    Specialty Comments:  No specialty comments available.  Imaging: No results found.   PMFS History: Patient Active Problem List   Diagnosis Date Noted   Anemia due to blood loss, acute 01/16/2018    Priority: 1.    Class: Acute   Infection of lumbar spine (HCC) 01/16/2018    Priority: 1.    Class: Acute   Spinal stenosis of lumbar region 01/13/2018    Priority: 1.    Class: Chronic   Spondylolisthesis, lumbar region 01/13/2018    Priority: 1.    Class: Chronic   Acute coronary syndrome (HCC) 11/25/2018   Thrush 04/07/2018   Drug-induced skin rash 04/07/2018   Chronic hepatitis C without hepatic coma (HCC) 02/09/2018   Hardware complicating wound infection (HCC) 02/09/2018   MRSA infection 02/09/2018   Fusion of lumbar spine 01/13/2018   Low back pain 12/23/2017   Tobacco use disorder 06/24/2016   Weight gain 01/02/2012   Stool incontinence 11/19/2011   Abdominal pain, right lower quadrant 11/19/2011   EUSTACHIAN TUBE DYSFUNCTION 08/10/2009   SINUSITIS, ACUTE 08/10/2009   TRANSIENT GLOBAL AMNESIA 01/12/2009   HIP PAIN, RIGHT 10/04/2008   HEPATITIS C 07/28/2008   COPD, 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   HYPERLIPIDEMIA, MIXED, WITH LOW HDL 08/30/2007   OTITIS EXTERNA, INFECTIVE NOS 08/27/2007   ROTATOR CUFF SYNDROME, RIGHT 08/27/2007   Chest pain at rest 08/27/2007   BURSITIS NOS 07/13/2007   MUSCULOSKELETAL PAIN 07/13/2007   CARPAL TUNNEL SYNDROME, BILATERAL 04/10/2007   Past Medical History:  Diagnosis Date   Acute hepatitis C without mention of hepatic coma(070.51)    Anemia    Arthritis    back   Carpal tunnel syndrome    Chest pain,  unspecified    Chronic airway obstruction, not elsewhere classified    Chronic hepatitis C without hepatic coma (HCC) 02/09/2018   Colitis    Coronary atherosclerosis of unspecified type of vessel, native or graft    Disorders of bursae and tendons in shoulder region, unspecified    Drug-induced skin rash 04/07/2018   GERD (gastroesophageal reflux disease)    Hardware complicating wound infection (HCC) 02/09/2018   Headache    migraines   Hepatitis    hx of hepatitis C   Infective otitis externa, unspecified    Mixed hyperlipidemia  MRSA infection 02/09/2018   Nonspecific abnormal results of thyroid function study    Other bursitis disorders    Other symptoms involving nervous and musculoskeletal systems(781.99)    Pneumonia    Seizures (HCC)    none for 4 years (as of 01/2018) "pseudo seizures"   Thrush 04/07/2018    Family History  Problem Relation Age of Onset   Crohn's disease Daughter 54       colon resection   Irritable bowel syndrome Daughter    Colonic polyp Mother    Thyroid disease Mother    COPD Mother    Uterine cancer Maternal Grandmother    Heart murmur Daughter        SVT   Colon cancer Neg Hx     Past Surgical History:  Procedure Laterality Date   ABDOMINAL HYSTERECTOMY     CARDIAC CATHETERIZATION     CARDIAC CATHETERIZATION N/A 06/25/2016   Procedure: Left Heart Cath and Coronary Angiography;  Surgeon: Orpah Cobb, MD;  Location: [facility];  Service: Cardiovascular;  Laterality: N/A;   CESAREAN SECTION     CHOLECYSTECTOMY     COLONOSCOPY     IR FLUORO GUIDED NEEDLE PLC ASPIRATION/INJECTION LOC  12/27/2017   LUMBAR FUSION  01/13/2018   Transforaminal lumbar interbody fusion L4-5 with screws, cages and rods, local bone graft, Allograft, Vivigen, Decompression L4-5 and L5-S1   TONSILLECTOMY     Social History   Occupational History   Occupation: care giver  Tobacco Use   Smoking status: Former    Types: Cigarettes    Quit date: 01/07/2018     Years since quitting: 3.5   Smokeless tobacco: Never   Tobacco comments:    2 cigarettes a day  Vaping Use   Vaping Use: Every day  Substance and Sexual Activity   Alcohol use: No   Drug use: No   Sexual activity: Yes    Partners: Male

## 2021-07-27 NOTE — Patient Instructions (Signed)
Plan: Avoid bending, stooping and avoid lifting weights greater than 10 lbs. Avoid prolong standing and walking. Avoid frequent bending and stooping  No lifting greater than 10 lbs. May use ice or moist heat for pain. Weight loss is of benefit. Handicap license is approved. MRI of the thoracic spine preop evaluation for extension of her fusion to T9-10. She has severe foramenal narrowing of the left L2 and L3 neuroforamen above the L4-5 fusion within the concave portion of a thoracolumbar scoliosis. Foramenotomy alone will only temporarily relieve her pain and likely this would recur due to further collapse of the scoliosis curve the scoliosis curve will need to be  Fused to prevent recurrent pain and collapse with recurrent narrowing of the left neuroforamen.

## 2021-08-07 ENCOUNTER — Other Ambulatory Visit: Payer: Self-pay | Admitting: Specialist

## 2021-08-07 MED ORDER — HYDROCODONE-ACETAMINOPHEN 10-325 MG PO TABS: 1.0000 | ORAL_TABLET | Freq: Four times a day (QID) | ORAL | 0 refills | Status: DC | PRN

## 2021-08-07 NOTE — Telephone Encounter (Signed)
Pt called stating she is close to running out of her pain rx; and would like to have a refill sent in so that she doesn't run out on the weekend and have to be without it.

## 2021-08-15 ENCOUNTER — Ambulatory Visit
Admission: RE | Admit: 2021-08-15 | Discharge: 2021-08-15 | Disposition: A | Discharge status: Home / Self Care | Payer: Medicare Other | Source: Ambulatory Visit | Attending: Specialist | Admitting: Specialist

## 2021-08-15 ENCOUNTER — Other Ambulatory Visit: Payer: Self-pay

## 2021-08-15 DIAGNOSIS — M5125 Other intervertebral disc displacement, thoracolumbar region: Secondary | ICD-10-CM | POA: Diagnosis not present

## 2021-08-15 DIAGNOSIS — M4726 Other spondylosis with radiculopathy, lumbar region: Secondary | ICD-10-CM

## 2021-08-15 DIAGNOSIS — Z01818 Encounter for other preprocedural examination: Secondary | ICD-10-CM | POA: Diagnosis not present

## 2021-08-15 DIAGNOSIS — M4316 Spondylolisthesis, lumbar region: Secondary | ICD-10-CM

## 2021-08-15 DIAGNOSIS — M4319 Spondylolisthesis, multiple sites in spine: Secondary | ICD-10-CM | POA: Diagnosis not present

## 2021-08-15 DIAGNOSIS — M5124 Other intervertebral disc displacement, thoracic region: Secondary | ICD-10-CM | POA: Diagnosis not present

## 2021-08-15 DIAGNOSIS — M5416 Radiculopathy, lumbar region: Secondary | ICD-10-CM

## 2021-08-15 DIAGNOSIS — M4326 Fusion of spine, lumbar region: Secondary | ICD-10-CM

## 2021-08-15 DIAGNOSIS — M4155 Other secondary scoliosis, thoracolumbar region: Secondary | ICD-10-CM

## 2021-08-20 ENCOUNTER — Other Ambulatory Visit: Payer: Self-pay | Admitting: Specialist

## 2021-08-20 MED ORDER — HYDROCODONE-ACETAMINOPHEN 10-325 MG PO TABS: 1.0000 | ORAL_TABLET | Freq: Four times a day (QID) | ORAL | 0 refills | Status: DC | PRN

## 2021-08-20 NOTE — Telephone Encounter (Signed)
Pt called requesting a refill of hydrocodone. Please send to pharmacy on file. Please call pt when medication has been sent in at [phone].

## 2021-08-31 ENCOUNTER — Other Ambulatory Visit: Payer: Self-pay | Admitting: Specialist

## 2021-08-31 MED ORDER — HYDROCODONE-ACETAMINOPHEN 10-325 MG PO TABS: 1.0000 | ORAL_TABLET | Freq: Four times a day (QID) | ORAL | 0 refills | Status: DC | PRN

## 2021-08-31 NOTE — Telephone Encounter (Signed)
Patient called. She would like hydrocodone called in. Her call back number is [phone]

## 2021-08-31 NOTE — Telephone Encounter (Signed)
I called patient and advised. 

## 2021-09-10 ENCOUNTER — Other Ambulatory Visit: Payer: Self-pay | Admitting: Specialist

## 2021-09-10 MED ORDER — HYDROCODONE-ACETAMINOPHEN 10-325 MG PO TABS: 1.0000 | ORAL_TABLET | Freq: Four times a day (QID) | ORAL | 0 refills | Status: DC | PRN

## 2021-09-10 NOTE — Telephone Encounter (Signed)
Pt called requesting a refill of hydrocodone. Please send to pharmacy on file. Pt phone number is [phone].

## 2021-09-12 ENCOUNTER — Encounter: Payer: Self-pay | Admitting: Specialist

## 2021-09-12 ENCOUNTER — Ambulatory Visit (INDEPENDENT_AMBULATORY_CARE_PROVIDER_SITE_OTHER): Payer: Medicare Other | Admitting: Specialist

## 2021-09-12 VITALS — BP 161/82 | HR 51 | Ht 61.0 in | Wt 159.0 lb

## 2021-09-12 DIAGNOSIS — M48062 Spinal stenosis, lumbar region with neurogenic claudication: Secondary | ICD-10-CM

## 2021-09-12 DIAGNOSIS — M4155 Other secondary scoliosis, thoracolumbar region: Secondary | ICD-10-CM | POA: Diagnosis not present

## 2021-09-12 DIAGNOSIS — M4316 Spondylolisthesis, lumbar region: Secondary | ICD-10-CM | POA: Diagnosis not present

## 2021-09-12 DIAGNOSIS — M4326 Fusion of spine, lumbar region: Secondary | ICD-10-CM

## 2021-09-12 DIAGNOSIS — M4726 Other spondylosis with radiculopathy, lumbar region: Secondary | ICD-10-CM

## 2021-09-12 NOTE — Patient Instructions (Signed)
Plan: Avoid bending, stooping and avoid lifting weights greater than 10 lbs. Avoid prolong standing and walking. Order for a new walker with wheels. Surgery scheduling secretary Tivis Ringer, will call you in the next week to schedule for surgery.  Surgery recommended is a two level lumbar fusion  left L2-3 and L3-4 this would be done with rods, screws and cages with local bone graft and allograft (donor bone graft). Take hydrocodone for for pain. Risk of surgery includes risk of infection 1 in 200 patients, bleeding 1/2% chance you would need a transfusion.   Risk to the nerves is one in 10,000. You will need to use a brace for 3 months and wean from the brace on the 4th month. Expect improved walking and standing tolerance. Expect relief of leg pain but numbness may persist depending on the length and degree of pressure that has been present.

## 2021-09-12 NOTE — Progress Notes (Signed)
Office Visit Note   Patient: [name]           Date of Birth: 12-04-1955           MRN: [id number] Visit Date: 09/12/2021              Requested by: Orpah Cobb, MD [address] [place],  [place] [address] PCP: Orpah Cobb, MD   Assessment & Plan: Visit Diagnoses:  1. Spondylolisthesis, lumbar region   2. Fusion of lumbar spine   3. Other secondary scoliosis, thoracolumbar region   4. Other spondylosis with radiculopathy, lumbar region     Plan: Avoid bending, stooping and avoid lifting weights greater than 10 lbs. Avoid prolong standing and walking. Order for a new walker with wheels. Surgery scheduling secretary Tivis Ringer, will call you in the next week to schedule for surgery.  Surgery recommended is a two level lumbar fusion  left L2-3 and L3-4 this would be done with rods, screws and cages with local bone graft and allograft (donor bone graft). Take hydrocodone for for pain. Risk of surgery includes risk of infection 1 in 200 patients, bleeding 1/2% chance you would need a transfusion.   Risk to the nerves is one in 10,000. You will need to use a brace for 3 months and wean from the brace on the 4th month. Expect improved walking and standing tolerance. Expect relief of leg pain but numbness may persist depending on the length and degree of pressure that has been present.    Follow-Up Instructions: No follow-ups on file.   Orders:  No orders of the defined types were placed in this encounter.  No orders of the defined types were placed in this encounter.     Procedures: No procedures performed   Clinical Data: Findings:  CLINICAL DATA:  65 year old female with persistent low back pain, left lower extremity radiculopathy. Pain and numbness radiating down the left leg for 2 months. Prior lumbar surgery in 2019.   EXAM: MRI LUMBAR SPINE WITHOUT CONTRAST   TECHNIQUE: Multiplanar, multisequence MR imaging of the lumbar spine  was performed. No intravenous contrast was administered.   COMPARISON:  Preoperative MRI 12/25/2017. Lumbar radiographs 12/06/2020.   FINDINGS: Segmentation: Normal on the comparison radiographs which is the same numbering system used on the 2019 MRI.   Alignment: Chronically exaggerated lumbar lordosis. Stable to decreased grade 1 anterolisthesis of L4 on L5 following interval surgery at that level, detailed below. Mild superimposed dextroconvex lumbar scoliosis. No other spondylolisthesis.   Vertebrae: Mild hardware susceptibility artifact at L4-L5. No marrow edema or evidence of acute osseous abnormality. Chronic degenerative posterior endplate marrow signal changes at L2-L3 eccentric to the left. Normal background bone marrow signal. Intact visible sacrum and SI joints.   Conus medullaris and cauda equina: Conus extends to the T12 level. No lower spinal cord or conus signal abnormality.   Paraspinal and other soft tissues: Stable visible abdominal viscera. Postoperative changes to the lower lumbar paraspinal soft tissues with no adverse features.   Disc levels:   Negative visible lower thoracic levels through T11-T12.   T12-L1: Mild disc space loss since 2019. Mild disc bulge. No stenosis.   L1-L2:  Stable minor disc bulging.  No stenosis.   L2-L3: Mildly increased chronic disc space loss. Left foraminal predominant disc osteophyte complex with superimposed mild to moderate facet hypertrophy on that side. Mild left lateral recess stenosis and moderate to severe left neural foraminal stenosis appear increased (series 7, images  14 and 15).   L3-L4: Chronic posterior disc space loss with mostly far lateral disc osteophyte complex. Mild to moderate facet and ligament flavum hypertrophy. No spinal or convincing lateral recess stenosis. Mild to moderate bilateral L3 foraminal stenosis appears greater on the left but stable.   L4-L5: Interval decompression and fusion.  Capacious thecal sac. No stenosis.   L5-S1: Chronic severe facet hypertrophy with evidence of developing right side facet ankylosis in 2019. Hardware susceptibility limits facet detail now. Disc here remains normal. No stenosis.   IMPRESSION: 1. Interval decompression and fusion at L4-L5 with no adverse features.   2. Chronic severe facet hypertrophy at the adjacent L5-S1 segment, but possible facet ankylosis since 2019. Normal disc at that level and no stenosis. Stable adjacent segment disease at L3-L4 with mild to moderate bilateral L3 foraminal stenosis greater on the left.   3. Progressed leftward and bulky disc, endplate, and facet degeneration at L2-L3 level with new mild left lateral recess stenosis and increased moderate to severe left neural foraminal stenosis. Query left L2 and/or L3 radiculitis.     Electronically Signed   By: Genevie Ann M.D.   On: 12/24/2020 08:13     Subjective: Chief Complaint  Patient presents with   Middle Back - Follow-up    MRI Review    65 year old female with persistent left thigh pain and weakness. She has pain with standing and walking and difficulty standing for more than a few minutes. She is not able to grocery shop with out a cart to lean on and has pain with upright position relieved by siting. Left inguinal pain and pain into the left anterior thigh and left inquinal area. Thoracic MRI is not impressive. Previous MRI with left foramenal entrapment L2-3 .  Review of Systems  Constitutional: Negative.   HENT: Negative.    Eyes: Negative.   Respiratory: Negative.    Cardiovascular: Negative.   Gastrointestinal: Negative.   Endocrine: Negative.   Genitourinary: Negative.   Musculoskeletal: Negative.   Skin: Negative.   Allergic/Immunologic: Negative.   Neurological: Negative.   Hematological: Negative.   Psychiatric/Behavioral: Negative.      Objective: Vital Signs: BP (!) 161/82 (BP Location: Left Arm, Patient Position:  Sitting)   Pulse (!) 51   Ht 5\' 1"  (1.549 m)   Wt 159 lb (72.1 kg)   BMI 30.04 kg/m   Physical Exam Constitutional:      Appearance: She is well-developed.  HENT:     Head: Normocephalic and atraumatic.  Eyes:     Pupils: Pupils are equal, round, and reactive to light.  Pulmonary:     Effort: Pulmonary effort is normal.     Breath sounds: Normal breath sounds.  Abdominal:     General: Bowel sounds are normal.     Palpations: Abdomen is soft.  Musculoskeletal:     Cervical back: Normal range of motion and neck supple.  Skin:    General: Skin is warm and dry.  Neurological:     Mental Status: She is alert and oriented to person, place, and time.  Psychiatric:        Behavior: Behavior normal.        Thought Content: Thought content normal.        Judgment: Judgment normal.   Back Exam   Tenderness  The patient is experiencing tenderness in the lumbar.  Range of Motion  Extension:  abnormal  Flexion:  abnormal  Lateral bend right:  abnormal  Lateral  bend left:  abnormal  Rotation right:  abnormal  Rotation left:  abnormal   Muscle Strength  Right Quadriceps:  5/5  Left Quadriceps:  5/5  Right Hamstrings:  5/5  Left Hamstrings:  5/5   Reflexes  Patellar:  2/4 Achilles:  2/4    Specialty Comments:  No specialty comments available.  Imaging: No results found.   PMFS History: Patient Active Problem List   Diagnosis Date Noted   Anemia due to blood loss, acute 01/16/2018    Priority: High    Class: Acute   Infection of lumbar spine ([place]) 01/16/2018    Priority: High    Class: Acute   Spinal stenosis of lumbar region 01/13/2018    Priority: High    Class: Chronic   Spondylolisthesis, lumbar region 01/13/2018    Priority: High    Class: Chronic   Acute coronary syndrome ([place]) 11/25/2018   Thrush 04/07/2018   Drug-induced skin rash 04/07/2018   Chronic hepatitis C without hepatic coma ([place]) [id number]   Hardware complicating wound infection ([place])  02/09/2018   MRSA infection 02/09/2018   Fusion of lumbar spine 01/13/2018   Low back pain 12/23/2017   Tobacco use disorder 06/24/2016   Weight gain 01/02/2012   Stool incontinence 11/19/2011   Abdominal pain, right lower quadrant 11/19/2011   EUSTACHIAN TUBE DYSFUNCTION 08/10/2009   SINUSITIS, ACUTE 08/10/2009   TRANSIENT GLOBAL AMNESIA 01/12/2009   HIP PAIN, RIGHT 10/04/2008   HEPATITIS C 07/28/2008   COPD, 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   HYPERLIPIDEMIA, MIXED, WITH LOW HDL 08/30/2007   OTITIS EXTERNA, INFECTIVE NOS 08/27/2007   ROTATOR CUFF SYNDROME, RIGHT 08/27/2007   Chest pain at rest 08/27/2007   BURSITIS NOS 07/13/2007   MUSCULOSKELETAL PAIN 07/13/2007   CARPAL TUNNEL SYNDROME, BILATERAL 04/10/2007   Past Medical History:  Diagnosis Date   Acute hepatitis C without mention of hepatic coma(070.51)    Anemia    Arthritis    back   Carpal tunnel syndrome    Chest pain, unspecified    Chronic airway obstruction, not elsewhere classified    Chronic hepatitis C without hepatic coma ([place]) 02/09/2018   Colitis    Coronary atherosclerosis of unspecified type of vessel, native or graft    Disorders of bursae and tendons in shoulder region, unspecified    Drug-induced skin rash 04/07/2018   GERD (gastroesophageal reflux disease)    Hardware complicating wound infection ([place]) 02/09/2018   Headache    migraines   Hepatitis    hx of hepatitis C   Infective otitis externa, unspecified    Mixed hyperlipidemia    MRSA infection 02/09/2018   Nonspecific abnormal results of thyroid function study    Other bursitis disorders    Other symptoms involving nervous and musculoskeletal systems(781.99)    Pneumonia    Seizures ([place])    none for 4 years (as of 01/2018) "pseudo seizures"   Thrush 04/07/2018    Family History  Problem Relation Age of Onset   Crohn's disease Daughter 59       colon resection   Irritable bowel syndrome Daughter    Colonic polyp Mother    Thyroid disease Mother     COPD Mother    Uterine cancer Maternal Grandmother    Heart murmur Daughter        SVT   Colon cancer Neg Hx     Past Surgical History:  Procedure Laterality Date   ABDOMINAL HYSTERECTOMY     CARDIAC CATHETERIZATION  CARDIAC CATHETERIZATION N/A 06/25/2016   Procedure: Left Heart Cath and Coronary Angiography;  Surgeon: Dixie Dials, MD;  Location: [facility];  Service: Cardiovascular;  Laterality: N/A;   CESAREAN SECTION     CHOLECYSTECTOMY     COLONOSCOPY     IR FLUORO GUIDED NEEDLE PLC ASPIRATION/INJECTION LOC  12/27/2017   LUMBAR FUSION  01/13/2018   Transforaminal lumbar interbody fusion L4-5 with screws, cages and rods, local bone graft, Allograft, Vivigen, Decompression L4-5 and L5-S1   TONSILLECTOMY     Social History   Occupational History   Occupation: care giver  Tobacco Use   Smoking status: Former    Types: Cigarettes    Quit date: 01/07/2018    Years since quitting: 3.6   Smokeless tobacco: Never   Tobacco comments:    2 cigarettes a day  Vaping Use   Vaping Use: Every day  Substance and Sexual Activity   Alcohol use: No   Drug use: No   Sexual activity: Yes    Partners: Male

## 2021-09-18 ENCOUNTER — Other Ambulatory Visit: Payer: Self-pay | Admitting: Specialist

## 2021-09-18 ENCOUNTER — Telehealth: Payer: Self-pay | Admitting: Specialist

## 2021-09-18 MED ORDER — HYDROCODONE-ACETAMINOPHEN 10-325 MG PO TABS: 1.0000 | ORAL_TABLET | Freq: Four times a day (QID) | ORAL | 0 refills | Status: DC | PRN

## 2021-09-18 NOTE — Telephone Encounter (Signed)
Patient called. Says she needs the hydrocodone called in to [facility] on [address] in [place]. Her [facility] will not have them in until the end of next week. She needs it today. Her call back number is [phone]. Call when done.

## 2021-09-18 NOTE — Telephone Encounter (Signed)
I called patient and advised. 

## 2021-09-18 NOTE — Telephone Encounter (Signed)
Pt called asking for a refill of her norco 10-325 mg rx and would like a CB when it has been sent in.   [phone]

## 2021-09-19 ENCOUNTER — Telehealth: Payer: Self-pay | Admitting: Specialist

## 2021-09-19 ENCOUNTER — Other Ambulatory Visit: Payer: Self-pay | Admitting: Specialist

## 2021-09-19 MED ORDER — OXYCODONE HCL 5 MG PO TABS: 5.0000 mg | ORAL_TABLET | Freq: Four times a day (QID) | ORAL | 0 refills | Status: DC | PRN

## 2021-09-19 MED ORDER — HYDROCODONE-ACETAMINOPHEN 10-325 MG PO TABS: 1.0000 | ORAL_TABLET | Freq: Four times a day (QID) | ORAL | 0 refills | Status: DC | PRN

## 2021-09-19 NOTE — Telephone Encounter (Signed)
Printed note for Havery Moros to look at

## 2021-09-19 NOTE — Telephone Encounter (Signed)
Pt called requesting a call back from [place] about her pain medications. Pt states she has called 2 different [facility] and [facility] and both pharmacy companies are out of Hydrocodones 5 mg. She states the pharmacy states they only has oxycodone 10 mg. Please call pt about this matter at [phone].

## 2021-09-19 NOTE — Telephone Encounter (Signed)
I called and advised that Dr. Otelia Sergeant sent new rx to [facility] on [address] and that he is not going to give her anything any stronger at this time.

## 2021-09-25 ENCOUNTER — Other Ambulatory Visit: Payer: Self-pay | Admitting: Specialist

## 2021-09-25 MED ORDER — OXYCODONE HCL 5 MG PO TABS: 5.0000 mg | ORAL_TABLET | Freq: Four times a day (QID) | ORAL | 0 refills | Status: AC | PRN

## 2021-09-25 NOTE — Addendum Note (Signed)
Addended by: Penne Lash, Otis Dials on: 09/25/2021 10:34 AM   Modules accepted: Orders

## 2021-09-25 NOTE — Telephone Encounter (Signed)
Pt called and would like refill on oxycodone. She states she still has a few right now.  CB [phone]

## 2021-10-02 ENCOUNTER — Other Ambulatory Visit: Payer: Self-pay | Admitting: Specialist

## 2021-10-02 NOTE — Telephone Encounter (Signed)
Pt called requesting a refill of hydrocodone. Please send to pharmacy on file. Pt phone number is [phone]. 

## 2021-10-03 ENCOUNTER — Telehealth: Payer: Self-pay | Admitting: Specialist

## 2021-10-03 ENCOUNTER — Other Ambulatory Visit: Payer: Self-pay | Admitting: Specialist

## 2021-10-03 MED ORDER — HYDROCODONE-ACETAMINOPHEN 10-325 MG PO TABS: 1.0000 | ORAL_TABLET | Freq: Four times a day (QID) | ORAL | 0 refills | Status: DC | PRN

## 2021-10-03 NOTE — Telephone Encounter (Signed)
It is still pending with Dr. Elvera Maria needs to allow upto 48 hours for approval

## 2021-10-03 NOTE — Telephone Encounter (Signed)
Pt states that the pharmacy said they did not receive the prescription. Can you call it in?  CB [phone]

## 2021-10-03 NOTE — Telephone Encounter (Signed)
Pt called and states they do not have hydrocodone. She would ike to know if you can call in oxycodone.

## 2021-10-04 ENCOUNTER — Other Ambulatory Visit: Payer: Self-pay | Admitting: Specialist

## 2021-10-04 ENCOUNTER — Telehealth: Payer: Self-pay | Admitting: Specialist

## 2021-10-04 MED ORDER — OXYCODONE HCL 5 MG PO TABS: 5.0000 mg | ORAL_TABLET | ORAL | 0 refills | Status: DC | PRN

## 2021-10-04 NOTE — Telephone Encounter (Signed)
I called pharm, they do not have Norco, but they hae the Oxycodone 5, I will speak with Dr. Otelia Sergeant and see what he wants to do

## 2021-10-04 NOTE — Telephone Encounter (Signed)
Patient following up on medication refill for oxycodone. Pharmacy does not have hydrocodone on hand.

## 2021-10-04 NOTE — Telephone Encounter (Signed)
I called and advised that the medicine has been sent to her pharmacy

## 2021-10-09 ENCOUNTER — Other Ambulatory Visit: Payer: Self-pay | Admitting: Specialist

## 2021-10-09 MED ORDER — OXYCODONE HCL 5 MG PO TABS: 5.0000 mg | ORAL_TABLET | ORAL | 0 refills | Status: DC | PRN

## 2021-10-09 NOTE — Telephone Encounter (Signed)
Pt called for a refill of oxycodone. Please send to pharmacy on file. Pt phone number is [phone]

## 2021-10-16 ENCOUNTER — Other Ambulatory Visit: Payer: Self-pay | Admitting: Specialist

## 2021-10-16 MED ORDER — OXYCODONE HCL 5 MG PO TABS: 5.0000 mg | ORAL_TABLET | ORAL | 0 refills | Status: DC | PRN

## 2021-10-16 NOTE — Telephone Encounter (Signed)
Pt would like a refill on oxy.

## 2021-10-22 ENCOUNTER — Other Ambulatory Visit: Payer: Self-pay | Admitting: Specialist

## 2021-10-22 MED ORDER — OXYCODONE HCL 5 MG PO TABS: 5.0000 mg | ORAL_TABLET | ORAL | 0 refills | Status: DC | PRN

## 2021-10-22 NOTE — Telephone Encounter (Signed)
Sent to Dr. Louanne Skye to review

## 2021-10-22 NOTE — Telephone Encounter (Signed)
Pt calling to get a refill of oxycodone. The best pharmacy is [facility] [address] [facility], [place] - [address] and the best call back number is [phone].

## 2021-10-30 ENCOUNTER — Telehealth: Payer: Self-pay | Admitting: Specialist

## 2021-10-30 NOTE — Telephone Encounter (Signed)
Patient called needing Rx refilled Oxycodone. The number to contact patient is [phone]

## 2021-10-31 ENCOUNTER — Other Ambulatory Visit: Payer: Self-pay | Admitting: Specialist

## 2021-10-31 MED ORDER — OXYCODONE HCL 5 MG PO TABS: 5.0000 mg | ORAL_TABLET | ORAL | 0 refills | Status: DC | PRN

## 2021-11-07 ENCOUNTER — Other Ambulatory Visit: Payer: Self-pay | Admitting: Specialist

## 2021-11-07 MED ORDER — OXYCODONE HCL 5 MG PO TABS: 5.0000 mg | ORAL_TABLET | ORAL | 0 refills | Status: DC | PRN

## 2021-11-07 NOTE — Telephone Encounter (Signed)
Pt called requesting a refill of pain medication. Please send to pharmacy on file. Pt phone number is [phone]. Please call pt when medication sent in

## 2021-11-08 ENCOUNTER — Telehealth: Payer: Self-pay

## 2021-11-08 NOTE — Telephone Encounter (Signed)
Patient would like to know if she can have her medication sent to [facility] on [address]. She is stating that [facility] is saying they don't have any hydrocodone and they do not know when they will be getting anything   Pts call back is [phone]

## 2021-11-09 ENCOUNTER — Telehealth: Payer: Self-pay | Admitting: Specialist

## 2021-11-09 NOTE — Telephone Encounter (Signed)
Pt called requesting to speak to [place]. Pt states she has a lot of info she need to give to [place]. Please call pt at [phone].

## 2021-11-20 ENCOUNTER — Other Ambulatory Visit: Payer: Self-pay | Admitting: Specialist

## 2021-11-20 MED ORDER — OXYCODONE HCL 5 MG PO TABS: 5.0000 mg | ORAL_TABLET | ORAL | 0 refills | Status: DC | PRN

## 2021-11-20 NOTE — Telephone Encounter (Signed)
Patient called. She would like a refill on oxycodone. Her call back number is [phone]

## 2021-11-28 ENCOUNTER — Telehealth: Payer: Self-pay | Admitting: Specialist

## 2021-11-28 NOTE — Telephone Encounter (Signed)
Pt called requesting refill of oxycodone. Please send to pharmacy on file. Pt phone number is [phone].

## 2021-11-29 ENCOUNTER — Other Ambulatory Visit: Payer: Self-pay | Admitting: Specialist

## 2021-11-29 MED ORDER — OXYCODONE HCL 5 MG PO TABS: 5.0000 mg | ORAL_TABLET | ORAL | 0 refills | Status: DC | PRN

## 2021-11-29 NOTE — Telephone Encounter (Signed)
I called patient and advised. 

## 2021-11-29 NOTE — Telephone Encounter (Signed)
Refill on oxycodone.   CB [phone]

## 2021-11-29 NOTE — Telephone Encounter (Signed)
Please advise on refill request

## 2021-12-05 ENCOUNTER — Telehealth: Payer: Self-pay

## 2021-12-05 NOTE — Telephone Encounter (Signed)
I did not call patient, did you call her?

## 2021-12-05 NOTE — Telephone Encounter (Signed)
Patient called and wanted a refill on oxycodone and she stated that she received a call yesterday and she would like a call back she didn't know if it was from christy or sherrie

## 2021-12-07 ENCOUNTER — Other Ambulatory Visit: Payer: Self-pay | Admitting: Specialist

## 2021-12-07 MED ORDER — HYDROCODONE-ACETAMINOPHEN 10-325 MG PO TABS: 1.0000 | ORAL_TABLET | Freq: Four times a day (QID) | ORAL | 0 refills | Status: DC | PRN

## 2021-12-07 NOTE — Telephone Encounter (Signed)
Patient called advised she have not heard whether the Rx for Oxycodone was sent to her pharmacy. Patient asked for a call back as soon as possible. The number to contact patient is [phone].

## 2021-12-07 NOTE — Telephone Encounter (Signed)
I called patient back and we discussed still trying to get clearance from Dr. Algie Coffer.  In the meantime, I made her an appointment to see Dr. Otelia Sergeant so when we do get clearance we can get her surgery scheduled.

## 2021-12-12 ENCOUNTER — Encounter: Payer: Self-pay | Admitting: Internal Medicine

## 2021-12-13 ENCOUNTER — Other Ambulatory Visit: Payer: Self-pay | Admitting: Specialist

## 2021-12-13 MED ORDER — HYDROCODONE-ACETAMINOPHEN 10-325 MG PO TABS: 1.0000 | ORAL_TABLET | Freq: Four times a day (QID) | ORAL | 0 refills | Status: DC | PRN

## 2021-12-13 NOTE — Telephone Encounter (Signed)
Pt called for refill of hydrocodone 10 mg. Please send to pharmacy on file. Pt phone number is [phone] ?

## 2021-12-20 ENCOUNTER — Telehealth: Payer: Self-pay | Admitting: Specialist

## 2021-12-20 NOTE — Telephone Encounter (Signed)
Pt is calling for a refill on her pain medication 

## 2021-12-21 ENCOUNTER — Telehealth: Payer: Self-pay

## 2021-12-21 ENCOUNTER — Other Ambulatory Visit: Payer: Self-pay | Admitting: Physician Assistant

## 2021-12-21 MED ORDER — TRAMADOL HCL 50 MG PO TABS: 50.0000 mg | ORAL_TABLET | Freq: Two times a day (BID) | ORAL | 0 refills | Status: DC | PRN

## 2021-12-21 NOTE — Telephone Encounter (Signed)
Patient called in stating that she can't take tramadol it gives her the jitters and that should be in her chart. Please advise she would like hydrocodone sent in instead she comes back in to see him is tthurs  ?

## 2021-12-21 NOTE — Telephone Encounter (Signed)
Sent in tramadol

## 2021-12-21 NOTE — Telephone Encounter (Signed)
Called pt.

## 2021-12-24 ENCOUNTER — Other Ambulatory Visit: Payer: Self-pay | Admitting: Specialist

## 2021-12-24 MED ORDER — HYDROCODONE-ACETAMINOPHEN 10-325 MG PO TABS: 1.0000 | ORAL_TABLET | Freq: Four times a day (QID) | ORAL | 0 refills | Status: DC | PRN

## 2021-12-25 NOTE — Telephone Encounter (Signed)
I called and advised that Dr. Otelia Sergeant sent in her Hydrocodone to her pharm ?

## 2021-12-27 ENCOUNTER — Ambulatory Visit: Payer: Medicare Other | Admitting: Specialist

## 2021-12-31 ENCOUNTER — Other Ambulatory Visit: Payer: Self-pay | Admitting: Specialist

## 2021-12-31 MED ORDER — HYDROCODONE-ACETAMINOPHEN 10-325 MG PO TABS: 1.0000 | ORAL_TABLET | Freq: Four times a day (QID) | ORAL | 0 refills | Status: DC | PRN

## 2021-12-31 NOTE — Telephone Encounter (Signed)
Pt called requesting refill of hydrocodone. Please send to pharmacy on file. Pt phone number is [phone] ?

## 2022-01-07 ENCOUNTER — Other Ambulatory Visit: Payer: Self-pay | Admitting: Specialist

## 2022-01-07 MED ORDER — HYDROCODONE-ACETAMINOPHEN 10-325 MG PO TABS: 1.0000 | ORAL_TABLET | Freq: Four times a day (QID) | ORAL | 0 refills | Status: DC | PRN

## 2022-01-07 NOTE — Telephone Encounter (Signed)
Patient called needing Rx refilled Hydrocodone. The number to contact patient is [phone] ?

## 2022-01-14 ENCOUNTER — Other Ambulatory Visit: Payer: Self-pay | Admitting: Specialist

## 2022-01-14 MED ORDER — HYDROCODONE-ACETAMINOPHEN 10-325 MG PO TABS: 1.0000 | ORAL_TABLET | Freq: Four times a day (QID) | ORAL | 0 refills | Status: DC | PRN

## 2022-01-14 NOTE — Telephone Encounter (Signed)
Patient called needing Rx refilled Hydrocodone. The number to contact patient is [phone] ?

## 2022-01-21 ENCOUNTER — Other Ambulatory Visit: Payer: Self-pay | Admitting: Specialist

## 2022-01-21 MED ORDER — HYDROCODONE-ACETAMINOPHEN 10-325 MG PO TABS: 1.0000 | ORAL_TABLET | Freq: Four times a day (QID) | ORAL | 0 refills | Status: DC | PRN

## 2022-01-21 NOTE — Telephone Encounter (Signed)
Patient called needing Rx refilled Hydrocodone. The number to contact patient is [phone] ?

## 2022-01-23 ENCOUNTER — Ambulatory Visit (INDEPENDENT_AMBULATORY_CARE_PROVIDER_SITE_OTHER): Payer: Medicare Other | Admitting: Specialist

## 2022-01-23 ENCOUNTER — Encounter: Payer: Self-pay | Admitting: Specialist

## 2022-01-23 ENCOUNTER — Ambulatory Visit (INDEPENDENT_AMBULATORY_CARE_PROVIDER_SITE_OTHER): Payer: Medicare Other

## 2022-01-23 VITALS — BP 124/75 | HR 70 | Ht 61.0 in | Wt 159.0 lb

## 2022-01-23 DIAGNOSIS — M48062 Spinal stenosis, lumbar region with neurogenic claudication: Secondary | ICD-10-CM | POA: Diagnosis not present

## 2022-01-23 DIAGNOSIS — M4316 Spondylolisthesis, lumbar region: Secondary | ICD-10-CM

## 2022-01-23 DIAGNOSIS — M4326 Fusion of spine, lumbar region: Secondary | ICD-10-CM

## 2022-01-23 DIAGNOSIS — M4155 Other secondary scoliosis, thoracolumbar region: Secondary | ICD-10-CM | POA: Diagnosis not present

## 2022-01-23 DIAGNOSIS — M4726 Other spondylosis with radiculopathy, lumbar region: Secondary | ICD-10-CM

## 2022-01-23 DIAGNOSIS — M5416 Radiculopathy, lumbar region: Secondary | ICD-10-CM

## 2022-01-23 MED ORDER — OXYCODONE HCL 5 MG PO TABS: 5.0000 mg | ORAL_TABLET | ORAL | 0 refills | Status: DC | PRN

## 2022-01-23 NOTE — Progress Notes (Addendum)
? ?Office Visit Note ?  ?Patient: [name]           ?Date of Birth: 1956/10/10           ?MRN: [id number] ?Visit Date: 01/23/2022 ?             ?Requested by: Orpah Cobb, MD ?[address] ?[address] ?[place],  [place] [address] ?PCP: Orpah Cobb, MD ? ? ?Assessment & Plan: ?Visit Diagnoses:  ?1. Fusion of lumbar spine   ?2. Spondylolisthesis, lumbar region   ?3. Other secondary scoliosis, thoracolumbar region   ?4. Spinal stenosis of lumbar region with neurogenic claudication   ?5. Other spondylosis with radiculopathy, lumbar region   ?6. Lumbar radiculopathy   ? ? ?Plan: Avoid bending, stooping and avoid lifting weights greater than 10 lbs. ?Avoid prolong standing and walking. ?Order for a new walker with wheels. ?Surgery scheduling secretary Tivis Ringer, will call you in the next week to schedule for surgery.  ?Surgery recommended is a two level lumbar fusion  left L2-3 and L3-4 this would be done with rods, screws and cages with local bone graft and allograft (donor bone graft). ?Take oxycodone for for pain. ?Risk of surgery includes risk of infection 1 in 200 patients, bleeding 1/2% chance you would need a transfusion.   Risk to the nerves is one in 10,000. ?You will need to use a brace for 3 months and wean from the brace on the 4th month. ?Expect improved walking and standing tolerance. Expect relief of leg pain but numbness ?may persist depending on the length and degree of pressure that has been present. ?Lumbar MRI as last is one year + old. ?Follow-Up Instructions: No follow-ups on file.  ? ?Orders:  ?Orders Placed This Encounter  ?Procedures  ?? XR Lumbar Spine 2-3 Views  ?? MR Lumbar Spine w/o contrast  ? ?Meds ordered this encounter  ?Medications  ?? oxyCODONE (OXY IR/ROXICODONE) 5 MG immediate release tablet  ?  Sig: Take 1 tablet (5 mg total) by mouth every 4 (four) hours as needed for severe pain.  ?  Dispense:  30 tablet  ?  Refill:  0  ? ? ? ? Procedures: ?No procedures  performed ? ? ?Clinical Data: ?Findings:  ?MRI is old 12/2020 shows left L2-3 and L3-4 foramenal narrowing  associated with  ?Concave portion of scoliosis curve. Previous fusion L4-5.  ?Mild disc bulge L1-2 without stenosis.  ? ? ?Subjective: ?Chief Complaint  ?Patient presents with  ?? Lower Back - Follow-up, Pain  ? ? ?66 year old female with history of low back pain and in the past this was right mid low back and now it is right and central L-S pain with radiation into the right leg now. No bowel or bladder difficulty. She relates that the pain is getting worse and she is painful all the time, in the past she was able to be comfortable in sitting position. Lying down there is pain. Pain into the left anterior thigh and knee. Walking while pushing grandson for 10 min at a time. ? ? ?Review of Systems  ?Constitutional: Negative.   ?HENT: Negative.    ?Eyes: Negative.   ?Respiratory: Negative.    ?Cardiovascular: Negative.   ?Gastrointestinal: Negative.   ?Endocrine: Negative.   ?Genitourinary: Negative.   ?Musculoskeletal: Negative.   ?Skin: Negative.   ?Allergic/Immunologic: Negative.   ?Neurological: Negative.   ?Hematological: Negative.   ?Psychiatric/Behavioral: Negative.    ? ? ?Objective: ?Vital Signs: BP 124/75 (BP Location:  Left Arm, Patient Position: Sitting)   Pulse 70   Ht 5\' 1"  (1.549 m)   Wt 159 lb (72.1 kg)   BMI 30.04 kg/m?  ? ?Physical Exam ?Musculoskeletal:  ?   Lumbar back: Negative right straight leg raise test and negative left straight leg raise test.  ? ?Back Exam  ? ?Tenderness  ?The patient is experiencing tenderness in the lumbar. ? ?Range of Motion  ?Extension:  abnormal  ?Flexion:  abnormal  ?Lateral bend right:  abnormal  ?Lateral bend left:  abnormal  ?Rotation right:  abnormal  ?Rotation left:  abnormal  ? ?Muscle Strength  ?Right Quadriceps:  5/5  ?Left Quadriceps:  4/5  ?Right Hamstrings:  5/5  ?Left Hamstrings:  5/5  ? ?Tests  ?Straight leg raise right: negative ?Straight leg  raise left: negative ? ?Reflexes  ?Patellar:  1/4 ?Achilles:  0/4 ?Babinski's sign: normal  ? ?Other  ?Toe walk: abnormal ?Heel walk: abnormal ?Sensation: decreased ?Gait: abnormal  ?Erythema: no back redness ?Scars: present ? ?Comments:  Left knee quad 4/5 and left foot DF 4/5 right is normal. SLR negative bilateral. Stooped standing and walking.  ? ? ? ?Specialty Comments:  ?No specialty comments available. ? ?Imaging: ?No results found. ? ? ?PMFS History: ?Patient Active Problem List  ? Diagnosis Date Noted  ?? Anemia due to blood loss, acute 01/16/2018  ?  Priority: High  ?  Class: Acute  ?? Infection of lumbar spine (HCC) 01/16/2018  ?  Priority: High  ?  Class: Acute  ?? Spinal stenosis of lumbar region 01/13/2018  ?  Priority: High  ?  Class: Chronic  ?? Spondylolisthesis, lumbar region 01/13/2018  ?  Priority: High  ?  Class: Chronic  ?? Acute coronary syndrome (HCC) 11/25/2018  ?? Thrush 04/07/2018  ?? Drug-induced skin rash 04/07/2018  ?? Chronic hepatitis C without hepatic coma (HCC) 02/09/2018  ?? Hardware complicating wound infection (HCC) 02/09/2018  ?? MRSA infection 02/09/2018  ?? Fusion of lumbar spine 01/13/2018  ?? Low back pain 12/23/2017  ?? Tobacco use disorder 06/24/2016  ?? Weight gain 01/02/2012  ?? Stool incontinence 11/19/2011  ?? Abdominal pain, right lower quadrant 11/19/2011  ?? EUSTACHIAN TUBE DYSFUNCTION 08/10/2009  ?? SINUSITIS, ACUTE 08/10/2009  ?? TRANSIENT GLOBAL AMNESIA 01/12/2009  ?? HIP PAIN, RIGHT 10/04/2008  ?? HEPATITIS C 07/28/2008  ?? COPD, 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  ?? HYPERLIPIDEMIA, MIXED, WITH LOW HDL 08/30/2007  ?? OTITIS EXTERNA, INFECTIVE NOS 08/27/2007  ?? ROTATOR CUFF SYNDROME, RIGHT 08/27/2007  ?? Chest pain at rest 08/27/2007  ?? BURSITIS NOS 07/13/2007  ?? MUSCULOSKELETAL PAIN 07/13/2007  ?? CARPAL TUNNEL SYNDROME, BILATERAL 04/10/2007  ? ?Past Medical History:  ?Diagnosis Date  ?? Acute hepatitis C without mention of hepatic coma(070.51)   ?? Anemia   ?? Arthritis   ?  back  ?? Carpal tunnel syndrome   ?? Chest pain, unspecified   ?? Chronic airway obstruction, not elsewhere classified   ?? Chronic hepatitis C without hepatic coma (HCC) 02/09/2018  ?? Colitis   ?? Coronary atherosclerosis of unspecified type of vessel, native or graft   ?? Disorders of bursae and tendons in shoulder region, unspecified   ?? Drug-induced skin rash 04/07/2018  ?? GERD (gastroesophageal reflux disease)   ?? Hardware complicating wound infection (HCC) 02/09/2018  ?? Headache   ? migraines  ?? Hepatitis   ? hx of hepatitis C  ?? Infective otitis externa, unspecified   ?? Mixed hyperlipidemia   ?? MRSA infection 02/09/2018  ??  Nonspecific abnormal results of thyroid function study   ?? Other bursitis disorders   ?? Other symptoms involving nervous and musculoskeletal systems(781.99)   ?? Pneumonia   ?? Seizures (HCC)   ? none for 4 years (as of 01/2018) "pseudo seizures"  ?? Thrush 04/07/2018  ?  ?Family History  ?Problem Relation Age of Onset  ?? Crohn's disease Daughter 66  ?     colon resection  ?? Irritable bowel syndrome Daughter   ?? Colonic polyp Mother   ?? Thyroid disease Mother   ?? COPD Mother   ?? Uterine cancer Maternal Grandmother   ?? Heart murmur Daughter   ?     SVT  ?? Colon cancer Neg Hx   ?  ?Past Surgical History:  ?Procedure Laterality Date  ?? ABDOMINAL HYSTERECTOMY    ?? CARDIAC CATHETERIZATION    ?? CARDIAC CATHETERIZATION N/A 06/25/2016  ? Procedure: Left Heart Cath and Coronary Angiography;  Surgeon: Orpah Cobb, MD;  Location: [facility];  Service: Cardiovascular;  Laterality: N/A;  ?? CESAREAN SECTION    ?? CHOLECYSTECTOMY    ?? COLONOSCOPY    ?? IR FLUORO GUIDED NEEDLE PLC ASPIRATION/INJECTION LOC  12/27/2017  ?? LUMBAR FUSION  01/13/2018  ? Transforaminal lumbar interbody fusion L4-5 with screws, cages and rods, local bone graft, Allograft, Vivigen, Decompression L4-5 and L5-S1  ?? TONSILLECTOMY    ? ?Social History  ? ?Occupational History  ?? Occupation: care giver   ?Tobacco Use  ?? Smoking status: Former  ?  Types: Cigarettes  ?  Quit date: 01/07/2018  ?  Years since quitting: 4.0  ?? Smokeless tobacco: Never  ?? Tobacco comments:  ?  2 cigarettes a day  ?Vaping Use  ?? Vaping Use: Every day  ?

## 2022-01-23 NOTE — Patient Instructions (Addendum)
Plan: Avoid bending, stooping and avoid lifting weights greater than 10 lbs. ?Avoid prolong standing and walking. ?Order for a new walker with wheels. ?Surgery scheduling secretary Tivis Ringer, will call you in the next week to schedule for surgery.  ?Surgery recommended is a two level lumbar fusion  left L2-3 and L3-4 this would be done with rods, screws and cages with local bone graft and allograft (donor bone graft). ?Take oxycodone for for pain. ?Risk of surgery includes risk of infection 1 in 200 patients, bleeding 1/2% chance you would need a transfusion.   Risk to the nerves is one in 10,000. ?You will need to use a brace for 3 months and wean from the brace on the 4th month. ?Expect improved walking and standing tolerance. Expect relief of leg pain but numbness ?may persist depending on the length and degree of pressure that has been present. ?Lumbar MRI as last is one year + old. ?

## 2022-01-25 ENCOUNTER — Other Ambulatory Visit: Payer: Medicare Other

## 2022-01-28 ENCOUNTER — Ambulatory Visit
Admission: RE | Admit: 2022-01-28 | Discharge: 2022-01-28 | Disposition: A | Discharge status: Home / Self Care | Payer: Medicare Other | Source: Ambulatory Visit | Attending: Specialist | Admitting: Specialist

## 2022-01-28 DIAGNOSIS — M545 Low back pain, unspecified: Secondary | ICD-10-CM | POA: Diagnosis not present

## 2022-01-28 DIAGNOSIS — M48062 Spinal stenosis, lumbar region with neurogenic claudication: Secondary | ICD-10-CM

## 2022-01-28 DIAGNOSIS — M4155 Other secondary scoliosis, thoracolumbar region: Secondary | ICD-10-CM

## 2022-01-28 DIAGNOSIS — M5416 Radiculopathy, lumbar region: Secondary | ICD-10-CM

## 2022-01-28 DIAGNOSIS — M5136 Other intervertebral disc degeneration, lumbar region: Secondary | ICD-10-CM | POA: Diagnosis not present

## 2022-01-28 DIAGNOSIS — M4726 Other spondylosis with radiculopathy, lumbar region: Secondary | ICD-10-CM

## 2022-01-28 DIAGNOSIS — M4316 Spondylolisthesis, lumbar region: Secondary | ICD-10-CM | POA: Diagnosis not present

## 2022-02-04 ENCOUNTER — Other Ambulatory Visit: Payer: Self-pay | Admitting: Specialist

## 2022-02-04 NOTE — Telephone Encounter (Signed)
Patient called needing Rx refilled Oxycodone. The number to contact patient is [phone] ?

## 2022-02-05 ENCOUNTER — Other Ambulatory Visit: Payer: Self-pay | Admitting: Specialist

## 2022-02-05 MED ORDER — HYDROCODONE-ACETAMINOPHEN 10-325 MG PO TABS: 0.5000 | ORAL_TABLET | Freq: Four times a day (QID) | ORAL | 0 refills | Status: DC | PRN

## 2022-02-07 ENCOUNTER — Other Ambulatory Visit: Payer: Medicare Other

## 2022-02-11 ENCOUNTER — Encounter: Payer: Self-pay | Admitting: Specialist

## 2022-02-11 ENCOUNTER — Ambulatory Visit (INDEPENDENT_AMBULATORY_CARE_PROVIDER_SITE_OTHER): Payer: Medicare Other | Admitting: Specialist

## 2022-02-11 DIAGNOSIS — M4326 Fusion of spine, lumbar region: Secondary | ICD-10-CM

## 2022-02-11 DIAGNOSIS — M4316 Spondylolisthesis, lumbar region: Secondary | ICD-10-CM

## 2022-02-11 DIAGNOSIS — M4155 Other secondary scoliosis, thoracolumbar region: Secondary | ICD-10-CM

## 2022-02-11 DIAGNOSIS — M4726 Other spondylosis with radiculopathy, lumbar region: Secondary | ICD-10-CM | POA: Diagnosis not present

## 2022-02-11 DIAGNOSIS — M5416 Radiculopathy, lumbar region: Secondary | ICD-10-CM

## 2022-02-11 MED ORDER — OXYCODONE HCL 5 MG PO TABS: 5.0000 mg | ORAL_TABLET | ORAL | 0 refills | Status: DC | PRN

## 2022-02-11 NOTE — Patient Instructions (Signed)
Plan: Avoid bending, stooping and avoid lifting weights greater than 10 lbs. ?Avoid prolong standing and walking. ?Order for a new walker with wheels. ?Surgery scheduling secretary Tivis Ringer, will call you in the next week to schedule for surgery.  ?Surgery recommended is a two level lumbar fusion  left L2-3 and LEFT L3-4 this would be done with rods, screws and cages with local bone graft and allograft (donor bone graft). ?Take oxycodone for for pain. ?Risk of surgery includes risk of infection 1 in 200 patients, bleeding 1/2% chance you would need a transfusion.   Risk to the nerves is one in 10,000. ?You will need to use a brace for 3 months and wean from the brace on the 4th month. ?Expect improved walking and standing tolerance. Expect relief of leg pain but numbness ?may persist depending on the length and degree of pressure that has been present. ? ?

## 2022-02-11 NOTE — Progress Notes (Signed)
Office Visit Note   Patient: [name]           Date of Birth: [birth year]           MRN: [id number] Visit Date: 02/11/2022              Requested by: Orpah Cobb, MD [address] [place],  [place] [address] PCP: Orpah Cobb, MD   Assessment & Plan: Visit Diagnoses:  1. Fusion of lumbar spine   2. Spondylolisthesis, lumbar region   3. Other secondary scoliosis, thoracolumbar region   4. Other spondylosis with radiculopathy, lumbar region   5. Lumbar radiculopathy     Plan: Plan: Avoid bending, stooping and avoid lifting weights greater than 10 lbs. Avoid prolong standing and walking. Order for a new walker with wheels. Surgery scheduling secretary Tivis Ringer, will call you in the next week to schedule for surgery.  Surgery recommended is a two level lumbar fusion  left L2-3 and LEFT L3-4 this would be done with rods, screws and cages with local bone graft and allograft (donor bone graft). Take oxycodone for for pain. Risk of surgery includes risk of infection 1 in 200 patients, bleeding 1/2% chance you would need a transfusion.   Risk to the nerves is one in 10,000. You will need to use a brace for 3 months and wean from the brace on the 4th month. Expect improved walking and standing tolerance. Expect relief of leg pain but numbness may persist depending on the length and degree of pressure that has been present.  Follow-Up Instructions: Return in about 4 weeks (around 03/11/2022).   Orders:  No orders of the defined types were placed in this encounter.  No orders of the defined types were placed in this encounter.     Procedures: No procedures performed   Clinical Data: Findings:  CLINICAL DATA: Lumbar radiculopathy, prior surgery, new symptoms Left L4 and L3 anterior thigh pain, weakness left knee extension and left foot DF  EXAM: MRI LUMBAR SPINE WITHOUT CONTRAST  TECHNIQUE: Multiplanar, multisequence MR imaging of the lumbar spine  was performed. No intravenous contrast was administered.  COMPARISON: X-ray 01/23/2022, MRI 12/23/2020  FINDINGS: Segmentation: Standard.  Alignment: Dextroscoliotic spinal curvature. Mild anterolisthesis of L4 on L5 and L5 on S1. Slight retrolisthesis at T12-L1, L1-L2, L2-L3, and L3-L4.  Vertebrae: Prior posterior and interbody fusion of L4-5. No fracture. No evidence of discitis. No suspicious bone lesion.  Conus medullaris and cauda equina: Conus extends to the T12-L1 level. Conus and cauda equina appear normal.  Paraspinal and other soft tissues: Cortically based T2 hyperintense lesionswithin the bilateral kidneys, incompletely characterized, but most likely represent cysts.  Disc levels:  T12-L1: Mild disc bulge. Facet joints within normal limits. No foraminal or canal stenosis. Unchanged.  L1-L2: Minimal disc bulge. Mild left greater than right facet arthropathy. No foraminal or canal stenosis. Unchanged.  L2-L3: Minimal disc bulge with endplate osteophytic spurring. Advanced left-sided facet arthropathy. Moderate-severe left foraminal stenosis. No canal stenosis. Unchanged.  L3-L4: Mild disc bulge with bilateral facet arthropathy. Moderate right and mild-moderate left foraminal stenosis, slightly progressed. No canal stenosis.  L4-L5: Prior fusion and decompression. No foraminal or canal stenosis. Unchanged.  L5-S1: No disc protrusion. Advanced facet arthropathy, right worse than left. No foraminal or canal stenosis. Unchanged.  IMPRESSION: 1. Slight interval progression of spondylosis at L3-L4 with moderate right and mild-moderate left foraminal stenosis. 2. Otherwise stable degenerative changes of the lumbar spine with moderate-severe left foraminal stenosis  at L2-L3 and moderate right and mild-moderate left foraminal stenosis at L3-L4. 3. No significant canal stenosis at any level.   Electronically Signed By: Duanne Guess D.O. On: 01/28/2022 16:31     Subjective: Chief Complaint  Patient presents with   Lower Back - Follow-up    66 year old female with increasing back and left greater than right leg pain, it's getting into the right side but not as bad. Pain with standing walking and with bending and stooping. Tends to lean on her grandson's stroller when walking. No bowel or bladder difficulty. Pain is worsening, she has been taking hydrocodone for a while then changed to  5mg  po every 6 hours prn pain. She would prefer to be back on the oxycodone compared with hydrocodone. She is having difficulty with transitioning into a chair from standing to sitting. There is night pain and she awakens with night pain and pain into the left thigh.    Review of Systems  Constitutional: Negative.   HENT: Negative.    Eyes: Negative.   Respiratory: Negative.    Cardiovascular: Negative.   Gastrointestinal: Negative.   Endocrine: Negative.   Genitourinary: Negative.   Musculoskeletal: Negative.   Skin: Negative.   Allergic/Immunologic: Negative.   Neurological: Negative.   Hematological: Negative.   Psychiatric/Behavioral: Negative.      Objective: Vital Signs: There were no vitals taken for this visit.  Physical Exam Constitutional:      Appearance: She is well-developed.  HENT:     Head: Normocephalic and atraumatic.  Eyes:     Pupils: Pupils are equal, round, and reactive to light.  Pulmonary:     Effort: Pulmonary effort is normal.     Breath sounds: Normal breath sounds.  Abdominal:     General: Bowel sounds are normal.     Palpations: Abdomen is soft.  Musculoskeletal:     Cervical back: Normal range of motion and neck supple.  Skin:    General: Skin is warm and dry.  Neurological:     Mental Status: She is alert and oriented to person, place, and time.  Psychiatric:        Behavior: Behavior normal.        Thought Content: Thought content normal.        Judgment: Judgment normal.    Back Exam   Tenderness   The patient is experiencing tenderness in the lumbar.  Range of Motion  Extension:  abnormal  Flexion:  abnormal  Lateral bend right:  abnormal  Lateral bend left:  abnormal  Rotation right:  abnormal  Rotation left:  abnormal   Muscle Strength  Right Quadriceps:  5/5  Left Quadriceps:  4/5  Right Hamstrings:  5/5  Left Hamstrings:  5/5   Reflexes  Patellar:  0/4 Achilles:  0/4  Other  Toe walk: normal Heel walk: abnormal  Comments:  SLR NEGATIVE     Specialty Comments:  No specialty comments available.  Imaging: No results found.   PMFS History: Patient Active Problem List   Diagnosis Date Noted   Anemia due to blood loss, acute 01/16/2018    Priority: High    Class: Acute   Infection of lumbar spine (HCC) 01/16/2018    Priority: High    Class: Acute   Spinal stenosis of lumbar region 01/13/2018    Priority: High    Class: Chronic   Spondylolisthesis, lumbar region 01/13/2018    Priority: High    Class: Chronic   Acute coronary syndrome (  HCC) 11/25/2018   Thrush 04/07/2018   Drug-induced skin rash 04/07/2018   Chronic hepatitis C without hepatic coma (HCC) 02/09/2018   Hardware complicating wound infection (HCC) 02/09/2018   MRSA infection 02/09/2018   Fusion of lumbar spine 01/13/2018   Low back pain 12/23/2017   Tobacco use disorder 06/24/2016   Weight gain 01/02/2012   Stool incontinence 11/19/2011   Abdominal pain, right lower quadrant 11/19/2011   EUSTACHIAN TUBE DYSFUNCTION 08/10/2009   SINUSITIS, ACUTE 08/10/2009   TRANSIENT GLOBAL AMNESIA 01/12/2009   HIP PAIN, RIGHT 10/04/2008   HEPATITIS C 07/28/2008   COPD, 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   HYPERLIPIDEMIA, MIXED, WITH LOW HDL 08/30/2007   OTITIS EXTERNA, INFECTIVE NOS 08/27/2007   ROTATOR CUFF SYNDROME, RIGHT 08/27/2007   Chest pain at rest 08/27/2007   BURSITIS NOS 07/13/2007   MUSCULOSKELETAL PAIN 07/13/2007   CARPAL TUNNEL SYNDROME, BILATERAL 04/10/2007   Past Medical History:  Diagnosis  Date   Acute hepatitis C without mention of hepatic coma(070.51)    Anemia    Arthritis    back   Carpal tunnel syndrome    Chest pain, unspecified    Chronic airway obstruction, not elsewhere classified    Chronic hepatitis C without hepatic coma (HCC) 02/09/2018   Colitis    Coronary atherosclerosis of unspecified type of vessel, native or graft    Disorders of bursae and tendons in shoulder region, unspecified    Drug-induced skin rash 04/07/2018   GERD (gastroesophageal reflux disease)    Hardware complicating wound infection (HCC) 02/09/2018   Headache    migraines   Hepatitis    hx of hepatitis C   Infective otitis externa, unspecified    Mixed hyperlipidemia    MRSA infection 02/09/2018   Nonspecific abnormal results of thyroid function study    Other bursitis disorders    Other symptoms involving nervous and musculoskeletal systems(781.99)    Pneumonia    Seizures (HCC)    none for 4 years (as of 01/2018) "pseudo seizures"   Thrush 04/07/2018    Family History  Problem Relation Age of Onset   Crohn's disease Daughter 39       colon resection   Irritable bowel syndrome Daughter    Colonic polyp Mother    Thyroid disease Mother    COPD Mother    Uterine cancer Maternal Grandmother    Heart murmur Daughter        SVT   Colon cancer Neg Hx     Past Surgical History:  Procedure Laterality Date   ABDOMINAL HYSTERECTOMY     CARDIAC CATHETERIZATION     CARDIAC CATHETERIZATION N/A 06/25/2016   Procedure: Left Heart Cath and Coronary Angiography;  Surgeon: Orpah Cobb, MD;  Location: [facility];  Service: Cardiovascular;  Laterality: N/A;   CESAREAN SECTION     CHOLECYSTECTOMY     COLONOSCOPY     IR FLUORO GUIDED NEEDLE PLC ASPIRATION/INJECTION LOC  12/27/2017   LUMBAR FUSION  01/13/2018   Transforaminal lumbar interbody fusion L4-5 with screws, cages and rods, local bone graft, Allograft, Vivigen, Decompression L4-5 and L5-S1   TONSILLECTOMY     Social History    Occupational History   Occupation: care giver  Tobacco Use   Smoking status: Former    Types: Cigarettes    Quit date: 01/07/2018    Years since quitting: 4.0   Smokeless tobacco: Never   Tobacco comments:    2 cigarettes a day  Vaping Use   Vaping Use: Every  day  Substance and Sexual Activity   Alcohol use: No   Drug use: No   Sexual activity: Yes    Partners: Male

## 2022-02-18 ENCOUNTER — Other Ambulatory Visit: Payer: Self-pay | Admitting: Specialist

## 2022-02-18 MED ORDER — OXYCODONE HCL 5 MG PO TABS
5.0000 mg | ORAL_TABLET | ORAL | 0 refills | Status: DC | PRN
Start: 2022-02-18 — End: 2022-02-25

## 2022-02-18 NOTE — Telephone Encounter (Signed)
Patient called in and stated its time for her medication refill and needs approval for oxyCODONE (OXY IR/ROXICODONE) 5 MG immediate release tablet ?

## 2022-02-25 ENCOUNTER — Other Ambulatory Visit: Payer: Self-pay | Admitting: Specialist

## 2022-02-25 MED ORDER — OXYCODONE HCL 5 MG PO TABS: 5.0000 mg | ORAL_TABLET | ORAL | 0 refills | Status: DC | PRN

## 2022-02-25 NOTE — Addendum Note (Signed)
Addended by: Penne Lash, Otis Dials on: 02/25/2022 09:55 AM ? ? Modules accepted: Orders ? ?

## 2022-02-25 NOTE — Telephone Encounter (Signed)
Pt would like a refill on her pain medication.  ? ?CB [phone]  ?

## 2022-02-26 DIAGNOSIS — R0602 Shortness of breath: Secondary | ICD-10-CM | POA: Diagnosis not present

## 2022-02-26 DIAGNOSIS — R069 Unspecified abnormalities of breathing: Secondary | ICD-10-CM | POA: Diagnosis not present

## 2022-02-26 DIAGNOSIS — J80 Acute respiratory distress syndrome: Secondary | ICD-10-CM | POA: Diagnosis not present

## 2022-02-26 DIAGNOSIS — J441 Chronic obstructive pulmonary disease with (acute) exacerbation: Secondary | ICD-10-CM | POA: Diagnosis not present

## 2022-02-26 DIAGNOSIS — R0689 Other abnormalities of breathing: Secondary | ICD-10-CM | POA: Diagnosis not present

## 2022-02-26 DIAGNOSIS — I10 Essential (primary) hypertension: Secondary | ICD-10-CM | POA: Diagnosis not present

## 2022-02-28 NOTE — Progress Notes (Signed)
Surgical Instructions    Your procedure is scheduled on 03/12/22.  Report to [facility] Main Entrance "A" at 5:30 A.M., then check in with the Admitting office.  Call this number if you have problems the morning of surgery:  [phone]   If you have any questions prior to your surgery date call [phone]: Open Monday-Friday 8am-4pm    Remember:  Do not eat after midnight the night before your surgery  You may drink clear liquids until 4:30am the morning of your surgery.   Clear liquids allowed are: Water, Non-Citrus Juices (without pulp), Carbonated Beverages, Clear Tea, Black Coffee ONLY (NO MILK, CREAM OR POWDERED CREAMER of any kind), and Gatorade    Take these medicines the morning of surgery with A SIP OF WATER:   IF NEEDED: nitroGLYCERIN (NITROSTAT) oxyCODONE (OXY IR/ROXICODONE)   As of today, STOP taking any Aspirin (unless otherwise instructed by your surgeon) Aleve, Naproxen, Ibuprofen, Motrin, Advil, Goody's, BC's, all herbal medications, fish oil, and all vitamins.           Do not wear jewelry or makeup Do not wear lotions, powders, perfumes/colognes, or deodorant. Do not shave 48 hours prior to surgery.   Do not bring valuables to the hospital. Do not wear nail polish, gel polish, artificial nails, or any other type of covering on natural nails (fingers and toes) If you have artificial nails or gel coating that need to be removed by a nail salon, please have this removed prior to surgery. Artificial nails or gel coating may interfere with anesthesia's ability to adequately monitor your vital signs.  [facility] is not responsible for any belongings or valuables. .   Do NOT Smoke (Tobacco/Vaping)  24 hours prior to your procedure  If you use a CPAP at night, you may bring your mask for your overnight stay.   Contacts, glasses, hearing aids, dentures or partials may not be worn into surgery, please bring cases for these belongings   For patients admitted to the  hospital, discharge time will be determined by your treatment team.   Patients discharged the day of surgery will not be allowed to drive home, and someone needs to stay with them for 24 hours.   SURGICAL WAITING ROOM VISITATION Patients having surgery or a procedure in a hospital may have two support people. Children under the age of 58 must have an adult with them who is not the patient. They may stay in the waiting area during the procedure and may switch out with other visitors. If the patient needs to stay at the hospital during part of their recovery, the visitor guidelines for inpatient rooms apply.  Please refer to the [facility] website for the visitor guidelines for Inpatients (after your surgery is over and you are in a regular room).       Special instructions:    Oral Hygiene is also important to reduce your risk of infection.  Remember - BRUSH YOUR TEETH THE MORNING OF SURGERY WITH YOUR REGULAR TOOTHPASTE   [facility]- Preparing For Surgery  Before surgery, you can play an important role. Because skin is not sterile, your skin needs to be as free of germs as possible. You can reduce the number of germs on your skin by washing with CHG (chlorahexidine gluconate) Soap before surgery.  CHG is an antiseptic cleaner which kills germs and bonds with the skin to continue killing germs even after washing.     Please do not use if you have an allergy  to CHG or antibacterial soaps. If your skin becomes reddened/irritated stop using the CHG.  Do not shave (including legs and underarms) for at least 48 hours prior to first CHG shower. It is OK to shave your face.  Please follow these instructions carefully.     Shower the NIGHT BEFORE SURGERY and the MORNING OF SURGERY with CHG Soap.   If you chose to wash your hair, wash your hair first as usual with your normal shampoo. After you shampoo, rinse your hair and body thoroughly to remove the shampoo.  Then [employer] and genitals  (private parts) with your normal soap and rinse thoroughly to remove soap.  After that Use CHG Soap as you would any other liquid soap. You can apply CHG directly to the skin and wash gently with a scrungie or a clean washcloth.   Apply the CHG Soap to your body ONLY FROM THE NECK DOWN.  Do not use on open wounds or open sores. Avoid contact with your eyes, ears, mouth and genitals (private parts). Wash Face and genitals (private parts)  with your normal soap.   Wash thoroughly, paying special attention to the area where your surgery will be performed.  Thoroughly rinse your body with warm water from the neck down.  DO NOT shower/wash with your normal soap after using and rinsing off the CHG Soap.  Pat yourself dry with a CLEAN TOWEL.  Wear CLEAN PAJAMAS to bed the night before surgery  Place CLEAN SHEETS on your bed the night before your surgery  DO NOT SLEEP WITH PETS.   Day of Surgery: Take a shower with CHG soap. Wear Clean/Comfortable clothing the morning of surgery Do not apply any deodorants/lotions.   Remember to brush your teeth WITH YOUR REGULAR TOOTHPASTE.    If you received a COVID test during your pre-op visit, it is requested that you wear a mask when out in public, stay away from anyone that may not be feeling well, and notify your surgeon if you develop symptoms. If you have been in contact with anyone that has tested positive in the last 10 days, please notify your surgeon.    Please read over the following fact sheets that you were given.

## 2022-03-01 ENCOUNTER — Inpatient Hospital Stay (HOSPITAL_COMMUNITY)
Admission: RE | Admit: 2022-03-01 | Discharge: 2022-03-01 | Disposition: A | Discharge status: Home / Self Care | Payer: Medicare Other | Source: Ambulatory Visit

## 2022-03-04 ENCOUNTER — Telehealth: Payer: Self-pay | Admitting: Specialist

## 2022-03-04 NOTE — Telephone Encounter (Signed)
Patient called. Oxycodone is on back order at [facility] in [place]. Her call back number is [phone]

## 2022-03-05 ENCOUNTER — Inpatient Hospital Stay (HOSPITAL_COMMUNITY)
Admission: RE | Admit: 2022-03-05 | Discharge: 2022-03-05 | Disposition: A | Discharge status: Home / Self Care | Payer: Medicare Other | Source: Ambulatory Visit

## 2022-03-05 NOTE — Telephone Encounter (Signed)
I called and spoke with [facility] [employer] [facility] and advised that they need to let me know where they want the rx sent to.  Per Arnetha Courser, [facility] on [address] has it and she would like to have it sent there.--I advised that it may not get addressed till tomorrow.

## 2022-03-05 NOTE — Progress Notes (Shared)
Surgical Instructions    Your procedure is scheduled on 03/12/22.  Report to [facility] Main Entrance "A" at 5:30 A.M., then check in with the Admitting office.  Call this number if you have problems the morning of surgery:  [phone]   If you have any questions prior to your surgery date call [phone]: Open Monday-Friday 8am-4pm    Remember:  Do not eat after midnight the night before your surgery  You may drink clear liquids until 4:30am the morning of your surgery.   Clear liquids allowed are: Water, Non-Citrus Juices (without pulp), Carbonated Beverages, Clear Tea, Black Coffee ONLY (NO MILK, CREAM OR POWDERED CREAMER of any kind), and Gatorade    Take these medicines the morning of surgery with A SIP OF WATER:   IF NEEDED: nitroGLYCERIN (NITROSTAT) oxyCODONE (OXY IR/ROXICODONE)   As of today, STOP taking any Aspirin (unless otherwise instructed by your surgeon) Aleve, Naproxen, Ibuprofen, Motrin, Advil, Goody's, BC's, all herbal medications, fish oil, and all vitamins.           Do not wear jewelry or makeup Do not wear lotions, powders, perfumes/colognes, or deodorant. Do not shave 48 hours prior to surgery.   Do not bring valuables to the hospital. Do not wear nail polish, gel polish, artificial nails, or any other type of covering on natural nails (fingers and toes) If you have artificial nails or gel coating that need to be removed by a nail salon, please have this removed prior to surgery. Artificial nails or gel coating may interfere with anesthesia's ability to adequately monitor your vital signs.  [facility] is not responsible for any belongings or valuables. .   Do NOT Smoke (Tobacco/Vaping)  24 hours prior to your procedure  If you use a CPAP at night, you may bring your mask for your overnight stay.   Contacts, glasses, hearing aids, dentures or partials may not be worn into surgery, please bring cases for these belongings   For patients admitted to the  hospital, discharge time will be determined by your treatment team.   Patients discharged the day of surgery will not be allowed to drive home, and someone needs to stay with them for 24 hours.   SURGICAL WAITING ROOM VISITATION Patients having surgery or a procedure in a hospital may have two support people. Children under the age of 16 must have an adult with them who is not the patient. They may stay in the waiting area during the procedure and may switch out with other visitors. If the patient needs to stay at the hospital during part of their recovery, the visitor guidelines for inpatient rooms apply.  Please refer to the [facility] website for the visitor guidelines for Inpatients (after your surgery is over and you are in a regular room).       Special instructions:    Oral Hygiene is also important to reduce your risk of infection.  Remember - BRUSH YOUR TEETH THE MORNING OF SURGERY WITH YOUR REGULAR TOOTHPASTE   [facility]- Preparing For Surgery  Before surgery, you can play an important role. Because skin is not sterile, your skin needs to be as free of germs as possible. You can reduce the number of germs on your skin by washing with CHG (chlorahexidine gluconate) Soap before surgery.  CHG is an antiseptic cleaner which kills germs and bonds with the skin to continue killing germs even after washing.     Please do not use if you have an allergy   to CHG or antibacterial soaps. If your skin becomes reddened/irritated stop using the CHG.  Do not shave (including legs and underarms) for at least 48 hours prior to first CHG shower. It is OK to shave your face.  Please follow these instructions carefully.     Shower the NIGHT BEFORE SURGERY and the MORNING OF SURGERY with CHG Soap.   If you chose to wash your hair, wash your hair first as usual with your normal shampoo. After you shampoo, rinse your hair and body thoroughly to remove the shampoo.  Then Wash Face and genitals  (private parts) with your normal soap and rinse thoroughly to remove soap.  After that Use CHG Soap as you would any other liquid soap. You can apply CHG directly to the skin and wash gently with a scrungie or a clean washcloth.   Apply the CHG Soap to your body ONLY FROM THE NECK DOWN.  Do not use on open wounds or open sores. Avoid contact with your eyes, ears, mouth and genitals (private parts). Wash Face and genitals (private parts)  with your normal soap.   Wash thoroughly, paying special attention to the area where your surgery will be performed.  Thoroughly rinse your body with warm water from the neck down.  DO NOT shower/wash with your normal soap after using and rinsing off the CHG Soap.  Pat yourself dry with a CLEAN TOWEL.  Wear CLEAN PAJAMAS to bed the night before surgery  Place CLEAN SHEETS on your bed the night before your surgery  DO NOT SLEEP WITH PETS.   Day of Surgery: Take a shower with CHG soap. Wear Clean/Comfortable clothing the morning of surgery Do not apply any deodorants/lotions.   Remember to brush your teeth WITH YOUR REGULAR TOOTHPASTE.    If you received a COVID test during your pre-op visit, it is requested that you wear a mask when out in public, stay away from anyone that may not be feeling well, and notify your surgeon if you develop symptoms. If you have been in contact with anyone that has tested positive in the last 10 days, please notify your surgeon.    Please read over the following fact sheets that you were given.   

## 2022-03-05 NOTE — Pre-Procedure Instructions (Signed)
[name]  03/05/2022     Your procedure is scheduled on Tuesday, May 30.  Report to [facility] Admitting at 5:30  A.M.              Your surgery or procedure is scheduled to begin at 7:30 am  Call this number if you have problems the morning of surgery:  [phone] PRE- SURGERY DESK   Remember:  Do not eat or drink after midnight.  Take these medicines the morning of surgery with A SIP OF WATER:   IF NEEDED: oxyCODONE (OXY IR/ROXICODONE) nitroGLYCERIN (NITROSTAT)    [facility]- Preparing For Surgery  Before surgery, you can play an important role. Because skin is not sterile, your skin needs to be as free of germs as possible. You can reduce the number of germs on your skin by washing with CHG (chlorahexidine gluconate) Soap before surgery.  CHG is an antiseptic cleaner which kills germs and bonds with the skin to continue killing germs even after washing.    Oral Hygiene is also important to reduce your risk of infection.  Remember - BRUSH YOUR TEETH THE MORNING OF SURGERY WITH YOUR REGULAR TOOTHPASTE  Please do not use if you have an allergy to CHG or antibacterial soaps. If your skin becomes reddened/irritated stop using the CHG.  Do not shave (including legs and underarms) for at least 48 hours prior to first CHG shower. It is OK to shave your face.  Please follow these instructions carefully.   Shower the NIGHT BEFORE SURGERY and the MORNING OF SURGERY with CHG.   If you chose to wash your hair, wash your hair first as usual with your normal shampoo, rinse.  Then wash your face and private area with your home soap, then rinse.  After you shampoo, rinse your hair and body thoroughly to remove the shampoo.  Use CHG as you would any other liquid soap. You can apply CHG directly to the skin and wash gently with a scrungie or a clean washcloth.   Apply the CHG Soap to your body ONLY FROM THE NECK DOWN.  Do not use on open wounds or open sores. Avoid  contact with your eyes, ears, mouth and genitals (private parts)  Wash thoroughly, paying special attention to the area where your surgery will be performed.  Thoroughly rinse your body with warm water from the neck down.  DO NOT shower/wash with your normal soap after using and rinsing off the CHG Soap.  Pat yourself dry with a CLEAN TOWEL.  Wear CLEAN PAJAMAS to bed the night before surgery, wear comfortable clothes the morning of surgery  Place CLEAN SHEETS on your bed the night of your first shower and DO NOT SLEEP WITH PETS.    Day of Surgery: Shower as instructed above.  Dry off with a clean towel Do not wear lotions, powders, or perfumes, or deodorant. Please wear clean clothes to the hospital/surgery center.   Do not wear jewelry, make-up or nail polish. Remember to brush your teeth WITH YOUR REGULAR TOOTHPASTE.   Do not shave 48 hours prior to surgery.    Do not bring valuables to the hospital.  [facility] is not responsible for any belongings or valuables.  Contacts, dentures or bridgework may not be worn into surgery.  Leave your suitcase in the car.  After surgery it may be brought to your room.  For patients admitted to the hospital, discharge time will be determined by your  treatment team.  Patients discharged the day of surgery will not be allowed to drive home.   Please read over the  act sheets that you were given.

## 2022-03-06 ENCOUNTER — Telehealth: Payer: Self-pay | Admitting: Specialist

## 2022-03-06 ENCOUNTER — Other Ambulatory Visit: Payer: Self-pay | Admitting: Specialist

## 2022-03-06 MED ORDER — OXYCODONE HCL 5 MG PO TABS: 5.0000 mg | ORAL_TABLET | Freq: Four times a day (QID) | ORAL | 0 refills | Status: DC | PRN

## 2022-03-06 NOTE — Telephone Encounter (Signed)
Meds sent to pharm

## 2022-03-06 NOTE — Pre-Procedure Instructions (Signed)
Surgical Instructions    Your procedure is scheduled on Tuesday, May 30th.  Report to [facility] Main Entrance "A" at 05:30 A.M., then check in with the Admitting office.  Call this number if you have problems the morning of surgery:  [phone]   If you have any questions prior to your surgery date call [phone]: Open Monday-Friday 8am-4pm    Remember:  Do not eat after midnight the night before your surgery  You may drink clear liquids until 04:30 AM the morning of your surgery.   Clear liquids allowed are: Water, Non-Citrus Juices (without pulp), Carbonated Beverages, Clear Tea, Black Coffee Only (NO MILK, CREAM OR POWDERED CREAMER of any kind), and Gatorade.    Take these medicines the morning of surgery with A SIP OF WATER  oxyCODONE (OXY IR/ROXICODONE)- if needed  As of today, STOP taking any Aspirin (unless otherwise instructed by your surgeon) Aleve, Naproxen, Ibuprofen, Motrin, Advil, Goody's, BC's, all herbal medications, fish oil, and all vitamins.                     Do NOT Smoke (Tobacco/Vaping) for 24 hours prior to your procedure.  If you use a CPAP at night, you may bring your mask/headgear for your overnight stay.   Contacts, glasses, piercing's, hearing aid's, dentures or partials may not be worn into surgery, please bring cases for these belongings.    For patients admitted to the hospital, discharge time will be determined by your treatment team.   Patients discharged the day of surgery will not be allowed to drive home, and someone needs to stay with them for 24 hours.  SURGICAL WAITING ROOM VISITATION Patients having surgery or a procedure may have two support people in the waiting room. These visitors may be switched out with other visitors if needed. Children under the age of 55 must have an adult accompany them who is not the patient. If the patient needs to stay at the hospital during part of their recovery, the visitor guidelines for inpatient  rooms apply.  Please refer to the [facility] website for the visitor guidelines for Inpatients (after your surgery is over and you are in a regular room).    Special instructions:   [place]- Preparing For Surgery  Before surgery, you can play an important role. Because skin is not sterile, your skin needs to be as free of germs as possible. You can reduce the number of germs on your skin by washing with CHG (chlorahexidine gluconate) Soap before surgery.  CHG is an antiseptic cleaner which kills germs and bonds with the skin to continue killing germs even after washing.    Oral Hygiene is also important to reduce your risk of infection.  Remember - BRUSH YOUR TEETH THE MORNING OF SURGERY WITH YOUR REGULAR TOOTHPASTE  Please do not use if you have an allergy to CHG or antibacterial soaps. If your skin becomes reddened/irritated stop using the CHG.  Do not shave (including legs and underarms) for at least 48 hours prior to first CHG shower. It is OK to shave your face.  Please follow these instructions carefully.   Shower the NIGHT BEFORE SURGERY and the MORNING OF SURGERY  If you chose to wash your hair, wash your hair first as usual with your normal shampoo.  After you shampoo, rinse your hair and body thoroughly to remove the shampoo.  Use CHG Soap as you would any other liquid soap. You can apply CHG directly to the  skin and wash gently with a scrungie or a clean washcloth.   Apply the CHG Soap to your body ONLY FROM THE NECK DOWN.  Do not use on open wounds or open sores. Avoid contact with your eyes, ears, mouth and genitals (private parts). Wash Face and genitals (private parts)  with your normal soap.   Wash thoroughly, paying special attention to the area where your surgery will be performed.  Thoroughly rinse your body with warm water from the neck down.  DO NOT shower/wash with your normal soap after using and rinsing off the CHG Soap.  Pat yourself dry with a CLEAN  TOWEL.  Wear CLEAN PAJAMAS to bed the night before surgery  Place CLEAN SHEETS on your bed the night before your surgery  DO NOT SLEEP WITH PETS.   Day of Surgery: Take a shower with CHG soap. Do not wear jewelry or makeup Do not wear lotions, powders, perfumes, or deodorant. Do not shave 48 hours prior to surgery.   Do not bring valuables to the hospital.  [facility] is not responsible for any belongings or valuables. Do not wear nail polish, gel polish, artificial nails, or any other type of covering on natural nails (fingers and toes) If you have artificial nails or gel coating that need to be removed by a nail salon, please have this removed prior to surgery. Artificial nails or gel coating may interfere with anesthesia's ability to adequately monitor your vital signs. Wear Clean/Comfortable clothing the morning of surgery Remember to brush your teeth WITH YOUR REGULAR TOOTHPASTE.   Please read over the following fact sheets that you were given.    If you received a COVID test during your pre-op visit  it is requested that you wear a mask when out in public, stay away from anyone that may not be feeling well and notify your surgeon if you develop symptoms. If you have been in contact with anyone that has tested positive in the last 10 days please notify you surgeon.

## 2022-03-06 NOTE — Telephone Encounter (Signed)
Pt called with an FYI that [facility] resume supply of her oxycodone and dont need to be sent to [facility] anymore. Pt phone number is [phone]

## 2022-03-07 ENCOUNTER — Other Ambulatory Visit: Payer: Self-pay

## 2022-03-07 ENCOUNTER — Encounter (HOSPITAL_COMMUNITY)
Admission: RE | Admit: 2022-03-07 | Discharge: 2022-03-07 | Disposition: A | Discharge status: Home / Self Care | Payer: Medicare Other | Source: Ambulatory Visit | Attending: Specialist | Admitting: Specialist

## 2022-03-07 ENCOUNTER — Encounter (HOSPITAL_COMMUNITY): Payer: Self-pay

## 2022-03-07 VITALS — BP 166/74 | HR 65 | Temp 98.1°F | Resp 17 | Ht 61.0 in | Wt 151.9 lb

## 2022-03-07 DIAGNOSIS — J449 Chronic obstructive pulmonary disease, unspecified: Secondary | ICD-10-CM | POA: Diagnosis not present

## 2022-03-07 DIAGNOSIS — E785 Hyperlipidemia, unspecified: Secondary | ICD-10-CM | POA: Diagnosis not present

## 2022-03-07 DIAGNOSIS — Z87891 Personal history of nicotine dependence: Secondary | ICD-10-CM | POA: Diagnosis not present

## 2022-03-07 DIAGNOSIS — Z01818 Encounter for other preprocedural examination: Secondary | ICD-10-CM | POA: Diagnosis not present

## 2022-03-07 DIAGNOSIS — G43909 Migraine, unspecified, not intractable, without status migrainosus: Secondary | ICD-10-CM | POA: Insufficient documentation

## 2022-03-07 DIAGNOSIS — K219 Gastro-esophageal reflux disease without esophagitis: Secondary | ICD-10-CM | POA: Insufficient documentation

## 2022-03-07 DIAGNOSIS — M4316 Spondylolisthesis, lumbar region: Secondary | ICD-10-CM | POA: Insufficient documentation

## 2022-03-07 DIAGNOSIS — B1920 Unspecified viral hepatitis C without hepatic coma: Secondary | ICD-10-CM | POA: Insufficient documentation

## 2022-03-07 DIAGNOSIS — M47816 Spondylosis without myelopathy or radiculopathy, lumbar region: Secondary | ICD-10-CM | POA: Diagnosis not present

## 2022-03-07 DIAGNOSIS — M4135 Thoracogenic scoliosis, thoracolumbar region: Secondary | ICD-10-CM | POA: Diagnosis not present

## 2022-03-07 DIAGNOSIS — R569 Unspecified convulsions: Secondary | ICD-10-CM | POA: Insufficient documentation

## 2022-03-07 DIAGNOSIS — I2510 Atherosclerotic heart disease of native coronary artery without angina pectoris: Secondary | ICD-10-CM | POA: Insufficient documentation

## 2022-03-07 DIAGNOSIS — D649 Anemia, unspecified: Secondary | ICD-10-CM | POA: Insufficient documentation

## 2022-03-07 LAB — COMPREHENSIVE METABOLIC PANEL
ALT: 13 U/L (ref 0–44)
AST: 18 U/L (ref 15–41)
Albumin: 3.4 g/dL — ABNORMAL LOW (ref 3.5–5.0)
Alkaline Phosphatase: 65 U/L (ref 38–126)
Anion gap: 7 (ref 5–15)
BUN: 20 mg/dL (ref 8–23)
CO2: 26 mmol/L (ref 22–32)
Calcium: 8.8 mg/dL — ABNORMAL LOW (ref 8.9–10.3)
Chloride: 106 mmol/L (ref 98–111)
Creatinine, Ser: 0.71 mg/dL (ref 0.44–1.00)
GFR, Estimated: >60 mL/min (ref >60)
Glucose, Bld: 93 mg/dL (ref 70–99)
Potassium: 5.1 mmol/L (ref 3.5–5.1)
Sodium: 139 mmol/L (ref 135–145)
Total Bilirubin: 0.4 mg/dL (ref 0.3–1.2)
Total Protein: 6.1 g/dL — ABNORMAL LOW (ref 6.5–8.1)

## 2022-03-07 LAB — CBC
HCT: 39.7 % (ref 36.0–46.0)
Hemoglobin: 12.4 g/dL (ref 12.0–15.0)
MCH: 29 pg (ref 26.0–34.0)
MCHC: 31.2 g/dL (ref 30.0–36.0)
MCV: 92.8 fL (ref 80.0–100.0)
Platelets: 318 10*3/uL (ref 150–400)
RBC: 4.28 MIL/uL (ref 3.87–5.11)
RDW: 15.5 % (ref 11.5–15.5)
WBC: 8.6 10*3/uL (ref 4.0–10.5)
nRBC: 0 % (ref 0.0–0.2)

## 2022-03-07 LAB — TYPE AND SCREEN
ABO/RH(D): A POS
Antibody Screen: NEGATIVE

## 2022-03-07 LAB — SURGICAL PCR SCREEN
MRSA, PCR: POSITIVE — AB
Staphylococcus aureus: POSITIVE — AB

## 2022-03-07 NOTE — Progress Notes (Signed)
PCP - Dr. Algie Coffer, per pt Cardiologist - Orpah Cobb   Chest x-ray - n/a EKG - 03/07/22 Stress Test - 11/26/18 ECHO - 07/13/08 Cardiac Cath - 06/25/2016  ERAS Protcol - yes, no drink ordered or given  Anesthesia review: yes, waiting on cardiac clearance from Dr. Algie Coffer.  Clearance requested.   Patient denies shortness of breath, fever, cough and chest pain at PAT appointment   All instructions explained to the patient, with a verbal understanding of the material. Patient agrees to go over the instructions while at home for a better understanding. Patient also instructed to self quarantine after being tested for COVID-19. The opportunity to ask questions was provided.

## 2022-03-08 ENCOUNTER — Other Ambulatory Visit: Payer: Self-pay

## 2022-03-08 NOTE — Anesthesia Preprocedure Evaluation (Signed)
Anesthesia Evaluation  Patient identified by MRN, date of birth, ID band Patient awake    Reviewed: Allergy & Precautions, NPO status , Patient's Chart, lab work & pertinent test results  Airway Mallampati: III  TM Distance: >3 FB Neck ROM: Full    Dental  (+) Dental Advisory Given, Edentulous Upper, Edentulous Lower   Pulmonary COPD,  COPD inhaler, former smoker,  Quit smoking 02/23/22 Albuterol last used last night. Has needed inhaler 2-3x/d   Pulmonary exam normal breath sounds clear to auscultation       Cardiovascular hypertension (no meds- 136/70 in preop, has been off meds for 7 years now), + CAD (non-obstructive on cath 2017)  Normal cardiovascular exam Rhythm:Regular Rate:Normal  2020 neg stress test    Neuro/Psych  Headaches, Seizures - (hx pseudoseizures), Well Controlled,  negative psych ROS   GI/Hepatic GERD  Controlled,(+) Hepatitis - (treated 3-4 years ago), C  Endo/Other  negative endocrine ROS  Renal/GU negative Renal ROS  negative genitourinary   Musculoskeletal  (+) Arthritis , Osteoarthritis,  Has been taking oxy 5mg  5-6x/d for about 5 months now   Abdominal   Peds  Hematology negative hematology ROS (+) Hb 12.4   Anesthesia Other Findings   Reproductive/Obstetrics negative OB ROS                           Anesthesia Physical Anesthesia Plan  ASA: 3  Anesthesia Plan: General   Post-op Pain Management: Tylenol PO (pre-op)* and Dilaudid IV   Induction: Intravenous  PONV Risk Score and Plan: 3 and Ondansetron, Dexamethasone, Midazolam and Treatment may vary due to age or medical condition  Airway Management Planned: Oral ETT  Additional Equipment: Arterial line  Intra-op Plan:   Post-operative Plan: Extubation in OR  Informed Consent: I have reviewed the patients History and Physical, chart, labs and discussed the procedure including the risks, benefits and  alternatives for the proposed anesthesia with the patient or authorized representative who has indicated his/her understanding and acceptance.     Dental advisory given  Plan Discussed with: CRNA  Anesthesia Plan Comments: (Case booked for 7h- arterial line for BP monitoring )     Anesthesia Quick Evaluation

## 2022-03-08 NOTE — Progress Notes (Signed)
Anesthesia Chart Review:  Case: [id number] Date/Time: 03/12/22 0715   Procedure: LEFT L2-3 AND L3-4 TRANSFORAMINAL LUMBAR INTERBODY FUSION WITH PEDICLE SCREWS, RODS AND CAGES, REPLACEMENT OF RODS L4-5 WITH EXTENSION TO L2-3, LOCAL AND ALLOGRAFT BONE GRAFT, VIVIGEN   Anesthesia type: General   Pre-op diagnosis: Lumbar spondylosis  L2-3 and L3-4 with lumbar spondylolisthesis L2-3 above previous fusion L4-5, thoracolumbar scoliosis   Location: [facility] OR ROOM 05 / [facility]   Surgeons: Jessy Oto, MD       DISCUSSION: Patient is a 66 year old female scheduled for the above procedure.  History includes former smoker (quit 01/07/18), COPD, CAD (mild non-obstructive 2017), HLD, GERD, anemia, pseudoseizures, hepatitis C, migraines.   Low risk stress test 11/26/18 for chest pain. Awaiting last records from Dr. Doylene Canard (requested 03/07/22, but office closed 03/08/22), but on 01/12/22 he cleared her from both a medical and cardiac standpoint.  Anesthesia team to evaluate on the day of surgery.   VS: BP (!) 166/74   Pulse 65   Temp 36.7 C (Oral)   Resp 17   Ht '5\' 1"'  (1.549 m)   Wt 68.9 kg   SpO2 98%   BMI 28.70 kg/m    PROVIDERS: Dixie Dials, MD is PCP/cardiologist   LABS: Labs reviewed: Acceptable for surgery. (all labs ordered are listed, but only abnormal results are displayed)  Labs Reviewed  SURGICAL PCR SCREEN - Abnormal; Notable for the following components:      Result Value   MRSA, PCR POSITIVE (*)    Staphylococcus aureus POSITIVE (*)    All other components within normal limits  COMPREHENSIVE METABOLIC PANEL - Abnormal; Notable for the following components:   Calcium 8.8 (*)    Total Protein 6.1 (*)    Albumin 3.4 (*)    All other components within normal limits  CBC  TYPE AND SCREEN     IMAGES: MRI L-spine 01/28/22: IMPRESSION: 1. Slight interval progression of spondylosis at L3-L4 with moderate right and mild-moderate left foraminal stenosis. 2. Otherwise stable  degenerative changes of the lumbar spine with moderate-severe left foraminal stenosis at L2-L3 and moderate right and mild-moderate left foraminal stenosis at L3-L4. 3. No significant canal stenosis at any level.   EKG: 03/07/22: Sinus bradycardia at 51 bpm Moderate voltage criteria for LVH, may be normal variant ( R in aVL , Cornell product ) Abnormal ekg When compared with ECG of 09-Feb-2018 15:41, LVH criteria now met Confirmed by Gwyndolyn Kaufman [phone]) on 03/07/2022 3:59:29 PM   CV: Nuclear stress test 11/26/18: IMPRESSION: 1. No reversible ischemia or infarction. 2. Normal left ventricular wall motion. 3. Left ventricular ejection fraction is 62%. 4. Non invasive risk stratification*: Low *2012 Appropriate Use Criteria for Coronary Revascularization Focused Update: J Am Coll Cardiol. 7782;42(3):536-144. http://content.airportbarriers.com.aspx?articleid=1201161    Cardiac cath 06/25/16: Mid LAD to Dist LAD lesion, 30 %stenosed. Ost Cx to Prox Cx lesion, 15 %stenosed. Prox RCA lesion, 20 %stenosed. The left ventricular systolic function is normal. LV end diastolic pressure is normal.  Medical treatment.   Echo 07/13/08: SUMMARY   -  Overall left ventricular systolic function was normal. Left         ventricular ejection fraction was estimated to be 65 %. There         were no left ventricular regional wall motion abnormalities.         Left ventricular wall thickness was mildly increased.     Past Medical History:  Diagnosis Date   Acute hepatitis C without  mention of hepatic coma(070.51)    Anemia    Arthritis    back   Carpal tunnel syndrome    Chest pain, unspecified    Chronic airway obstruction, not elsewhere classified    Chronic hepatitis C without hepatic coma ([place]) 02/09/2018   Colitis    Coronary atherosclerosis of unspecified type of vessel, native or graft    Disorders of bursae and tendons in shoulder region, unspecified    Drug-induced skin  rash 04/07/2018   GERD (gastroesophageal reflux disease)    Hardware complicating wound infection ([place]) 02/09/2018   Headache    migraines   Hepatitis    hx of hepatitis C   Infective otitis externa, unspecified    Mixed hyperlipidemia    MRSA infection 02/09/2018   Nonspecific abnormal results of thyroid function study    Other bursitis disorders    Other symptoms involving nervous and musculoskeletal systems(781.99)    Pneumonia    Seizures ([place])    none for 4 years (as of 01/2018) "pseudo seizures"   Thrush 04/07/2018    Past Surgical History:  Procedure Laterality Date   ABDOMINAL HYSTERECTOMY     CARDIAC CATHETERIZATION     CARDIAC CATHETERIZATION N/A 06/25/2016   Procedure: Left Heart Cath and Coronary Angiography;  Surgeon: Dixie Dials, MD;  Location: [facility];  Service: Cardiovascular;  Laterality: N/A;   CESAREAN SECTION     CHOLECYSTECTOMY     COLONOSCOPY     IR FLUORO GUIDED NEEDLE PLC ASPIRATION/INJECTION LOC  12/27/2017   LUMBAR FUSION  01/13/2018   Transforaminal lumbar interbody fusion L4-5 with screws, cages and rods, local bone graft, Allograft, Vivigen, Decompression L4-5 and L5-S1   TONSILLECTOMY      MEDICATIONS:  amLODipine (NORVASC) 2.5 MG tablet   aspirin EC 81 MG EC tablet   Aspirin-Caffeine 845-65 MG PACK   atorvastatin (LIPITOR) 40 MG tablet   gabapentin (NEURONTIN) 100 MG capsule   HYDROcodone-acetaminophen (NORCO) 10-325 MG tablet   methylPREDNISolone (MEDROL DOSEPAK) 4 MG TBPK tablet   metoprolol tartrate (LOPRESSOR) 25 MG tablet   nitroGLYCERIN (NITROSTAT) 0.4 MG SL tablet   oxyCODONE (OXY IR/ROXICODONE) 5 MG immediate release tablet   traMADol (ULTRAM) 50 MG tablet   No current facility-administered medications for this encounter.    Myra Gianotti, PA-C Surgical Short Stay/Anesthesiology [facility] Phone [phone] [facility] Phone [phone] 03/08/2022 11:23 AM

## 2022-03-12 ENCOUNTER — Ambulatory Visit (HOSPITAL_COMMUNITY): Payer: Medicare Other | Admitting: Vascular Surgery

## 2022-03-12 ENCOUNTER — Inpatient Hospital Stay (HOSPITAL_COMMUNITY)
Admission: RE | Admit: 2022-03-12 | Discharge: 2022-03-15 | DRG: 455 | Disposition: A | Discharge status: Home Health | Payer: Medicare Other | Attending: Specialist | Admitting: Specialist

## 2022-03-12 ENCOUNTER — Encounter (HOSPITAL_COMMUNITY)
Admission: RE | Disposition: A | Discharge status: Home Health | Payer: Self-pay | Source: Home / Self Care | Attending: Specialist

## 2022-03-12 ENCOUNTER — Other Ambulatory Visit: Payer: Self-pay

## 2022-03-12 ENCOUNTER — Encounter (HOSPITAL_COMMUNITY): Payer: Self-pay | Admitting: Specialist

## 2022-03-12 ENCOUNTER — Ambulatory Visit (HOSPITAL_BASED_OUTPATIENT_CLINIC_OR_DEPARTMENT_OTHER): Payer: Medicare Other | Admitting: Anesthesiology

## 2022-03-12 ENCOUNTER — Ambulatory Visit (HOSPITAL_COMMUNITY): Payer: Medicare Other

## 2022-03-12 DIAGNOSIS — M4626 Osteomyelitis of vertebra, lumbar region: Secondary | ICD-10-CM | POA: Diagnosis not present

## 2022-03-12 DIAGNOSIS — M4316 Spondylolisthesis, lumbar region: Secondary | ICD-10-CM

## 2022-03-12 DIAGNOSIS — M47816 Spondylosis without myelopathy or radiculopathy, lumbar region: Secondary | ICD-10-CM

## 2022-03-12 DIAGNOSIS — Z885 Allergy status to narcotic agent status: Secondary | ICD-10-CM

## 2022-03-12 DIAGNOSIS — Z981 Arthrodesis status: Secondary | ICD-10-CM

## 2022-03-12 DIAGNOSIS — E782 Mixed hyperlipidemia: Secondary | ICD-10-CM | POA: Diagnosis present

## 2022-03-12 DIAGNOSIS — Z881 Allergy status to other antibiotic agents status: Secondary | ICD-10-CM | POA: Diagnosis not present

## 2022-03-12 DIAGNOSIS — Z79899 Other long term (current) drug therapy: Secondary | ICD-10-CM

## 2022-03-12 DIAGNOSIS — J449 Chronic obstructive pulmonary disease, unspecified: Secondary | ICD-10-CM | POA: Diagnosis not present

## 2022-03-12 DIAGNOSIS — M48062 Spinal stenosis, lumbar region with neurogenic claudication: Secondary | ICD-10-CM | POA: Diagnosis not present

## 2022-03-12 DIAGNOSIS — M47896 Other spondylosis, lumbar region: Secondary | ICD-10-CM | POA: Diagnosis not present

## 2022-03-12 DIAGNOSIS — Z87891 Personal history of nicotine dependence: Secondary | ICD-10-CM | POA: Diagnosis not present

## 2022-03-12 DIAGNOSIS — M4155 Other secondary scoliosis, thoracolumbar region: Secondary | ICD-10-CM | POA: Diagnosis not present

## 2022-03-12 DIAGNOSIS — Z888 Allergy status to other drugs, medicaments and biological substances status: Secondary | ICD-10-CM | POA: Diagnosis not present

## 2022-03-12 DIAGNOSIS — M4326 Fusion of spine, lumbar region: Secondary | ICD-10-CM

## 2022-03-12 DIAGNOSIS — Z7982 Long term (current) use of aspirin: Secondary | ICD-10-CM | POA: Diagnosis not present

## 2022-03-12 DIAGNOSIS — M4185 Other forms of scoliosis, thoracolumbar region: Secondary | ICD-10-CM | POA: Diagnosis not present

## 2022-03-12 DIAGNOSIS — B1710 Acute hepatitis C without hepatic coma: Secondary | ICD-10-CM | POA: Diagnosis not present

## 2022-03-12 DIAGNOSIS — M4156 Other secondary scoliosis, lumbar region: Secondary | ICD-10-CM | POA: Diagnosis present

## 2022-03-12 DIAGNOSIS — R269 Unspecified abnormalities of gait and mobility: Secondary | ICD-10-CM | POA: Diagnosis not present

## 2022-03-12 SURGERY — POSTERIOR LUMBAR FUSION 2 LEVEL
Anesthesia: General | Site: Back

## 2022-03-12 MED ORDER — GABAPENTIN 100 MG PO CAPS
100.0000 mg | ORAL_CAPSULE | Freq: Every day | ORAL | Status: DC
  Administered 2022-03-12 – 2022-03-13 (×2): 100 mg via ORAL
  Filled 2022-03-12 (×2): qty 1

## 2022-03-12 MED ORDER — HYDROMORPHONE HCL 1 MG/ML IJ SOLN
0.2500 mg | INTRAMUSCULAR | Status: DC | PRN
  Administered 2022-03-12: 0.5 mg via INTRAVENOUS

## 2022-03-12 MED ORDER — BUPIVACAINE LIPOSOME 1.3 % IJ SUSP
INTRAMUSCULAR | Status: DC | PRN
  Administered 2022-03-12: 20 mL

## 2022-03-12 MED ORDER — KETAMINE HCL 10 MG/ML IJ SOLN
INTRAMUSCULAR | Status: DC | PRN
  Administered 2022-03-12: 10 mg via INTRAVENOUS
  Administered 2022-03-12: 30 mg via INTRAVENOUS
  Administered 2022-03-12 (×4): 10 mg via INTRAVENOUS
  Administered 2022-03-12: 20 mg via INTRAVENOUS

## 2022-03-12 MED ORDER — PROPOFOL 10 MG/ML IV BOLUS
INTRAVENOUS | Status: AC
  Filled 2022-03-12: qty 20

## 2022-03-12 MED ORDER — OXYCODONE HCL 5 MG PO TABS
5.0000 mg | ORAL_TABLET | ORAL | Status: DC | PRN
  Administered 2022-03-14 – 2022-03-15 (×3): 5 mg via ORAL
  Filled 2022-03-12 (×3): qty 1

## 2022-03-12 MED ORDER — FLEET ENEMA 7-19 GM/118ML RE ENEM: 1.0000 | ENEMA | Freq: Once | RECTAL | Status: DC | PRN

## 2022-03-12 MED ORDER — 0.9 % SODIUM CHLORIDE (POUR BTL) OPTIME
TOPICAL | Status: DC | PRN
  Administered 2022-03-12: 1000 mL

## 2022-03-12 MED ORDER — AMLODIPINE BESYLATE 2.5 MG PO TABS
2.5000 mg | ORAL_TABLET | Freq: Every day | ORAL | Status: DC
  Administered 2022-03-12 – 2022-03-14 (×3): 2.5 mg via ORAL
  Filled 2022-03-12 (×4): qty 1

## 2022-03-12 MED ORDER — THROMBIN 20000 UNITS EX SOLR: CUTANEOUS | Status: DC | PRN

## 2022-03-12 MED ORDER — ACETAMINOPHEN 650 MG RE SUPP: 650.0000 mg | RECTAL | Status: DC | PRN

## 2022-03-12 MED ORDER — KETAMINE HCL 50 MG/5ML IJ SOSY
PREFILLED_SYRINGE | INTRAMUSCULAR | Status: AC
  Filled 2022-03-12: qty 10

## 2022-03-12 MED ORDER — VANCOMYCIN HCL IN DEXTROSE 1-5 GM/200ML-% IV SOLN: 1000.0000 mg | Freq: Once | INTRAVENOUS | Status: AC

## 2022-03-12 MED ORDER — LACTATED RINGERS IV SOLN: INTRAVENOUS | Status: DC

## 2022-03-12 MED ORDER — PHENYLEPHRINE HCL-NACL 20-0.9 MG/250ML-% IV SOLN
INTRAVENOUS | Status: DC | PRN
  Administered 2022-03-12: 30 ug/min via INTRAVENOUS

## 2022-03-12 MED ORDER — MORPHINE SULFATE (PF) 2 MG/ML IV SOLN
1.0000 mg | INTRAVENOUS | Status: DC | PRN
  Administered 2022-03-12 – 2022-03-13 (×7): 1 mg via INTRAVENOUS
  Filled 2022-03-12 (×7): qty 1

## 2022-03-12 MED ORDER — OXYCODONE HCL ER 10 MG PO T12A
20.0000 mg | EXTENDED_RELEASE_TABLET | Freq: Two times a day (BID) | ORAL | Status: DC
  Administered 2022-03-12 – 2022-03-13 (×2): 20 mg via ORAL
  Filled 2022-03-12 (×2): qty 2

## 2022-03-12 MED ORDER — HYDROMORPHONE HCL 1 MG/ML IJ SOLN
INTRAMUSCULAR | Status: AC
  Filled 2022-03-12: qty 0.5

## 2022-03-12 MED ORDER — DOCUSATE SODIUM 100 MG PO CAPS
100.0000 mg | ORAL_CAPSULE | Freq: Two times a day (BID) | ORAL | Status: DC
  Administered 2022-03-12 – 2022-03-14 (×5): 100 mg via ORAL
  Filled 2022-03-12 (×6): qty 1

## 2022-03-12 MED ORDER — PANTOPRAZOLE SODIUM 40 MG IV SOLR
40.0000 mg | Freq: Every day | INTRAVENOUS | Status: DC
  Administered 2022-03-12: 40 mg via INTRAVENOUS
  Filled 2022-03-12: qty 10

## 2022-03-12 MED ORDER — AMISULPRIDE (ANTIEMETIC) 5 MG/2ML IV SOLN: 10.0000 mg | Freq: Once | INTRAVENOUS | Status: DC | PRN

## 2022-03-12 MED ORDER — ONDANSETRON HCL 4 MG/2ML IJ SOLN
INTRAMUSCULAR | Status: AC
  Filled 2022-03-12: qty 2

## 2022-03-12 MED ORDER — LORAZEPAM 1 MG PO TABS
1.0000 mg | ORAL_TABLET | ORAL | Status: DC | PRN
  Administered 2022-03-12 – 2022-03-14 (×7): 1 mg via ORAL
  Filled 2022-03-12 (×7): qty 1

## 2022-03-12 MED ORDER — HYDROMORPHONE HCL 1 MG/ML IJ SOLN
INTRAMUSCULAR | Status: DC | PRN
  Administered 2022-03-12 (×4): .5 mg via INTRAVENOUS

## 2022-03-12 MED ORDER — ACETAMINOPHEN 10 MG/ML IV SOLN
INTRAVENOUS | Status: DC | PRN
  Administered 2022-03-12: 1000 mg via INTRAVENOUS

## 2022-03-12 MED ORDER — BISACODYL 5 MG PO TBEC: 5.0000 mg | DELAYED_RELEASE_TABLET | Freq: Every day | ORAL | Status: DC | PRN

## 2022-03-12 MED ORDER — BUPIVACAINE HCL 0.5 % IJ SOLN
INTRAMUSCULAR | Status: DC | PRN
  Administered 2022-03-12: 20 mL

## 2022-03-12 MED ORDER — SODIUM CHLORIDE 0.9% FLUSH
3.0000 mL | Freq: Two times a day (BID) | INTRAVENOUS | Status: DC
  Administered 2022-03-12 – 2022-03-14 (×4): 3 mL via INTRAVENOUS

## 2022-03-12 MED ORDER — MENTHOL 3 MG MT LOZG: 1.0000 | LOZENGE | OROMUCOSAL | Status: DC | PRN

## 2022-03-12 MED ORDER — METHOCARBAMOL 500 MG PO TABS
500.0000 mg | ORAL_TABLET | Freq: Four times a day (QID) | ORAL | Status: DC | PRN
  Administered 2022-03-12 – 2022-03-15 (×8): 500 mg via ORAL
  Filled 2022-03-12 (×7): qty 1

## 2022-03-12 MED ORDER — NITROGLYCERIN 0.4 MG SL SUBL: 0.4000 mg | SUBLINGUAL_TABLET | SUBLINGUAL | Status: DC | PRN

## 2022-03-12 MED ORDER — ONDANSETRON HCL 4 MG/2ML IJ SOLN: 4.0000 mg | Freq: Four times a day (QID) | INTRAMUSCULAR | Status: DC | PRN

## 2022-03-12 MED ORDER — ACETAMINOPHEN 10 MG/ML IV SOLN
INTRAVENOUS | Status: AC
  Filled 2022-03-12: qty 100

## 2022-03-12 MED ORDER — TRANEXAMIC ACID-NACL 1000-0.7 MG/100ML-% IV SOLN
1000.0000 mg | INTRAVENOUS | Status: AC
  Administered 2022-03-12: 1000 mg via INTRAVENOUS
  Filled 2022-03-12: qty 100

## 2022-03-12 MED ORDER — BUPIVACAINE HCL (PF) 0.5 % IJ SOLN
INTRAMUSCULAR | Status: AC
  Filled 2022-03-12: qty 30

## 2022-03-12 MED ORDER — BUPIVACAINE LIPOSOME 1.3 % IJ SUSP
INTRAMUSCULAR | Status: AC
  Filled 2022-03-12: qty 20

## 2022-03-12 MED ORDER — HYDROMORPHONE HCL 1 MG/ML IJ SOLN
INTRAMUSCULAR | Status: AC
  Administered 2022-03-12: 0.5 mg via INTRAVENOUS
  Filled 2022-03-12: qty 1

## 2022-03-12 MED ORDER — ROCURONIUM BROMIDE 10 MG/ML (PF) SYRINGE
PREFILLED_SYRINGE | INTRAVENOUS | Status: DC | PRN
  Administered 2022-03-12: 20 mg via INTRAVENOUS
  Administered 2022-03-12: 100 mg via INTRAVENOUS
  Administered 2022-03-12: 20 mg via INTRAVENOUS

## 2022-03-12 MED ORDER — ALBUTEROL SULFATE HFA 108 (90 BASE) MCG/ACT IN AERS
INHALATION_SPRAY | RESPIRATORY_TRACT | Status: DC | PRN
  Administered 2022-03-12: 2 via RESPIRATORY_TRACT

## 2022-03-12 MED ORDER — OXYCODONE HCL 5 MG/5ML PO SOLN: 5.0000 mg | Freq: Once | ORAL | Status: AC | PRN

## 2022-03-12 MED ORDER — ACETAMINOPHEN 500 MG PO TABS
1000.0000 mg | ORAL_TABLET | Freq: Once | ORAL | Status: AC
  Administered 2022-03-12: 1000 mg via ORAL
  Filled 2022-03-12: qty 2

## 2022-03-12 MED ORDER — OXYCODONE HCL 5 MG PO TABS
10.0000 mg | ORAL_TABLET | ORAL | Status: DC | PRN
  Administered 2022-03-12 – 2022-03-15 (×11): 10 mg via ORAL
  Filled 2022-03-12 (×13): qty 2

## 2022-03-12 MED ORDER — KETOROLAC TROMETHAMINE 30 MG/ML IJ SOLN
INTRAMUSCULAR | Status: AC
  Filled 2022-03-12: qty 1

## 2022-03-12 MED ORDER — CHLORHEXIDINE GLUCONATE 0.12 % MT SOLN
15.0000 mL | Freq: Once | OROMUCOSAL | Status: AC
  Administered 2022-03-12: 15 mL via OROMUCOSAL
  Filled 2022-03-12: qty 15

## 2022-03-12 MED ORDER — PROPOFOL 10 MG/ML IV BOLUS
INTRAVENOUS | Status: DC | PRN
  Administered 2022-03-12: 100 mg via INTRAVENOUS
  Administered 2022-03-12: 50 mg via INTRAVENOUS

## 2022-03-12 MED ORDER — OXYCODONE HCL 5 MG/5ML PO SOLN: 5.0000 mg | Freq: Once | ORAL | Status: DC | PRN

## 2022-03-12 MED ORDER — KETOROLAC TROMETHAMINE 30 MG/ML IJ SOLN
30.0000 mg | Freq: Once | INTRAMUSCULAR | Status: AC
Start: 2022-03-12 — End: 2022-03-12
  Administered 2022-03-12: 30 mg via INTRAVENOUS

## 2022-03-12 MED ORDER — OXYCODONE HCL 5 MG PO TABS: 5.0000 mg | ORAL_TABLET | Freq: Once | ORAL | Status: DC | PRN

## 2022-03-12 MED ORDER — THROMBIN 20000 UNITS EX SOLR
CUTANEOUS | Status: AC
  Filled 2022-03-12: qty 20000

## 2022-03-12 MED ORDER — FENTANYL CITRATE (PF) 250 MCG/5ML IJ SOLN
INTRAMUSCULAR | Status: AC
  Filled 2022-03-12: qty 5

## 2022-03-12 MED ORDER — LACTATED RINGERS IV SOLN: INTRAVENOUS | Status: DC | PRN

## 2022-03-12 MED ORDER — OXYCODONE HCL 5 MG PO TABS
10.0000 mg | ORAL_TABLET | Freq: Once | ORAL | Status: AC | PRN
  Administered 2022-03-12: 10 mg via ORAL

## 2022-03-12 MED ORDER — ALUM & MAG HYDROXIDE-SIMETH 200-200-20 MG/5ML PO SUSP: 30.0000 mL | Freq: Four times a day (QID) | ORAL | Status: DC | PRN

## 2022-03-12 MED ORDER — ACETAMINOPHEN 325 MG PO TABS
650.0000 mg | ORAL_TABLET | ORAL | Status: DC | PRN
  Administered 2022-03-13 – 2022-03-14 (×4): 650 mg via ORAL
  Filled 2022-03-12 (×4): qty 2

## 2022-03-12 MED ORDER — ORAL CARE MOUTH RINSE: 15.0000 mL | Freq: Once | OROMUCOSAL | Status: AC

## 2022-03-12 MED ORDER — METHOCARBAMOL 500 MG PO TABS
ORAL_TABLET | ORAL | Status: AC
  Filled 2022-03-12: qty 1

## 2022-03-12 MED ORDER — LIDOCAINE 2% (20 MG/ML) 5 ML SYRINGE
INTRAMUSCULAR | Status: DC | PRN
  Administered 2022-03-12: 60 mg via INTRAVENOUS

## 2022-03-12 MED ORDER — SODIUM CHLORIDE 0.9 % IV SOLN: INTRAVENOUS | Status: DC

## 2022-03-12 MED ORDER — ONDANSETRON HCL 4 MG PO TABS: 4.0000 mg | ORAL_TABLET | Freq: Four times a day (QID) | ORAL | Status: DC | PRN

## 2022-03-12 MED ORDER — ALBUMIN HUMAN 5 % IV SOLN: INTRAVENOUS | Status: DC | PRN

## 2022-03-12 MED ORDER — VANCOMYCIN HCL IN DEXTROSE 1-5 GM/200ML-% IV SOLN
INTRAVENOUS | Status: AC
  Administered 2022-03-12: 1000 mg via INTRAVENOUS
  Filled 2022-03-12: qty 200

## 2022-03-12 MED ORDER — MIDAZOLAM HCL 5 MG/5ML IJ SOLN
INTRAMUSCULAR | Status: DC | PRN
  Administered 2022-03-12: 2 mg via INTRAVENOUS

## 2022-03-12 MED ORDER — MIDAZOLAM HCL 2 MG/2ML IJ SOLN
INTRAMUSCULAR | Status: AC
  Filled 2022-03-12: qty 2

## 2022-03-12 MED ORDER — OXYCODONE HCL 5 MG PO TABS
ORAL_TABLET | ORAL | Status: AC
  Filled 2022-03-12: qty 2

## 2022-03-12 MED ORDER — POLYETHYLENE GLYCOL 3350 17 G PO PACK: 17.0000 g | PACK | Freq: Every day | ORAL | Status: DC | PRN

## 2022-03-12 MED ORDER — ACETAMINOPHEN 10 MG/ML IV SOLN
INTRAVENOUS | Status: AC
  Administered 2022-03-12: 1000 mg via INTRAVENOUS
  Filled 2022-03-12: qty 100

## 2022-03-12 MED ORDER — GABAPENTIN 100 MG PO CAPS
200.0000 mg | ORAL_CAPSULE | Freq: Two times a day (BID) | ORAL | Status: DC
  Administered 2022-03-12 – 2022-03-14 (×4): 200 mg via ORAL
  Filled 2022-03-12 (×4): qty 2

## 2022-03-12 MED ORDER — DEXAMETHASONE SODIUM PHOSPHATE 10 MG/ML IJ SOLN
INTRAMUSCULAR | Status: DC | PRN
  Administered 2022-03-12: 5 mg via INTRAVENOUS

## 2022-03-12 MED ORDER — ONDANSETRON HCL 4 MG/2ML IJ SOLN: 4.0000 mg | Freq: Once | INTRAMUSCULAR | Status: DC | PRN

## 2022-03-12 MED ORDER — FENTANYL CITRATE (PF) 250 MCG/5ML IJ SOLN
INTRAMUSCULAR | Status: DC | PRN
  Administered 2022-03-12: 100 ug via INTRAVENOUS
  Administered 2022-03-12 (×3): 50 ug via INTRAVENOUS

## 2022-03-12 MED ORDER — ONDANSETRON HCL 4 MG/2ML IJ SOLN
INTRAMUSCULAR | Status: DC | PRN
  Administered 2022-03-12: 4 mg via INTRAVENOUS

## 2022-03-12 MED ORDER — ASPIRIN-CAFFEINE 845-65 MG PO PACK: 1.0000 | PACK | Freq: Every day | ORAL | Status: DC | PRN

## 2022-03-12 MED ORDER — METOPROLOL TARTRATE 12.5 MG HALF TABLET
12.5000 mg | ORAL_TABLET | Freq: Two times a day (BID) | ORAL | Status: DC
  Administered 2022-03-12 – 2022-03-14 (×5): 12.5 mg via ORAL
  Filled 2022-03-12 (×6): qty 1

## 2022-03-12 MED ORDER — SODIUM CHLORIDE 0.9% FLUSH: 3.0000 mL | INTRAVENOUS | Status: DC | PRN

## 2022-03-12 MED ORDER — DEXAMETHASONE SODIUM PHOSPHATE 10 MG/ML IJ SOLN
INTRAMUSCULAR | Status: AC
  Filled 2022-03-12: qty 1

## 2022-03-12 MED ORDER — CEFAZOLIN SODIUM-DEXTROSE 2-4 GM/100ML-% IV SOLN
2.0000 g | Freq: Three times a day (TID) | INTRAVENOUS | Status: AC
  Administered 2022-03-12 – 2022-03-13 (×2): 2 g via INTRAVENOUS
  Filled 2022-03-12 (×2): qty 100

## 2022-03-12 MED ORDER — ASPIRIN 81 MG PO TBEC
81.0000 mg | DELAYED_RELEASE_TABLET | Freq: Every day | ORAL | Status: DC
  Administered 2022-03-12 – 2022-03-15 (×4): 81 mg via ORAL
  Filled 2022-03-12 (×4): qty 1

## 2022-03-12 MED ORDER — PHENOL 1.4 % MT LIQD: 1.0000 | OROMUCOSAL | Status: DC | PRN

## 2022-03-12 MED ORDER — CEFAZOLIN SODIUM-DEXTROSE 2-4 GM/100ML-% IV SOLN
2.0000 g | INTRAVENOUS | Status: AC
  Administered 2022-03-12 (×2): 2 g via INTRAVENOUS
  Filled 2022-03-12: qty 100

## 2022-03-12 MED ORDER — BUPIVACAINE LIPOSOME 1.3 % IJ SUSP
10.0000 mL | Freq: Once | INTRAMUSCULAR | Status: DC
  Filled 2022-03-12: qty 20

## 2022-03-12 MED ORDER — SODIUM CHLORIDE 0.9 % IV SOLN: 250.0000 mL | INTRAVENOUS | Status: DC

## 2022-03-12 MED ORDER — METHOCARBAMOL 1000 MG/10ML IJ SOLN: 500.0000 mg | Freq: Four times a day (QID) | INTRAVENOUS | Status: DC | PRN

## 2022-03-12 MED ORDER — ATORVASTATIN CALCIUM 40 MG PO TABS
40.0000 mg | ORAL_TABLET | Freq: Every day | ORAL | Status: DC
  Administered 2022-03-12 – 2022-03-14 (×3): 40 mg via ORAL
  Filled 2022-03-12 (×3): qty 1

## 2022-03-12 MED ORDER — PHENYLEPHRINE HCL (PRESSORS) 10 MG/ML IV SOLN
INTRAVENOUS | Status: DC | PRN
  Administered 2022-03-12 (×2): 80 ug via INTRAVENOUS

## 2022-03-12 MED ORDER — ACETAMINOPHEN 10 MG/ML IV SOLN: 1000.0000 mg | Freq: Once | INTRAVENOUS | Status: AC

## 2022-03-12 SURGICAL SUPPLY — 75 items
ADH SKN CLS APL DERMABOND .7 (GAUZE/BANDAGES/DRESSINGS) ×1
AGENT HMST KT MTR STRL THRMB (HEMOSTASIS) ×1
BAG COUNTER SPONGE SURGICOUNT (BAG) ×2 IMPLANT
BAG SPNG CNTER NS LX DISP (BAG) ×1
BLADE CLIPPER SURG (BLADE) IMPLANT
BUR MATCHSTICK NEURO 3.0 LAGG (BURR) ×2 IMPLANT
BUR RND FLUTED 2.5 (BURR) IMPLANT
BUR SABER RD CUTTING 3.0 (BURR) IMPLANT
CAGE LORD XPAC 10X25 (Cage) ×2 IMPLANT
CANNULA GRAFT BNE VG PRE-FILL (Bone Implant) IMPLANT
COVER BACK TABLE 80X110 HD (DRAPES) ×2 IMPLANT
COVER MAYO STAND STRL (DRAPES) ×2 IMPLANT
COVER SURGICAL LIGHT HANDLE (MISCELLANEOUS) ×2 IMPLANT
DERMABOND ADVANCED (GAUZE/BANDAGES/DRESSINGS) ×1
DERMABOND ADVANCED .7 DNX12 (GAUZE/BANDAGES/DRESSINGS) ×1 IMPLANT
DISPENSER GRAFT BNE VG (MISCELLANEOUS) IMPLANT
DISPENSER VIVIGEN BONE GRAFT (MISCELLANEOUS) ×2 IMPLANT
DRAPE C-ARM 42X72 X-RAY (DRAPES) ×4 IMPLANT
DRAPE C-ARMOR (DRAPES) ×2 IMPLANT
DRAPE MICROSCOPE LEICA (MISCELLANEOUS) ×2 IMPLANT
DRAPE SURG 17X23 STRL (DRAPES) ×8 IMPLANT
DRSG MEPILEX BORDER 4X4 (GAUZE/BANDAGES/DRESSINGS) IMPLANT
DRSG MEPILEX BORDER 4X8 (GAUZE/BANDAGES/DRESSINGS) ×1 IMPLANT
DURAPREP 26ML APPLICATOR (WOUND CARE) ×2 IMPLANT
ELECT BLADE 6.5 EXT (BLADE) IMPLANT
ELECT CAUTERY BLADE 6.4 (BLADE) ×2 IMPLANT
ELECT REM PT RETURN 9FT ADLT (ELECTROSURGICAL) ×2
ELECTRODE REM PT RTRN 9FT ADLT (ELECTROSURGICAL) ×1 IMPLANT
EVACUATOR 1/8 PVC DRAIN (DRAIN) IMPLANT
GLOVE BIOGEL PI IND STRL 8 (GLOVE) ×1 IMPLANT
GLOVE BIOGEL PI INDICATOR 8 (GLOVE) ×1
GLOVE ECLIPSE 9.0 STRL (GLOVE) ×2 IMPLANT
GLOVE ORTHO TXT STRL SZ7.5 (GLOVE) ×2 IMPLANT
GLOVE SURG 8.5 LATEX PF (GLOVE) ×2 IMPLANT
GOWN STRL REUS W/ TWL LRG LVL3 (GOWN DISPOSABLE) ×1 IMPLANT
GOWN STRL REUS W/TWL 2XL LVL3 (GOWN DISPOSABLE) ×4 IMPLANT
GOWN STRL REUS W/TWL LRG LVL3 (GOWN DISPOSABLE) ×2
GRAFT BONE CANNULA VIVIGEN 3 (Bone Implant) ×12 IMPLANT
KIT BASIN OR (CUSTOM PROCEDURE TRAY) ×2 IMPLANT
KIT POSITION SURG JACKSON T1 (MISCELLANEOUS) ×2 IMPLANT
KIT TURNOVER KIT B (KITS) ×2 IMPLANT
MANIFOLD NEPTUNE II (INSTRUMENTS) ×2 IMPLANT
NDL SPNL 18GX3.5 QUINCKE PK (NEEDLE) ×1 IMPLANT
NEEDLE 22X1 1/2 (OR ONLY) (NEEDLE) ×2 IMPLANT
NEEDLE SPNL 18GX3.5 QUINCKE PK (NEEDLE) ×2 IMPLANT
NS IRRIG 1000ML POUR BTL (IV SOLUTION) ×2 IMPLANT
PACK LAMINECTOMY ORTHO (CUSTOM PROCEDURE TRAY) ×2 IMPLANT
PAD ARMBOARD 7.5X6 YLW CONV (MISCELLANEOUS) ×4 IMPLANT
PATTIES SURGICAL .75X.75 (GAUZE/BANDAGES/DRESSINGS) IMPLANT
PATTIES SURGICAL 1X1 (DISPOSABLE) ×2 IMPLANT
ROD EXPEDIUM PREBENT 85MM (Rod) ×2 IMPLANT
SCREW CORT FIX FEN 5.5X6X45MM (Screw) ×4 IMPLANT
SCREW SET SINGLE INNER (Screw) ×8 IMPLANT
SCREW VIPER 7X45MM (Screw) ×1 IMPLANT
SPONGE SURGIFOAM ABS GEL 100 (HEMOSTASIS) ×2 IMPLANT
SPONGE T-LAP 4X18 ~~LOC~~+RFID (SPONGE) IMPLANT
STAPLER VISISTAT 35W (STAPLE) ×1 IMPLANT
SURGIFLO W/THROMBIN 8M KIT (HEMOSTASIS) ×1 IMPLANT
SUT VIC AB 0 CT1 27 (SUTURE) ×4
SUT VIC AB 0 CT1 27XBRD ANBCTR (SUTURE) ×1 IMPLANT
SUT VIC AB 1 CTX 36 (SUTURE) ×4
SUT VIC AB 1 CTX36XBRD ANBCTR (SUTURE) ×2 IMPLANT
SUT VIC AB 2-0 CT1 27 (SUTURE) ×2
SUT VIC AB 2-0 CT1 TAPERPNT 27 (SUTURE) ×1 IMPLANT
SUT VIC AB 3-0 X1 27 (SUTURE) ×2 IMPLANT
SYR 20ML LL LF (SYRINGE) ×2 IMPLANT
SYR CONTROL 10ML LL (SYRINGE) ×4 IMPLANT
TAP CANN VIPER2 DL 5.0 (TAP) ×1 IMPLANT
TAP CANN VIPER2 DL 6.0 (TAP) ×1 IMPLANT
TAP VIPER MIS 4.35MM (TAP) ×1 IMPLANT
TOWEL GREEN STERILE (TOWEL DISPOSABLE) ×2 IMPLANT
TOWEL GREEN STERILE FF (TOWEL DISPOSABLE) ×2 IMPLANT
TRAY FOLEY MTR SLVR 16FR STAT (SET/KITS/TRAYS/PACK) ×2 IMPLANT
WATER STERILE IRR 1000ML POUR (IV SOLUTION) ×2 IMPLANT
YANKAUER SUCT BULB TIP NO VENT (SUCTIONS) ×2 IMPLANT

## 2022-03-12 NOTE — H&P (Signed)
PREOPERATIVE H&P  Chief Complaint: Lumbar spondylosis  L2-3 and L3-4 with lumbar spondylolisthesis L2-3 above previous fusion L4-5, thoracolumbar scoliosis  HPI: [name] is a 66 y.o. female who presents for preoperative history and physical with a diagnosis of Lumbar spondylosis  L2-3 and L3-4 with lumbar spondylolisthesis L2-3 above previous fusion L4-5, thoracolumbar scoliosis. Symptoms are rated as moderate to severe, and have been worsening.  This is significantly impairing activities of daily living.  She has elected for surgical management.   Past Medical History:  Diagnosis Date   Acute hepatitis C without mention of hepatic coma(070.51)    Anemia    Arthritis    back   Carpal tunnel syndrome    Chest pain, unspecified    Chronic airway obstruction, not elsewhere classified    Chronic hepatitis C without hepatic coma ([place]) 02/09/2018   Colitis    Coronary atherosclerosis of unspecified type of vessel, native or graft    Disorders of bursae and tendons in shoulder region, unspecified    Drug-induced skin rash 04/07/2018   GERD (gastroesophageal reflux disease)    Hardware complicating wound infection ([place]) 02/09/2018   Headache    migraines   Hepatitis    hx of hepatitis C   Infective otitis externa, unspecified    Mixed hyperlipidemia    MRSA infection 02/09/2018   Nonspecific abnormal results of thyroid function study    Other bursitis disorders    Other symptoms involving nervous and musculoskeletal systems(781.99)    Pneumonia    Seizures ([place])    none for 4 years (as of 01/2018) "pseudo seizures"   Thrush 04/07/2018   Past Surgical History:  Procedure Laterality Date   ABDOMINAL HYSTERECTOMY     CARDIAC CATHETERIZATION     CARDIAC CATHETERIZATION N/A 06/25/2016   Procedure: Left Heart Cath and Coronary Angiography;  Surgeon: Dixie Dials, MD;  Location: [facility];  Service: Cardiovascular;  Laterality: N/A;   CESAREAN SECTION     CHOLECYSTECTOMY      COLONOSCOPY     IR FLUORO GUIDED NEEDLE PLC ASPIRATION/INJECTION LOC  12/27/2017   LUMBAR FUSION  01/13/2018   Transforaminal lumbar interbody fusion L4-5 with screws, cages and rods, local bone graft, Allograft, Vivigen, Decompression L4-5 and L5-S1   TONSILLECTOMY     Social History   Socioeconomic History   Marital status: Legally Separated    Spouse name: Not on file   Number of children: 4   Years of education: Not on file   Highest education level: Not on file  Occupational History   Occupation: care giver  Tobacco Use   Smoking status: Former    Types: Cigarettes    Quit date: 01/07/2018    Years since quitting: 4.1   Smokeless tobacco: Never   Tobacco comments:    2 cigarettes a day  Vaping Use   Vaping Use: Former   Quit date: 02/26/2022  Substance and Sexual Activity   Alcohol use: No   Drug use: No   Sexual activity: Yes    Partners: Male  Other Topics Concern   Not on file  Social History Narrative   Not on file   Social Determinants of Health   Financial Resource Strain: Not on file  Food Insecurity: Not on file  Transportation Needs: Not on file  Physical Activity: Not on file  Stress: Not on file  Social Connections: Not on file   Family History  Problem Relation Age of Onset   Crohn's disease  Daughter 16       colon resection   Irritable bowel syndrome Daughter    Colonic polyp Mother    Thyroid disease Mother    COPD Mother    Uterine cancer Maternal Grandmother    Heart murmur Daughter        SVT   Colon cancer Neg Hx    Allergies  Allergen Reactions   Acetaminophen-Codeine Other (See Comments)    Hand get red, feels like it's on fire   Morphine And Related     "makes me feel like i'm on fire"   Nitroglycerin     Causes a headache   Tramadol Hcl     Gives her the shakes   Azithromycin Itching and Rash   Prior to Admission medications   Medication Sig Start Date End Date Taking? Authorizing Provider  nitroGLYCERIN (NITROSTAT) 0.4  MG SL tablet Place 1 tablet (0.4 mg total) under the tongue every 5 (five) minutes x 3 doses as needed for chest pain. 11/26/18  Yes Orpah Cobb, MD  oxyCODONE (OXY IR/ROXICODONE) 5 MG immediate release tablet Take 1 tablet (5 mg total) by mouth every 6 (six) hours as needed for severe pain. 03/06/22  Yes Kerrin Champagne, MD  amLODipine (NORVASC) 2.5 MG tablet Take 1 tablet (2.5 mg total) by mouth daily. Patient not taking: Reported on 02/26/2022 11/26/18   Orpah Cobb, MD  aspirin EC 81 MG EC tablet Take 1 tablet (81 mg total) by mouth daily. Patient not taking: Reported on 02/26/2022 11/27/18   Orpah Cobb, MD  Aspirin-Caffeine [phone] MG PACK Take 1 Package by mouth daily as needed (headache or pain).    [provider]  atorvastatin (LIPITOR) 40 MG tablet Take 1 tablet (40 mg total) by mouth daily at 6 PM. 11/26/18   Orpah Cobb, MD  gabapentin (NEURONTIN) 100 MG capsule Take 1 capsule (100 mg total) by mouth at bedtime. Patient not taking: Reported on 02/26/2022 03/19/21   Kerrin Champagne, MD  HYDROcodone-acetaminophen [facility]) 10-325 MG tablet Take 0.5 tablets by mouth every 6 (six) hours as needed. Patient not taking: Reported on 02/26/2022 02/05/22   Kerrin Champagne, MD  methylPREDNISolone (MEDROL DOSEPAK) 4 MG TBPK tablet Take as directed 6 day dose pak Patient not taking: Reported on 02/26/2022 03/19/21   Kerrin Champagne, MD  metoprolol tartrate (LOPRESSOR) 25 MG tablet Take 0.5 tablets (12.5 mg total) by mouth 2 (two) times daily. Patient not taking: Reported on 02/26/2022 11/26/18   Orpah Cobb, MD  traMADol (ULTRAM) 50 MG tablet Take 1 tablet (50 mg total) by mouth every 12 (twelve) hours as needed. Patient not taking: Reported on 02/26/2022 12/21/21   Cristie Hem, PA-C     Positive ROS: All other systems have been reviewed and were otherwise negative with the exception of those mentioned in the HPI and as above.  Physical Exam: General: Alert, no acute distress Cardiovascular:  No pedal edema Respiratory: No cyanosis, no use of accessory musculature GI: No organomegaly, abdomen is soft and non-tender Skin: No lesions in the area of chief complaint Neurologic: Sensation intact distally Psychiatric: Patient is competent for consent with normal mood and affect Lymphatic: No axillary or cervical lymphadenopathy  MUSCULOSKELETAL: Left hip flexion weakness left foottDF weakness, left knee extension weakness. Right with mild tendency to giving away. SLR negative. Has neurogenic claudication.. Long history of need for narcotics to relieve pain.   Assessment: Lumbar spondylosis  L2-3 and L3-4 with lumbar spondylolisthesis L2-3 above  previous fusion L4-5, thoracolumbar scoliosis  Plan: Plan for Procedure(s): LEFT L2-3 AND L3-4 TRANSFORAMINAL LUMBAR INTERBODY FUSION WITH PEDICLE SCREWS, RODS AND CAGES, REPLACEMENT OF RODS L4-5 WITH EXTENSION TO L2-3, LOCAL AND ALLOGRAFT BONE GRAFT, VIVIGEN  The risks benefits and alternatives were discussed with the patient including but not limited to the risks of nonoperative treatment, versus surgical intervention including infection, bleeding, nerve injury,  blood clots, cardiopulmonary complications, morbidity, mortality, among others, and they were willing to proceed.   Basil Dess, MD Cell [phone] Office [phone] 03/12/2022 7:38 AM

## 2022-03-12 NOTE — Progress Notes (Signed)
Note new drainage to honeycomb dressing lower back. Pt continue to turn from side to side in bed. Was educated on the importance of maintaining correct posture .

## 2022-03-12 NOTE — Progress Notes (Signed)
Pt. Arrived to unit alert c/o of extreme pain, rolling in bed, yelling and screaming, taking clothing off. BP elevated 192/78 Doc notified

## 2022-03-12 NOTE — Progress Notes (Signed)
Orthopedic Tech Progress Note Patient Details:  [name] [birth year] [id number]  Ortho Devices Type of Ortho Device: Thoracolumbar corset (TLSO) Ortho Device/Splint Location: Back Ortho Device/Splint Interventions: Ordered  Dropped brace off to pt nurse in room.    Al Decant 03/12/2022, 8:36 PM

## 2022-03-12 NOTE — Transfer of Care (Signed)
Immediate Anesthesia Transfer of Care Note  Patient: [name]  Procedure(s) Performed: LEFT L2-3 AND L3-4 TRANSFORAMINAL LUMBAR INTERBODY FUSION WITH PEDICLE SCREWS, RODS AND CAGES, REPLACEMENT OF RODS L4-5 WITH EXTENSION TO L2-3, LOCAL AND ALLOGRAFT BONE GRAFT, VIVIGEN (Back)  Patient Location: PACU  Anesthesia Type:General  Level of Consciousness: awake, alert , patient cooperative and responds to stimulation  Airway & Oxygen Therapy: Patient Spontanous Breathing and Patient connected to face mask oxygen  Post-op Assessment: Report given to RN, Post -op Vital signs reviewed and stable and Patient moving all extremities X 4  Post vital signs: Reviewed and stable  Last Vitals:  Vitals Value Taken Time  BP 145/86 03/12/22 1353  Temp    Pulse 123 03/12/22 1357  Resp 17 03/12/22 1357  SpO2 100 % 03/12/22 1357  Vitals shown include unvalidated device data.  Last Pain:  Vitals:   03/12/22 0616  TempSrc: Oral  PainSc:       Patients Stated Pain Goal: 2 ([id number])  Complications: No notable events documented.

## 2022-03-12 NOTE — Brief Op Note (Signed)
03/12/2022  1:47 PM  PATIENT:  [name]  66 y.o. female  PRE-OPERATIVE DIAGNOSIS:  Lumbar spondylosis  L2-3 and L3-4 with lumbar spondylolisthesis L2-3 above previous fusion L4-5, thoracolumbar scoliosis  POST-OPERATIVE DIAGNOSIS:  Lumbar spondylosis  L2-3 and L3-4 with lumbar spondylolisthesis L2-3 above previous fusion L4-5, thoracolumbar scoliosis  PROCEDURE:  Procedure(s): LEFT L2-3 AND L3-4 TRANSFORAMINAL LUMBAR INTERBODY FUSION WITH PEDICLE SCREWS, RODS AND CAGES, REPLACEMENT OF RODS L4-5 WITH EXTENSION TO L2-3, LOCAL AND ALLOGRAFT BONE GRAFT, VIVIGEN (N/A)  SURGEON:  Surgeon(s) and Role:    * Kerrin Champagne, MD - Primary  PHYSICIAN ASSISTANT: Andee Lineman  ANESTHESIA:   local and general  EBL:  450 mL   BLOOD ADMINISTERED: 250 CC CELLSAVER  DRAINS: Urinary Catheter (Foley)   LOCAL MEDICATIONS USED:  MARCAINE 0.5% 1:1 EXPAREL 1.3%Amount: 10 ml  SPECIMEN:  No Specimen  DISPOSITION OF SPECIMEN:  N/A  COUNTS:  YES  TOURNIQUET:  * No tourniquets in log *  DICTATION: .Dragon Dictation  PLAN OF CARE: Admit for overnight observation  PATIENT DISPOSITION:  PACU - hemodynamically stable.   Delay start of Pharmacological VTE agent (>24hrs) due to surgical blood loss or risk of bleeding: yes

## 2022-03-12 NOTE — Progress Notes (Signed)
Dressing changed per Dr Louanne Skye

## 2022-03-12 NOTE — Anesthesia Procedure Notes (Signed)
Procedure Name: Intubation Date/Time: 03/12/2022 7:53 AM Performed by: Glynda Jaeger, CRNA Pre-anesthesia Checklist: Patient identified, Emergency Drugs available, Suction available and Patient being monitored Patient Re-evaluated:Patient Re-evaluated prior to induction Oxygen Delivery Method: Circle System Utilized Preoxygenation: Pre-oxygenation with 100% oxygen Induction Type: IV induction Ventilation: Mask ventilation without difficulty Laryngoscope Size: Mac and 4 Grade View: Grade I Tube type: Oral Tube size: 7.0 mm Number of attempts: 1 Airway Equipment and Method: Stylet and Oral airway Placement Confirmation: ETT inserted through vocal cords under direct vision, positive ETCO2 and breath sounds checked- equal and bilateral Secured at: 21 cm Tube secured with: Tape Dental Injury: Teeth and Oropharynx as per pre-operative assessment  Comments: Placed by Lucita Ferrara, SRNA

## 2022-03-12 NOTE — Anesthesia Procedure Notes (Signed)
Arterial Line Insertion Start/End5/30/2023 7:10 AM, 03/12/2022 7:15 AM Performed by: Lannie Fields, DO, Glora Hulgan Rochele Raring, CRNA, CRNA  Preanesthetic checklist: patient identified, IV checked, site marked, risks and benefits discussed, surgical consent, monitors and equipment checked, pre-op evaluation, timeout performed and anesthesia consent Patient sedated Right, radial was placed Catheter size: 20 G  Attempts: 1 Procedure performed without using ultrasound guided technique. Following insertion, dressing applied and Biopatch. Post procedure assessment: normal  Patient tolerated the procedure well with no immediate complications.

## 2022-03-12 NOTE — Progress Notes (Signed)
Dr. Otelia Sergeant made aware patient's PCR resulted positive for MRSA. Verbal order received for Vancomycin 1 gram and profend.

## 2022-03-12 NOTE — Progress Notes (Signed)
Protocol for vanc for pre-procedure prophylaxis due to MRSA PCR+. Pt has already gotten vanc pre-op. Issue is resolved.  Onnie Boer, PharmD, BCIDP, AAHIVP, CPP Infectious Disease Pharmacist 03/12/2022 4:18 PM

## 2022-03-12 NOTE — Anesthesia Postprocedure Evaluation (Signed)
Anesthesia Post Note  Patient: [name]  Procedure(s) Performed: LEFT L2-3 AND L3-4 TRANSFORAMINAL LUMBAR INTERBODY FUSION WITH PEDICLE SCREWS, RODS AND CAGES, REPLACEMENT OF RODS L4-5 WITH EXTENSION TO L2-3, LOCAL AND ALLOGRAFT BONE GRAFT, VIVIGEN (Back)     Patient location during evaluation: PACU Anesthesia Type: General Level of consciousness: awake and alert, oriented and patient cooperative Pain management: pain level controlled Vital Signs Assessment: post-procedure vital signs reviewed and stable Respiratory status: spontaneous breathing, nonlabored ventilation and respiratory function stable Cardiovascular status: blood pressure returned to baseline and stable Postop Assessment: no apparent nausea or vomiting Anesthetic complications: no Comments: Pain difficult to control in PACU   No notable events documented.  Last Vitals:  Vitals:   03/12/22 1520 03/12/22 1525  BP:  (!) 175/84  Pulse: 84 88  Resp: 18 19  Temp: 36.7 C   SpO2: 99% 99%    Last Pain:  Vitals:   03/12/22 1520  TempSrc:   PainSc: 10-Worst pain ever                 Pervis Hocking

## 2022-03-12 NOTE — Interval H&P Note (Signed)
History and Physical Interval Note:  03/12/2022 7:41 AM  [name]  has presented today for surgery, with the diagnosis of Lumbar spondylosis  L2-3 and L3-4 with lumbar spondylolisthesis L2-3 above previous fusion L4-5, thoracolumbar scoliosis.  The various methods of treatment have been discussed with the patient and family. After consideration of risks, benefits and other options for treatment, the patient has consented to  Procedure(s): LEFT L2-3 AND L3-4 TRANSFORAMINAL LUMBAR INTERBODY FUSION WITH PEDICLE SCREWS, RODS AND CAGES, REPLACEMENT OF RODS L4-5 WITH EXTENSION TO L2-3, LOCAL AND ALLOGRAFT BONE GRAFT, VIVIGEN (N/A) as a surgical intervention.  The patient's history has been reviewed, patient examined, no change in status, stable for surgery.  I have reviewed the patient's chart and labs.  Questions were answered to the patient's satisfaction.     Basil Dess

## 2022-03-13 DIAGNOSIS — Z87891 Personal history of nicotine dependence: Secondary | ICD-10-CM | POA: Diagnosis not present

## 2022-03-13 DIAGNOSIS — Z885 Allergy status to narcotic agent status: Secondary | ICD-10-CM | POA: Diagnosis not present

## 2022-03-13 DIAGNOSIS — E782 Mixed hyperlipidemia: Secondary | ICD-10-CM | POA: Diagnosis present

## 2022-03-13 DIAGNOSIS — Z981 Arthrodesis status: Secondary | ICD-10-CM | POA: Diagnosis not present

## 2022-03-13 DIAGNOSIS — M4316 Spondylolisthesis, lumbar region: Secondary | ICD-10-CM | POA: Diagnosis present

## 2022-03-13 DIAGNOSIS — M48062 Spinal stenosis, lumbar region with neurogenic claudication: Secondary | ICD-10-CM | POA: Diagnosis present

## 2022-03-13 DIAGNOSIS — Z79899 Other long term (current) drug therapy: Secondary | ICD-10-CM | POA: Diagnosis not present

## 2022-03-13 DIAGNOSIS — M47816 Spondylosis without myelopathy or radiculopathy, lumbar region: Secondary | ICD-10-CM | POA: Diagnosis present

## 2022-03-13 DIAGNOSIS — Z888 Allergy status to other drugs, medicaments and biological substances status: Secondary | ICD-10-CM | POA: Diagnosis not present

## 2022-03-13 DIAGNOSIS — Z7982 Long term (current) use of aspirin: Secondary | ICD-10-CM | POA: Diagnosis not present

## 2022-03-13 DIAGNOSIS — Z881 Allergy status to other antibiotic agents status: Secondary | ICD-10-CM | POA: Diagnosis not present

## 2022-03-13 DIAGNOSIS — M4156 Other secondary scoliosis, lumbar region: Secondary | ICD-10-CM | POA: Diagnosis present

## 2022-03-13 LAB — CBC
HCT: 34 % — ABNORMAL LOW (ref 36.0–46.0)
Hemoglobin: 11.3 g/dL — ABNORMAL LOW (ref 12.0–15.0)
MCH: 29.3 pg (ref 26.0–34.0)
MCHC: 33.2 g/dL (ref 30.0–36.0)
MCV: 88.1 fL (ref 80.0–100.0)
Platelets: 320 10*3/uL (ref 150–400)
RBC: 3.86 MIL/uL — ABNORMAL LOW (ref 3.87–5.11)
RDW: 15.9 % — ABNORMAL HIGH (ref 11.5–15.5)
WBC: 17.1 10*3/uL — ABNORMAL HIGH (ref 4.0–10.5)
nRBC: 0 % (ref 0.0–0.2)

## 2022-03-13 LAB — BASIC METABOLIC PANEL
Anion gap: 4 — ABNORMAL LOW (ref 5–15)
BUN: 6 mg/dL — ABNORMAL LOW (ref 8–23)
CO2: 27 mmol/L (ref 22–32)
Calcium: 8.2 mg/dL — ABNORMAL LOW (ref 8.9–10.3)
Chloride: 107 mmol/L (ref 98–111)
Creatinine, Ser: 0.75 mg/dL (ref 0.44–1.00)
GFR, Estimated: >60 mL/min (ref >60)
Glucose, Bld: 118 mg/dL — ABNORMAL HIGH (ref 70–99)
Potassium: 3.7 mmol/L (ref 3.5–5.1)
Sodium: 138 mmol/L (ref 135–145)

## 2022-03-13 MED ORDER — HYDROMORPHONE HCL 1 MG/ML IJ SOLN
1.0000 mg | INTRAMUSCULAR | Status: DC | PRN
  Administered 2022-03-13 – 2022-03-14 (×9): 1 mg via INTRAVENOUS
  Filled 2022-03-13 (×9): qty 1

## 2022-03-13 MED ORDER — OXYCODONE HCL ER 15 MG PO T12A
30.0000 mg | EXTENDED_RELEASE_TABLET | Freq: Two times a day (BID) | ORAL | Status: DC
  Administered 2022-03-13 – 2022-03-15 (×4): 30 mg via ORAL
  Filled 2022-03-13 (×4): qty 2

## 2022-03-13 MED ORDER — PANTOPRAZOLE SODIUM 40 MG PO TBEC
40.0000 mg | DELAYED_RELEASE_TABLET | Freq: Every day | ORAL | Status: DC
  Administered 2022-03-13 – 2022-03-14 (×2): 40 mg via ORAL
  Filled 2022-03-13 (×2): qty 1

## 2022-03-13 NOTE — TOC Initial Note (Signed)
Transition of Care [facility]) - Initial/Assessment Note    Patient Details  Name: [name] MRN: [id number] Date of Birth: Jul 16, 1956  Transition of Care [facility]) CM/SW Contact:    Epifanio Lesches, RN Phone Number: 03/13/2022, 4:42 PM  Clinical Narrative:                 Admitted for L2-4 fusion and replacement of L4-5 rods      - S/P  LEFT L2-3 AND L3-4 TRANSFORAMINAL LUMBAR INTERBODY FUSION, 5/30 From home with family ( mom/ brother). PTA independent with ADL's, no DME usage. NCM spoke with pt regarding d/c planning. Shared PT evaluation for home health services. Pt agreeable. Pt without provider preference. Referral made with [facility] and accepted. Referral made with [employer] for DME: RW, BSC. Equipment will be delivered to bedside  to d/c.  Family to provide transportation to home once d/c ready.  TOC team will contine to monitor and assist with needs..  Expected Discharge Plan: Home w Home Health Services (Resides with mother and brother) Barriers to Discharge: Continued Medical Work up   Patient Goals and CMS Choice     Choice offered to / list presented to : Patient  Expected Discharge Plan and Services Expected Discharge Plan: Home w Home Health Services (Resides with mother and brother)   Discharge Planning Services: CM Consult   Living arrangements for the past 2 months: Single Family Home                 DME Arranged: 3-N-1, Walker rolling DME Agency: [employer] Date DME Agency Contacted: 03/13/22 Time DME Agency Contacted: [phone] Representative spoke with at DME Agency: Beola Cord HH Arranged: PT HH Agency: [facility] Date [facility] Agency Contacted: 03/13/22 Time HH Agency Contacted: [phone] Representative spoke with at [facility] Agency: Kandee Keen  Prior Living Arrangements/Services Living arrangements for the past 2 months: Single Family Home Lives with:: Other (Comment) (mom and brother) Patient language and need for interpreter reviewed:: Yes Do  you feel safe going back to the place where you live?: Yes      Need for Family Participation in Patient Care: Yes (Comment) Care giver support system in place?: Yes (comment)   Criminal Activity/Legal Involvement Pertinent to Current Situation/Hospitalization: No - Comment as needed  Activities of Daily Living Home Assistive Devices/Equipment: None ADL Screening (condition at time of admission) Patient's cognitive ability adequate to safely complete daily activities?: No (At time of admission assessment she was thrashing about in the bed, pulling off tubes. She appeared to be in pain. Nurse is aware and discussing with MD) Is the patient deaf or have difficulty hearing?: No Does the patient have difficulty seeing, even when wearing glasses/contacts?: Yes Does the patient have difficulty concentrating, remembering, or making decisions?: Yes Patient able to express need for assistance with ADLs?: No Does the patient have difficulty dressing or bathing?: Yes Independently performs ADLs?: No Communication: Independent Dressing (OT): Needs assistance Is this a change from baseline?: Change from baseline, expected to last >3 days Grooming: Needs assistance Is this a change from baseline?: Change from baseline, expected to last >3 days Feeding: Independent Bathing: Needs assistance Is this a change from baseline?: Change from baseline, expected to last >3 days Toileting: Needs assistance Is this a change from baseline?: Change from baseline, expected to last >3days In/Out Bed: Needs assistance Is this a change from baseline?: Change from baseline, expected to last >3 days Walks in Home: Needs assistance Is this a change from  baseline?: Change from baseline, expected to last >3 days Does the patient have difficulty walking or climbing stairs?: Yes Weakness of Legs: Both Weakness of Arms/Hands: None  Permission Sought/Granted                  Emotional Assessment        Orientation: : Oriented to Self, Oriented to Place, Oriented to  Time, Oriented to Situation Alcohol / Substance Use: Not Applicable Psych Involvement: No (comment)  Admission diagnosis:  Fusion of spine of lumbar region [M43.26] Patient Active Problem List   Diagnosis Date Noted   Fusion of spine of lumbar region 03/12/2022   Acute coronary syndrome (HCC) 11/25/2018   Thrush 04/07/2018   Drug-induced skin rash 04/07/2018   Chronic hepatitis C without hepatic coma (HCC) 02/09/2018   Hardware complicating wound infection (HCC) 02/09/2018   MRSA infection 02/09/2018   Anemia due to blood loss, acute 01/16/2018    Class: Acute   Infection of lumbar spine (HCC) 01/16/2018    Class: Acute   Spinal stenosis of lumbar region 01/13/2018    Class: Chronic   Spondylolisthesis, lumbar region 01/13/2018    Class: Chronic   Fusion of lumbar spine 01/13/2018   Low back pain 12/23/2017   Tobacco use disorder 06/24/2016   Weight gain 01/02/2012   Stool incontinence 11/19/2011   Abdominal pain, right lower quadrant 11/19/2011   EUSTACHIAN TUBE DYSFUNCTION 08/10/2009   SINUSITIS, ACUTE 08/10/2009   TRANSIENT GLOBAL AMNESIA 01/12/2009   HIP PAIN, RIGHT 10/04/2008   HEPATITIS C 07/28/2008   COPD, 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   HYPERLIPIDEMIA, MIXED, WITH LOW HDL 08/30/2007   OTITIS EXTERNA, INFECTIVE NOS 08/27/2007   ROTATOR CUFF SYNDROME, RIGHT 08/27/2007   Chest pain at rest 08/27/2007   BURSITIS NOS 07/13/2007   MUSCULOSKELETAL PAIN 07/13/2007   CARPAL TUNNEL SYNDROME, BILATERAL 04/10/2007   PCP:  Orpah Cobb, MD Pharmacy:   [facility] [address] [facility], [place] - [address] [facility] [place] [address] Phone: [phone] Fax: [phone]  [facility] [phone] - [place], [place] - [address] AT [facility] [address] [place] [address] Phone: [phone] Fax: [phone]     Social Determinants of Health (SDOH)  Interventions    Readmission Risk Interventions     View : No data to display.

## 2022-03-13 NOTE — Evaluation (Signed)
Physical Therapy Evaluation Patient Details Name: [name] MRN: [id number] DOB: 02-Sep-1956 Today's Date: 03/13/2022  History of Present Illness  Pt is a 66 y/o female admitted for L2-4 fusion and replacement of L4-5 rods (from previous fusion) in setting of lumbar spondylosis at L2-3 and L3-4 levels. PMH: L4-5 fusion in 2019, hepatitis C, GERD, seizures, cardiac cath, carpal tunnel syndrome.  Clinical Impression  Pt admitted with above diagnosis. Pt was able to ambulate with RW with min assist (2nd person pushing IV pole).  Pt limited by pain but did well overall  with PT able to complete education with daughter that was in room. Pt needs reinforcement with bed mobility and brace application as well as safety with transitions. Will follow acutely. Pt currently with functional limitations due to the deficits listed below (see PT Problem List). Pt will benefit from skilled PT to increase their independence and safety with mobility to allow discharge to the venue listed below.          Recommendations for follow up therapy are one component of a multi-disciplinary discharge planning process, led by the attending physician.  Recommendations may be updated based on patient status, additional functional criteria and insurance authorization.  Follow Up Recommendations Home health PT    Assistance Recommended at Discharge Intermittent Supervision/Assistance  Patient can return home with the following  A little help with walking and/or transfers;A little help with bathing/dressing/bathroom;Help with stairs or ramp for entrance;Direct supervision/assist for medications management;Assistance with cooking/housework    Equipment Recommendations Rolling walker (2 wheels);BSC/3in1  Recommendations for Other Services       Functional Status Assessment Patient has had a recent decline in their functional status and demonstrates the ability to make significant improvements in function in a reasonable and  predictable amount of time.     Precautions / Restrictions Precautions Precautions: Fall;Back Precaution Booklet Issued: Yes (comment) Required Braces or Orthoses: Spinal Brace Spinal Brace: Thoracolumbosacral orthotic;Applied in sitting position Restrictions Weight Bearing Restrictions: No RLE Weight Bearing: Weight bearing as tolerated LLE Weight Bearing: Weight bearing as tolerated      Mobility  Bed Mobility Overal bed mobility: Needs Assistance Bed Mobility: Rolling, Sidelying to Sit, Sit to Sidelying Rolling: Min assist Sidelying to sit: Mod assist     Sit to sidelying: Min guard, Min assist General bed mobility comments: cues to maintain spinal precautions and fully roll.  Pt reaching to pull up on therapist's neck and PT instructing pt she cannot do this with family as she will hurt them.  Pt not listening for good technique to come to EOB. Did better when going back to side at end of treatemnt.    Transfers Overall transfer level: Needs assistance Equipment used: Rolling walker (2 wheels) Transfers: Sit to/from Stand, Bed to chair/wheelchair/BSC Sit to Stand: Min assist, From elevated surface   Step pivot transfers: Min assist, From elevated surface       General transfer comment: Pt stated she would try to get up and needed to urinate. Pt was able to take pivotal steps to 3N1 with assist and RW. then PT put back brace on after pt had urinated as she stated she couldnt wait for brace before using bathroom.  pt and daughrter educated regarding brace application. Pt stated that brace felt good. Cues for technique for hand placement to come to standing.    Ambulation/Gait Ambulation/Gait assistance: Min guard, Min assist, +2 safety/equipment Gait Distance (Feet): 125 Feet Assistive device: Rolling walker (2 wheels) Gait Pattern/deviations: Step-through  pattern, Decreased stride length, Trunk flexed   Gait velocity interpretation: <1.31 ft/sec, indicative of  household ambulator   General Gait Details: Pt was able to ambulate with RW with min guard assist to min assist at times for stability. Daughter present and states she feels comfortable assisting pt with mobility.  Stairs Stairs:  (Pt states she recalls how to do steps from last visit to hospital and did not want to practice)          Wheelchair Mobility    Modified Rankin (Stroke Patients Only)       Balance Overall balance assessment: Needs assistance Sitting-balance support: No upper extremity supported, Feet supported Sitting balance-Leahy Scale: Fair     Standing balance support: Bilateral upper extremity supported, During functional activity Standing balance-Leahy Scale: Poor Standing balance comment: relies on UE support                             Pertinent Vitals/Pain Pain Assessment Pain Assessment: 0-10 Pain Score: 8  Pain Location: low back through groin Pain Descriptors / Indicators: Grimacing, Guarding, Crying Pain Intervention(s): Limited activity within patient's tolerance, Monitored during session, Repositioned, Patient requesting pain meds-RN notified, Ice applied, Utilized relaxation techniques    Home Living Family/patient expects to be discharged to:: Private residence Living Arrangements: Other relatives (reports living with brother and her mother (though may mean sister in law)) Available Help at Discharge: Family;Available 24 hours/day (per daughter she will have 24 hour care) Type of Home: House Home Access: Stairs to enter Entrance Stairs-Rails: Right Entrance Stairs-Number of Steps: 5   Home Layout: One level Home Equipment: None      Prior Function Prior Level of Function : Independent/Modified Independent             Mobility Comments: was mobilizing without AD ADLs Comments: Able to complete ADLs but limited by back pain     Hand Dominance   Dominant Hand: Right    Extremity/Trunk Assessment   Upper Extremity  Assessment Upper Extremity Assessment: Defer to OT evaluation    Lower Extremity Assessment Lower Extremity Assessment: Generalized weakness    Cervical / Trunk Assessment Cervical / Trunk Assessment: Back Surgery  Communication   Communication: No difficulties  Cognition Arousal/Alertness: Awake/alert Behavior During Therapy: Anxious, Restless Overall Cognitive Status: No family/caregiver present to determine baseline cognitive functioning                                 General Comments: pt tearful throughout, asking for more pain meds though had already received them, unable to recall back precautions and requires cues to maintain throughout. anxious and moving about bed. Pt snapped at this PT when trying to review precautions and handout with pt stating, "lets get this over with."        General Comments      Exercises     Assessment/Plan    PT Assessment Patient needs continued PT services  PT Problem List Decreased activity tolerance;Decreased balance;Decreased mobility;Decreased knowledge of use of DME;Decreased safety awareness;Decreased knowledge of precautions;Pain       PT Treatment Interventions DME instruction;Gait training;Stair training;Functional mobility training;Therapeutic activities;Therapeutic exercise;Balance training;Patient/family education    PT Goals (Current goals can be found in the Care Plan section)  Acute Rehab PT Goals Patient Stated Goal: to go home PT Goal Formulation: With patient Time For Goal Achievement: 03/27/22 Potential to Achieve  Goals: Good    Frequency Min 5X/week     Co-evaluation               AM-PAC PT "6 Clicks" Mobility  Outcome Measure Help needed turning from your back to your side while in a flat bed without using bedrails?: A Little Help needed moving from lying on your back to sitting on the side of a flat bed without using bedrails?: A Little Help needed moving to and from a bed to a chair  (including a wheelchair)?: A Little Help needed standing up from a chair using your arms (e.g., wheelchair or bedside chair)?: A Little Help needed to walk in hospital room?: Total Help needed climbing 3-5 steps with a railing? : Total 6 Click Score: 14    End of Session Equipment Utilized During Treatment: Gait belt Activity Tolerance: Patient limited by fatigue;Patient limited by pain Patient left: in bed;with call bell/phone within reach;with bed alarm set;with family/visitor present Nurse Communication: Mobility status;Patient requests pain meds PT Visit Diagnosis: Unsteadiness on feet (R26.81);Muscle weakness (generalized) (M62.81);Pain Pain - part of body:  (back)    Time: [id number] PT Time Calculation (min) (ACUTE ONLY): 31 min   Charges:   PT Evaluation $PT Eval Moderate Complexity: 1 Mod PT Treatments $Gait Training: 8-22 mins        Jakaleb Payer M,PT Acute Rehab Services [phone] (pager)   Alvira Philips 03/13/2022, 1:06 PM

## 2022-03-13 NOTE — Evaluation (Signed)
Occupational Therapy Evaluation Patient Details Name: [name] MRN: [id number] DOB: 28-Nov-1955 Today's Date: 03/13/2022   History of Present Illness Pt is a 66 y/o female admitted for L2-4 fusion and replacement of L4-5 rods (from previous fusion) in setting of lumbar spondylosis at L2-3 and L3-4 levels. PMH: L4-5 fusion in 2019, hepatitis C, GERD, seizures, cardiac cath, carpal tunnel syndrome.   Clinical Impression   PTA, pt lives with family, typically ambulatory and able to complete ADLs without assist though limited by back pain. Pt presents now with significant reports of pain and anxiety (despite medication administered prior to session). Evaluation limited to bed level only due to this with pt difficult to console. Pt's daughter entering at end of session and confirms family can assist at home and would need to obtain DME prior to DC home. Educated plan to trial another therapy session in coordination for different pain meds to assess any functional ability improvements. At this time, would recommend HHOT and 24/7 assist at home pending ability to progress OOB safely. Would recommend an additional night at hospital to work with therapies tomorrow in hopes to safely DC home with decreased fall risk.     Recommendations for follow up therapy are one component of a multi-disciplinary discharge planning process, led by the attending physician.  Recommendations may be updated based on patient status, additional functional criteria and insurance authorization.   Follow Up Recommendations  Home health OT (pending progression)    Assistance Recommended at Discharge Frequent or constant Supervision/Assistance  Patient can return home with the following A lot of help with walking and/or transfers;A lot of help with bathing/dressing/bathroom    Functional Status Assessment  Patient has had a recent decline in their functional status and demonstrates the ability to make significant  improvements in function in a reasonable and predictable amount of time.  Equipment Recommendations  BSC/3in1;Other (comment) (Rolling walker)    Recommendations for Other Services       Precautions / Restrictions Precautions Precautions: Fall;Back Precaution Booklet Issued: Yes (comment) Required Braces or Orthoses: Spinal Brace Spinal Brace: Thoracolumbosacral orthotic;Applied in sitting position Restrictions Weight Bearing Restrictions: No      Mobility Bed Mobility Overal bed mobility: Needs Assistance Bed Mobility: Rolling Rolling: Min assist         General bed mobility comments: cues to maintain spinal precautions and fully roll for bed linen change due to bloody drainage through sheets    Transfers                   General transfer comment: unable to attempt d/t pain and restlessness      Balance                                           ADL either performed or assessed with clinical judgement   ADL Overall ADL's : Needs assistance/impaired Eating/Feeding: Set up;Sitting   Grooming: Set up;Bed level   Upper Body Bathing: Moderate assistance;Bed level   Lower Body Bathing: Maximal assistance;Bed level   Upper Body Dressing : Moderate assistance;Bed level Upper Body Dressing Details (indicate cue type and reason): noted pt naked on entry, assisted in donning hospital gown bed level to maintain dignity Lower Body Dressing: Total assistance;Bed level       Toileting- Clothing Manipulation and Hygiene: Total assistance;Bed level         General  ADL Comments: Pt limited by pain, restlessness and anxiety. Pt crying (despite morphine administration prior) and difficult to console. Unable to progress OOB     Vision Ability to See in Adequate Light: 0 Adequate Patient Visual Report: No change from baseline Vision Assessment?: No apparent visual deficits     Perception     Praxis      Pertinent Vitals/Pain Pain  Assessment Pain Assessment: Faces Faces Pain Scale: Hurts even more Pain Location: low back through groin Pain Descriptors / Indicators: Grimacing, Guarding, Crying Pain Intervention(s): Monitored during session, Premedicated before session, Repositioned, Ice applied     Hand Dominance Right   Extremity/Trunk Assessment Upper Extremity Assessment Upper Extremity Assessment: Generalized weakness   Lower Extremity Assessment Lower Extremity Assessment: Defer to PT evaluation   Cervical / Trunk Assessment Cervical / Trunk Assessment: Back Surgery   Communication Communication Communication: No difficulties   Cognition Arousal/Alertness: Awake/alert Behavior During Therapy: Anxious, Restless Overall Cognitive Status: No family/caregiver present to determine baseline cognitive functioning                                 General Comments: reported home setup different than what is noted in chart. pt tearful throughout, asking for more pain meds though had already received them, unable to recall back precautions and requires cues to maintain throughout. anxious and moving about bed, requesting more ativan     General Comments  Noted bleeding through bandage and sheets, RN in to assess and assist with linen change and reapply dressing. Daughter entering at end of session, confirms ability to assist    Exercises     Shoulder Instructions      Home Living Family/patient expects to be discharged to:: Private residence Living Arrangements: Other relatives (reports living with brother and her mother (though may mean sister in law)) Available Help at Discharge: Family Type of Home: House Home Access: Stairs to enter Secretary/administratorntrance Stairs-Number of Steps: 5 Entrance Stairs-Rails: Right Home Layout: One level     Bathroom Shower/Tub: Chief Strategy OfficerTub/shower unit   Bathroom Toilet: Standard     Home Equipment: None   Additional Comments: home setup from prior evaluation. daughter in  at end of session, reports family can provide assist and that they have no DME      Prior Functioning/Environment Prior Level of Function : Independent/Modified Independent             Mobility Comments: was mobilizing without AD ADLs Comments: Able to complete ADLs but limited by back pain        OT Problem List: Decreased strength;Decreased activity tolerance;Impaired balance (sitting and/or standing);Pain;Decreased knowledge of precautions;Decreased knowledge of use of DME or AE;Decreased safety awareness;Decreased cognition      OT Treatment/Interventions: Self-care/ADL training;Therapeutic exercise;Energy conservation;DME and/or AE instruction;Therapeutic activities;Patient/family education;Balance training    OT Goals(Current goals can be found in the care plan section) Acute Rehab OT Goals Patient Stated Goal: decrease pain, have oxy OT Goal Formulation: With patient Time For Goal Achievement: 03/27/22 Potential to Achieve Goals: Good  OT Frequency: Min 2X/week    Co-evaluation              AM-PAC OT "6 Clicks" Daily Activity     Outcome Measure Help from another person eating meals?: A Little Help from another person taking care of personal grooming?: A Little Help from another person toileting, which includes using toliet, bedpan, or urinal?: Total Help from another person  bathing (including washing, rinsing, drying)?: A Lot Help from another person to put on and taking off regular upper body clothing?: A Lot Help from another person to put on and taking off regular lower body clothing?: Total 6 Click Score: 12   End of Session Nurse Communication: Mobility status;Patient requests pain meds  Activity Tolerance: Patient limited by pain Patient left: in bed;with bed alarm set;with call bell/phone within reach  OT Visit Diagnosis: Unsteadiness on feet (R26.81);Other abnormalities of gait and mobility (R26.89)                Time: 2671-2458 OT Time  Calculation (min): 22 min Charges:  OT General Charges $OT Visit: 1 Visit OT Evaluation $OT Eval Moderate Complexity: 1 Mod  Bradd Canary, OTR/L Acute Rehab Services Office: [phone]   Lorre Munroe 03/13/2022, 8:06 AM

## 2022-03-13 NOTE — Progress Notes (Signed)
     Subjective: 1 Day Post-Op Procedure(s) (LRB): LEFT L2-3 AND L3-4 TRANSFORAMINAL LUMBAR INTERBODY FUSION WITH PEDICLE SCREWS, RODS AND CAGES, REPLACEMENT OF RODS L4-5 WITH EXTENSION TO L2-3, LOCAL AND ALLOGRAFT BONE GRAFT, VIVIGEN (N/A) Painful, rolling in bed with pain in PACU, IV dilaudid and still difficult to decrease pain. Recently stopped smoking 02/27/2012. She is having pain in excess of expected and her  Lab shows no significant anemia or renal dysfunction.  Patient reports pain as severe.    Objective:   VITALS:  Temp:  [97.9 F (36.6 C)-98.7 F (37.1 C)] 98.6 F (37 C) (05/31 1601) Pulse Rate:  [67-86] 67 (05/31 1601) Resp:  [17-19] 17 (05/31 1601) BP: (130-178)/(52-84) 130/52 (05/31 1601) SpO2:  [96 %-97 %] 97 % (05/31 1601)  Neurologically intact ABD soft Neurovascular intact Sensation intact distally Intact pulses distally Dorsiflexion/Plantar flexion intact Incision: scant drainage Compartment soft   LABS Recent Labs    03/13/22 0326  HGB 11.3*  WBC 17.1*  PLT 320   Recent Labs    03/13/22 0326  NA 138  K 3.7  CL 107  CO2 27  BUN 6*  CREATININE 0.75  GLUCOSE 118*   No results for input(s): LABPT, INR in the last 72 hours.   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 Increase baseline oxycontin to 30mg  and continue with intermittant oxyIR 10 mg. Ativan for anxiety.  Basil Dess 03/13/2022, 4:48 PM Patient ID: [name], female   DOB: [birth year], 66 y.o.   MRN: [id number]

## 2022-03-13 NOTE — Plan of Care (Signed)
Very anxious at beginning of shift. MD notified. See prn  orders. No other acute events overnight.    Problem: Education: Goal: Knowledge of General Education information will improve Description: Including pain rating scale, medication(s)/side effects and non-pharmacologic comfort measures 03/13/2022 0436 by Ardis Hughs, RN Outcome: Progressing 03/13/2022 0435 by Ardis Hughs, RN Outcome: Progressing   Problem: Health Behavior/Discharge Planning: Goal: Ability to manage health-related needs will improve 03/13/2022 0436 by Ardis Hughs, RN Outcome: Progressing 03/13/2022 0435 by Ardis Hughs, RN Outcome: Progressing   Problem: Clinical Measurements: Goal: Ability to maintain clinical measurements within normal limits will improve 03/13/2022 0436 by Ardis Hughs, RN Outcome: Progressing 03/13/2022 0435 by Ardis Hughs, RN Outcome: Progressing Goal: Will remain free from infection 03/13/2022 0436 by Ardis Hughs, RN Outcome: Progressing 03/13/2022 0435 by Ardis Hughs, RN Outcome: Progressing Goal: Diagnostic test results will improve 03/13/2022 0436 by Ardis Hughs, RN Outcome: Progressing 03/13/2022 0435 by Ardis Hughs, RN Outcome: Progressing Goal: Respiratory complications will improve 03/13/2022 0436 by Ardis Hughs, RN Outcome: Progressing 03/13/2022 0435 by Ardis Hughs, RN Outcome: Progressing Goal: Cardiovascular complication will be avoided 03/13/2022 0436 by Ardis Hughs, RN Outcome: Progressing 03/13/2022 0435 by Ardis Hughs, RN Outcome: Progressing   Problem: Activity: Goal: Risk for activity intolerance will decrease 03/13/2022 0436 by Ardis Hughs, RN Outcome: Progressing 03/13/2022 0435 by Ardis Hughs, RN Outcome: Progressing   Problem: Nutrition: Goal: Adequate nutrition will be maintained 03/13/2022 0436 by Ardis Hughs, RN Outcome: Progressing 03/13/2022 0435 by Ardis Hughs, RN Outcome: Progressing   Problem: Coping: Goal: Level of anxiety will  decrease 03/13/2022 0436 by Ardis Hughs, RN Outcome: Progressing 03/13/2022 0435 by Ardis Hughs, RN Outcome: Progressing   Problem: Elimination: Goal: Will not experience complications related to bowel motility 03/13/2022 0436 by Ardis Hughs, RN Outcome: Progressing 03/13/2022 0435 by Ardis Hughs, RN Outcome: Progressing Goal: Will not experience complications related to urinary retention 03/13/2022 0436 by Ardis Hughs, RN Outcome: Progressing 03/13/2022 0435 by Ardis Hughs, RN Outcome: Progressing   Problem: Pain Managment: Goal: General experience of comfort will improve 03/13/2022 0436 by Ardis Hughs, RN Outcome: Progressing 03/13/2022 0435 by Ardis Hughs, RN Outcome: Progressing   Problem: Safety: Goal: Ability to remain free from injury will improve 03/13/2022 0436 by Ardis Hughs, RN Outcome: Progressing 03/13/2022 0435 by Ardis Hughs, RN Outcome: Progressing   Problem: Skin Integrity: Goal: Risk for impaired skin integrity will decrease 03/13/2022 0436 by Ardis Hughs, RN Outcome: Progressing 03/13/2022 0435 by Ardis Hughs, RN Outcome: Progressing   Problem: Education: Goal: Ability to state activities that reduce stress will improve Outcome: Progressing   Problem: Coping: Goal: Ability to identify and develop effective coping behavior will improve Outcome: Progressing   Problem: Self-Concept: Goal: Ability to identify factors that promote anxiety will improve Outcome: Progressing Goal: Level of anxiety will decrease Outcome: Progressing Goal: Ability to modify response to factors that promote anxiety will improve Outcome: Progressing

## 2022-03-13 NOTE — Progress Notes (Signed)
On morning assessment, [name] honeycomb incision on her back was saturated and leaking onto the pad. Patient did have an ABD pad from previous reinforcement that was bloody as well. I removed the honeycomb dressing to inspect incision for active bleeding. Covered with a pressure ABD pad.  I spoke with Dr. Louanne Skye regarding the dressing, as well as pain/anxiety management.  Dr. Louanne Skye gave verbal order to discontinue morphine q2 hrs and replaced with Dilaudid 1mg  IV q 2 hrs as needed for breakthrough pain.

## 2022-03-14 ENCOUNTER — Telehealth: Payer: Self-pay

## 2022-03-14 MED ORDER — DOCUSATE SODIUM 100 MG PO CAPS: 100.0000 mg | ORAL_CAPSULE | Freq: Two times a day (BID) | ORAL | 0 refills | Status: DC

## 2022-03-14 MED ORDER — OXYCODONE HCL 10 MG PO TABS: 10.0000 mg | ORAL_TABLET | ORAL | 0 refills | Status: DC | PRN

## 2022-03-14 MED ORDER — POLYETHYLENE GLYCOL 3350 17 G PO PACK: 17.0000 g | PACK | Freq: Every day | ORAL | 0 refills | Status: DC | PRN

## 2022-03-14 MED ORDER — NALOXONE HCL 4 MG/0.1ML NA LIQD: NASAL | 1 refills | Status: DC

## 2022-03-14 MED ORDER — OXYCODONE HCL ER 30 MG PO T12A: 30.0000 mg | EXTENDED_RELEASE_TABLET | Freq: Two times a day (BID) | ORAL | 0 refills | Status: DC

## 2022-03-14 MED ORDER — METHOCARBAMOL 500 MG PO TABS: 500.0000 mg | ORAL_TABLET | Freq: Four times a day (QID) | ORAL | 1 refills | Status: DC | PRN

## 2022-03-14 MED ORDER — ASPIRIN 81 MG PO TBEC: 81.0000 mg | DELAYED_RELEASE_TABLET | Freq: Every day | ORAL | 12 refills | Status: DC

## 2022-03-14 MED ORDER — GABAPENTIN 100 MG PO CAPS
100.0000 mg | ORAL_CAPSULE | Freq: Two times a day (BID) | ORAL | Status: DC
  Administered 2022-03-14 – 2022-03-15 (×2): 100 mg via ORAL
  Filled 2022-03-14 (×2): qty 1

## 2022-03-14 NOTE — Progress Notes (Signed)
Occupational Therapy Treatment Patient Details Name: [name] MRN: [id number] DOB: July 01, 1956 Today's Date: 03/14/2022   History of present illness Pt is a 66 y/o female admitted for L2-4 fusion and replacement of L4-5 rods (from previous fusion) in setting of lumbar spondylosis at L2-3 and L3-4 levels. PMH: L4-5 fusion in 2019, hepatitis C, GERD, seizures, cardiac cath, carpal tunnel syndrome.   OT comments  Compared to previous OT session, pt with improvements in mobility with Min A at most for RW use during bathroom mobility. However, pt requires max encouragement to attempt even basic self care tasks (toileting hygiene, brace mgmt, pulling up socks) and often moans when asked to attempt tasks prior to assist being given. Coordinated with RN for pain premedication prior to session though pt consistently asking for medications during session. Overall, continues to require extensive assist for ADLs and pt reports her family can provide assist with the tasks mentioned above. [facility] and consistent family support.    Recommendations for follow up therapy are one component of a multi-disciplinary discharge planning process, led by the attending physician.  Recommendations may be updated based on patient status, additional functional criteria and insurance authorization.    Follow Up Recommendations  Home health OT    Assistance Recommended at Discharge Frequent or constant Supervision/Assistance  Patient can return home with the following  A little help with walking and/or transfers;A lot of help with bathing/dressing/bathroom;Assistance with cooking/housework;Direct supervision/assist for financial management;Direct supervision/assist for medications management;Assist for transportation;Help with stairs or ramp for entrance   Equipment Recommendations  BSC/3in1;Other (comment) (RW)    Recommendations for Other Services      Precautions / Restrictions  Precautions Precautions: Fall;Back Precaution Booklet Issued: Yes (comment) Required Braces or Orthoses: Spinal Brace Spinal Brace: Thoracolumbosacral orthotic;Applied in sitting position Restrictions Weight Bearing Restrictions: No       Mobility Bed Mobility Overal bed mobility: Needs Assistance Bed Mobility: Supine to Sit, Sit to Supine     Supine to sit: Min assist, HOB elevated   Sit to sidelying: Supervision      Transfers Overall transfer level: Needs assistance Equipment used: Rolling walker (2 wheels) Transfers: Sit to/from Stand Sit to Stand: Min guard           General transfer comment: able to stand without assist using RW, requires cues to keep eyes open     Balance Overall balance assessment: Needs assistance Sitting-balance support: No upper extremity supported, Feet supported Sitting balance-Leahy Scale: Fair     Standing balance support: Bilateral upper extremity supported, During functional activity Standing balance-Leahy Scale: Poor                             ADL either performed or assessed with clinical judgement   ADL Overall ADL's : Needs assistance/impaired                 Upper Body Dressing : Maximal assistance;Sitting Upper Body Dressing Details (indicate cue type and reason): Very minimal effort put forth to manage TLSO brace when attempted to educate. pt reports wearing a brace like this before and knew how to manage it (noted new braces since pt's 2019 sx) Lower Body Dressing: Maximal assistance;Sit to/from stand Lower Body Dressing Details (indicate cue type and reason): reports unable to cross LEs, max encouragement needed for pt to attempt Toilet Transfer: Minimal assistance;Ambulation;Rolling walker (2 wheels) Toilet Transfer Details (indicate cue type and reason):  initial light assist to manuever RW to bathroom, had to cue pt to open eyes while walking Toileting- Clothing Manipulation and Hygiene: Minimal  assistance;Sit to/from stand Toileting - Clothing Manipulation Details (indicate cue type and reason): assist for clothing mgmt, max encouragement to attempt anterior hygiene after urinating with pt briefly attempting and then exclaiming"its fine, forget it"     Functional mobility during ADLs: Minimal assistance;Rolling walker (2 wheels) General ADL Comments: Primarily limited by behaviors, requires max encouragement to participate    Extremity/Trunk Assessment Upper Extremity Assessment Upper Extremity Assessment: Overall WFL for tasks assessed   Lower Extremity Assessment Lower Extremity Assessment: Defer to PT evaluation        Vision   Vision Assessment?: No apparent visual deficits   Perception     Praxis      Cognition Arousal/Alertness: Awake/alert Behavior During Therapy: Anxious, Restless Overall Cognitive Status: No family/caregiver present to determine baseline cognitive functioning                                 General Comments: emotionally labile, crying and moaning throughout session and agitated when asked to attempt basic ADLs. Pt asking for dilaudid by name and reports she gets it every hour. very poor insight, very self limiting        Exercises      Shoulder Instructions       General Comments      Pertinent Vitals/ Pain       Pain Assessment Pain Assessment: 0-10 Pain Score: 7  Pain Location: back Pain Descriptors / Indicators: Crying, Moaning, Guarding, Grimacing Pain Intervention(s): Monitored during session, Premedicated before session, Repositioned  Home Living                                          Prior Functioning/Environment              Frequency  Min 2X/week        Progress Toward Goals  OT Goals(current goals can now be found in the care plan section)  Progress towards OT goals: Progressing toward goals  Acute Rehab OT Goals Patient Stated Goal: go back to bed OT Goal  Formulation: With patient Time For Goal Achievement: 03/27/22 Potential to Achieve Goals: Good ADL Goals Pt Will Perform Lower Body Bathing: with supervision;sitting/lateral leans;sit to/from stand Pt Will Transfer to Toilet: ambulating;with min guard assist Pt Will Perform Toileting - Clothing Manipulation and hygiene: with supervision;sit to/from stand;sitting/lateral leans Additional ADL Goal #1: Pt to implement 3/3 back precautions during ADLs/mobility without verbal cues  Plan Discharge plan remains appropriate    Co-evaluation                 AM-PAC OT "6 Clicks" Daily Activity     Outcome Measure   Help from another person eating meals?: A Little Help from another person taking care of personal grooming?: A Little Help from another person toileting, which includes using toliet, bedpan, or urinal?: A Little Help from another person bathing (including washing, rinsing, drying)?: A Lot Help from another person to put on and taking off regular upper body clothing?: A Lot Help from another person to put on and taking off regular lower body clothing?: A Lot 6 Click Score: 15    End of Session Equipment Utilized During Treatment: Gait belt;Rolling walker (  2 wheels);Back brace  OT Visit Diagnosis: Unsteadiness on feet (R26.81);Other abnormalities of gait and mobility (R26.89)   Activity Tolerance Patient limited by pain;Other (comment) (very self limiting)   Patient Left in bed;with call bell/phone within reach;with bed alarm set   Nurse Communication Mobility status;Patient requests pain meds        Time: 5176-1607 OT Time Calculation (min): 29 min  Charges: OT General Charges $OT Visit: 1 Visit OT Treatments $Self Care/Home Management : 23-37 mins  Bradd Canary, OTR/L Acute Rehab Services Office: [phone]   Lorre Munroe 03/14/2022, 12:29 PM

## 2022-03-14 NOTE — Plan of Care (Signed)
Patient with significant c/o pain, asking for pain meds more frequently but drifts back to asleep when attempting to give meds. Surgical dressing changed once with gauze and transparent dressing as it still leaks when pt rolls on bed frequently.  Problem: Health Behavior/Discharge Planning: Goal: Ability to manage health-related needs will improve Outcome: Progressing   Problem: Activity: Goal: Risk for activity intolerance will decrease Outcome: Progressing   Problem: Coping: Goal: Level of anxiety will decrease Outcome: Progressing   Problem: Safety: Goal: Ability to remain free from injury will improve Outcome: Progressing

## 2022-03-14 NOTE — Progress Notes (Signed)
     Subjective: 2 Days Post-Op Procedure(s) (LRB): LEFT L2-3 AND L3-4 TRANSFORAMINAL LUMBAR INTERBODY FUSION WITH PEDICLE SCREWS, RODS AND CAGES, REPLACEMENT OF RODS L4-5 WITH EXTENSION TO L2-3, LOCAL AND ALLOGRAFT BONE GRAFT, VIVIGEN (N/A) Complaints of severe pain though she is somulent, " I have a high pain tolerance" I think she is taking adequate amounts of narcotics. She is also on ativan and increased gabapentin. Will Plan to stop ativan and decrease gabapentin to 100 mg bid. She has been able to progress well with PT. Pain control has been the difficult part of her care so far.  No BM. Taking pos and po narcotics.   Patient reports pain as marked.    Objective:   VITALS:  Temp:  [98.3 F (36.8 C)-98.9 F (37.2 C)] 98.5 F (36.9 C) (06/01 1159) Pulse Rate:  [65-79] 79 (06/01 1159) Resp:  [17-18] 18 (06/01 1159) BP: (130-158)/(52-79) 152/79 (06/01 1159) SpO2:  [94 %-97 %] 97 % (06/01 1159)  Neurologically intact ABD soft Neurovascular intact Sensation intact distally Intact pulses distally Dorsiflexion/Plantar flexion intact Incision: scant drainage and New mepilex with a opsite and tincture of benzoin as she is rolling No cellulitis present Compartment soft   LABS Recent Labs    03/13/22 0326  HGB 11.3*  WBC 17.1*  PLT 320   Recent Labs    03/13/22 0326  NA 138  K 3.7  CL 107  CO2 27  BUN 6*  CREATININE 0.75  GLUCOSE 118*   No results for input(s): LABPT, INR in the last 72 hours.   Assessment/Plan: 2 Days Post-Op Procedure(s) (LRB): LEFT L2-3 AND L3-4 TRANSFORAMINAL LUMBAR INTERBODY FUSION WITH PEDICLE SCREWS, RODS AND CAGES, REPLACEMENT OF RODS L4-5 WITH EXTENSION TO L2-3, LOCAL AND ALLOGRAFT BONE GRAFT, VIVIGEN (N/A) Opioid tolerant patient. Sedated with current meds, stopping synergistic meds ativan and decreasing gabapentin. Hopefully she will be adjusted and be ready for discharge in the AM.  Advance diet Up with therapy Plan for discharge  tomorrow  Vira Browns 03/14/2022, 12:17 PM Patient ID: [name], female   DOB: 1955-12-18, 66 y.o.   MRN: [id number]

## 2022-03-14 NOTE — Progress Notes (Signed)
Mobility Specialist Progress Note   03/14/22 1307  Mobility  Activity Transferred from chair to bed  Level of Assistance Contact guard assist, steadying assist  Assistive Device Front wheel walker  RLE Weight Bearing WBAT  LLE Weight Bearing WBAT  Distance Ambulated (ft) 6 ft  Activity Response Tolerated well  $Mobility charge 1 Mobility   Received in chair trying to eagerly get to bed by thereself. Presenting impulsively requiring heavy VC for sequencing and hand placement. Tx'd w/o fault and reiterate precautions while in bed. Left call bell in reach.    Frederico Hamman Mobility Specialist Phone Number [phone]

## 2022-03-14 NOTE — Progress Notes (Signed)
Physical Therapy Treatment Patient Details Name: [name] MRN: [id number] DOB: 10-24-1955 Today's Date: 03/14/2022   History of Present Illness Pt is a 66 y/o female admitted for L2-4 fusion and replacement of L4-5 rods (from previous fusion) in setting of lumbar spondylosis at L2-3 and L3-4 levels. PMH: L4-5 fusion in 2019, hepatitis C, GERD, seizures, cardiac cath, carpal tunnel syndrome.    PT Comments    Patient making steady progress with mobility. Pt requires Max encouragement due to pain but agreeable to ambulate after pain medication. Pt required Min assist to complete bed mobility and transfers and gait with RW. She amb ~225' with min assist, chair follow for safety and seated rest provided halfway due to back pain. EOS pt remained up in recliner and educated on importance of OOB activity for improved tolerance and to keep lungs clear. Continue to recommend HHPT with assist from family. Will progress as able in acute setting.    Recommendations for follow up therapy are one component of a multi-disciplinary discharge planning process, led by the attending physician.  Recommendations may be updated based on patient status, additional functional criteria and insurance authorization.  Follow Up Recommendations  Home health PT     Assistance Recommended at Discharge Intermittent Supervision/Assistance  Patient can return home with the following A little help with walking and/or transfers;A little help with bathing/dressing/bathroom;Help with stairs or ramp for entrance;Direct supervision/assist for medications management;Assistance with cooking/housework   Equipment Recommendations  Rolling walker (2 wheels);BSC/3in1    Recommendations for Other Services       Precautions / Restrictions Precautions Precautions: Fall;Back Precaution Booklet Issued: Yes (comment) Required Braces or Orthoses: Spinal Brace Spinal Brace: Thoracolumbosacral orthotic;Applied in sitting  position Restrictions Weight Bearing Restrictions: No RLE Weight Bearing: Weight bearing as tolerated LLE Weight Bearing: Weight bearing as tolerated     Mobility  Bed Mobility Overal bed mobility: Needs Assistance Bed Mobility: Sidelying to Sit   Sidelying to sit: Min assist       General bed mobility comments: Pt in Lt sidelying at start of session. Min assist to bring trunk upright, pt initiated bringing LE's off EOB to begin sitting upright.    Transfers Overall transfer level: Needs assistance Equipment used: Rolling walker (2 wheels) Transfers: Sit to/from Stand Sit to Stand: Min guard           General transfer comment: able to stand without assist using RW, requires cues to keep eyes open    Ambulation/Gait Ambulation/Gait assistance: Min assist, +2 safety/equipment (chair follow) Gait Distance (Feet): 225 Feet Assistive device: Rolling walker (2 wheels) Gait Pattern/deviations: Step-through pattern, Decreased stride length, Trunk flexed, Narrow base of support Gait velocity: decr     General Gait Details: Pt unsteady with heavy reliance on RW for support. Min assist throughout with chair follow for safety. despite c/o pain pt able to ambualte greater distance.   Stairs             Wheelchair Mobility    Modified Rankin (Stroke Patients Only)       Balance Overall balance assessment: Needs assistance Sitting-balance support: No upper extremity supported, Feet supported Sitting balance-Leahy Scale: Fair     Standing balance support: Bilateral upper extremity supported, During functional activity Standing balance-Leahy Scale: Poor                              Cognition Arousal/Alertness: Lethargic, Suspect due to medications Behavior During Therapy:  Anxious, Restless Overall Cognitive Status: Difficult to assess                                 General Comments: pt with flat affect and lethargic but became more  alert with mobility. Pt seems depressed by pain and willing to sit up in recliner EOS but not happy.        Exercises      General Comments        Pertinent Vitals/Pain Pain Assessment Pain Assessment: Faces Faces Pain Scale: Hurts even more Pain Location: back Pain Descriptors / Indicators: Crying, Moaning, Guarding, Grimacing Pain Intervention(s): Limited activity within patient's tolerance, Monitored during session, Repositioned, Premedicated before session    Home Living                          Prior Function            PT Goals (current goals can now be found in the care plan section) Acute Rehab PT Goals Patient Stated Goal: to go home PT Goal Formulation: With patient Time For Goal Achievement: 03/27/22 Potential to Achieve Goals: Good Progress towards PT goals: Progressing toward goals    Frequency    Min 5X/week      PT Plan Current plan remains appropriate    Co-evaluation              AM-PAC PT "6 Clicks" Mobility   Outcome Measure  Help needed turning from your back to your side while in a flat bed without using bedrails?: A Little Help needed moving from lying on your back to sitting on the side of a flat bed without using bedrails?: A Little Help needed moving to and from a bed to a chair (including a wheelchair)?: A Little Help needed standing up from a chair using your arms (e.g., wheelchair or bedside chair)?: A Little Help needed to walk in hospital room?: A Little Help needed climbing 3-5 steps with a railing? : A Lot 6 Click Score: 17    End of Session Equipment Utilized During Treatment: Gait belt;Back brace Activity Tolerance: Patient tolerated treatment well;Patient limited by lethargy Patient left: in chair;with call bell/phone within reach;with chair alarm set;with family/visitor present Nurse Communication: Mobility status PT Visit Diagnosis: Unsteadiness on feet (R26.81);Muscle weakness (generalized)  (M62.81);Pain     Time: 1209-1233 PT Time Calculation (min) (ACUTE ONLY): 24 min  Charges:  $Gait Training: 8-22 mins $Therapeutic Activity: 8-22 mins                     Wynn Maudlin, DPT Acute Rehabilitation Services Office [phone] Pager [phone]  03/14/22 4:20 PM

## 2022-03-14 NOTE — Discharge Instructions (Signed)
    Call if there is increasing drainage, fever greater than 101.5, severe head aches, and worsening nausea or light sensitivity. If shortness of breath, bloody cough or chest tightness or pain go to an emergency room. No lifting greater than 10 lbs. Avoid bending, stooping and twisting. Use brace when sitting and out of bed even to go to bathroom. Walk in house for first 2 weeks then may start to get out slowly increasing distances up to one mile by 4-6 weeks post op. After 5 days may shower and change dressing following bathing with shower.When bathing remove the brace shower and replace brace before getting out of the shower. If drainage, keep dry dressing and do not bathe the incision, use an moisture impervious dressing. Please call and return for scheduled follow up appointment 2 weeks from the time of surgery. You are taking a large amount of opioid medications, this is a controlled substance and causes respiratory depression, overdose can kill A person due to lack of oxygen due to respiratory ceasation. I am prescribing a narcan antidote to be used if you feel or you care assisting You sees that you are showing signs of respiratory stopping or short of breath. Narcan will stop the affect of the opioid and breathing usually  I helped, It will also stop the pain control that the narcotic provides so use as needed if there is an emergency. Also call 911 if there is concern About shortness of breath or overdose.

## 2022-03-14 NOTE — Telephone Encounter (Signed)
[name] with [facility] would like verification on Rx's for Oxycodone.  Cb# [phone].  Please advise.  Thank you

## 2022-03-15 ENCOUNTER — Telehealth: Payer: Self-pay | Admitting: Specialist

## 2022-03-15 ENCOUNTER — Other Ambulatory Visit: Payer: Self-pay | Admitting: Surgery

## 2022-03-15 MED ORDER — OXYCODONE HCL ER 30 MG PO T12A: 30.0000 mg | EXTENDED_RELEASE_TABLET | Freq: Two times a day (BID) | ORAL | 0 refills | Status: DC

## 2022-03-15 MED ORDER — OXYCODONE HCL 10 MG PO TABS: 10.0000 mg | ORAL_TABLET | ORAL | 0 refills | Status: DC | PRN

## 2022-03-15 MED ORDER — NALOXONE HCL 4 MG/0.1ML NA LIQD: NASAL | 1 refills | Status: DC

## 2022-03-15 MED FILL — Sodium Chloride IV Soln 0.9%: INTRAVENOUS | Qty: 2000 | Status: AC

## 2022-03-15 MED FILL — Heparin Sodium (Porcine) Inj 1000 Unit/ML: INTRAMUSCULAR | Qty: 30 | Status: AC

## 2022-03-15 NOTE — Telephone Encounter (Signed)
Pt called requesting her pain medication be sent to [facility] on [address]. Pt states [facility] states they can not get safe open that they have their opoid meds in. Please call pt about this matter at [phone].

## 2022-03-15 NOTE — Telephone Encounter (Signed)
I called and advised [name] that meds were sent to [facility] on [address]

## 2022-03-15 NOTE — Telephone Encounter (Signed)
Per Molly Maduro @ [facility] rx's cancelled

## 2022-03-15 NOTE — TOC Transition Note (Signed)
Transition of Care [facility]) - CM/SW Discharge Note   Patient Details  Name: [name] MRN: [id number] Date of Birth: 06-06-1956  [facility] CM/SW Contact:  Sharin Mons, RN Phone Number: 03/15/2022, 10:19 AM   Clinical Narrative:    Patient will DC to: home Anticipated DC date: 03/15/2022 Family notified: yes Transport by: car   Per MD patient ready for DC today. RN, patient, patient's and [facility]  notified of DC. DME @ bedside to go home with pt. Post hospital f/u noted on AVS. Pt without  Rx MED concerns.  Son to provide transportation to home.   RNCM will sign off for now as intervention is no longer needed. Please consult [place] again if new needs arise.    Final next level of care: [place] Barriers to Discharge: No Barriers Identified   Patient Goals and CMS Choice     Choice offered to / list presented to : Patient  Discharge Placement                       Discharge Plan and Services   Discharge Planning Services: CM Consult            DME Arranged: 3-N-1, Walker rolling DME Agency: [employer] Date DME Agency Contacted: 03/13/22 Time DME Agency Contacted: [phone] Representative spoke with at DME Agency: [place]: PT [place]: [place] Date [place]: 03/13/22 Time [place]: 1637 Representative spoke with at [place]: Tommi Rumps  Social Determinants of Health (Interior) Interventions     Readmission Risk Interventions     View : No data to display.

## 2022-03-15 NOTE — Progress Notes (Signed)
Physical Therapy Treatment Patient Details Name: [name] MRN: [id number] DOB: May 07, 1956 Today's Date: 03/15/2022   History of Present Illness Pt is a 67 y/o female admitted for L2-4 fusion and replacement of L4-5 rods (from previous fusion) in setting of lumbar spondylosis at L2-3 and L3-4 levels. PMH: L4-5 fusion in 2019, hepatitis C, GERD, seizures, cardiac cath, carpal tunnel syndrome.    PT Comments    Pt premedicated prior to session. Stating she wants to 'get this over with' so she can go home and self medicate. She required supervision bed mobility, min guard assist transfers, and min guard assist ambulation 150' with RW. Assist required to don/doff TLSO EOB. Cues needed to adhere to back precautions. Pt declining recliner due to pain and returned to bed at end of session.    Recommendations for follow up therapy are one component of a multi-disciplinary discharge planning process, led by the attending physician.  Recommendations may be updated based on patient status, additional functional criteria and insurance authorization.  Follow Up Recommendations  Home health PT     Assistance Recommended at Discharge    Patient can return home with the following A little help with walking and/or transfers;A little help with bathing/dressing/bathroom;Help with stairs or ramp for entrance;Direct supervision/assist for medications management;Assistance with cooking/housework   Equipment Recommendations  Rolling walker (2 wheels);BSC/3in1    Recommendations for Other Services       Precautions / Restrictions Precautions Precautions: Fall;Back Precaution Comments: reviewed 3/3 back precautions Required Braces or Orthoses: Spinal Brace Spinal Brace: Thoracolumbosacral orthotic;Applied in sitting position Restrictions Weight Bearing Restrictions: No RLE Weight Bearing: Weight bearing as tolerated LLE Weight Bearing: Weight bearing as tolerated     Mobility  Bed Mobility Overal  bed mobility: Needs Assistance Bed Mobility: Sidelying to Sit, Sit to Sidelying, Rolling Rolling: Modified independent (Device/Increase time) Sidelying to sit: Supervision, HOB elevated     Sit to sidelying: Supervision, HOB elevated General bed mobility comments: cues for precautions    Transfers Overall transfer level: Needs assistance Equipment used: Rolling walker (2 wheels) Transfers: Sit to/from Stand Sit to Stand: Min guard           General transfer comment: assist for safety    Ambulation/Gait Ambulation/Gait assistance: Min guard Gait Distance (Feet): 150 Feet Assistive device: Rolling walker (2 wheels) Gait Pattern/deviations: Step-through pattern, Trunk flexed, Decreased stride length Gait velocity: decreased Gait velocity interpretation: <1.31 ft/sec, indicative of household ambulator   General Gait Details: distance limited by pain   Stairs             Wheelchair Mobility    Modified Rankin (Stroke Patients Only)       Balance Overall balance assessment: Needs assistance Sitting-balance support: No upper extremity supported, Feet supported Sitting balance-Leahy Scale: Good     Standing balance support: Bilateral upper extremity supported, During functional activity, Reliant on assistive device for balance Standing balance-Leahy Scale: Poor                              Cognition Arousal/Alertness: Awake/alert Behavior During Therapy: Anxious, Flat affect Overall Cognitive Status: Difficult to assess                                 General Comments: irritated with attempts at cognitive assessment        Exercises      General  Comments General comments (skin integrity, edema, etc.): assist required to don/doff TLSO EOB      Pertinent Vitals/Pain Pain Assessment Pain Assessment: 0-10 Pain Score: 8  Pain Location: back Pain Descriptors / Indicators: Grimacing, Operative site guarding, Discomfort Pain  Intervention(s): Limited activity within patient's tolerance, Monitored during session, Repositioned, Premedicated before session    Home Living                          Prior Function            PT Goals (current goals can now be found in the care plan section) Acute Rehab PT Goals Patient Stated Goal: home Progress towards PT goals: Progressing toward goals    Frequency    Min 5X/week      PT Plan Current plan remains appropriate    Co-evaluation              AM-PAC PT "6 Clicks" Mobility   Outcome Measure  Help needed turning from your back to your side while in a flat bed without using bedrails?: None Help needed moving from lying on your back to sitting on the side of a flat bed without using bedrails?: A Little Help needed moving to and from a bed to a chair (including a wheelchair)?: A Little Help needed standing up from a chair using your arms (e.g., wheelchair or bedside chair)?: A Little Help needed to walk in hospital room?: A Little Help needed climbing 3-5 steps with a railing? : A Lot 6 Click Score: 18    End of Session Equipment Utilized During Treatment: Gait belt;Back brace Activity Tolerance: Patient tolerated treatment well Patient left: in bed;with call bell/phone within reach Nurse Communication: Mobility status PT Visit Diagnosis: Unsteadiness on feet (R26.81);Muscle weakness (generalized) (M62.81);Pain     Time: 2703-5009 PT Time Calculation (min) (ACUTE ONLY): 17 min  Charges:  $Gait Training: 8-22 mins                     Aida Raider, [place]  Office # [phone] Pager [phone]    Ilda Foil 03/15/2022, 10:16 AM

## 2022-03-15 NOTE — Telephone Encounter (Signed)
I called and advised pt was postop and this is what he wants her to be on temporarily as she is recovering---According to Molly Maduro the safe for her meds is locked and they can not get in to it. Asked that we send her pain meds somewhere else. Sent this info to [place] and verbally told him about this

## 2022-03-15 NOTE — Telephone Encounter (Signed)
Patient called. Her pharmacy does not have the oxycodone. Would like it called in to [facility] on [address]. Her call back number is [phone]

## 2022-03-16 DIAGNOSIS — M4156 Other secondary scoliosis, lumbar region: Secondary | ICD-10-CM

## 2022-03-16 NOTE — Op Note (Addendum)
03/12/2022  7:07 AM  PATIENT:  [name]  66 y.o. female  MRN: [id number]  OPERATIVE REPORT  PRE-OPERATIVE DIAGNOSIS:  Lumbar spondylosis  L2-3 and L3-4 with lumbar spondylolisthesis L2-3 above previous fusion L4-5, thoracolumbar scoliosis  POST-OPERATIVE DIAGNOSIS:  Lumbar spondylosis  L2-3 and L3-4 with lumbar spondylolisthesis L2-3 above previous fusion L4-5, thoracolumbar scoliosis  PROCEDURE:  Procedure(s): LEFT L2-3 AND L3-4 TRANSFORAMINAL LUMBAR INTERBODY FUSION WITH PEDICLE SCREWS, RODS AND CAGES, REPLACEMENT OF RODS L4-5 WITH EXTENSION TO L2-3, LOCAL AND ALLOGRAFT BONE GRAFT, VIVIGEN    SURGEON:  Jessy Oto, MD     ASSISTANT:  Benjiman Core, PA-C  (Present throughout the entire procedure and necessary for completion of procedure in a timely manner)     ANESTHESIA:  General, supplemented with local marcaine 0.5% 1:1 exparel 1.3% total 20cc Nunzio Cobbs M, DO    COMPLICATIONS:  None.   EBL : 500CC  CELL SAVER BLOOD RETURNED: 250CC    COMPONENTS: Implant Name Type Inv. Item Serial No. Manufacturer Lot No. LRB No. Used Action  GRAFT BONE CANNULA VIVIGEN 3 - [id number] Bone Implant GRAFT BONE CANNULA VIVIGEN 3 [id number] [employer]  N/A 1 Implanted  GRAFT BONE CANNULA VIVIGEN 3 - [id number] Bone Implant GRAFT BONE CANNULA VIVIGEN 3 [id number] [employer]  N/A 1 Implanted  GRAFT BONE CANNULA VIVIGEN 3 - [id number] Bone Implant GRAFT BONE CANNULA VIVIGEN 3 [id number] [employer]  N/A 1 Implanted  GRAFT BONE CANNULA VIVIGEN 3 - [id number] Bone Implant GRAFT BONE CANNULA VIVIGEN 3 [id number] [employer]  N/A 1 Implanted  GRAFT BONE CANNULA VIVIGEN 3 - [id number] Bone Implant GRAFT BONE CANNULA VIVIGEN 3 [id number] [employer]  N/A 1 Implanted  GRAFT BONE CANNULA VIVIGEN 3 - [id number] Bone Implant GRAFT BONE CANNULA VIVIGEN 3 [id number] [employer]  N/A 1 Implanted  SCREW CORT FIX FEN 5.5X6X45MM - [id number] Screw  SCREW CORT FIX FEN 5.5X6X45MM  [employer]  N/A 4 Implanted  SCREW VIPER 7X45MM - [id number] Screw SCREW VIPER 7X45MM  [employer]  N/A 1 Implanted  SCREW SET SINGLE INNER - [id number] Screw SCREW SET SINGLE INNER  [employer]  N/A 8 Implanted  ROD EXPEDIUM PREBENT 85MM - [id number] Rod ROD EXPEDIUM PREBENT 85MM  [employer]  N/A 2 Implanted  CAGE LORD XPAC 10X25 - [id number] Cage CAGE LORD XPAC 10X25  [employer]  N/A 2 Implanted     PROCEDURE:    The patient was met in the holding area, and the appropriate Left L2-3 and L3-4lumbar levels identified and marked with an "X" and my initials. I had discussion with the patient in the preop holding area regarding consent form. Patient understands the rationale for the fusion site as the L3-4 and L2-3 segment For stenosis and degenerative spondylolisthesis with neurogenic claudication above previous fusion L4 to L5  The patient was then transported to OR and was placed under general anestheticwithout difficulty. The patient received appropriate preoperative antibiotics ancef and prophylaxis vancomycin for MSSA/MRSA nasal swab. Preop nasal betadiene nasal swab. Nursing staff inserted a Foley catheter under sterile conditions. The patient was then turned to a prone position using the [place] spine frame. PAS. all pressure points well padded the arms at the side to 90 90. Standard prep with DuraPrep solution draped in the usual manner from the lower dorsal spine the mid sacral segment. Iodine Vi-Drape was used and the incision was marked. Time-out procedure was called and correct.  Loupe magnification and headlight were used during this portion procedure.  Skin in the midline between L1 and L5 was then infiltrated with local marcaine 0.5% 1:1 exparel 1.3% total of 20 cc used. Incision was then made through the skin and subcutaneous layers down to the patient's lumbodorsal fascia and spinous  processes. The incision then carried sharply excising the supraspinous ligament and then continuing the lateral aspect of the spinous processes L1 and L2 and carried lateral over the lateral facets at L2-3, L3-4 and L4-L5.Belenda Cruise elevators used to carefully elevate the paralumbar muscles off of the posterior elements using electrocautery carefully drilled bleeding and perform dissection of the muscle tissues of the preserving the facet at L1-2. Continuing the exposure out laterally to expose the retained pedicle screws and rods at L4 and  L5. Bleeding controlled using electrocautery monopolar electrocautery.  Viper retractor was used for the upper part of the incision.  C-arm fluoroscopy was then brought into the field and using C-arm fluoroscopy then a hole made into the lateral aspect of the left pedicle of L2 observed in the pedicle using ball-tipped nerve hook and hockey stick nerve probe initial entry was determined on fluoroscopy to be good position alignment so that a ball handled probe was then used to probe the left L2 pedicle to a depth of nearly 45 mm observed on C-arm fluoroscopy to be beyond the midpoint of the lumbar vertebra and then position alignment within the left L2 pedicle this was then removed and the pedicle channel probed demonstrating patency no sign of rupture the cortex of the pedicle. Tapping with a 4.35 mm screw ta[ , then 5 and 27m taps, then 6.0 mm x 45 mm screw was placed on the table for use st the left side at the L2 level following TLIF. C-arm fluoroscopy was then brought into the field and using C-arm fluoroscopy then a hole made into the lateral aspect of the right pedicle of L2 observed in the pedicle using ball tipped nerve hook and hockey stick nerve probe initial entry was determined on fluoroscopy to be good position alignment so that a ball handled probe was then used to probe the left L2 pedicle opening to a depth of nearly 45 mm observed on C-arm fluoroscopy to be beyond  the midpoint of the lumbar vertebra and then position alignment within the left L2 pedicle this was then removed and the pedicle channel probed demonstrating patency no sign of rupture the cortex of the pedicle. Tapping with a 4.35, then 5 mm and 6 mm screw taps then 6.0 mm x 45 mm screw was placed on the right side at the L2 level. C-arm fluoroscopy was then brought into the field and using C-arm fluoroscopy then a hole made into the lateral aspect of the left pedicle of L3 observed in the pedicle using ball tipped nerve hook and hockey stick nerve probe initial entry was determined on fluoroscopy to be good position alignment so that a ball handled probe was then used to probe the left L3 pedicle to a depth of nearly 45 mm observed on C-arm fluoroscopy to be well aligned within the left L3 pedicle, the pedicle channel probed demonstrating patency no sign of rupture the cortex of the pedicle. Tapping with 4.35, 5 and 6 mm screw taps then 6.0 mm x 45 mm screw was placed  on the table for use at the left side at the L3 level following TLIF. C-arm fluoroscopy was used to localize the hole made in  the lateral aspect of the pedicle of L3 on the right localizing the pedicle within the spinal canal with nerve hook and hockey-stick nerve probe carefully passed down the center of the L3 pedicle to a depth of nearly 45 mm. Observed on C-arm fluoroscopy to be in good position alignment channel was probed with a ball-tipped probe ensure patency no sign of cortical disruption. Following tapping with a 4.35 mm tap and a 1049m tap a 6.0 x 45 mm screw was placed  on the right side at the L3 level. The retained hardware at L4-5 then Exposed and the fastener caps removed after debriding of soft tissue attached to the  Rods and posterior instrumentation. The retained [id number] then removed. To allow for exposure for the left L3-4 TLIF the left retained L3 pedicle screw was removed And later replaced with a new 7.0 x 45 mm  corticotrajectory screw.  The left inferior articular process of L3 and L2 were resected in order to provide for exposure of the left side L3-4 and L2-3 neuroforamen for ease of placement of TLIFs (transforaminal lumbar interbody fusion) essential portions of the lamina were also resected first beginning with the Leksell rongeur and then resecting using 2 and 3 mm Kerrison the central and left portions of the lamina of L3 and L2 performing foraminotomies on the left side at the L3 level. The inferior articular process L3 and L2 were resected on the left side. The L4 nerve root identified at the medial aspect of the L4 pedicle. Superior articular process of L4 was then resected from the left side further decompressing the left L3 and L4 nerve roots and providing for exposure of the area just superior to the L4 pedicle for a placement of cage. A large disc protrusion was found within the left side neuroforamen at the L3-4 level and this was resected. The OR microscope was draped sterilely and brought into the field to allow for freeing up of the left side of the thecal sac at the L2-3 and L3-4 levels elevating the L3 nerve root away from the medial left L3 pedicle, exposing the left L2-3 disc above the left L3 pedicle, Incising the disc and then resecting the disc with micropituitary and then pituitary with teeth.The left side recut resecting the superior articular process of L3 overlying the L2 nerve root as it exited at the L2-3 level decompressing the lateral recess along the medial aspect of the pedicle of L3 and resecting the superior to the process of L3 and decompressing the left L2 neuroforamen. The posterior longitudinal ligament was incised and a blunt nerve hook used to explore the spinal canal and ensure Both the left L3 and L2 nerve roots were well decompressed. Foraminotomies was widely performed over the left L3 nerve and L2 nerve roots.  Attention then turned to placement of the left  transforaminal lumbar interbody fusion cages. Using a Penfield 4 the lateral aspect of the thecal sac and the inferior aspect of the L3 nerve root on the left side at the L2-3 level was carefully drilled. The thecal sac could then easily be retracted in the posterior lateral aspect of the L2-3 disc was exposed 15 blade scalpel used to incise the osteotome used to resect a small portion of bone off the superior aspect of the posterior superior vertebral body of L2 in order to ease the entry into the L2-3 disc space. A pituitary rongeur was then able to be introduced in the disc space debrided it of  degenerative disc material. 7 mm dilator was used to dialate  the L2-3 disc space on the left side attempts were made to dilate further in increments to 14m successfully and using shavers and small curettes and the disc space was debrided of degenerative disc present and the endplates exposed to bleeding endplate bone.Trial cage placement within the disc place could only allow for an 7-12 mm x 25 mm cage 10 mm lordotic adjustable Depuy XPAC cage was carefully packed with morcellized bone graft and the been harvested from previous laminotomies additional bone graft local morselized, cancellous allograft chips and vivigen was then packed into the intervertebral disc space using the 7 mm trial to impacted the graft multiple times. With this then a 7-12 mm x 25 mm lorditic cage was introdudced into the disc space on the left side in the correct degree convergence and then impacted then subset beneath the posterior aspect of the disc space by about 3 or 4 mm. Bleeding controlled using bipolar electrocautery thrombin soaked gel cottonoids. The cage then adjusted to a height of nearly 161mat the L2-3 level. The cage then filled via the handle with some vivigen but the graft appeared to hang up in the insertion cannula so that the graft insert gun was used quite successfully to fill the cage after removing the cage insertion  handle. Canal explored with microscope and any loose bone graft particulate removed form the neuroforamen and the canal.  Then turned to the left L3-4 level similarly the exposure the posterior lateral aspect this was carried out using a Penfield 4 bipolar electrocautery to control small bleeders present. Derricho retractor used to retract the thecal sac and nerve root, a 15 blade scalpel was used to incise posterior lateral aspect of the disc at the L3-4 disc space.The space was debrided of degenerative disc material using pituitary along root the entire disc space was then debrided of degenerative disc material using pituitary rongeurs curettage down to bleeding bone endplates. Residual disc was resected using pituitary. This space was then carefully sounded to a 9 mm and a second [id number] cage provided the best fit the 10 degree lordotic cage was chosen the 7-12 mm x 25 mm. The intervertebral disc space was then packed with autogenous local bone graft that been harvested from the central laminectomy, cancellous allograft chips and vivigen and the trial was used to pack the graft using an 7mm 56mal cage. This provided excellent bone graft within the intervertebral disc space at L3-4 so that the permanent 7-12 mm x 28mm 67m cage was then packed with local bone graft cancellous allograft chips and vivigen  placed into the intervertebral disc space and impacted into place in the correct degree of convergence. Bleeding controlled using bipolar electrocautery. The cage was subset beneath the posterior aspect of the about 3-4 mm. Cage was then deployed elevating the cage to about 10mm, 87mn vivigen graft was used to pack the cage this time howevere the insertion funnel via the insertion handle successfully was able to be used to accomplish the graft insertion. Again the canal explored for any residual graft that my be outside of the disc and any loose graft material outside of the disc space was removed.    Bleeding was hemostasis attention was turned to the placement of the rods and Rod sleeves . The exposed retained hardware bilateral L4 to L5 pedicle screw fasteners with removed rods. The corticotrajectory screws were places at left L2, L3 and replace with a new  screw left L4. Rods long enough to beplaced into the pedicle screw fasteners L2, L3, L4 and L5 were chosen. With the transforaminal lumbar interbody fusion portion of the case  completed bleeders were controlled using bipolar electrocautery thrombin-soaked Gelfoam were appropriate. 4 pedicle screws on the left and 4 Pedicle screws on the right and then each carefully aligned to allow for placement of rods. The 24m prebent rod was then contoured using a template on the left side and  placed into the pedicle screws on the left extending from L4-5 rod connector sleeve into each of the left L2 and L1 pedicle fastener and caps carefully placed loosely tightened. Attention turned to the right side were similarly screws were carefully adjusted to allow for a better pattern screws to allow for placement of fixation rod template for the rod was then taken and a precontoured quarter inch 822mtitanium rod was placed. The rod placed into the fastener saddles on the left side.  Both L4 and L5 screw caps were tightened to 80 foot lbs.  At the left side disc space at L3-4 screw fasteners compressed and the L2-3 disc compressed posteriorly and the L3 fastener cap was torqued to 85 foot lbs, the left side between L3 and L2 then compressed by loosening the cap at the L2 level, using the compressor and tightening the left L2 cap to 85 foot lbs.  Similarly this was done on the rightt side at L3-4 and L2-3 with compression and tightened 85 pounds. Irrigation was carried out with copious amounts of saline solution this was done throughout the case. Cell Saver was used during the case.Permanent C-arm images were obtained in AP and lateral and oblique planes. Remaining  local bone graft was then applied along the right lateral posterior lateral region extending from L2  through L4 posterior facets following decortication of the right L2-3 and L3-4 facets and along the right L2-4 interlaminar area posteriorly. Excess Gelfoam was then removed the lumbodorsal musculature carefully exam debrided of any devitalized tissue following removal of cerebellar retractors were the bleeders were controlled using electrocautery and the area dorsal lumbar muscle were then approximated in the midline with interrupted #1 Vicryl sutures loose the dorsal fascia was reattached to the spinous process of  L1, L2 and L3 to superiorly and  inferiorly this was done with #1 Vicryl sutures. The para lumbar muscle approximated over the lower incision site with #1 vicryl. The subcutaneous layers then approximated using interrupted 0 Vicryl sutures and 2-0 Vicryl sutures. Skin was closed with stainless steel staples then MedPlex bandage. All instrument and sponge counts were correct. The patient was then returned to a supine position on her bed reactivated extubated and returned to the recovery room in satisfactory condition. Cell saver blood was returned to this patient totally 250CC.   JaBenjiman CorePA-C perform the duties of assistant surgeon during this case. He was present from the beginning of the case to the end of the case assisting in transfer the patient from his stretcher to the OR table and back to the stretcher at the end of the case. Assisted in careful retraction and suction of the laminectomy site delicate neural structures operating under the operating room microscope. He performed closure of the incision from the fascia to the skin applying the dressing.      JaBasil Dess6/12/2021, 7:07 AM

## 2022-03-19 ENCOUNTER — Other Ambulatory Visit: Payer: Self-pay | Admitting: Surgery

## 2022-03-19 MED ORDER — OXYCODONE HCL 10 MG PO TABS: 10.0000 mg | ORAL_TABLET | ORAL | 0 refills | Status: DC | PRN

## 2022-03-19 NOTE — Telephone Encounter (Signed)
Pt called and was wondering if she could get a refill on her oxycodone. She states she still has some and its not time yet but nitka told her to call when she has a certain amount left.   CB [phone]

## 2022-03-19 NOTE — Addendum Note (Signed)
Addended by: Penne Lash, Otis Dials on: 03/19/2022 02:44 PM   Modules accepted: Orders

## 2022-03-20 ENCOUNTER — Telehealth: Payer: Self-pay | Admitting: Specialist

## 2022-03-20 NOTE — Telephone Encounter (Signed)
Pt called for refill on oxycodone   CB [phone]

## 2022-03-20 NOTE — Telephone Encounter (Signed)
Lmom that her rx was sent in @ 3:30 by Dr. Louanne Skye

## 2022-03-21 ENCOUNTER — Telehealth: Payer: Self-pay | Admitting: Specialist

## 2022-03-21 NOTE — Telephone Encounter (Signed)
Patient called advised she does not understand why Dr. Louanne Skye prescribed 5 mg Oxycodone after she had such a big surgery. Patient asked for a call back as soon as possible. The number to contact patient is [phone]

## 2022-03-22 ENCOUNTER — Telehealth: Payer: Self-pay | Admitting: Radiology

## 2022-03-22 NOTE — Telephone Encounter (Signed)
Printed and given to Dr. Otelia Sergeant

## 2022-03-22 NOTE — Telephone Encounter (Signed)
Pt is calling wanting to know why her medication was changed from the Oxy 30 mg, she knows that she needs to wean herself.  But she cant sleep and she Hurts.  Please call with questions [phone]

## 2022-03-27 ENCOUNTER — Telehealth: Payer: Self-pay | Admitting: Specialist

## 2022-03-27 NOTE — Telephone Encounter (Signed)
Pt calling about refilling her oxycodone 10s

## 2022-03-28 ENCOUNTER — Other Ambulatory Visit: Payer: Self-pay | Admitting: Specialist

## 2022-03-28 DIAGNOSIS — M4326 Fusion of spine, lumbar region: Secondary | ICD-10-CM

## 2022-03-28 MED ORDER — OXYCODONE ER 18 MG PO C12A: 1.0000 | EXTENDED_RELEASE_CAPSULE | Freq: Two times a day (BID) | ORAL | 0 refills | Status: AC

## 2022-03-28 NOTE — Telephone Encounter (Signed)
Pt called requesting refill of oxycodone. Please send to [facility] on [address]. Pt phone number is [phone].

## 2022-03-29 ENCOUNTER — Ambulatory Visit: Payer: Self-pay

## 2022-03-29 ENCOUNTER — Ambulatory Visit (INDEPENDENT_AMBULATORY_CARE_PROVIDER_SITE_OTHER): Payer: Medicare Other | Admitting: Surgery

## 2022-03-29 ENCOUNTER — Encounter: Payer: Self-pay | Admitting: Surgery

## 2022-03-29 VITALS — BP 193/101 | HR 70

## 2022-03-29 DIAGNOSIS — M4326 Fusion of spine, lumbar region: Secondary | ICD-10-CM

## 2022-03-29 MED ORDER — OXYCODONE HCL 10 MG PO TABS: 10.0000 mg | ORAL_TABLET | Freq: Four times a day (QID) | ORAL | 0 refills | Status: DC | PRN

## 2022-03-29 NOTE — Progress Notes (Signed)
Post-Op Visit Note   Patient: [name]           Date of Birth: Oct 29, 1955           MRN: [id number] Visit Date: 03/29/2022 PCP: Orpah Cobb, MD   Assessment & Plan:  Chief Complaint:  Chief Complaint  Patient presents with   Lower Back - Routine Post Op  66 year old female returns.  She is 2 weeks status post LEFT L2-3 AND L3-4 TRANSFORAMINAL LUMBAR INTERBODY FUSION WITH PEDICLE SCREWS, RODS AND CAGES, REPLACEMENT OF RODS L4-5 WITH EXTENSION TO L2-3, LOCAL AND ALLOGRAFT BONE GRAFT, VIVIGEN.  She has some tingling into her groin.  Preop leg pain is somewhat improved.  She did not pick up the last prescription that was sent in by Dr. Otelia Sergeant because she states that her insurance would not cover it and it was too expensive.  She is requesting prescription for oxycodone 10 mg as she has taken in the past.    Visit Diagnoses:  1. Fusion of lumbar spine     Plan: Today staples removed and Steri-Strips applied.  Patient advised he must continue wearing her brace.  Follow-up with Dr. Otelia Sergeant in 4 weeks for recheck.  I refilled oxycodone 10 mg every 6 hours as needed for pain number 40 tablets.  Follow-Up Instructions: Return in about 4 weeks (around 04/26/2022) for WITH DR NITKA FOR POSTOP CHECK.   Orders:  Orders Placed This Encounter  Procedures   XR Lumbar Spine 2-3 Views   Meds ordered this encounter  Medications   Oxycodone HCl 10 MG TABS    Sig: Take 1 tablet (10 mg total) by mouth every 6 (six) hours as needed ((score 7 to 10)).    Dispense:  40 tablet    Refill:  0    Imaging: No results found.  PMFS History: Patient Active Problem List   Diagnosis Date Noted   Other secondary scoliosis, lumbar region    Fusion of spine of lumbar region 03/12/2022   Acute coronary syndrome (HCC) 11/25/2018   Thrush 04/07/2018   Drug-induced skin rash 04/07/2018   Chronic hepatitis C without hepatic coma (HCC) 02/09/2018   Hardware complicating wound infection (HCC)  02/09/2018   MRSA infection 02/09/2018   Anemia due to blood loss, acute 01/16/2018    Class: Acute   Infection of lumbar spine (HCC) 01/16/2018    Class: Acute   Spinal stenosis of lumbar region 01/13/2018    Class: Chronic   Spondylolisthesis, lumbar region 01/13/2018    Class: Chronic   Fusion of lumbar spine 01/13/2018   Low back pain 12/23/2017   Tobacco use disorder 06/24/2016   Weight gain 01/02/2012   Stool incontinence 11/19/2011   Abdominal pain, right lower quadrant 11/19/2011   EUSTACHIAN TUBE DYSFUNCTION 08/10/2009   SINUSITIS, ACUTE 08/10/2009   TRANSIENT GLOBAL AMNESIA 01/12/2009   HIP PAIN, RIGHT 10/04/2008   HEPATITIS C 07/28/2008   COPD, 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   HYPERLIPIDEMIA, MIXED, WITH LOW HDL 08/30/2007   OTITIS EXTERNA, INFECTIVE NOS 08/27/2007   ROTATOR CUFF SYNDROME, RIGHT 08/27/2007   Chest pain at rest 08/27/2007   BURSITIS NOS 07/13/2007   MUSCULOSKELETAL PAIN 07/13/2007   CARPAL TUNNEL SYNDROME, BILATERAL 04/10/2007   Past Medical History:  Diagnosis Date   Acute hepatitis C without mention of hepatic coma(070.51)    Anemia    Arthritis    back   Carpal tunnel syndrome    Chest pain, unspecified    Chronic airway obstruction, not  elsewhere classified    Chronic hepatitis C without hepatic coma (HCC) 02/09/2018   Colitis    Coronary atherosclerosis of unspecified type of vessel, native or graft    Disorders of bursae and tendons in shoulder region, unspecified    Drug-induced skin rash 04/07/2018   GERD (gastroesophageal reflux disease)    Hardware complicating wound infection (HCC) 02/09/2018   Headache    migraines   Hepatitis    hx of hepatitis C   Infective otitis externa, unspecified    Mixed hyperlipidemia    MRSA infection 02/09/2018   Nonspecific abnormal results of thyroid function study    Other bursitis disorders    Other symptoms involving nervous and musculoskeletal systems(781.99)    Pneumonia    Seizures (HCC)    none for 4  years (as of 01/2018) "pseudo seizures"   Thrush 04/07/2018    Family History  Problem Relation Age of Onset   Crohn's disease Daughter 14       colon resection   Irritable bowel syndrome Daughter    Colonic polyp Mother    Thyroid disease Mother    COPD Mother    Uterine cancer Maternal Grandmother    Heart murmur Daughter        SVT   Colon cancer Neg Hx     Past Surgical History:  Procedure Laterality Date   ABDOMINAL HYSTERECTOMY     CARDIAC CATHETERIZATION     CARDIAC CATHETERIZATION N/A 06/25/2016   Procedure: Left Heart Cath and Coronary Angiography;  Surgeon: Orpah Cobb, MD;  Location: [facility];  Service: Cardiovascular;  Laterality: N/A;   CESAREAN SECTION     CHOLECYSTECTOMY     COLONOSCOPY     IR FLUORO GUIDED NEEDLE PLC ASPIRATION/INJECTION LOC  12/27/2017   LUMBAR FUSION  01/13/2018   Transforaminal lumbar interbody fusion L4-5 with screws, cages and rods, local bone graft, Allograft, Vivigen, Decompression L4-5 and L5-S1   TONSILLECTOMY     Social History   Occupational History   Occupation: care giver  Tobacco Use   Smoking status: Former    Types: Cigarettes    Quit date: 01/07/2018    Years since quitting: 4.2   Smokeless tobacco: Never   Tobacco comments:    2 cigarettes a day  Vaping Use   Vaping Use: Former   Quit date: 02/26/2022  Substance and Sexual Activity   Alcohol use: No   Drug use: No   Sexual activity: Yes    Partners: Male   Exam Wound looks good.  Staples removed and Steri-Strips applied.  No drainage or signs of infection.  She is ambulating well with walker.

## 2022-04-02 ENCOUNTER — Other Ambulatory Visit: Payer: Self-pay | Admitting: Specialist

## 2022-04-04 ENCOUNTER — Other Ambulatory Visit: Payer: Self-pay | Admitting: Specialist

## 2022-04-04 MED ORDER — OXYCODONE HCL 10 MG PO TABS: 10.0000 mg | ORAL_TABLET | Freq: Four times a day (QID) | ORAL | 0 refills | Status: DC | PRN

## 2022-04-04 NOTE — Telephone Encounter (Signed)
Pt called requesting a refill of oxycodone. Please send to [facility] on [address]. Pt phone number is [phone].

## 2022-04-17 ENCOUNTER — Other Ambulatory Visit: Payer: Self-pay | Admitting: Specialist

## 2022-04-18 ENCOUNTER — Telehealth: Payer: Self-pay | Admitting: Specialist

## 2022-04-18 ENCOUNTER — Other Ambulatory Visit: Payer: Self-pay | Admitting: Specialist

## 2022-04-18 MED ORDER — OXYCODONE HCL 10 MG PO TABS: 10.0000 mg | ORAL_TABLET | Freq: Four times a day (QID) | ORAL | 0 refills | Status: DC | PRN

## 2022-04-18 NOTE — Telephone Encounter (Signed)
Pt called requesting a refill of pain medication. Please send to [facility] on [address]. Pt phone number is [phone].

## 2022-04-18 NOTE — Telephone Encounter (Signed)
Duplicate request

## 2022-04-18 NOTE — Telephone Encounter (Signed)
done

## 2022-04-24 ENCOUNTER — Other Ambulatory Visit: Payer: Self-pay | Admitting: Specialist

## 2022-04-24 NOTE — Telephone Encounter (Signed)
Pt called requesting a refill of oxycodone. Please send to pharmacy on file. Pt phone number is [phone].

## 2022-04-24 NOTE — Discharge Summary (Signed)
Patient ID: PHYILLIS DASCOLI MRN: [id number] DOB/AGE: [birth year] 66 y.o.  Admit date: 03/12/2022 Discharge date: 03/15/2022  Admission Diagnoses:  Principal Problem:   Fusion of spine of lumbar region Active Problems:   Other secondary scoliosis, lumbar region   Discharge Diagnoses:  Principal Problem:   Fusion of spine of lumbar region Active Problems:   Other secondary scoliosis, lumbar region  status post Procedure(s): LEFT L2-3 AND L3-4 TRANSFORAMINAL LUMBAR INTERBODY FUSION WITH PEDICLE SCREWS, RODS AND CAGES, REPLACEMENT OF RODS L4-5 WITH EXTENSION TO L2-3, LOCAL AND ALLOGRAFT BONE GRAFT, VIVIGEN  Past Medical History:  Diagnosis Date   Acute hepatitis C without mention of hepatic coma(070.51)    Anemia    Arthritis    back   Carpal tunnel syndrome    Chest pain, unspecified    Chronic airway obstruction, not elsewhere classified    Chronic hepatitis C without hepatic coma (HCC) 02/09/2018   Colitis    Coronary atherosclerosis of unspecified type of vessel, native or graft    Disorders of bursae and tendons in shoulder region, unspecified    Drug-induced skin rash 04/07/2018   GERD (gastroesophageal reflux disease)    Hardware complicating wound infection (HCC) 02/09/2018   Headache    migraines   Hepatitis    hx of hepatitis C   Infective otitis externa, unspecified    Mixed hyperlipidemia    MRSA infection 02/09/2018   Nonspecific abnormal results of thyroid function study    Other bursitis disorders    Other symptoms involving nervous and musculoskeletal systems(781.99)    Pneumonia    Seizures (HCC)    none for 4 years (as of 01/2018) "pseudo seizures"   Thrush 04/07/2018    Surgeries: Procedure(s): LEFT L2-3 AND L3-4 TRANSFORAMINAL LUMBAR INTERBODY FUSION WITH PEDICLE SCREWS, RODS AND CAGES, REPLACEMENT OF RODS L4-5 WITH EXTENSION TO L2-3, LOCAL AND ALLOGRAFT BONE GRAFT, VIVIGEN on 03/12/2022   Consultants:   Discharged Condition: Improved  Hospital  Course: ALLIENE KLUGH is an 66 y.o. female who was admitted 03/12/2022 for operative treatment of Fusion of spine of lumbar region. Patient failed conservative treatments (please see the history and physical for the specifics) and had severe unremitting pain that affects sleep, daily activities and work/hobbies. After pre-op clearance, the patient was taken to the operating room on 03/12/2022 and underwent  Procedure(s): LEFT L2-3 AND L3-4 TRANSFORAMINAL LUMBAR INTERBODY FUSION WITH PEDICLE SCREWS, RODS AND CAGES, REPLACEMENT OF RODS L4-5 WITH EXTENSION TO L2-3, LOCAL AND ALLOGRAFT BONE GRAFT, VIVIGEN.    Patient was given perioperative antibiotics:  Anti-infectives (From admission, onward)    Start     Dose/Rate Route Frequency Ordered Stop   03/12/22 1700  ceFAZolin (ANCEF) IVPB 2g/100 mL premix       Note to Pharmacy: More broad spectrum than vancomycin so recommend continuing with Ancef or periop coverage, she in   2 g 200 mL/hr over 30 Minutes Intravenous Every 8 hours 03/12/22 1605 03/13/22 1028   03/12/22 0700  vancomycin (VANCOCIN) IVPB 1000 mg/200 mL premix        1,000 mg 200 mL/hr over 60 Minutes Intravenous  Once 03/12/22 0657 03/13/22 1028   03/12/22 0600  ceFAZolin (ANCEF) IVPB 2g/100 mL premix        2 g 200 mL/hr over 30 Minutes Intravenous On call to O.R. 03/12/22 0549 03/12/22 1310        Patient was given sequential compression devices and early ambulation to prevent DVT.   Patient benefited maximally  from hospital stay and there were no complications. At the time of discharge, the patient was urinating/moving their bowels without difficulty, tolerating a regular diet, pain is controlled with oral pain medications and they have been cleared by PT/OT.   Recent vital signs: No data found.   Recent laboratory studies: No results for input(s): "WBC", "HGB", "HCT", "PLT", "NA", "K", "CL", "CO2", "BUN", "CREATININE", "GLUCOSE", "INR", "CALCIUM" in the last 72 hours.  Invalid  input(s): "PT", "2"   Discharge Medications:   Allergies as of 03/15/2022       Reactions   Acetaminophen-codeine Other (See Comments)   Hand get red, feels like it's on fire   Morphine And Related    "makes me feel like i'm on fire"   Nitroglycerin    Causes a headache   Tramadol Hcl    Gives her the shakes   Azithromycin Itching, Rash        Medication List     STOP taking these medications    HYDROcodone-acetaminophen 10-325 MG tablet Commonly known as: NORCO   methylPREDNISolone 4 MG Tbpk tablet Commonly known as: MEDROL DOSEPAK   oxyCODONE 5 MG immediate release tablet Commonly known as: Oxy IR/ROXICODONE   traMADol 50 MG tablet Commonly known as: ULTRAM       TAKE these medications    amLODipine 2.5 MG tablet Commonly known as: NORVASC Take 1 tablet (2.5 mg total) by mouth daily.   aspirin EC 81 MG tablet Take 1 tablet (81 mg total) by mouth daily. Swallow whole. What changed: additional instructions   Aspirin-Caffeine 845-65 MG Pack Take 1 Package by mouth daily as needed (headache or pain).   atorvastatin 40 MG tablet Commonly known as: LIPITOR Take 1 tablet (40 mg total) by mouth daily at 6 PM.   docusate sodium 100 MG capsule Commonly known as: COLACE Take 1 capsule (100 mg total) by mouth 2 (two) times daily.   gabapentin 100 MG capsule Commonly known as: NEURONTIN Take 1 capsule (100 mg total) by mouth at bedtime.   methocarbamol 500 MG tablet Commonly known as: ROBAXIN Take 1 tablet (500 mg total) by mouth every 6 (six) hours as needed for muscle spasms.   metoprolol tartrate 25 MG tablet Commonly known as: LOPRESSOR Take 0.5 tablets (12.5 mg total) by mouth 2 (two) times daily.   nitroGLYCERIN 0.4 MG SL tablet Commonly known as: NITROSTAT Place 1 tablet (0.4 mg total) under the tongue every 5 (five) minutes x 3 doses as needed for chest pain.   polyethylene glycol 17 g packet Commonly known as: MIRALAX / GLYCOLAX Take 17 g by  mouth daily as needed for mild constipation.        Diagnostic Studies: No results found.  Discharge Instructions     Call MD / Call 911   Complete by: As directed    If you experience chest pain or shortness of breath, CALL 911 and be transported to the hospital emergency room.  If you develope a fever above 101 F, pus (white drainage) or increased drainage or redness at the wound, or calf pain, call your surgeon's office.   Constipation Prevention   Complete by: As directed    Drink plenty of fluids.  Prune juice may be helpful.  You may use a stool softener, such as Colace (over the counter) 100 mg twice a day.  Use MiraLax (over the counter) for constipation as needed.   Diet - low sodium heart healthy   Complete by: As directed  Discharge instructions   Complete by: As directed    Call if there is increasing drainage, fever greater than 101.5, severe head aches, and worsening nausea or light sensitivity. If shortness of breath, bloody cough or chest tightness or pain go to an emergency room. No lifting greater than 10 lbs. Avoid bending, stooping and twisting. Use brace when sitting and out of bed even to go to bathroom. Walk in house for first 2 weeks then may start to get out slowly increasing distances up to one mile by 4-6 weeks post op. After 5 days may shower and change dressing following bathing with shower.When bathing remove the brace shower and replace brace before getting out of the shower. If drainage, keep dry dressing and do not bathe the incision, use an moisture impervious dressing. Please call and return for scheduled follow up appointment 2 weeks from the time of surgery. You are taking a large amount of opioid medications, this is a controlled substance and causes respiratory depression, overdose can kill A person due to lack of oxygen due to respiratory ceasation. I am prescribing a narcan antidote to be used if you feel or you care assisting You sees that  you are showing signs of respiratory stopping or short of breath. Narcan will stop the affect of the opioid and breathing usually  I helped, It will also stop the pain control that the narcotic provides so use as needed if there is an emergency. Also call 911 if there is concern About shortness of breath or overdose.   Driving restrictions   Complete by: As directed    No driving.   Incentive spirometry RT   Complete by: As directed    Increase activity slowly as tolerated   Complete by: As directed    Lifting restrictions   Complete by: As directed    No lifting.   Post-operative opioid taper instructions:   Complete by: As directed    POST-OPERATIVE OPIOID TAPER INSTRUCTIONS: It is important to wean off of your opioid medication as soon as possible. If you do not need pain medication after your surgery it is ok to stop day one. Opioids include: Codeine, Hydrocodone(Norco, Vicodin), Oxycodone(Percocet, oxycontin) and hydromorphone amongst others.  Long term and even short term use of opiods can cause: Increased pain response Dependence Constipation Depression Respiratory depression And more.  Withdrawal symptoms can include Flu like symptoms Nausea, vomiting And more Techniques to manage these symptoms Hydrate well Eat regular healthy meals Stay active Use relaxation techniques(deep breathing, meditating, yoga) Do Not substitute Alcohol to help with tapering If you have been on opioids for less than two weeks and do not have pain than it is ok to stop all together.  Plan to wean off of opioids This plan should start within one week post op of your joint replacement. Maintain the same interval or time between taking each dose and first decrease the dose.  Cut the total daily intake of opioids by one tablet each day Next start to increase the time between doses. The last dose that should be eliminated is the evening dose.           Follow-up Information     Kerrin Champagne, MD Follow up in 2 week(s).   Specialty: Orthopedic Surgery Contact information: [address] [place] [address] [phone]         [facility] Follow up.   Specialty: Home Health Services Why: [facility] will provide you with  your home health services, start of are will be within 48 hours post your discharge Contact information: [address] [place] [address] [phone]                 Discharge Plan:  discharge to home  Disposition:     Signed: Zonia Kief  04/24/2022, 2:59 PM

## 2022-04-25 MED ORDER — OXYCODONE HCL 10 MG PO TABS: 10.0000 mg | ORAL_TABLET | Freq: Three times a day (TID) | ORAL | 0 refills | Status: DC | PRN

## 2022-04-26 ENCOUNTER — Other Ambulatory Visit: Payer: Self-pay | Admitting: Surgery

## 2022-04-26 MED ORDER — OXYCODONE HCL 10 MG PO TABS: 10.0000 mg | ORAL_TABLET | Freq: Three times a day (TID) | ORAL | 0 refills | Status: DC | PRN

## 2022-04-26 NOTE — Telephone Encounter (Signed)
I called and spoke with [facility] DO NOT have this in stock, PLEASE send to [facility] on [address]

## 2022-04-26 NOTE — Telephone Encounter (Signed)
Please resend medication refill pharm did not receive

## 2022-05-02 ENCOUNTER — Encounter: Payer: Self-pay | Admitting: Specialist

## 2022-05-02 ENCOUNTER — Ambulatory Visit: Payer: Self-pay

## 2022-05-02 ENCOUNTER — Ambulatory Visit (INDEPENDENT_AMBULATORY_CARE_PROVIDER_SITE_OTHER): Payer: Medicare Other | Admitting: Specialist

## 2022-05-02 VITALS — BP 153/75 | HR 66 | Ht 61.0 in | Wt 149.0 lb

## 2022-05-02 DIAGNOSIS — M4326 Fusion of spine, lumbar region: Secondary | ICD-10-CM

## 2022-05-02 MED ORDER — CYCLOBENZAPRINE HCL 10 MG PO TABS: 10.0000 mg | ORAL_TABLET | Freq: Three times a day (TID) | ORAL | 0 refills | Status: DC | PRN

## 2022-05-02 MED ORDER — OXYCODONE HCL 10 MG PO TABS: 10.0000 mg | ORAL_TABLET | Freq: Four times a day (QID) | ORAL | 0 refills | Status: DC

## 2022-05-02 NOTE — Patient Instructions (Signed)
Avoid frequent bending and stooping  No lifting greater than 5-10 lbs. May use ice or moist heat for pain. Weight loss is of benefit.  Exercise is important to improve your indurance and does allow people to function better inspite of back pain.

## 2022-05-02 NOTE — Progress Notes (Signed)
Post-Op Visit Note   Patient: [name]           Date of Birth: [birth year]           MRN: [id number] Visit Date: 05/02/2022 PCP: Orpah Cobb, MD   Assessment & Plan:  7.5 weeks post op L2-L4 TLIF Complaints of right groin discomfort and some low back pain. No bowel  or bladder discomfort. Legs Chief Complaint:  Chief Complaint  Patient presents with   Lower Back - Routine Post Op   Visit Diagnoses:  1. Fusion of lumbar spine   Legs with normal motor SLR is normal Incision is healed.   Plan: Avoid frequent bending and stooping  No lifting greater than 5-10 lbs. May use ice or moist heat for pain. Weight loss is of benefit.  Exercise is important to improve your indurance and does allow people to function better inspite of back pain.  Follow-Up Instructions: No follow-ups on file.   Orders:  Orders Placed This Encounter  Procedures   XR Lumbar Spine 2-3 Views   No orders of the defined types were placed in this encounter.   Imaging: No results found.  PMFS History: Patient Active Problem List   Diagnosis Date Noted   Anemia due to blood loss, acute 01/16/2018    Priority: High    Class: Acute   Infection of lumbar spine (HCC) 01/16/2018    Priority: High    Class: Acute   Spinal stenosis of lumbar region 01/13/2018    Priority: High    Class: Chronic   Spondylolisthesis, lumbar region 01/13/2018    Priority: High    Class: Chronic   Other secondary scoliosis, lumbar region    Fusion of spine of lumbar region 03/12/2022   Acute coronary syndrome (HCC) 11/25/2018   Thrush 04/07/2018   Drug-induced skin rash 04/07/2018   Chronic hepatitis C without hepatic coma (HCC) 02/09/2018   Hardware complicating wound infection (HCC) 02/09/2018   MRSA infection 02/09/2018   Fusion of lumbar spine 01/13/2018   Low back pain 12/23/2017   Tobacco use disorder 06/24/2016   Weight gain 01/02/2012   Stool incontinence 11/19/2011   Abdominal pain, right  lower quadrant 11/19/2011   EUSTACHIAN TUBE DYSFUNCTION 08/10/2009   SINUSITIS, ACUTE 08/10/2009   TRANSIENT GLOBAL AMNESIA 01/12/2009   HIP PAIN, RIGHT 10/04/2008   HEPATITIS C 07/28/2008   COPD, 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   HYPERLIPIDEMIA, MIXED, WITH LOW HDL 08/30/2007   OTITIS EXTERNA, INFECTIVE NOS 08/27/2007   ROTATOR CUFF SYNDROME, RIGHT 08/27/2007   Chest pain at rest 08/27/2007   BURSITIS NOS 07/13/2007   MUSCULOSKELETAL PAIN 07/13/2007   CARPAL TUNNEL SYNDROME, BILATERAL 04/10/2007   Past Medical History:  Diagnosis Date   Acute hepatitis C without mention of hepatic coma(070.51)    Anemia    Arthritis    back   Carpal tunnel syndrome    Chest pain, unspecified    Chronic airway obstruction, not elsewhere classified    Chronic hepatitis C without hepatic coma (HCC) 02/09/2018   Colitis    Coronary atherosclerosis of unspecified type of vessel, native or graft    Disorders of bursae and tendons in shoulder region, unspecified    Drug-induced skin rash 04/07/2018   GERD (gastroesophageal reflux disease)    Hardware complicating wound infection (HCC) 02/09/2018   Headache    migraines   Hepatitis    hx of hepatitis C   Infective otitis externa, unspecified    Mixed hyperlipidemia  MRSA infection 02/09/2018   Nonspecific abnormal results of thyroid function study    Other bursitis disorders    Other symptoms involving nervous and musculoskeletal systems(781.99)    Pneumonia    Seizures (HCC)    none for 4 years (as of 01/2018) "pseudo seizures"   Thrush 04/07/2018    Family History  Problem Relation Age of Onset   Crohn's disease Daughter 54       colon resection   Irritable bowel syndrome Daughter    Colonic polyp Mother    Thyroid disease Mother    COPD Mother    Uterine cancer Maternal Grandmother    Heart murmur Daughter        SVT   Colon cancer Neg Hx     Past Surgical History:  Procedure Laterality Date   ABDOMINAL HYSTERECTOMY     CARDIAC CATHETERIZATION      CARDIAC CATHETERIZATION N/A 06/25/2016   Procedure: Left Heart Cath and Coronary Angiography;  Surgeon: Orpah Cobb, MD;  Location: [facility];  Service: Cardiovascular;  Laterality: N/A;   CESAREAN SECTION     CHOLECYSTECTOMY     COLONOSCOPY     IR FLUORO GUIDED NEEDLE PLC ASPIRATION/INJECTION LOC  12/27/2017   LUMBAR FUSION  01/13/2018   Transforaminal lumbar interbody fusion L4-5 with screws, cages and rods, local bone graft, Allograft, Vivigen, Decompression L4-5 and L5-S1   TONSILLECTOMY     Social History   Occupational History   Occupation: care giver  Tobacco Use   Smoking status: Former    Types: Cigarettes    Quit date: 01/07/2018    Years since quitting: 4.3   Smokeless tobacco: Never   Tobacco comments:    2 cigarettes a day  Vaping Use   Vaping Use: Former   Quit date: 02/26/2022  Substance and Sexual Activity   Alcohol use: No   Drug use: No   Sexual activity: Yes    Partners: Male

## 2022-05-08 ENCOUNTER — Other Ambulatory Visit: Payer: Self-pay | Admitting: Specialist

## 2022-05-08 MED ORDER — OXYCODONE-ACETAMINOPHEN 7.5-325 MG PO TABS: 1.0000 | ORAL_TABLET | Freq: Three times a day (TID) | ORAL | 0 refills | Status: DC | PRN

## 2022-05-08 NOTE — Telephone Encounter (Signed)
Patient called. [facility] on [address] has oxycodone. Would like a refill called in for her. Her call back number is [phone]

## 2022-05-30 ENCOUNTER — Telehealth: Payer: Self-pay | Admitting: Specialist

## 2022-05-30 ENCOUNTER — Other Ambulatory Visit: Payer: Self-pay | Admitting: Surgery

## 2022-05-30 NOTE — Telephone Encounter (Signed)
Pt called requesting a refill of her pain medication. Please send to pharmacy on file. Pt phone number is [phone].

## 2022-05-31 ENCOUNTER — Other Ambulatory Visit: Payer: Self-pay | Admitting: Surgery

## 2022-05-31 MED ORDER — HYDROCODONE-ACETAMINOPHEN 7.5-325 MG PO TABS: 1.0000 | ORAL_TABLET | Freq: Four times a day (QID) | ORAL | 0 refills | Status: DC | PRN

## 2022-05-31 NOTE — Telephone Encounter (Signed)
I called, patient is not with person who answered the phone. Requested return call so that we can let her know that there is a change in her medication as she needs to wean and that it has been sent to her pharmacy.

## 2022-05-31 NOTE — Telephone Encounter (Signed)
I spoke with patient and advised. 

## 2022-06-05 ENCOUNTER — Emergency Department (HOSPITAL_COMMUNITY): Payer: Medicare Other

## 2022-06-05 ENCOUNTER — Observation Stay (HOSPITAL_COMMUNITY)
Admission: EM | Admit: 2022-06-05 | Discharge: 2022-06-07 | Disposition: A | Discharge status: Home / Self Care | Payer: Medicare Other | Attending: Cardiovascular Disease | Admitting: Cardiovascular Disease

## 2022-06-05 ENCOUNTER — Other Ambulatory Visit: Payer: Self-pay

## 2022-06-05 ENCOUNTER — Encounter (HOSPITAL_COMMUNITY): Payer: Self-pay

## 2022-06-05 DIAGNOSIS — Z8371 Family history of colonic polyps: Secondary | ICD-10-CM | POA: Diagnosis not present

## 2022-06-05 DIAGNOSIS — Z881 Allergy status to other antibiotic agents status: Secondary | ICD-10-CM

## 2022-06-05 DIAGNOSIS — R0602 Shortness of breath: Secondary | ICD-10-CM | POA: Insufficient documentation

## 2022-06-05 DIAGNOSIS — Z8049 Family history of malignant neoplasm of other genital organs: Secondary | ICD-10-CM

## 2022-06-05 DIAGNOSIS — I425 Other restrictive cardiomyopathy: Secondary | ICD-10-CM | POA: Diagnosis not present

## 2022-06-05 DIAGNOSIS — K219 Gastro-esophageal reflux disease without esophagitis: Secondary | ICD-10-CM | POA: Diagnosis not present

## 2022-06-05 DIAGNOSIS — M79603 Pain in arm, unspecified: Secondary | ICD-10-CM | POA: Diagnosis not present

## 2022-06-05 DIAGNOSIS — Z8349 Family history of other endocrine, nutritional and metabolic diseases: Secondary | ICD-10-CM

## 2022-06-05 DIAGNOSIS — I25110 Atherosclerotic heart disease of native coronary artery with unstable angina pectoris: Principal | ICD-10-CM | POA: Diagnosis present

## 2022-06-05 DIAGNOSIS — R0789 Other chest pain: Secondary | ICD-10-CM | POA: Diagnosis not present

## 2022-06-05 DIAGNOSIS — J449 Chronic obstructive pulmonary disease, unspecified: Secondary | ICD-10-CM | POA: Diagnosis present

## 2022-06-05 DIAGNOSIS — I2699 Other pulmonary embolism without acute cor pulmonale: Secondary | ICD-10-CM | POA: Diagnosis not present

## 2022-06-05 DIAGNOSIS — M549 Dorsalgia, unspecified: Secondary | ICD-10-CM | POA: Diagnosis present

## 2022-06-05 DIAGNOSIS — M25561 Pain in right knee: Secondary | ICD-10-CM | POA: Diagnosis present

## 2022-06-05 DIAGNOSIS — E782 Mixed hyperlipidemia: Secondary | ICD-10-CM | POA: Diagnosis present

## 2022-06-05 DIAGNOSIS — Z888 Allergy status to other drugs, medicaments and biological substances status: Secondary | ICD-10-CM

## 2022-06-05 DIAGNOSIS — B182 Chronic viral hepatitis C: Secondary | ICD-10-CM | POA: Diagnosis present

## 2022-06-05 DIAGNOSIS — I2510 Atherosclerotic heart disease of native coronary artery without angina pectoris: Secondary | ICD-10-CM | POA: Insufficient documentation

## 2022-06-05 DIAGNOSIS — Z885 Allergy status to narcotic agent status: Secondary | ICD-10-CM | POA: Diagnosis not present

## 2022-06-05 DIAGNOSIS — E785 Hyperlipidemia, unspecified: Secondary | ICD-10-CM | POA: Diagnosis not present

## 2022-06-05 DIAGNOSIS — Z90710 Acquired absence of both cervix and uterus: Secondary | ICD-10-CM

## 2022-06-05 DIAGNOSIS — Z79891 Long term (current) use of opiate analgesic: Secondary | ICD-10-CM | POA: Diagnosis not present

## 2022-06-05 DIAGNOSIS — D649 Anemia, unspecified: Secondary | ICD-10-CM | POA: Diagnosis not present

## 2022-06-05 DIAGNOSIS — Z8614 Personal history of Methicillin resistant Staphylococcus aureus infection: Secondary | ICD-10-CM

## 2022-06-05 DIAGNOSIS — F419 Anxiety disorder, unspecified: Secondary | ICD-10-CM | POA: Diagnosis present

## 2022-06-05 DIAGNOSIS — I10 Essential (primary) hypertension: Secondary | ICD-10-CM | POA: Diagnosis not present

## 2022-06-05 DIAGNOSIS — R002 Palpitations: Secondary | ICD-10-CM | POA: Diagnosis not present

## 2022-06-05 DIAGNOSIS — E876 Hypokalemia: Secondary | ICD-10-CM | POA: Diagnosis not present

## 2022-06-05 DIAGNOSIS — M1990 Unspecified osteoarthritis, unspecified site: Secondary | ICD-10-CM | POA: Diagnosis present

## 2022-06-05 DIAGNOSIS — Z825 Family history of asthma and other chronic lower respiratory diseases: Secondary | ICD-10-CM

## 2022-06-05 DIAGNOSIS — R739 Hyperglycemia, unspecified: Secondary | ICD-10-CM | POA: Diagnosis present

## 2022-06-05 DIAGNOSIS — R7303 Prediabetes: Secondary | ICD-10-CM | POA: Diagnosis present

## 2022-06-05 DIAGNOSIS — R079 Chest pain, unspecified: Principal | ICD-10-CM | POA: Insufficient documentation

## 2022-06-05 DIAGNOSIS — I249 Acute ischemic heart disease, unspecified: Secondary | ICD-10-CM | POA: Diagnosis present

## 2022-06-05 DIAGNOSIS — G8929 Other chronic pain: Secondary | ICD-10-CM | POA: Diagnosis present

## 2022-06-05 DIAGNOSIS — G40909 Epilepsy, unspecified, not intractable, without status epilepticus: Secondary | ICD-10-CM | POA: Diagnosis not present

## 2022-06-05 DIAGNOSIS — Z87891 Personal history of nicotine dependence: Secondary | ICD-10-CM

## 2022-06-05 DIAGNOSIS — Z9049 Acquired absence of other specified parts of digestive tract: Secondary | ICD-10-CM

## 2022-06-05 DIAGNOSIS — I700 Atherosclerosis of aorta: Secondary | ICD-10-CM | POA: Diagnosis not present

## 2022-06-05 DIAGNOSIS — E7849 Other hyperlipidemia: Secondary | ICD-10-CM | POA: Diagnosis not present

## 2022-06-05 LAB — CBC
HCT: 37 % (ref 36.0–46.0)
Hemoglobin: 11.5 g/dL — ABNORMAL LOW (ref 12.0–15.0)
MCH: 27.5 pg (ref 26.0–34.0)
MCHC: 31.1 g/dL (ref 30.0–36.0)
MCV: 88.5 fL (ref 80.0–100.0)
Platelets: 419 10*3/uL — ABNORMAL HIGH (ref 150–400)
RBC: 4.18 MIL/uL (ref 3.87–5.11)
RDW: 14.6 % (ref 11.5–15.5)
WBC: 8.9 10*3/uL (ref 4.0–10.5)
nRBC: 0 % (ref 0.0–0.2)

## 2022-06-05 LAB — COMPREHENSIVE METABOLIC PANEL
ALT: 12 U/L (ref 0–44)
AST: 20 U/L (ref 15–41)
Albumin: 3.6 g/dL (ref 3.5–5.0)
Alkaline Phosphatase: 80 U/L (ref 38–126)
Anion gap: 7 (ref 5–15)
BUN: 13 mg/dL (ref 8–23)
CO2: 27 mmol/L (ref 22–32)
Calcium: 8.8 mg/dL — ABNORMAL LOW (ref 8.9–10.3)
Chloride: 104 mmol/L (ref 98–111)
Creatinine, Ser: 0.72 mg/dL (ref 0.44–1.00)
GFR, Estimated: >60 mL/min (ref >60)
Glucose, Bld: 140 mg/dL — ABNORMAL HIGH (ref 70–99)
Potassium: 4.1 mmol/L (ref 3.5–5.1)
Sodium: 138 mmol/L (ref 135–145)
Total Bilirubin: 0.1 mg/dL — ABNORMAL LOW (ref 0.3–1.2)
Total Protein: 6.3 g/dL — ABNORMAL LOW (ref 6.5–8.1)

## 2022-06-05 LAB — TROPONIN I (HIGH SENSITIVITY)
Troponin I (High Sensitivity): 5 ng/L (ref <18)
Troponin I (High Sensitivity): 8 ng/L (ref <18)

## 2022-06-05 LAB — BRAIN NATRIURETIC PEPTIDE: B Natriuretic Peptide: 45.7 pg/mL (ref 0.0–100.0)

## 2022-06-05 LAB — HEPARIN LEVEL (UNFRACTIONATED): Heparin Unfractionated: <0.1 IU/mL — ABNORMAL LOW (ref 0.30–0.70)

## 2022-06-05 LAB — PROTIME-INR
INR: 1 (ref 0.8–1.2)
Prothrombin Time: 13.3 seconds (ref 11.4–15.2)

## 2022-06-05 LAB — MAGNESIUM: Magnesium: 2.3 mg/dL (ref 1.7–2.4)

## 2022-06-05 MED ORDER — IOHEXOL 350 MG/ML SOLN
100.0000 mL | Freq: Once | INTRAVENOUS | Status: AC | PRN
  Administered 2022-06-05: 100 mL via INTRAVENOUS

## 2022-06-05 MED ORDER — HEPARIN (PORCINE) 25000 UT/250ML-% IV SOLN
750.0000 [IU]/h | INTRAVENOUS | Status: DC
Start: 2022-06-05 — End: 2022-06-05
  Administered 2022-06-05: 750 [IU]/h via INTRAVENOUS
  Filled 2022-06-05: qty 250

## 2022-06-05 MED ORDER — ONDANSETRON HCL 4 MG/2ML IJ SOLN: 4.0000 mg | Freq: Four times a day (QID) | INTRAMUSCULAR | Status: DC | PRN

## 2022-06-05 MED ORDER — ACETAMINOPHEN 325 MG PO TABS
650.0000 mg | ORAL_TABLET | Freq: Once | ORAL | Status: AC
  Administered 2022-06-06: 650 mg via ORAL
  Filled 2022-06-05: qty 2

## 2022-06-05 MED ORDER — ATORVASTATIN CALCIUM 40 MG PO TABS
40.0000 mg | ORAL_TABLET | Freq: Every day | ORAL | Status: DC
  Filled 2022-06-05: qty 1

## 2022-06-05 MED ORDER — METOPROLOL TARTRATE 12.5 MG HALF TABLET
12.5000 mg | ORAL_TABLET | Freq: Two times a day (BID) | ORAL | Status: DC
  Administered 2022-06-05 – 2022-06-07 (×3): 12.5 mg via ORAL
  Filled 2022-06-05 (×4): qty 1

## 2022-06-05 MED ORDER — LORAZEPAM 1 MG PO TABS
0.5000 mg | ORAL_TABLET | Freq: Once | ORAL | Status: AC
  Administered 2022-06-05: 0.5 mg via ORAL
  Filled 2022-06-05: qty 1

## 2022-06-05 MED ORDER — ASPIRIN 81 MG PO TBEC
81.0000 mg | DELAYED_RELEASE_TABLET | Freq: Every day | ORAL | Status: DC
  Administered 2022-06-06 – 2022-06-07 (×2): 81 mg via ORAL
  Filled 2022-06-05 (×2): qty 1

## 2022-06-05 MED ORDER — FENTANYL CITRATE PF 50 MCG/ML IJ SOSY
50.0000 ug | PREFILLED_SYRINGE | Freq: Once | INTRAMUSCULAR | Status: AC
  Administered 2022-06-05: 50 ug via INTRAVENOUS
  Filled 2022-06-05: qty 1

## 2022-06-05 MED ORDER — SODIUM CHLORIDE 0.9 % IV SOLN: INTRAVENOUS | Status: DC

## 2022-06-05 MED ORDER — NITROGLYCERIN 2 % TD OINT
1.0000 [in_us] | TOPICAL_OINTMENT | Freq: Once | TRANSDERMAL | Status: AC
  Administered 2022-06-05: 1 [in_us] via TOPICAL
  Filled 2022-06-05: qty 1

## 2022-06-05 MED ORDER — HEPARIN BOLUS VIA INFUSION
3600.0000 [IU] | Freq: Once | INTRAVENOUS | Status: AC
  Administered 2022-06-05: 3600 [IU] via INTRAVENOUS
  Filled 2022-06-05: qty 3600

## 2022-06-05 NOTE — ED Provider Notes (Signed)
[facility] EMERGENCY DEPARTMENT Provider Note   CSN: [id number] Arrival date & time: 06/05/22  1505     History {Add pertinent medical, surgical, social history, OB history to HPI:1} Chief Complaint  Patient presents with   Chest Pain    [name] is a 66 y.o. female.   Chest Pain Associated symptoms: diaphoresis, dizziness, nausea and shortness of breath    Patient presents for chest pressure and shortness of breath.  Medical history includes tobacco use, HLD, COPD, CAD, anemia, arthritis, GERD, seizures.  She underwent left heart cath in 2017 with Dr. Algie Coffer which showed only mild disease.  Patient reports that she was in her normal state of health this morning.  At approximately 9 AM, she experienced a left-sided chest pressure, shortness of breath, nausea, transient diaphoresis, and a tingling sensation in her left arm.  At the time, she was feeding her infant grandchild.  Symptoms persisted throughout the day and have gotten worse.  Ultimately, EMS was called.  Patient received 324 of ASA and 2 SL NTG's prior to arrival.  She did report some relief of her chest pressure with the NTG's.  Patient does not use home oxygen.  She does not use breathing treatments.    Home Medications Prior to Admission medications   Medication Sig Start Date End Date Taking? Authorizing Provider  aspirin EC 81 MG tablet Take 1 tablet (81 mg total) by mouth daily. Swallow whole. 03/15/22   Kerrin Champagne, MD  Aspirin-Caffeine [phone] MG PACK Take 1 Package by mouth daily as needed (headache or pain).    [provider]  atorvastatin (LIPITOR) 40 MG tablet Take 1 tablet (40 mg total) by mouth daily at 6 PM. 11/26/18   Orpah Cobb, MD  cyclobenzaprine (FLEXERIL) 10 MG tablet Take 1 tablet (10 mg total) by mouth 3 (three) times daily as needed for muscle spasms. 05/02/22   Kerrin Champagne, MD  docusate sodium (COLACE) 100 MG capsule Take 1 capsule (100 mg total) by mouth 2  (two) times daily. 03/14/22   Kerrin Champagne, MD  gabapentin (NEURONTIN) 100 MG capsule Take 1 capsule (100 mg total) by mouth at bedtime. Patient not taking: Reported on 02/26/2022 03/19/21   Kerrin Champagne, MD  HYDROcodone-acetaminophen (NORCO) 7.5-325 MG tablet Take 1 tablet by mouth every 6 (six) hours as needed for moderate pain. 05/31/22   Naida Sleight, PA-C  methocarbamol (ROBAXIN) 500 MG tablet Take 1 tablet (500 mg total) by mouth every 6 (six) hours as needed for muscle spasms. 03/14/22   Kerrin Champagne, MD  naloxone [facility]) nasal spray 4 mg/0.1 mL Instill nasally if short of breath or showing signs of respiratory suppression or depression. 03/15/22   Naida Sleight, PA-C  nitroGLYCERIN (NITROSTAT) 0.4 MG SL tablet Place 1 tablet (0.4 mg total) under the tongue every 5 (five) minutes x 3 doses as needed for chest pain. 11/26/18   Orpah Cobb, MD  polyethylene glycol (MIRALAX / GLYCOLAX) 17 g packet Take 17 g by mouth daily as needed for mild constipation. 03/14/22   Kerrin Champagne, MD      Allergies    Acetaminophen-codeine, Morphine and related, Nitroglycerin, Tramadol hcl, and Azithromycin    Review of Systems   Review of Systems  Constitutional:  Positive for diaphoresis.  Respiratory:  Positive for shortness of breath.   Cardiovascular:  Positive for chest pain.  Gastrointestinal:  Positive for nausea.  Neurological:  Positive for dizziness.  All other systems reviewed and are negative.   Physical Exam Updated Vital Signs There were no vitals taken for this visit. Physical Exam Vitals and nursing note reviewed.  Constitutional:      General: She is not in acute distress.    Appearance: She is well-developed. She is not toxic-appearing or diaphoretic.  HENT:     Head: Normocephalic and atraumatic.  Eyes:     Extraocular Movements: Extraocular movements intact.     Conjunctiva/sclera: Conjunctivae normal.  Cardiovascular:     Rate and Rhythm: Normal rate and regular rhythm.      Heart sounds: Murmur heard.  Pulmonary:     Effort: Pulmonary effort is normal. No respiratory distress.     Breath sounds: Normal breath sounds. No decreased breath sounds, wheezing, rhonchi or rales.  Chest:     Chest wall: No tenderness.  Abdominal:     Palpations: Abdomen is soft.     Tenderness: There is no abdominal tenderness.  Musculoskeletal:        General: No swelling. Normal range of motion.     Cervical back: Neck supple.     Right lower leg: No edema.     Left lower leg: No edema.  Skin:    General: Skin is warm and dry.     Coloration: Skin is not cyanotic or pale.  Neurological:     General: No focal deficit present.     Mental Status: She is alert and oriented to person, place, and time.  Psychiatric:        Mood and Affect: Mood normal.        Behavior: Behavior normal.     ED Results / Procedures / Treatments   Labs (all labs ordered are listed, but only abnormal results are displayed) Labs Reviewed - No data to display  EKG None  Radiology No results found.  Procedures Procedures  {Document cardiac monitor, telemetry assessment procedure when appropriate:1}  Medications Ordered in ED Medications - No data to display  ED Course/ Medical Decision Making/ A&P                           Medical Decision Making Amount and/or Complexity of Data Reviewed Labs: ordered. Radiology: ordered.  Risk Prescription drug management.   This patient presents to the ED for concern of ***, this involves an extensive number of treatment options, and is a complaint that carries with it a high risk of complications and morbidity.  The differential diagnosis includes ***   Co morbidities that complicate the patient evaluation  ***   Additional history obtained:  Additional history obtained from *** External records from outside source obtained and reviewed including ***   Lab Tests:  I Ordered, and personally interpreted labs.  The pertinent  results include:  ***   Imaging Studies ordered:  I ordered imaging studies including ***  I independently visualized and interpreted imaging which showed *** I agree with the radiologist interpretation   Cardiac Monitoring: / EKG:  The patient was maintained on a cardiac monitor.  I personally viewed and interpreted the cardiac monitored which showed an underlying rhythm of: ***   Consultations Obtained:  I requested consultation with the ***,  and discussed lab and imaging findings as well as pertinent plan - they recommend: ***   Problem List / ED Course / Critical interventions / Medication management  *** I ordered medication including ***  for ***  Reevaluation of  the patient after these medicines showed that the patient {resolved/improved/worsened:23923::"improved"} I have reviewed the patients home medicines and have made adjustments as needed   Social Determinants of Health:  ***   Test / Admission - Considered:  ***   {Document critical care time when appropriate:1} {Document review of labs and clinical decision tools ie heart score, Chads2Vasc2 etc:1}  {Document your independent review of radiology images, and any outside records:1} {Document your discussion with family members, caretakers, and with consultants:1} {Document social determinants of health affecting pt's care:1} {Document your decision making why or why not admission, treatments were needed:1} Final Clinical Impression(s) / ED Diagnoses Final diagnoses:  None    Rx / DC Orders ED Discharge Orders     None

## 2022-06-05 NOTE — ED Notes (Signed)
Ambulates at restroom at this time

## 2022-06-05 NOTE — ED Triage Notes (Signed)
Per EMS patient had CP for 5 hrs causing her to be "short of breath" and have chest pressure which runs into her left arm. EMS provided 325 asa and 2 SL nitro. Patient reports that she does have a cardiologist and saw him last week of April. Patient reports that "she has an elephant on her chest"

## 2022-06-05 NOTE — H&P (Signed)
Referring Physician: Gloris Manchester  [name] is an 66 y.o. female.                       Chief Complaint: Chest pain   HPI: 67 years old white female with PMH of CAD, HTN, arthritis, GERD, Headache, HLD and tobacco use disorder has recurrent chest pain described as "elephant sitting on my chest" since thia AM. Her Troponin I levels and bnp are normal. CBC is near normal with mild anemia. BMET is near normal with mild hyperglycemia. She is feeling better with Aspirin and SL NTG.  Past Medical History:  Diagnosis Date   Acute hepatitis C without mention of hepatic coma(070.51)    Anemia    Arthritis    back   Carpal tunnel syndrome    Chest pain, unspecified    Chronic airway obstruction, not elsewhere classified    Chronic hepatitis C without hepatic coma (HCC) 02/09/2018   Colitis    Coronary atherosclerosis of unspecified type of vessel, native or graft    Disorders of bursae and tendons in shoulder region, unspecified    Drug-induced skin rash 04/07/2018   GERD (gastroesophageal reflux disease)    Hardware complicating wound infection (HCC) 02/09/2018   Headache    migraines   Hepatitis    hx of hepatitis C   Infective otitis externa, unspecified    Mixed hyperlipidemia    MRSA infection 02/09/2018   Nonspecific abnormal results of thyroid function study    Other bursitis disorders    Other symptoms involving nervous and musculoskeletal systems(781.99)    Pneumonia    Seizures (HCC)    none for 4 years (as of 01/2018) "pseudo seizures"   Thrush 04/07/2018      Past Surgical History:  Procedure Laterality Date   ABDOMINAL HYSTERECTOMY     CARDIAC CATHETERIZATION     CARDIAC CATHETERIZATION N/A 06/25/2016   Procedure: Left Heart Cath and Coronary Angiography;  Surgeon: Orpah Cobb, MD;  Location: [facility] INVASIVE CV LAB;  Service: Cardiovascular;  Laterality: N/A;   CESAREAN SECTION     CHOLECYSTECTOMY     COLONOSCOPY     IR FLUORO GUIDED NEEDLE PLC ASPIRATION/INJECTION LOC   12/27/2017   LUMBAR FUSION  01/13/2018   Transforaminal lumbar interbody fusion L4-5 with screws, cages and rods, local bone graft, Allograft, Vivigen, Decompression L4-5 and L5-S1   TONSILLECTOMY      Family History  Problem Relation Age of Onset   Crohn's disease Daughter 24       colon resection   Irritable bowel syndrome Daughter    Colonic polyp Mother    Thyroid disease Mother    COPD Mother    Uterine cancer Maternal Grandmother    Heart murmur Daughter        SVT   Colon cancer Neg Hx    Social History:  reports that she quit smoking about 4 years ago. Her smoking use included cigarettes. She has never used smokeless tobacco. She reports that she does not drink alcohol and does not use drugs.  Allergies:  Allergies  Allergen Reactions   Acetaminophen-Codeine Other (See Comments)    Hand get red, feels like it's on fire   Morphine And Related     "makes me feel like i'm on fire"   Nitroglycerin     Causes a headache   Tramadol Hcl     Gives her the shakes   Azithromycin Itching and Rash    (  Not in a hospital admission)   Results for orders placed or performed during the hospital encounter of 06/05/22 (from the past 48 hour(s))  Troponin I (High Sensitivity)     Status: None   Collection Time: 06/05/22  3:15 PM  Result Value Ref Range   Troponin I (High Sensitivity) 5 <18 ng/L    Comment: (NOTE) Elevated high sensitivity troponin I (hsTnI) values and significant  changes across serial measurements may suggest ACS but many other  chronic and acute conditions are known to elevate hsTnI results.  Refer to the "Links" section for chest pain algorithms and additional  guidance. Performed at [facility], [address], [place], [place] [address]   CBC     Status: Abnormal   Collection Time: 06/05/22  3:15 PM  Result Value Ref Range   WBC 8.9 4.0 - 10.5 K/uL   RBC 4.18 3.87 - 5.11 MIL/uL   Hemoglobin 11.5 (L) 12.0 - 15.0 g/dL   HCT 73.5 32.9 - 92.4 %    MCV 88.5 80.0 - 100.0 fL   MCH 27.5 26.0 - 34.0 pg   MCHC 31.1 30.0 - 36.0 g/dL   RDW 26.8 34.1 - 96.2 %   Platelets 419 (H) 150 - 400 K/uL   nRBC 0.0 0.0 - 0.2 %    Comment: Performed at [facility], [address], [place], [place] [address]  Comprehensive metabolic panel     Status: Abnormal   Collection Time: 06/05/22  3:15 PM  Result Value Ref Range   Sodium 138 135 - 145 mmol/L   Potassium 4.1 3.5 - 5.1 mmol/L   Chloride 104 98 - 111 mmol/L   CO2 27 22 - 32 mmol/L   Glucose, Bld 140 (H) 70 - 99 mg/dL    Comment: Glucose reference range applies only to samples taken after fasting for at least 8 hours.   BUN 13 8 - 23 mg/dL   Creatinine, Ser 8.92 0.44 - 1.00 mg/dL   Calcium 8.8 (L) 8.9 - 10.3 mg/dL   Total Protein 6.3 (L) 6.5 - 8.1 g/dL   Albumin 3.6 3.5 - 5.0 g/dL   AST 20 15 - 41 U/L   ALT 12 0 - 44 U/L   Alkaline Phosphatase 80 38 - 126 U/L   Total Bilirubin 0.1 (L) 0.3 - 1.2 mg/dL   GFR, Estimated >11 >94 mL/min    Comment: (NOTE) Calculated using the CKD-EPI Creatinine Equation (2021)    Anion gap 7 5 - 15    Comment: Performed at [facility], [address] [place], [place] [address]  Magnesium     Status: None   Collection Time: 06/05/22  3:15 PM  Result Value Ref Range   Magnesium 2.3 1.7 - 2.4 mg/dL    Comment: Performed at [facility], [address], [place], [place] [address]  Brain natriuretic peptide (order if patient c/o SOB ONLY)     Status: None   Collection Time: 06/05/22  3:15 PM  Result Value Ref Range   B Natriuretic Peptide 45.7 0.0 - 100.0 pg/mL    Comment: Performed at [facility], [address] [place], [place] [address]  Protime-INR     Status: None   Collection Time: 06/05/22  3:15 PM  Result Value Ref Range   Prothrombin Time 13.3 11.4 - 15.2 seconds   INR 1.0 0.8 - 1.2    Comment: (NOTE) INR goal varies based on device  and disease states. Performed at [facility], [address], [place],  [place] [address]   Heparin level (unfractionated)     Status: Abnormal   Collection Time: 06/05/22  3:15 PM  Result Value Ref Range   Heparin Unfractionated <0.10 (L) 0.30 - 0.70 IU/mL    Comment: (NOTE) The clinical reportable range upper limit is being lowered to >1.10 to align with the FDA approved guidance for the current laboratory assay.  If heparin results are below expected values, and patient dosage has  been confirmed, suggest follow up testing of antithrombin III levels. Performed at [facility], [address] [place], [place] [address]   Troponin I (High Sensitivity)     Status: None   Collection Time: 06/05/22  5:42 PM  Result Value Ref Range   Troponin I (High Sensitivity) 8 <18 ng/L    Comment: (NOTE) Elevated high sensitivity troponin I (hsTnI) values and significant  changes across serial measurements may suggest ACS but many other  chronic and acute conditions are known to elevate hsTnI results.  Refer to the "Links" section for chest pain algorithms and additional  guidance. Performed at [facility], [address], [place], [place] [address]    CT Angio Chest PE W and/or Wo Contrast  Result Date: 06/05/2022 CLINICAL DATA:  Pulmonary embolism (PE) suspected, high prob EXAM: CT ANGIOGRAPHY CHEST WITH CONTRAST TECHNIQUE: Multidetector CT imaging of the chest was performed using the standard protocol during bolus administration of intravenous contrast. Multiplanar CT image reconstructions and MIPs were obtained to evaluate the vascular anatomy. RADIATION DOSE REDUCTION: This exam was performed according to the departmental dose-optimization program which includes automated exposure control, adjustment of the mA and/or kV according to patient size and/or use of iterative reconstruction technique. CONTRAST:  OMNIPAQUE IOHEXOL 350 MG/ML SOLN COMPARISON:  02/26/2022 FINDINGS: Cardiovascular: No filling defects in the pulmonary arteries to suggest pulmonary  emboli. Heart is normal size. Aorta is normal caliber. Scattered aortic calcifications. Mediastinum/Nodes: No mediastinal, hilar, or axillary adenopathy. Trachea and esophagus are unremarkable. Thyroid unremarkable. Lungs/Pleura: Lungs are clear. No focal airspace opacities or suspicious nodules. No effusions. Upper Abdomen: No acute findings Musculoskeletal: Chest wall soft tissues are unremarkable. No acute bony abnormality. Review of the MIP images confirms the above findings. IMPRESSION: No evidence of pulmonary embolus. No acute cardiopulmonary disease. Aortic Atherosclerosis (ICD10-I70.0). Electronically Signed   By: Charlett Nose M.D.   On: 06/05/2022 18:54   DG Chest Portable 1 View  Result Date: 06/05/2022 CLINICAL DATA:  Chest pain EXAM: PORTABLE CHEST 1 VIEW COMPARISON:  02/09/2018 FINDINGS: Cardiomegaly. No acute airspace disease or effusion. Linear scarring or atelectasis at the bases. No pneumothorax. IMPRESSION: No active disease. Cardiomegaly. Linear atelectasis or scarring at the bases. Electronically Signed   By: Jasmine Pang M.D.   On: 06/05/2022 15:59    Review Of Systems Constitutional: No fever, chills, weight loss or gain. Eyes: No vision change, wears glasses. No discharge or pain. Ears: No hearing loss, No tinnitus. Respiratory: No asthma, COPD, pneumonias. Positive shortness of breath. No hemoptysis. Cardiovascular: Positive chest pain, palpitation, no leg edema. Gastrointestinal: No nausea, vomiting, diarrhea, constipation. No GI bleed. No hepatitis. Genitourinary: No dysuria, hematuria, kidney stone. No incontinance. Neurological: Positive headache, stroke, seizures.  Psychiatry: No psych facility admission for anxiety, depression, suicide. No detox. Skin: No rash. Musculoskeletal: Positive joint pain, fibromyalgia, neck pain, back pain. Lymphadenopathy: No lymphadenopathy. Hematology: Positive anemia, no easy bruising.   Blood pressure Marland Kitchen)  161/77, pulse 64,  temperature 98.6 F (37 C), resp. rate 18, height 5\' 1"  (1.549 m), weight 66.7 kg, SpO2 99 %. Body mass index is 27.78 kg/m. General appearance: alert, cooperative, appears stated age and moderate distress Head: Normocephalic, atraumatic. Eyes: Blue eyes, pink conjunctiva, corneas clear.  Neck: No adenopathy, no carotid bruit, no JVD, supple, symmetrical, trachea midline and thyroid not enlarged. Resp: Clear to auscultation bilaterally. Cardio: Regular rate and rhythm, S1, S2 normal, II/VI systolic murmur, no click, rub or gallop GI: Soft, non-tender; bowel sounds normal; no organomegaly. Extremities: No edema, cyanosis or clubbing. Skin: Warm and dry.  Neurologic: Alert and oriented X 3, normal strength. Normal coordination and slow gait.  Assessment/Plan Acute coronary syndrome CAD Tobacco use disorder H/O hepatitis C S/P lumbar spinal surgery  Plan: Admit. IV heparin Stress test v/s cardiac catheterizarion. Home medications.  Time spent: Review of old records, Lab, x-rays, EKG, other cardiac tests, examination, discussion with patient/ER doctor over 70 minutes.  , MD  06/05/2022, 9:20 PM

## 2022-06-05 NOTE — Progress Notes (Signed)
ANTICOAGULATION CONSULT NOTE - Initial Consult  Pharmacy Consult for heparin Indication: chest pain/ACS  Allergies  Allergen Reactions   Acetaminophen-Codeine Other (See Comments)    Hand get red, feels like it's on fire   Morphine And Related     "makes me feel like i'm on fire"   Nitroglycerin     Causes a headache   Tramadol Hcl     Gives her the shakes   Azithromycin Itching and Rash    Patient Measurements:   Heparin Dosing Weight: 61.8 kg   Vital Signs: Temp: 98.3 F (36.8 C) (08/23 1510) BP: 164/91 (08/23 1508) Pulse Rate: 79 (08/23 1510)  Labs: Recent Labs    06/05/22 1515  HGB 11.5*  HCT 37.0  PLT 419*  LABPROT 13.3  INR 1.0    CrCl cannot be calculated (Patient's most recent lab result is older than the maximum 21 days allowed.).   Medical History: Past Medical History:  Diagnosis Date   Acute hepatitis C without mention of hepatic coma(070.51)    Anemia    Arthritis    back   Carpal tunnel syndrome    Chest pain, unspecified    Chronic airway obstruction, not elsewhere classified    Chronic hepatitis C without hepatic coma (HCC) 02/09/2018   Colitis    Coronary atherosclerosis of unspecified type of vessel, native or graft    Disorders of bursae and tendons in shoulder region, unspecified    Drug-induced skin rash 04/07/2018   GERD (gastroesophageal reflux disease)    Hardware complicating wound infection (HCC) 02/09/2018   Headache    migraines   Hepatitis    hx of hepatitis C   Infective otitis externa, unspecified    Mixed hyperlipidemia    MRSA infection 02/09/2018   Nonspecific abnormal results of thyroid function study    Other bursitis disorders    Other symptoms involving nervous and musculoskeletal systems(781.99)    Pneumonia    Seizures (HCC)    none for 4 years (as of 01/2018) "pseudo seizures"   Thrush 04/07/2018    Assessment: 66 yo F presenting with chest pain for 5 hrs and pharmacy is consulted for heparin dosing.   Baseline CBC in ED: Hgb 11.5/Plt 419    Goal of Therapy:  Heparin level 0.3-0.7 units/ml Monitor platelets by anticoagulation protocol: Yes   Plan:  Give heparin 3600 units bolus x 1 Start heparin infusion at 750 units/hr Check anti-Xa level in 6 hours and daily while on heparin Continue to monitor H&H and platelets  De Burrs Clinical Pharmacist 06/05/2022,3:56 PM

## 2022-06-05 NOTE — ED Notes (Signed)
Patient transported to CT 

## 2022-06-06 ENCOUNTER — Inpatient Hospital Stay (HOSPITAL_COMMUNITY): Payer: Medicare Other

## 2022-06-06 ENCOUNTER — Encounter (HOSPITAL_COMMUNITY)
Admission: EM | Disposition: A | Discharge status: Home / Self Care | Payer: Self-pay | Source: Home / Self Care | Attending: Emergency Medicine

## 2022-06-06 HISTORY — PX: LEFT HEART CATH AND CORONARY ANGIOGRAPHY: CATH118249

## 2022-06-06 LAB — HEMOGLOBIN A1C
Hgb A1c MFr Bld: 6.3 % — ABNORMAL HIGH (ref 4.8–5.6)
Mean Plasma Glucose: 134.11 mg/dL

## 2022-06-06 LAB — LIPID PANEL
Cholesterol: 183 mg/dL (ref 0–200)
HDL: 52 mg/dL (ref >40)
LDL Cholesterol: 112 mg/dL — ABNORMAL HIGH (ref 0–99)
Total CHOL/HDL Ratio: 3.5 RATIO
Triglycerides: 96 mg/dL (ref <150)
VLDL: 19 mg/dL (ref 0–40)

## 2022-06-06 LAB — ECHOCARDIOGRAM COMPLETE
AR max vel: 1.87 cm2
AV Area VTI: 1.56 cm2
AV Area mean vel: 1.56 cm2
AV Mean grad: 5 mmHg
AV Peak grad: 9.5 mmHg
Ao pk vel: 1.54 m/s
Area-P 1/2: 1.92 cm2
Height: 61 in
MV M vel: 1.05 m/s
MV Peak grad: 4.4 mmHg
S' Lateral: 2.9 cm
Weight: 2356.28 oz

## 2022-06-06 LAB — RAPID URINE DRUG SCREEN, HOSP PERFORMED
Amphetamines: NOT DETECTED
Barbiturates: NOT DETECTED
Benzodiazepines: NOT DETECTED
Cocaine: NOT DETECTED
Opiates: POSITIVE — AB
Tetrahydrocannabinol: NOT DETECTED

## 2022-06-06 LAB — BASIC METABOLIC PANEL
Anion gap: 9 (ref 5–15)
BUN: 10 mg/dL (ref 8–23)
CO2: 26 mmol/L (ref 22–32)
Calcium: 8.5 mg/dL — ABNORMAL LOW (ref 8.9–10.3)
Chloride: 103 mmol/L (ref 98–111)
Creatinine, Ser: 0.58 mg/dL (ref 0.44–1.00)
GFR, Estimated: >60 mL/min (ref >60)
Glucose, Bld: 106 mg/dL — ABNORMAL HIGH (ref 70–99)
Potassium: 3.3 mmol/L — ABNORMAL LOW (ref 3.5–5.1)
Sodium: 138 mmol/L (ref 135–145)

## 2022-06-06 LAB — CBC
HCT: 35.7 % — ABNORMAL LOW (ref 36.0–46.0)
Hemoglobin: 11 g/dL — ABNORMAL LOW (ref 12.0–15.0)
MCH: 27.1 pg (ref 26.0–34.0)
MCHC: 30.8 g/dL (ref 30.0–36.0)
MCV: 87.9 fL (ref 80.0–100.0)
Platelets: 370 10*3/uL (ref 150–400)
RBC: 4.06 MIL/uL (ref 3.87–5.11)
RDW: 14.6 % (ref 11.5–15.5)
WBC: 9.2 10*3/uL (ref 4.0–10.5)
nRBC: 0 % (ref 0.0–0.2)

## 2022-06-06 LAB — HIV ANTIBODY (ROUTINE TESTING W REFLEX): HIV Screen 4th Generation wRfx: NONREACTIVE

## 2022-06-06 SURGERY — LEFT HEART CATH AND CORONARY ANGIOGRAPHY
Anesthesia: LOCAL

## 2022-06-06 MED ORDER — SODIUM CHLORIDE 0.9% FLUSH
3.0000 mL | Freq: Two times a day (BID) | INTRAVENOUS | Status: DC
  Administered 2022-06-07: 3 mL via INTRAVENOUS

## 2022-06-06 MED ORDER — LIDOCAINE HCL (PF) 1 % IJ SOLN
INTRAMUSCULAR | Status: AC
  Filled 2022-06-06: qty 30

## 2022-06-06 MED ORDER — MIDAZOLAM HCL 2 MG/2ML IJ SOLN
INTRAMUSCULAR | Status: AC
  Filled 2022-06-06: qty 2

## 2022-06-06 MED ORDER — SODIUM CHLORIDE 0.9 % IV SOLN: INTRAVENOUS | Status: AC

## 2022-06-06 MED ORDER — MIDAZOLAM HCL 2 MG/2ML IJ SOLN
INTRAMUSCULAR | Status: DC | PRN
  Administered 2022-06-06 (×2): 1 mg via INTRAVENOUS

## 2022-06-06 MED ORDER — HYDRALAZINE HCL 20 MG/ML IJ SOLN
10.0000 mg | INTRAMUSCULAR | Status: AC | PRN
  Administered 2022-06-06: 10 mg via INTRAVENOUS

## 2022-06-06 MED ORDER — SODIUM CHLORIDE 0.9 % IV SOLN: 250.0000 mL | INTRAVENOUS | Status: DC | PRN

## 2022-06-06 MED ORDER — HYDRALAZINE HCL 20 MG/ML IJ SOLN
INTRAMUSCULAR | Status: AC
  Filled 2022-06-06: qty 1

## 2022-06-06 MED ORDER — SALINE SPRAY 0.65 % NA SOLN
1.0000 | NASAL | Status: DC | PRN
  Filled 2022-06-06: qty 44

## 2022-06-06 MED ORDER — SODIUM CHLORIDE 0.9% FLUSH: 3.0000 mL | INTRAVENOUS | Status: DC | PRN

## 2022-06-06 MED ORDER — LABETALOL HCL 5 MG/ML IV SOLN: 10.0000 mg | INTRAVENOUS | Status: AC | PRN

## 2022-06-06 MED ORDER — SODIUM CHLORIDE 0.9% FLUSH
3.0000 mL | Freq: Two times a day (BID) | INTRAVENOUS | Status: DC
  Administered 2022-06-06 – 2022-06-07 (×2): 3 mL via INTRAVENOUS

## 2022-06-06 MED ORDER — HEPARIN (PORCINE) IN NACL 1000-0.9 UT/500ML-% IV SOLN
INTRAVENOUS | Status: AC
  Filled 2022-06-06: qty 1000

## 2022-06-06 MED ORDER — LIDOCAINE HCL (PF) 1 % IJ SOLN
INTRAMUSCULAR | Status: DC | PRN
  Administered 2022-06-06: 15 mL

## 2022-06-06 MED ORDER — IOHEXOL 350 MG/ML SOLN
INTRAVENOUS | Status: DC | PRN
  Administered 2022-06-06: 40 mL

## 2022-06-06 MED ORDER — ALPRAZOLAM 0.25 MG PO TABS
0.2500 mg | ORAL_TABLET | Freq: Once | ORAL | Status: AC
  Administered 2022-06-06: 0.25 mg via ORAL
  Filled 2022-06-06: qty 1

## 2022-06-06 MED ORDER — FENTANYL CITRATE (PF) 100 MCG/2ML IJ SOLN
INTRAMUSCULAR | Status: AC
  Filled 2022-06-06: qty 2

## 2022-06-06 MED ORDER — POTASSIUM CHLORIDE CRYS ER 20 MEQ PO TBCR
20.0000 meq | EXTENDED_RELEASE_TABLET | Freq: Two times a day (BID) | ORAL | Status: DC
  Administered 2022-06-06 – 2022-06-07 (×3): 20 meq via ORAL
  Filled 2022-06-06: qty 2
  Filled 2022-06-06: qty 1
  Filled 2022-06-06 (×3): qty 2

## 2022-06-06 MED ORDER — HEPARIN (PORCINE) IN NACL 1000-0.9 UT/500ML-% IV SOLN
INTRAVENOUS | Status: DC | PRN
  Administered 2022-06-06 (×2): 500 mL

## 2022-06-06 MED ORDER — ACETAMINOPHEN 325 MG PO TABS
650.0000 mg | ORAL_TABLET | Freq: Four times a day (QID) | ORAL | Status: DC | PRN
  Administered 2022-06-06: 650 mg via ORAL
  Filled 2022-06-06: qty 2

## 2022-06-06 MED ORDER — OXYCODONE HCL 5 MG PO TABS
ORAL_TABLET | ORAL | Status: AC
  Filled 2022-06-06: qty 1

## 2022-06-06 MED ORDER — FENTANYL CITRATE (PF) 100 MCG/2ML IJ SOLN
INTRAMUSCULAR | Status: DC | PRN
  Administered 2022-06-06 (×2): 25 ug via INTRAVENOUS

## 2022-06-06 MED ORDER — SODIUM CHLORIDE 0.9 % IV SOLN: INTRAVENOUS | Status: DC

## 2022-06-06 MED ORDER — HEPARIN (PORCINE) 25000 UT/250ML-% IV SOLN
800.0000 [IU]/h | INTRAVENOUS | Status: DC
  Administered 2022-06-06: 800 [IU]/h via INTRAVENOUS
  Filled 2022-06-06: qty 250

## 2022-06-06 MED ORDER — OXYCODONE HCL 5 MG PO TABS
5.0000 mg | ORAL_TABLET | Freq: Once | ORAL | Status: AC
  Administered 2022-06-06: 5 mg via ORAL

## 2022-06-06 SURGICAL SUPPLY — 8 items
CATH INFINITI 5FR MULTPACK ANG (CATHETERS) IMPLANT
KIT HEART LEFT (KITS) ×1 IMPLANT
PACK CARDIAC CATHETERIZATION (CUSTOM PROCEDURE TRAY) ×1 IMPLANT
SHEATH PINNACLE 5F 10CM (SHEATH) IMPLANT
SHEATH PROBE COVER 6X72 (BAG) IMPLANT
SYR MEDRAD MARK 7 150ML (SYRINGE) ×1 IMPLANT
TRANSDUCER W/STOPCOCK (MISCELLANEOUS) ×1 IMPLANT
WIRE EMERALD 3MM-J .035X150CM (WIRE) IMPLANT

## 2022-06-06 NOTE — Progress Notes (Signed)
Pt stated not able to urinate while in the bed with purewick in place.   Nurse turned tap water on , provided warm blanket an warm press. Pt stated that it was not going to help her and that she was going to disconnect everything if she didn't get up.  Nurse explained pt that she was in bedrest and needed to be very careful with her leg and groin area.  This RN came back to pt room and pt had disconnected everything and urinated in the floor and bed.   Nurse and tech attempted to help pt, pt refused at this time. Emotional support given to pt, pt refused.

## 2022-06-06 NOTE — Progress Notes (Signed)
Pt back to unit. Placed on heart monitor. Dinner ordered. Call bell within reach.

## 2022-06-06 NOTE — Progress Notes (Signed)
Pt wants to walk to the bathroom. Nurse spoke with Dr. Algie Coffer to make aware of pt wishes to use the bathroom while been on bed rest.  Per MD okay to use bedside commode with applying pressure to groin area.  Nurse provided education with teach back to pt.

## 2022-06-06 NOTE — Progress Notes (Signed)
Sheath pulled at 1520 by Lendon Collar RN from 2 H and guided by [facility] RN. Vitals sign checked and stable before pull. Sheath was aspirated and removed, pressure was held for 25 minutes. Vitals were checked every 5 minutes throughout. Site was covered with Tegaderm and gauze. No hematoma and site is a level zero. Distal pulses were 2+. Vitals signs are stable post pull. Post sheath pull instructions were given to patients and bedrest started at 1545.

## 2022-06-06 NOTE — Progress Notes (Signed)
Pt admitted to [facility] room 09. Pt alert and oriented x4. She is able to make needs known. Pt ambulatory. Continent of bowel and bladder. Pt frequently takes purse to bathroom with each use, RN inquired if there is something we can provide for her that is in her purse. Pt stated she has feminine wipes that she is using. Pt also had a vape with her that was not in her purse but on the blanket upon arrival to unit. Pt stated the vape belongs to her daughter. Vape placed in pt's chart with her permission. An empty prescription bottle fell out of pt's purse, it was her home med Norco 7.5. Pt stated she is not able to get a "refill until next week and my doctor is weaning her off them." Call bell within reach, IV patent and infusing, and safety measures intact.

## 2022-06-06 NOTE — Progress Notes (Signed)
ANTICOAGULATION CONSULT NOTE - Initial Consult  Pharmacy Consult for heparin Indication: chest pain/ACS  Allergies  Allergen Reactions   Acetaminophen-Codeine Other (See Comments)    Hand get red, feels like it's on fire   Morphine And Related     "makes me feel like i'm on fire"   Nitroglycerin     Causes a headache   Tramadol Hcl     Gives her the shakes   Azithromycin Itching and Rash    Patient Measurements: Height: 5\' 1"  (154.9 cm) Weight: 66.8 kg (147 lb 4.3 oz) IBW/kg (Calculated) : 47.8 Heparin Dosing Weight: 61.9 kg  Vital Signs: Temp: 97.8 F (36.6 C) (08/24 0528) Temp Source: Oral (08/24 0528) BP: 152/78 (08/24 0528) Pulse Rate: 50 (08/24 0528)  Labs: Recent Labs    06/05/22 1515 06/05/22 1742 06/06/22 0348  HGB 11.5*  --  11.0*  HCT 37.0  --  35.7*  PLT 419*  --  370  LABPROT 13.3  --   --   INR 1.0  --   --   HEPARINUNFRC <0.10*  --   --   CREATININE 0.72  --  0.58  TROPONINIHS 5 8  --     Estimated Creatinine Clearance: 60.5 mL/min (by C-G formula based on SCr of 0.58 mg/dL).   Medical History: Past Medical History:  Diagnosis Date   Acute hepatitis C without mention of hepatic coma(070.51)    Anemia    Arthritis    back   Carpal tunnel syndrome    Chest pain, unspecified    Chronic airway obstruction, not elsewhere classified    Chronic hepatitis C without hepatic coma (HCC) 02/09/2018   Colitis    Coronary atherosclerosis of unspecified type of vessel, native or graft    Disorders of bursae and tendons in shoulder region, unspecified    Drug-induced skin rash 04/07/2018   GERD (gastroesophageal reflux disease)    Hardware complicating wound infection (HCC) 02/09/2018   Headache    migraines   Hepatitis    hx of hepatitis C   Infective otitis externa, unspecified    Mixed hyperlipidemia    MRSA infection 02/09/2018   Nonspecific abnormal results of thyroid function study    Other bursitis disorders    Other symptoms involving  nervous and musculoskeletal systems(781.99)    Pneumonia    Seizures (HCC)    none for 4 years (as of 01/2018) "pseudo seizures"   Thrush 04/07/2018    Medications:  Reports only taking Norco PTA. Norco last filled 8/18 x 30 tablets, pt is out of Norco and states she cannot get a refill for another week. Also had oxycodone 10 mg x 40 tablets filled on 8/4, discontinued per provider on 8/17, as they are starting to wean her off narcotics.   Assessment: 66 yo F presenting with chest pain, pharmacy is consulted for heparin dosing. Troponin and BNP wnl Hgb stable at 11/Plt wnl at 370    Goal of Therapy:  Heparin level 0.3-0.7 units/ml Monitor platelets by anticoagulation protocol: Yes   Plan:  Start heparin infusion at 800 units/hr Check anti-Xa level in 6-8 hours and daily while on heparin Continue to monitor H&H and platelets   Thank you for allowing 66 to participate in this patients care. [place], PharmD 06/06/2022 8:00 AM  **Pharmacist phone directory can be found on [email or web address] listed under [facility]**

## 2022-06-06 NOTE — Progress Notes (Signed)
Ref: Linda Cobb, MD   Subjective:  Awake. Chest pressure continues. Patient on hydrocodone for chronic back and knee pain. Mild hypokalemia. Bilateral calf pain 2 weeks ago. Yesterday's CT chest is negative for PE.  Objective:  Vital Signs in the last 24 hours: Temp:  [97.8 F (36.6 C)-98.6 F (37 C)] 98 F (36.7 C) (08/24 0717) Pulse Rate:  [48-82] 53 (08/24 0717) Cardiac Rhythm: Sinus bradycardia (08/24 0818) Resp:  [9-31] 16 (08/24 0717) BP: (90-180)/(48-130) 132/93 (08/24 0717) SpO2:  [92 %-100 %] 98 % (08/24 0717) Weight:  [66.7 kg-66.8 kg] 66.8 kg (08/24 0528)  Physical Exam: BP Readings from Last 1 Encounters:  06/06/22 (!) 132/93     Wt Readings from Last 1 Encounters:  06/06/22 66.8 kg    Weight change:  Body mass index is 27.83 kg/m. HEENT: [place]/AT, Eyes-Blue, Conjunctiva-Pink, Sclera-Non-icteric Neck: No JVD, No bruit, Trachea midline. Lungs:  Clear, Bilateral. Cardiac:  Regular rhythm, normal S1 and S2, no S3. II/VI systolic murmur. Abdomen:  Soft, non-tender. BS present. Extremities:  No edema present. No cyanosis. No clubbing. No calf tenderness or redness. CNS: AxOx3, Cranial nerves grossly intact, moves all 4 extremities.  Skin: Warm and dry.   Intake/Output from previous day: No intake/output data recorded.    Lab Results: BMET    Component Value Date/Time   NA 138 06/06/2022 0348   NA 138 06/05/2022 1515   NA 138 03/13/2022 0326   K 3.3 (L) 06/06/2022 0348   K 4.1 06/05/2022 1515   K 3.7 03/13/2022 0326   CL 103 06/06/2022 0348   CL 104 06/05/2022 1515   CL 107 03/13/2022 0326   CO2 26 06/06/2022 0348   CO2 27 06/05/2022 1515   CO2 27 03/13/2022 0326   GLUCOSE 106 (H) 06/06/2022 0348   GLUCOSE 140 (H) 06/05/2022 1515   GLUCOSE 118 (H) 03/13/2022 0326   BUN 10 06/06/2022 0348   BUN 13 06/05/2022 1515   BUN 6 (L) 03/13/2022 0326   CREATININE 0.58 06/06/2022 0348   CREATININE 0.72 06/05/2022 1515   CREATININE 0.75 03/13/2022 0326    CREATININE 0.75 04/07/2018 1045   CREATININE 0.75 12/26/2017 1258   CALCIUM 8.5 (L) 06/06/2022 0348   CALCIUM 8.8 (L) 06/05/2022 1515   CALCIUM 8.2 (L) 03/13/2022 0326   GFRNONAA >60 06/06/2022 0348   GFRNONAA >60 06/05/2022 1515   GFRNONAA >60 03/13/2022 0326   GFRNONAA 85 04/07/2018 1045   GFRAA >60 11/26/2018 0721   GFRAA >60 11/25/2018 1859   GFRAA 99 04/07/2018 1045   GFRAA >60 02/26/2018 1640   CBC    Component Value Date/Time   WBC 9.2 06/06/2022 0348   RBC 4.06 06/06/2022 0348   HGB 11.0 (L) 06/06/2022 0348   HCT 35.7 (L) 06/06/2022 0348   PLT 370 06/06/2022 0348   MCV 87.9 06/06/2022 0348   MCH 27.1 06/06/2022 0348   MCHC 30.8 06/06/2022 0348   RDW 14.6 06/06/2022 0348   LYMPHSABS 1.3 03/12/2021 1813   MONOABS 0.4 03/12/2021 1813   EOSABS 0.0 03/12/2021 1813   BASOSABS 0.0 03/12/2021 1813   HEPATIC Function Panel Recent Labs    03/07/22 1250 06/05/22 1515  PROT 6.1* 6.3*  ALBUMIN 3.4* 3.6  AST 18 20  ALT 13 12  ALKPHOS 65 80   HEMOGLOBIN A1C Lab Results  Component Value Date   MPG 134.11 06/06/2022   CARDIAC ENZYMES Lab Results  Component Value Date   CKTOTAL 11 11/13/2009   CKMB 0.7 11/13/2009  TROPONINI <0.03 11/26/2018   TROPONINI <0.03 11/26/2018   TROPONINI <0.03 11/25/2018   BNP No results for input(s): "PROBNP" in the last 8760 hours. TSH No results for input(s): "TSH" in the last 8760 hours. CHOLESTEROL Recent Labs    06/06/22 0348  CHOL 183    Scheduled Meds:  aspirin EC  81 mg Oral Daily   atorvastatin  40 mg Oral q1800   metoprolol tartrate  12.5 mg Oral BID   potassium chloride  20 mEq Oral BID   sodium chloride flush  3 mL Intravenous Q12H   Continuous Infusions:  sodium chloride 30 mL/hr at 06/06/22 1115   heparin 800 Units/hr (06/06/22 0757)   PRN Meds:.ondansetron (ZOFRAN) IV  Assessment/Plan:  Unstable angina CAD HTN H/o hepatitis C S/P lumbar spinal surgery with back pain Right knee pain Chronic  hydrocodone use Tobacco use disorder Hypokalemia Prediabetic HLD  Plan: Awaiting echocardiogram Awaiting cardiac cath. Potassium supplement. Back on atorvastatin.   LOS: 1 day   Time spent including chart review, lab review, examination, discussion with patient/Nurse : 30 min   Linda Cobb  MD  06/06/2022, 11:36 AM

## 2022-06-07 ENCOUNTER — Encounter (HOSPITAL_COMMUNITY): Payer: Self-pay | Admitting: Cardiovascular Disease

## 2022-06-07 DIAGNOSIS — R079 Chest pain, unspecified: Secondary | ICD-10-CM | POA: Diagnosis not present

## 2022-06-07 DIAGNOSIS — I10 Essential (primary) hypertension: Secondary | ICD-10-CM | POA: Diagnosis not present

## 2022-06-07 DIAGNOSIS — E7849 Other hyperlipidemia: Secondary | ICD-10-CM | POA: Diagnosis not present

## 2022-06-07 DIAGNOSIS — D649 Anemia, unspecified: Secondary | ICD-10-CM | POA: Diagnosis not present

## 2022-06-07 DIAGNOSIS — G40909 Epilepsy, unspecified, not intractable, without status epilepticus: Secondary | ICD-10-CM | POA: Diagnosis not present

## 2022-06-07 DIAGNOSIS — J449 Chronic obstructive pulmonary disease, unspecified: Secondary | ICD-10-CM | POA: Diagnosis not present

## 2022-06-07 DIAGNOSIS — I25110 Atherosclerotic heart disease of native coronary artery with unstable angina pectoris: Secondary | ICD-10-CM | POA: Diagnosis not present

## 2022-06-07 DIAGNOSIS — I425 Other restrictive cardiomyopathy: Secondary | ICD-10-CM | POA: Diagnosis not present

## 2022-06-07 DIAGNOSIS — B182 Chronic viral hepatitis C: Secondary | ICD-10-CM | POA: Diagnosis not present

## 2022-06-07 DIAGNOSIS — R0602 Shortness of breath: Secondary | ICD-10-CM | POA: Diagnosis not present

## 2022-06-07 LAB — BASIC METABOLIC PANEL
Anion gap: 7 (ref 5–15)
BUN: 13 mg/dL (ref 8–23)
CO2: 23 mmol/L (ref 22–32)
Calcium: 8.5 mg/dL — ABNORMAL LOW (ref 8.9–10.3)
Chloride: 110 mmol/L (ref 98–111)
Creatinine, Ser: 0.64 mg/dL (ref 0.44–1.00)
GFR, Estimated: >60 mL/min (ref >60)
Glucose, Bld: 100 mg/dL — ABNORMAL HIGH (ref 70–99)
Potassium: 4.2 mmol/L (ref 3.5–5.1)
Sodium: 140 mmol/L (ref 135–145)

## 2022-06-07 LAB — CBC
HCT: 35.9 % — ABNORMAL LOW (ref 36.0–46.0)
Hemoglobin: 11.3 g/dL — ABNORMAL LOW (ref 12.0–15.0)
MCH: 27.6 pg (ref 26.0–34.0)
MCHC: 31.5 g/dL (ref 30.0–36.0)
MCV: 87.6 fL (ref 80.0–100.0)
Platelets: 344 10*3/uL (ref 150–400)
RBC: 4.1 MIL/uL (ref 3.87–5.11)
RDW: 15 % (ref 11.5–15.5)
WBC: 7.3 10*3/uL (ref 4.0–10.5)
nRBC: 0 % (ref 0.0–0.2)

## 2022-06-07 LAB — POCT ACTIVATED CLOTTING TIME: Activated Clotting Time: 137 seconds

## 2022-06-07 LAB — LIPOPROTEIN A (LPA): Lipoprotein (a): 8.8 nmol/L (ref <75.0)

## 2022-06-07 MED ORDER — AMLODIPINE BESYLATE 2.5 MG PO TABS: 2.5000 mg | ORAL_TABLET | Freq: Every day | ORAL | 3 refills | Status: DC

## 2022-06-07 MED ORDER — ALPRAZOLAM 0.25 MG PO TABS
0.2500 mg | ORAL_TABLET | Freq: Once | ORAL | Status: AC
  Administered 2022-06-07: 0.25 mg via ORAL
  Filled 2022-06-07: qty 1

## 2022-06-07 MED ORDER — POTASSIUM CHLORIDE CRYS ER 10 MEQ PO TBCR: 10.0000 meq | EXTENDED_RELEASE_TABLET | Freq: Every day | ORAL | 3 refills | Status: DC

## 2022-06-07 MED ORDER — AMLODIPINE BESYLATE 2.5 MG PO TABS
2.5000 mg | ORAL_TABLET | Freq: Every day | ORAL | Status: DC
  Administered 2022-06-07: 2.5 mg via ORAL
  Filled 2022-06-07: qty 1

## 2022-06-07 MED ORDER — ALBUTEROL SULFATE (2.5 MG/3ML) 0.083% IN NEBU: 2.5000 mg | INHALATION_SOLUTION | Freq: Four times a day (QID) | RESPIRATORY_TRACT | Status: DC | PRN

## 2022-06-07 MED ORDER — POTASSIUM CHLORIDE CRYS ER 10 MEQ PO TBCR: 10.0000 meq | EXTENDED_RELEASE_TABLET | Freq: Every day | ORAL | Status: DC

## 2022-06-07 MED ORDER — ALBUTEROL SULFATE HFA 108 (90 BASE) MCG/ACT IN AERS: 2.0000 | INHALATION_SPRAY | Freq: Four times a day (QID) | RESPIRATORY_TRACT | 1 refills | Status: DC | PRN

## 2022-06-07 MED ORDER — HYDROCODONE-ACETAMINOPHEN 7.5-325 MG PO TABS
1.0000 | ORAL_TABLET | Freq: Once | ORAL | Status: AC
  Administered 2022-06-07: 1 via ORAL
  Filled 2022-06-07: qty 1

## 2022-06-07 MED ORDER — METOPROLOL TARTRATE 25 MG PO TABS: 12.5000 mg | ORAL_TABLET | Freq: Two times a day (BID) | ORAL | 3 refills | Status: DC

## 2022-06-07 NOTE — Care Management Important Message (Signed)
Important Message  Patient Details  Name: [name] MRN: [id number] Date of Birth: 06/10/1956   Medicare Important Message Given:  Yes     Renie Ora 06/07/2022, 10:20 AM

## 2022-06-07 NOTE — Discharge Summary (Signed)
Physician Discharge Summary  Patient ID: [name] MRN: [id number] DOB/AGE: 66/01/1956 66 y.o.  Admit date: 06/05/2022 Discharge date: 06/07/2022  Admission Diagnoses: Acute coronary syndrome CAD Tobacco use disorder H/O Hepatitis C S/P Lumbar surgery  Discharge Diagnoses:  Principal Problem:  Unstable angina Active problems:  Multivessel coronary artery disease  Hypertension  Hyperlipidemia  Prediabetic  Hypokalemia, corrected  Tobacco use disorder  Right knee pain  S/P lumbar spine back surgery with chronic back pain  Discharged Condition: fair  Hospital Course: 66 years old white female with CAD, HTN, HLD, arthritis, anxiety, tobacco use disorder and chronic back pain has recurrent chest pain described as "elephant sitting on my chest". Her troponin I levels were normal. She underwent cardiac catheterization that showed moderate progression of of her LAD disease from 30 % to 60 % in 6 years. She was advised to give up smoking and follow heart healthy diet, resume atorvastatin and use amlodipine for chest pain as she can not tolerate NTG. She was discharged in stable condition. She will see me in 1-2 week for follow up.  Consults: cardiology  Significant Diagnostic Studies: labs: Near normal CBC, BMET, Troponin I except mild anemia and mild hyperglycemia.   EKG : NSR to Sinus bradycardia.  CXR: Cardiomegaly.  CT chest: Negative for PE.  Echocardiogram on 06/06/2022 : Normal LV systolic function.  Cardiac catheterization: 60 % concentric, irregular mid LAD lesion and minimal disease in RCA and LCx.  Treatments: cardiac meds: Aspirin, metoprolol, amlodipine, potassium and atorvastatin. Albuterol inhaler as needed.  Discharge Exam: Blood pressure (!) 111/57, pulse 60, temperature 98.7 F (37.1 C), temperature source Oral, resp. rate 12, height _0  (1.549 m), weight 66.8 kg, SpO2 98 %. General appearance: alert, cooperative and appears stated age. Head:  Normocephalic, atraumatic. Eyes: Blue eyes, pink conjunctiva, corneas clear.   Neck: No adenopathy, no carotid bruit, no JVD, supple, symmetrical, trachea midline and thyroid not enlarged. Resp: Clear to auscultation bilaterally. Cardio: Regular rate and rhythm, S1, S2 normal, II/VI systolic murmur, no click, rub or gallop. GI: Soft, non-tender; bowel sounds normal; no organomegaly. Extremities: No edema, cyanosis or clubbing. Right groin cath site is stable. Skin: Warm and dry.  Neurologic: Alert and oriented X 3, normal strength and tone. Normal coordination and slow gait.  Disposition:    Allergies as of 06/07/2022       Reactions   Acetaminophen-codeine Other (See Comments)   Hand get red, feels like it's on fire   Morphine And Related    "makes me feel like i'm on fire"   Nitroglycerin    Causes a headache   Tramadol Hcl    Gives her the shakes   Azithromycin Itching, Rash        Medication List     STOP taking these medications    cyclobenzaprine 10 MG tablet Commonly known as: FLEXERIL   docusate sodium 100 MG capsule Commonly known as: COLACE   gabapentin 100 MG capsule Commonly known as: NEURONTIN   methocarbamol 500 MG tablet Commonly known as: ROBAXIN   naloxone 4 MG/0.1ML Liqd nasal spray kit Commonly known as: NARCAN   nitroGLYCERIN 0.4 MG SL tablet Commonly known as: NITROSTAT   polyethylene glycol 17 g packet Commonly known as: MIRALAX / GLYCOLAX       TAKE these medications    albuterol 108 (90 Base) MCG/ACT inhaler Commonly known as: VENTOLIN HFA Inhale 2 puffs into the lungs every 6 (six) hours as needed for wheezing or shortness  of breath.   amLODipine 2.5 MG tablet Commonly known as: NORVASC Take 1 tablet (2.5 mg total) by mouth daily. Start taking on: June 08, 2022   aspirin EC 81 MG tablet Take 1 tablet (81 mg total) by mouth daily. Swallow whole.   atorvastatin 40 MG tablet Commonly known as: LIPITOR Take 1 tablet (40  mg total) by mouth daily at 6 PM.   HYDROcodone-acetaminophen 7.5-325 MG tablet Commonly known as: NORCO Take 1 tablet by mouth every 6 (six) hours as needed for moderate pain.   metoprolol tartrate 25 MG tablet Commonly known as: LOPRESSOR Take 0.5 tablets (12.5 mg total) by mouth 2 (two) times daily.   potassium chloride 10 MEQ tablet Commonly known as: KLOR-CON M Take 1 tablet (10 mEq total) by mouth daily. Start taking on: June 08, 2022        Follow-up Information     Dixie Dials, MD Follow up in 1 week(s).   Specialty: Cardiology Contact information: [place] [address] [phone]                 Time spent: Review of old chart, current chart, lab, x-ray, cardiac tests and discussion with patient over 60 minutes.  Signed: Birdie Riddle 06/07/2022, 11:50 AM

## 2022-06-07 NOTE — TOC Progression Note (Signed)
Transition of Care [facility]) - Progression Note    Patient Details  Name: [name] MRN: [id number] Date of Birth: 03/26/1956  Transition of [facility]) CM/SW Contact  Leone Haven, RN Phone Number: 06/07/2022, 4:26 PM  Clinical Narrative:    Patient is for dc , has no needs.        Expected Discharge Plan and Services           Expected Discharge Date: 06/07/22                                     Social Determinants of Health (SDOH) Interventions    Readmission Risk Interventions     No data to display

## 2022-06-11 ENCOUNTER — Other Ambulatory Visit: Payer: Self-pay | Admitting: Specialist

## 2022-06-11 MED ORDER — HYDROCODONE-ACETAMINOPHEN 7.5-325 MG PO TABS
1.0000 | ORAL_TABLET | Freq: Two times a day (BID) | ORAL | 0 refills | Status: DC | PRN
Start: 2022-06-11 — End: 2022-06-25

## 2022-06-11 NOTE — Telephone Encounter (Signed)
Pt called and needs refill on both medication.   Cb [phone]

## 2022-06-12 ENCOUNTER — Other Ambulatory Visit: Payer: Self-pay | Admitting: Orthopedic Surgery

## 2022-06-12 ENCOUNTER — Encounter: Payer: Medicare Other | Admitting: Specialist

## 2022-06-12 MED ORDER — METHOCARBAMOL 500 MG PO TABS: 500.0000 mg | ORAL_TABLET | Freq: Four times a day (QID) | ORAL | 0 refills | Status: DC | PRN

## 2022-06-12 NOTE — Telephone Encounter (Signed)
[name] called in this morning to reschedule her appointment for today.  She is running a fever and states that she just got home from the hospital not too long ago.  In the meantime, she needs a refill of her muscle relaxant called into the pharmacy. She uses [facility] in [place].

## 2022-06-12 NOTE — Addendum Note (Signed)
Addended by: Penne Lash, Chaitra Mast N on: 06/12/2022 10:00 AM   Modules accepted: Orders

## 2022-06-20 ENCOUNTER — Emergency Department (HOSPITAL_COMMUNITY)
Admission: EM | Admit: 2022-06-20 | Discharge: 2022-06-21 | Disposition: A | Discharge status: Home / Self Care | Payer: Medicare Other | Attending: Emergency Medicine | Admitting: Emergency Medicine

## 2022-06-20 ENCOUNTER — Other Ambulatory Visit: Payer: Self-pay

## 2022-06-20 ENCOUNTER — Encounter (HOSPITAL_COMMUNITY): Payer: Self-pay

## 2022-06-20 DIAGNOSIS — Z79899 Other long term (current) drug therapy: Secondary | ICD-10-CM | POA: Insufficient documentation

## 2022-06-20 DIAGNOSIS — I2510 Atherosclerotic heart disease of native coronary artery without angina pectoris: Secondary | ICD-10-CM | POA: Diagnosis not present

## 2022-06-20 DIAGNOSIS — Z7982 Long term (current) use of aspirin: Secondary | ICD-10-CM | POA: Insufficient documentation

## 2022-06-20 DIAGNOSIS — J449 Chronic obstructive pulmonary disease, unspecified: Secondary | ICD-10-CM | POA: Diagnosis not present

## 2022-06-20 DIAGNOSIS — G4489 Other headache syndrome: Secondary | ICD-10-CM | POA: Diagnosis not present

## 2022-06-20 DIAGNOSIS — R0602 Shortness of breath: Secondary | ICD-10-CM | POA: Diagnosis not present

## 2022-06-20 DIAGNOSIS — R0789 Other chest pain: Secondary | ICD-10-CM | POA: Diagnosis not present

## 2022-06-20 DIAGNOSIS — I959 Hypotension, unspecified: Secondary | ICD-10-CM | POA: Diagnosis not present

## 2022-06-20 DIAGNOSIS — R072 Precordial pain: Secondary | ICD-10-CM | POA: Insufficient documentation

## 2022-06-20 DIAGNOSIS — R079 Chest pain, unspecified: Secondary | ICD-10-CM | POA: Diagnosis not present

## 2022-06-20 MED ORDER — FENTANYL CITRATE PF 50 MCG/ML IJ SOSY
50.0000 ug | PREFILLED_SYRINGE | Freq: Once | INTRAMUSCULAR | Status: AC
  Administered 2022-06-21: 50 ug via INTRAVENOUS
  Filled 2022-06-20: qty 1

## 2022-06-20 NOTE — ED Triage Notes (Signed)
Per EMS patient reports that she was here last week for a cardiac craterization. Patient reports CP (from armpit to armpit) patient report having covid s/s such as congestion and stuffy nose. Patient took [facility] powder prior to EMS arrival. EMS reports that the patient was anxious en route.

## 2022-06-20 NOTE — ED Provider Notes (Signed)
[facility] EMERGENCY DEPARTMENT Provider Note   CSN: [id number] Arrival date & time: 06/20/22  2159     History  Chief Complaint  Patient presents with   Shortness of Breath   Anxiety    [name] is a 66 y.o. female.  The history is provided by the patient.  Patient with extensive history with known coronary disease, COPD, GERD presents with multiple complaints.  Patient reports for the past several weeks she has frequent episodes of shortness of breath She reports she has dyspnea on exertion which is new for her over the past several weeks.  She also reports recent episodes of chest pain that rates the left arm.  She was recently admitted for the same symptoms and underwent cardiac cath without any intervention required.  She reports taking medications as prescribed.  She has recently cut back on tobacco use.  She reports that her shortness of breath continues.  She reports earlier today she had similar episode of chest pain that radiated to left arm.  No new weakness reported.  The pain does not radiate to her back or abdomen. No new cough or fever.  No vomiting She does report increased anxiety when she becomes short of breath   Past Medical History:  Diagnosis Date   Acute hepatitis C without mention of hepatic coma(070.51)    Anemia    Arthritis    back   Carpal tunnel syndrome    Chest pain, unspecified    Chronic airway obstruction, not elsewhere classified    Chronic hepatitis C without hepatic coma (HCC) 02/09/2018   Colitis    Coronary atherosclerosis of unspecified type of vessel, native or graft    Disorders of bursae and tendons in shoulder region, unspecified    Drug-induced skin rash 04/07/2018   GERD (gastroesophageal reflux disease)    Hardware complicating wound infection (HCC) 02/09/2018   Headache    migraines   Hepatitis    hx of hepatitis C   Infective otitis externa, unspecified    Mixed hyperlipidemia    MRSA infection  02/09/2018   Nonspecific abnormal results of thyroid function study    Other bursitis disorders    Other symptoms involving nervous and musculoskeletal systems(781.99)    Pneumonia    Seizures (HCC)    none for 4 years (as of 01/2018) "pseudo seizures"   Thrush 04/07/2018    Home Medications Prior to Admission medications   Medication Sig Start Date End Date Taking? Authorizing Provider  albuterol (VENTOLIN HFA) 108 (90 Base) MCG/ACT inhaler Inhale 2 puffs into the lungs every 6 (six) hours as needed for wheezing or shortness of breath. 06/07/22   Orpah Cobb, MD  amLODipine (NORVASC) 2.5 MG tablet Take 1 tablet (2.5 mg total) by mouth daily. 06/08/22   Orpah Cobb, MD  aspirin EC 81 MG tablet Take 1 tablet (81 mg total) by mouth daily. Swallow whole. Patient not taking: Reported on 06/05/2022 03/15/22   Kerrin Champagne, MD  atorvastatin (LIPITOR) 40 MG tablet Take 1 tablet (40 mg total) by mouth daily at 6 PM. Patient not taking: Reported on 06/05/2022 11/26/18   Orpah Cobb, MD  HYDROcodone-acetaminophen (NORCO) 7.5-325 MG tablet Take 1 tablet by mouth every 12 (twelve) hours as needed for moderate pain. 06/11/22   Kerrin Champagne, MD  methocarbamol (ROBAXIN) 500 MG tablet Take 1 tablet (500 mg total) by mouth every 6 (six) hours as needed for muscle spasms. 06/12/22   Kerrin Champagne,  MD  metoprolol tartrate (LOPRESSOR) 25 MG tablet Take 0.5 tablets (12.5 mg total) by mouth 2 (two) times daily. 06/07/22   Orpah Cobb, MD  potassium chloride (KLOR-CON M) 10 MEQ tablet Take 1 tablet (10 mEq total) by mouth daily. 06/08/22   Orpah Cobb, MD      Allergies    Acetaminophen-codeine, Morphine and related, Nitroglycerin, Tramadol hcl, and Azithromycin    Review of Systems   Review of Systems  Constitutional:  Negative for fever.  Respiratory:  Positive for shortness of breath.   Cardiovascular:  Positive for chest pain.  Gastrointestinal:  Positive for nausea. Negative for vomiting.   Psychiatric/Behavioral:  Negative for suicidal ideas. The patient is nervous/anxious.     Physical Exam Updated Vital Signs BP 118/60   Pulse 64   Temp 98.3 F (36.8 C)   Resp 18   SpO2 99%  Physical Exam CONSTITUTIONAL: Chronically ill-appearing, anxious HEAD: Normocephalic/atraumatic EYES: EOMI/PERRL ENMT: Mucous membranes moist NECK: supple no meningeal signs SPINE/BACK:entire spine nontender CV: S1/S2 noted, no murmurs/rubs/gallops noted LUNGS: Lungs are clear to auscultation bilaterally, no apparent distress ABDOMEN: soft, nontender, no rebound or guarding, bowel sounds noted throughout abdomen GU:no cva tenderness NEURO: Pt is awake/alert/appropriate, moves all extremitiesx4.  No facial droop.  No arm or leg drift EXTREMITIES: pulses normal/equalx4, full ROM, no lower extremity edema SKIN: warm, color normal PSYCH: anxious ED Results / Procedures / Treatments   Labs (all labs ordered are listed, but only abnormal results are displayed) Labs Reviewed  BASIC METABOLIC PANEL - Abnormal; Notable for the following components:      Result Value   Glucose, Bld 101 (*)    All other components within normal limits  CBC - Abnormal; Notable for the following components:   WBC 12.2 (*)    All other components within normal limits  TROPONIN I (HIGH SENSITIVITY)  TROPONIN I (HIGH SENSITIVITY)    EKG EKG Interpretation  Date/Time:  Thursday June 20 2022 23:57:25 EDT Ventricular Rate:  59 PR Interval:  128 QRS Duration: 84 QT Interval:  436 QTC Calculation: 431 R Axis:   4 Text Interpretation: Sinus bradycardia Otherwise normal ECG Confirmed by Zadie Rhine ([id number]) on 06/21/2022 12:15:29 AM  Radiology DG Chest Port 1 View  Result Date: 06/21/2022 CLINICAL DATA:  Shortness of breath EXAM: PORTABLE CHEST 1 VIEW COMPARISON:  06/05/2022 FINDINGS: The lungs No pleural effusion or pneumothorax. The heart is normal in size. IMPRESSION: No evidence of acute  cardiopulmonary disease. Electronically Signed   By: Charline Bills M.D.   On: 06/21/2022 00:35    Procedures Procedures    Medications Ordered in ED Medications  fentaNYL (SUBLIMAZE) injection 50 mcg (50 mcg Intravenous Given 06/21/22 0027)    ED Course/ Medical Decision Making/ A&P Clinical Course as of 06/21/22 0247  Thu Jun 20, 2022  2324 Patient had recent cardiac cath last month that revealed diffuse disease but no intervention required.  Lifestyle modifications were recommended.  It was recommended per chart that she be on dual antiplatelet therapy, but patient reports she is only on aspirin.  Will check EKG, labs and chest x-ray. [DW]  Fri Jun 21, 2022  0040 WBC(!): 12.2 Leukocytosis [DW]  0129 Patient reports feeling improved.  Will ambulate to see if this triggers any dyspnea or hypoxia [DW]  0246 Overall work-up is reassuring.  She ambulated without any hypoxia.  Troponins are negative.  No acute EKG changes.  She is in no acute distress.  Patient admits gets anxious  frequently which triggers her shortness of breath. [DW]  0246 Previous work-up last admission included negative CT chest for PE.  The work-up for shortness of breath can continue as an outpatient.  She has follow-up next week with her cardiologist.  We will also refer to pulmonology.  She should also stop smoking [DW]    Clinical Course User Index [DW] Zadie Rhine, MD                           Medical Decision Making Amount and/or Complexity of Data Reviewed Labs: ordered. Decision-making details documented in ED Course. Radiology: ordered. ECG/medicine tests: ordered.  Risk Prescription drug management.  This patient presents to the ED for concern of chest pain, this involves an extensive number of treatment options, and is a complaint that carries with it a high risk of complications and morbidity.  The differential diagnosis includes but is not limited to acute coronary syndrome, aortic dissection,  pulmonary embolism, pericarditis, pneumothorax, pneumonia, myocarditis, pleurisy, esophageal rupture    Comorbidities that complicate the patient evaluation: Patient's presentation is complicated by their history of CAD, COPD, anxiety  Social Determinants of Health: Patient's  recent tobacco use   increases the complexity of managing their presentation  Additional history obtained: Records reviewed previous admission documents  Lab Tests: I Ordered, and personally interpreted labs.  The pertinent results include: Leukocytosis  Imaging Studies ordered: I ordered imaging studies including X-ray chest   I independently visualized and interpreted imaging which showed no acute findings I agree with the radiologist interpretation    Medicines ordered and prescription drug management: I ordered medication including fentanyl for pain Reevaluation of the patient after these medicines showed that the patient    improved Patient reports she is not supposed to take nitroglycerin She is already taken aspirin  Test Considered: Considered CT chest for shortness of breath, but she had this recently and no significant changes since that time   Reevaluation: After the interventions noted above, I reevaluated the patient and found that they have :improved  Complexity of problems addressed: Patient's presentation is most consistent with  acute presentation with potential threat to life or bodily function  Disposition: After consideration of the diagnostic results and the patient's response to treatment,  I feel that the patent would benefit from discharge   .           Final Clinical Impression(s) / ED Diagnoses Final diagnoses:  Precordial pain  Shortness of breath    Rx / DC Orders ED Discharge Orders     None         Zadie Rhine, MD 06/21/22 [phone]

## 2022-06-21 ENCOUNTER — Emergency Department (HOSPITAL_COMMUNITY): Payer: Medicare Other

## 2022-06-21 DIAGNOSIS — R0602 Shortness of breath: Secondary | ICD-10-CM | POA: Diagnosis not present

## 2022-06-21 LAB — BASIC METABOLIC PANEL
Anion gap: 12 (ref 5–15)
BUN: 14 mg/dL (ref 8–23)
CO2: 22 mmol/L (ref 22–32)
Calcium: 9.2 mg/dL (ref 8.9–10.3)
Chloride: 105 mmol/L (ref 98–111)
Creatinine, Ser: 0.73 mg/dL (ref 0.44–1.00)
GFR, Estimated: >60 mL/min (ref >60)
Glucose, Bld: 101 mg/dL — ABNORMAL HIGH (ref 70–99)
Potassium: 4.2 mmol/L (ref 3.5–5.1)
Sodium: 139 mmol/L (ref 135–145)

## 2022-06-21 LAB — CBC
HCT: 39.1 % (ref 36.0–46.0)
Hemoglobin: 12.4 g/dL (ref 12.0–15.0)
MCH: 27.5 pg (ref 26.0–34.0)
MCHC: 31.7 g/dL (ref 30.0–36.0)
MCV: 86.7 fL (ref 80.0–100.0)
Platelets: 373 10*3/uL (ref 150–400)
RBC: 4.51 MIL/uL (ref 3.87–5.11)
RDW: 14.9 % (ref 11.5–15.5)
WBC: 12.2 10*3/uL — ABNORMAL HIGH (ref 4.0–10.5)
nRBC: 0 % (ref 0.0–0.2)

## 2022-06-21 LAB — TROPONIN I (HIGH SENSITIVITY)
Troponin I (High Sensitivity): 5 ng/L (ref <18)
Troponin I (High Sensitivity): 6 ng/L (ref <18)

## 2022-06-21 NOTE — ED Notes (Signed)
Pt able to ambulate with a steady gait, with O2 ranging from 96 to 100% room air. Pt did voice c/o feeling short of breath while walking. No visual distress seen at this time with complaints. Nurse notified.

## 2022-06-25 ENCOUNTER — Other Ambulatory Visit: Payer: Self-pay | Admitting: Specialist

## 2022-06-25 NOTE — Telephone Encounter (Signed)
Pt called requesting refill of pain medication. Please send to pharmacy on file. Pt phone number is [phone].

## 2022-06-26 MED ORDER — HYDROCODONE-ACETAMINOPHEN 7.5-325 MG PO TABS
1.0000 | ORAL_TABLET | Freq: Two times a day (BID) | ORAL | 0 refills | Status: DC | PRN
Start: 2022-06-26 — End: 2023-10-30

## 2022-07-03 ENCOUNTER — Encounter: Payer: Medicare Other | Admitting: Specialist

## 2022-07-09 ENCOUNTER — Ambulatory Visit (INDEPENDENT_AMBULATORY_CARE_PROVIDER_SITE_OTHER): Payer: Medicare Other | Admitting: Student in an Organized Health Care Education/Training Program

## 2022-07-09 ENCOUNTER — Encounter: Payer: Self-pay | Admitting: Student in an Organized Health Care Education/Training Program

## 2022-07-09 VITALS — BP 128/72 | HR 99 | Temp 98.1°F | Ht 61.0 in | Wt 139.0 lb

## 2022-07-09 DIAGNOSIS — L989 Disorder of the skin and subcutaneous tissue, unspecified: Secondary | ICD-10-CM | POA: Diagnosis not present

## 2022-07-09 DIAGNOSIS — R0602 Shortness of breath: Secondary | ICD-10-CM | POA: Diagnosis not present

## 2022-07-09 DIAGNOSIS — F419 Anxiety disorder, unspecified: Secondary | ICD-10-CM | POA: Diagnosis not present

## 2022-07-09 MED ORDER — ESCITALOPRAM OXALATE 10 MG PO TABS: 10.0000 mg | ORAL_TABLET | Freq: Every day | ORAL | 2 refills | Status: DC

## 2022-07-09 NOTE — Progress Notes (Signed)
Synopsis: Referred in for shortness of breath by Orpah Cobb, MD  Assessment & Plan:   1. Shortness of breath  Presents with shortness of breath of a month's duration in the setting of cardiac workup that was notable for diastolic dysfunction and CAD. CT of her chest was read as normal with no signs of ILD. TTE also with normal PASP. She does have multiple skin lesions over her arms of unclear etiology, albeit patient reports possibly scratching them.  For workup, I will obtain a pulmonary function test in addition to a basic auto-immune workup. I don't suspect her skin findings to be auto-immune in nature but given associated shortness of breath, will obtain ANA, ANCA, RF and anti-CCP. I will also send a referral to dermatology for biopsy of her skin lesions.  - ANA 12 Plus Profile (RDL) - ANCA Profile - Anti-CCP Ab, IgG + IgA (RDL) - CBC with Differential/Platelet - Pulmonary Function Test [facility] Only; Future  2. Anxiety  Patient reports significant anxiety and is teary during our interview. I offered a referral to psychiatry and/or psychotherapy but patient tells me she's unable to afford it. I will send a script for escitalopram and have counseled patient and her daughter on side effects of the medication.  - escitalopram (LEXAPRO) 10 MG tablet; Take 1 tablet (10 mg total) by mouth daily.  Dispense: 30 tablet; Refill: 2   Return in about 2 months (around 09/08/2022).  I spent 60 minutes caring for this patient today, including preparing to see the patient, obtaining and/or reviewing separately obtained history, performing a medically appropriate examination and/or evaluation, counseling and educating the patient/family/caregiver, ordering medications, tests, or procedures, documenting clinical information in the electronic health record, and independently interpreting results (not separately reported/billed) and communicating results to the patient/family/caregiver  Raechel Chute,  MD [facility] 07/09/2022 2:49 PM    End of visit medications:  Meds ordered this encounter  Medications   escitalopram (LEXAPRO) 10 MG tablet    Sig: Take 1 tablet (10 mg total) by mouth daily.    Dispense:  30 tablet    Refill:  2     Current Outpatient Medications:    albuterol (VENTOLIN HFA) 108 (90 Base) MCG/ACT inhaler, Inhale 2 puffs into the lungs every 6 (six) hours as needed for wheezing or shortness of breath., Disp: 8 g, Rfl: 1   amLODipine (NORVASC) 2.5 MG tablet, Take 1 tablet (2.5 mg total) by mouth daily., Disp: 30 tablet, Rfl: 3   aspirin EC 81 MG tablet, Take 1 tablet (81 mg total) by mouth daily. Swallow whole., Disp: 30 tablet, Rfl: 12   atorvastatin (LIPITOR) 40 MG tablet, Take 1 tablet (40 mg total) by mouth daily at 6 PM., Disp: 30 tablet, Rfl: 3   escitalopram (LEXAPRO) 10 MG tablet, Take 1 tablet (10 mg total) by mouth daily., Disp: 30 tablet, Rfl: 2   HYDROcodone-acetaminophen (NORCO) 7.5-325 MG tablet, Take 1 tablet by mouth every 12 (twelve) hours as needed for moderate pain., Disp: 14 tablet, Rfl: 0   methocarbamol (ROBAXIN) 500 MG tablet, Take 1 tablet (500 mg total) by mouth every 6 (six) hours as needed for muscle spasms., Disp: 30 tablet, Rfl: 0   metoprolol tartrate (LOPRESSOR) 25 MG tablet, Take 0.5 tablets (12.5 mg total) by mouth 2 (two) times daily., Disp: 30 tablet, Rfl: 3   potassium chloride (KLOR-CON M) 10 MEQ tablet, Take 1 tablet (10 mEq total) by mouth daily., Disp: 30 tablet, Rfl: 3  Subjective:   PATIENT ID: [name] GENDER: female DOB: Sep 25, 1956, MRN: [id number]  Chief Complaint  Patient presents with   pulmonary consult    SOB with exertion, talking or laying flat.     HPI  [name] is a pleasant 66 year old female who is presenting to clinic for the evaluation of shortness of breath.  She reports that she's been having shortness of breath for the past month. Her symptoms were sudden in onset and not  associated with any chest pain, chest tightness, cough, sputum production, wheezing, or hemoptysis. She doesn't report any fevers, chills, night sweats, or weight loss. The shortness of breath happens at rest, and is sporadic. It is not related to exertion. She reports significant anxiety associated with her shortness of breath and feels that she spends a lot of time at night ruminating. She also reports macular open sores over both her upper extremities, with one sore over her chest and another over her back. She says they start out as a small spot that she scratches and they subsequently develop into open sores that then heal.  She was recently seen in the hospital for chest pain and underwent a CT scan of the chest that was read as having emphysema and negative for PE. She underwent a cardiac cath that showed 60% stenosis in the mid to distal LAD. She did not undergo any stenting. TTE showed mild diastolic dysfunction.  Ancillary information including prior medications, full medical/surgical/family/social histories, and PFTs (when available) are listed below and have been reviewed. She is a former smoker (0.25 packs a day, around 12-15 pack years, quit last month)  Review of Systems  Constitutional:  Negative for chills, fever and weight loss.  Respiratory:  Positive for shortness of breath. Negative for cough, hemoptysis, sputum production and wheezing.   Cardiovascular:  Negative for chest pain, palpitations, leg swelling and PND.  Gastrointestinal:  Negative for abdominal pain, blood in stool, melena, nausea and vomiting.  Genitourinary:  Negative for dysuria and hematuria.  Neurological:  Negative for dizziness.  Psychiatric/Behavioral:  Positive for depression. The patient is nervous/anxious.      Objective:   Vitals:   07/09/22 1356  BP: 128/72  Pulse: 99  Temp: 98.1 F (36.7 C)  TempSrc: Temporal  SpO2: 98%  Weight: 139 lb (63 kg)  Height: 5\' 1"  (1.549 m)   98% on RA  BMI  Readings from Last 3 Encounters:  07/09/22 26.26 kg/m  06/06/22 27.83 kg/m  05/02/22 28.15 kg/m   Wt Readings from Last 3 Encounters:  07/09/22 139 lb (63 kg)  06/06/22 147 lb 4.3 oz (66.8 kg)  05/02/22 149 lb (67.6 kg)    Physical Exam Constitutional:      General: She is not in acute distress.    Appearance: Normal appearance. She is not ill-appearing.  HENT:     Mouth/Throat:     Mouth: Mucous membranes are moist.  Eyes:     Pupils: Pupils are equal, round, and reactive to light.  Cardiovascular:     Rate and Rhythm: Normal rate and regular rhythm.     Pulses: Normal pulses.     Heart sounds: Normal heart sounds.  Pulmonary:     Effort: Pulmonary effort is normal. No respiratory distress.     Breath sounds: Normal breath sounds. No stridor. No wheezing, rhonchi or rales.     Comments: One healing sore over her mid chest Chest:     Chest wall: No tenderness.  Abdominal:     Palpations: Abdomen is soft.  Musculoskeletal:     Comments: Multiple excoriated sores in different stages of healing over her bilateral upper extremities  Skin:    General: Skin is warm.  Neurological:     General: No focal deficit present.     Mental Status: She is alert and oriented to person, place, and time.  Psychiatric:        Mood and Affect: Mood is anxious and depressed.        Speech: Speech normal.     Comments: Anxious, eyes teary       Ancillary Information    Past Medical History:  Diagnosis Date   Acute hepatitis C without mention of hepatic coma(070.51)    Anemia    Arthritis    back   Carpal tunnel syndrome    Chest pain, unspecified    Chronic airway obstruction, not elsewhere classified    Chronic hepatitis C without hepatic coma (HCC) 02/09/2018   Colitis    Coronary atherosclerosis of unspecified type of vessel, native or graft    Disorders of bursae and tendons in shoulder region, unspecified    Drug-induced skin rash 04/07/2018   GERD (gastroesophageal  reflux disease)    Hardware complicating wound infection (HCC) 02/09/2018   Headache    migraines   Hepatitis    hx of hepatitis C   Infective otitis externa, unspecified    Mixed hyperlipidemia    MRSA infection 02/09/2018   Nonspecific abnormal results of thyroid function study    Other bursitis disorders    Other symptoms involving nervous and musculoskeletal systems(781.99)    Pneumonia    Seizures (HCC)    none for 4 years (as of 01/2018) "pseudo seizures"   Thrush 04/07/2018     Family History  Problem Relation Age of Onset   Crohn's disease Daughter 4       colon resection   Irritable bowel syndrome Daughter    Colonic polyp Mother    Thyroid disease Mother    COPD Mother    Uterine cancer Maternal Grandmother    Heart murmur Daughter        SVT   Colon cancer Neg Hx      Past Surgical History:  Procedure Laterality Date   ABDOMINAL HYSTERECTOMY     CARDIAC CATHETERIZATION     CARDIAC CATHETERIZATION N/A 06/25/2016   Procedure: Left Heart Cath and Coronary Angiography;  Surgeon: Orpah Cobb, MD;  Location: [facility];  Service: Cardiovascular;  Laterality: N/A;   CESAREAN SECTION     CHOLECYSTECTOMY     COLONOSCOPY     IR FLUORO GUIDED NEEDLE PLC ASPIRATION/INJECTION LOC  12/27/2017   LEFT HEART CATH AND CORONARY ANGIOGRAPHY N/A 06/06/2022   Procedure: LEFT HEART CATH AND CORONARY ANGIOGRAPHY;  Surgeon: Orpah Cobb, MD;  Location: [facility];  Service: Cardiovascular;  Laterality: N/A;   LUMBAR FUSION  01/13/2018   Transforaminal lumbar interbody fusion L4-5 with screws, cages and rods, local bone graft, Allograft, Vivigen, Decompression L4-5 and L5-S1   TONSILLECTOMY      Social History   Socioeconomic History   Marital status: Legally Separated    Spouse name: Not on file   Number of children: 4   Years of education: Not on file   Highest education level: Not on file  Occupational History   Occupation: care giver  Tobacco Use   Smoking  status: Former    Years: 53.00  Types: Cigarettes    Quit date: 06/16/2022    Years since quitting: 0.0   Smokeless tobacco: Never   Tobacco comments:    2 cigarettes a day  Vaping Use   Vaping Use: Former   Quit date: 02/26/2022  Substance and Sexual Activity   Alcohol use: No   Drug use: No   Sexual activity: Yes    Partners: Male  Other Topics Concern   Not on file  Social History Narrative   Not on file   Social Determinants of Health   Financial Resource Strain: Not on file  Food Insecurity: Not on file  Transportation Needs: Not on file  Physical Activity: Not on file  Stress: Not on file  Social Connections: Not on file  Intimate Partner Violence: Not on file     Allergies  Allergen Reactions   Acetaminophen-Codeine Other (See Comments)    Hand get red, feels like it's on fire   Morphine And Related     "makes me feel like i'm on fire"   Nitroglycerin     Causes a headache   Tramadol Hcl     Gives her the shakes   Azithromycin Itching and Rash     CBC    Component Value Date/Time   WBC 12.2 (H) 06/20/2022 2318   RBC 4.51 06/20/2022 2318   HGB 12.4 06/20/2022 2318   HCT 39.1 06/20/2022 2318   PLT 373 06/20/2022 2318   MCV 86.7 06/20/2022 2318   MCH 27.5 06/20/2022 2318   MCHC 31.7 06/20/2022 2318   RDW 14.9 06/20/2022 2318   LYMPHSABS 1.3 03/12/2021 1813   MONOABS 0.4 03/12/2021 1813   EOSABS 0.0 03/12/2021 1813   BASOSABS 0.0 03/12/2021 1813    Pulmonary Functions Testing Results:     No data to display          Outpatient Medications Prior to Visit  Medication Sig Dispense Refill   albuterol (VENTOLIN HFA) 108 (90 Base) MCG/ACT inhaler Inhale 2 puffs into the lungs every 6 (six) hours as needed for wheezing or shortness of breath. 8 g 1   amLODipine (NORVASC) 2.5 MG tablet Take 1 tablet (2.5 mg total) by mouth daily. 30 tablet 3   aspirin EC 81 MG tablet Take 1 tablet (81 mg total) by mouth daily. Swallow whole. 30 tablet 12    atorvastatin (LIPITOR) 40 MG tablet Take 1 tablet (40 mg total) by mouth daily at 6 PM. 30 tablet 3   HYDROcodone-acetaminophen (NORCO) 7.5-325 MG tablet Take 1 tablet by mouth every 12 (twelve) hours as needed for moderate pain. 14 tablet 0   methocarbamol (ROBAXIN) 500 MG tablet Take 1 tablet (500 mg total) by mouth every 6 (six) hours as needed for muscle spasms. 30 tablet 0   metoprolol tartrate (LOPRESSOR) 25 MG tablet Take 0.5 tablets (12.5 mg total) by mouth 2 (two) times daily. 30 tablet 3   potassium chloride (KLOR-CON M) 10 MEQ tablet Take 1 tablet (10 mEq total) by mouth daily. 30 tablet 3   No facility-administered medications prior to visit.

## 2022-07-09 NOTE — Patient Instructions (Signed)
Today, I ordered blood work. You can get them draw at your preferred [facility] draw station. The nearest one to you [address], [place], [place] [address].

## 2022-07-10 DIAGNOSIS — L281 Prurigo nodularis: Secondary | ICD-10-CM | POA: Diagnosis not present

## 2022-07-10 DIAGNOSIS — L299 Pruritus, unspecified: Secondary | ICD-10-CM | POA: Diagnosis not present

## 2022-07-25 ENCOUNTER — Encounter: Payer: Medicare Other | Admitting: Specialist

## 2022-09-10 ENCOUNTER — Ambulatory Visit: Payer: Medicare Other | Attending: Student in an Organized Health Care Education/Training Program

## 2022-09-10 DIAGNOSIS — R0602 Shortness of breath: Secondary | ICD-10-CM | POA: Insufficient documentation

## 2022-09-10 LAB — PULMONARY FUNCTION TEST ARMC ONLY
DL/VA % pred: 93 %
DL/VA: 4 ml/min/mmHg/L
DLCO unc % pred: 83 %
DLCO unc: 14.89 ml/min/mmHg
FEF 25-75 Post: 0.75 L/sec
FEF 25-75 Pre: 1.27 L/sec
FEF2575-%Change-Post: -41 %
FEF2575-%Pred-Post: 39 %
FEF2575-%Pred-Pre: 66 %
FEV1-%Change-Post: -24 %
FEV1-%Pred-Post: 52 %
FEV1-%Pred-Pre: 70 %
FEV1-Post: 1.12 L
FEV1-Pre: 1.48 L
FEV1FVC-%Change-Post: -23 %
FEV1FVC-%Pred-Pre: 90 %
FEV6-%Change-Post: -1 %
FEV6-%Pred-Post: 79 %
FEV6-%Pred-Pre: 80 %
FEV6-Post: 2.1 L
FEV6-Pre: 2.13 L
FEV6FVC-%Pred-Post: 104 %
FEV6FVC-%Pred-Pre: 104 %
FVC-%Change-Post: -1 %
FVC-%Pred-Post: 75 %
FVC-%Pred-Pre: 76 %
FVC-Post: 2.1 L
FVC-Pre: 2.13 L
Post FEV1/FVC ratio: 53 %
Post FEV6/FVC ratio: 100 %
Pre FEV1/FVC ratio: 70 %
Pre FEV6/FVC Ratio: 100 %
RV % pred: 110 %
RV: 2.16 L
TLC % pred: 92 %
TLC: 4.27 L

## 2022-09-10 MED ORDER — ALBUTEROL SULFATE (2.5 MG/3ML) 0.083% IN NEBU
2.5000 mg | INHALATION_SOLUTION | Freq: Once | RESPIRATORY_TRACT | Status: AC
  Administered 2022-09-10: 2.5 mg via RESPIRATORY_TRACT
  Filled 2022-09-10: qty 3

## 2022-09-16 ENCOUNTER — Ambulatory Visit: Payer: Medicare Other | Admitting: Student in an Organized Health Care Education/Training Program

## 2022-09-23 ENCOUNTER — Ambulatory Visit: Payer: Medicare Other | Admitting: Student in an Organized Health Care Education/Training Program

## 2022-12-26 ENCOUNTER — Telehealth: Payer: Self-pay | Admitting: Physical Medicine and Rehabilitation

## 2022-12-26 NOTE — Telephone Encounter (Signed)
Patient states she is a former patient of Dr. Louanne Skye and need to see Dr. Ernestina Patches.

## 2022-12-27 ENCOUNTER — Other Ambulatory Visit: Payer: Self-pay | Admitting: Physician Assistant

## 2022-12-27 ENCOUNTER — Other Ambulatory Visit (INDEPENDENT_AMBULATORY_CARE_PROVIDER_SITE_OTHER): Payer: Medicare Other

## 2022-12-27 ENCOUNTER — Ambulatory Visit (INDEPENDENT_AMBULATORY_CARE_PROVIDER_SITE_OTHER): Payer: Medicare Other | Admitting: Physician Assistant

## 2022-12-27 ENCOUNTER — Encounter: Payer: Self-pay | Admitting: Physician Assistant

## 2022-12-27 ENCOUNTER — Telehealth: Payer: Self-pay | Admitting: Physician Assistant

## 2022-12-27 DIAGNOSIS — M4326 Fusion of spine, lumbar region: Secondary | ICD-10-CM | POA: Diagnosis not present

## 2022-12-27 MED ORDER — CYCLOBENZAPRINE HCL 10 MG PO TABS
10.0000 mg | ORAL_TABLET | Freq: Three times a day (TID) | ORAL | 0 refills | Status: DC | PRN
Start: 2022-12-27 — End: 2023-10-30

## 2022-12-27 MED ORDER — CYCLOBENZAPRINE HCL 10 MG PO TABS: 10.0000 mg | ORAL_TABLET | Freq: Three times a day (TID) | ORAL | 0 refills | Status: DC | PRN

## 2022-12-27 MED ORDER — OXYCODONE-ACETAMINOPHEN 5-325 MG PO TABS: 1.0000 | ORAL_TABLET | Freq: Three times a day (TID) | ORAL | 0 refills | Status: DC | PRN

## 2022-12-27 MED ORDER — OXYCODONE-ACETAMINOPHEN 5-325 MG PO TABS
1.0000 | ORAL_TABLET | Freq: Three times a day (TID) | ORAL | 0 refills | Status: DC | PRN
Start: 2022-12-27 — End: 2023-10-30

## 2022-12-27 NOTE — Telephone Encounter (Signed)
Patient called in stating [facility] is closed and she cannot pick up her Rx she would like her Rx sent to [facility] on [address] please advise, patient would like done ASAP being she has no ride to pick up medication once she is dropped off at home
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
## 2022-12-27 NOTE — Telephone Encounter (Signed)
Called and cancelled other rx. Please resend

## 2022-12-30 NOTE — Telephone Encounter (Signed)
Called patient to schedule appointment for Dr. Ernestina Patches and she stated she saw Bevely Palmer on 12/27/22. I asked her if she still wanted to be seen and she stated she will always need someone to fill her muscle relaxer and pain medicine. Informed patient that Dr. Ernestina Patches does not do chronic pain management and she became upset. Attempted to inform her that we can send her to pain management and she said "don't call me no f@@@ing  more", then hung up on me.

## 2023-01-19 DIAGNOSIS — F1123 Opioid dependence with withdrawal: Secondary | ICD-10-CM | POA: Diagnosis not present

## 2023-01-26 DIAGNOSIS — F1123 Opioid dependence with withdrawal: Secondary | ICD-10-CM | POA: Diagnosis not present

## 2023-02-02 DIAGNOSIS — F1123 Opioid dependence with withdrawal: Secondary | ICD-10-CM | POA: Diagnosis not present

## 2023-02-09 DIAGNOSIS — F1123 Opioid dependence with withdrawal: Secondary | ICD-10-CM | POA: Diagnosis not present

## 2023-02-16 DIAGNOSIS — F1123 Opioid dependence with withdrawal: Secondary | ICD-10-CM | POA: Diagnosis not present

## 2023-02-23 DIAGNOSIS — F1123 Opioid dependence with withdrawal: Secondary | ICD-10-CM | POA: Diagnosis not present

## 2023-03-02 DIAGNOSIS — F1123 Opioid dependence with withdrawal: Secondary | ICD-10-CM | POA: Diagnosis not present

## 2023-03-09 DIAGNOSIS — F1123 Opioid dependence with withdrawal: Secondary | ICD-10-CM | POA: Diagnosis not present

## 2023-03-16 DIAGNOSIS — F1123 Opioid dependence with withdrawal: Secondary | ICD-10-CM | POA: Diagnosis not present

## 2023-03-23 DIAGNOSIS — F1123 Opioid dependence with withdrawal: Secondary | ICD-10-CM | POA: Diagnosis not present

## 2023-03-30 DIAGNOSIS — F1123 Opioid dependence with withdrawal: Secondary | ICD-10-CM | POA: Diagnosis not present

## 2023-04-02 ENCOUNTER — Ambulatory Visit: Payer: Medicare Other | Admitting: Dermatology

## 2023-04-06 DIAGNOSIS — F1123 Opioid dependence with withdrawal: Secondary | ICD-10-CM | POA: Diagnosis not present

## 2023-04-13 DIAGNOSIS — F1123 Opioid dependence with withdrawal: Secondary | ICD-10-CM | POA: Diagnosis not present

## 2023-04-20 DIAGNOSIS — F1123 Opioid dependence with withdrawal: Secondary | ICD-10-CM | POA: Diagnosis not present

## 2023-04-27 DIAGNOSIS — F1123 Opioid dependence with withdrawal: Secondary | ICD-10-CM | POA: Diagnosis not present

## 2023-05-04 DIAGNOSIS — F1123 Opioid dependence with withdrawal: Secondary | ICD-10-CM | POA: Diagnosis not present

## 2023-05-11 DIAGNOSIS — F1123 Opioid dependence with withdrawal: Secondary | ICD-10-CM | POA: Diagnosis not present

## 2023-05-18 DIAGNOSIS — F1123 Opioid dependence with withdrawal: Secondary | ICD-10-CM | POA: Diagnosis not present

## 2023-05-30 DIAGNOSIS — F1120 Opioid dependence, uncomplicated: Secondary | ICD-10-CM | POA: Diagnosis not present

## 2023-06-04 DIAGNOSIS — H40023 Open angle with borderline findings, high risk, bilateral: Secondary | ICD-10-CM | POA: Diagnosis not present

## 2023-06-04 DIAGNOSIS — H524 Presbyopia: Secondary | ICD-10-CM | POA: Diagnosis not present

## 2023-06-04 DIAGNOSIS — H353221 Exudative age-related macular degeneration, left eye, with active choroidal neovascularization: Secondary | ICD-10-CM | POA: Diagnosis not present

## 2023-06-04 DIAGNOSIS — H01001 Unspecified blepharitis right upper eyelid: Secondary | ICD-10-CM | POA: Diagnosis not present

## 2023-06-04 DIAGNOSIS — H353212 Exudative age-related macular degeneration, right eye, with inactive choroidal neovascularization: Secondary | ICD-10-CM | POA: Diagnosis not present

## 2023-06-05 DIAGNOSIS — H524 Presbyopia: Secondary | ICD-10-CM | POA: Diagnosis not present

## 2023-06-05 NOTE — Progress Notes (Signed)
[facility] - Clinic Note  06/17/2023   CHIEF COMPLAINT Patient presents for Retina Evaluation  HISTORY OF PRESENT ILLNESS: [name] is a 67 y.o. female who presents to the clinic today for:  HPI     Retina Evaluation   In both eyes.  This started 7 years ago.  Duration of 4 weeks.  Associated Symptoms Distortion.  Response to treatment was no improvement.  I, the attending physician,  performed the HPI with the patient and updated documentation appropriately.        Comments   New pt ret eval for ARMD OU referred by Dr. Bascom Levels. Pt states 7 years ago she was told she had a 'growth' in back of OD that caused blurry central VA OD. Pt went to see Dr. Bascom Levels and it was clarified she had 'fluid' in the back of her eyes. She's noticed the past 4-5 weeks some distortion in her [place]. She doesn't take rx meds but reports using goody powders 2-3 a day for preventative. Pt uses Blink gtts for her eye lids and takes latanoprost gtt in OU for pressure from Dr. Bascom Levels.       Last edited by Rennis Chris, MD on 06/17/2023 12:34 PM.    Pt is here on the referral of Dr. Bascom Levels for baseline ARMD exam, pt states about 7 years ago she was told she had a "growth" in the back of her right eye causing vision loss and distortion, pt was living in [place] at the time, pt states her mom had ARMD and got injections, pt is a current smoker, she also takes [facility] powders daily   Referring physician: Fraz[place], [place], OD [address] [place],  [place] [address]  HISTORICAL INFORMATION:  Selected notes from the MEDICAL RECORD NUMBER Referred by Dr. Bascom Levels for concern of ARMD  LEE:  Ocular Hx- PMH-   CURRENT MEDICATIONS: No current outpatient medications on file. (Ophthalmic Drugs)   No current facility-administered medications for this visit. (Ophthalmic Drugs)   Current Outpatient Medications (Other)  Medication Sig   albuterol (VENTOLIN HFA) 108 (90 Base) MCG/ACT inhaler  Inhale 2 puffs into the lungs every 6 (six) hours as needed for wheezing or shortness of breath.   amLODipine (NORVASC) 2.5 MG tablet Take 1 tablet (2.5 mg total) by mouth daily.   aspirin EC 81 MG tablet Take 1 tablet (81 mg total) by mouth daily. Swallow whole.   atorvastatin (LIPITOR) 40 MG tablet Take 1 tablet (40 mg total) by mouth daily at 6 PM.   cyclobenzaprine (FLEXERIL) 10 MG tablet Take 1 tablet (10 mg total) by mouth 3 (three) times daily as needed for muscle spasms.   escitalopram (LEXAPRO) 10 MG tablet Take 1 tablet (10 mg total) by mouth daily.   HYDROcodone-acetaminophen (NORCO) 7.5-325 MG tablet Take 1 tablet by mouth every 12 (twelve) hours as needed for moderate pain.   methocarbamol (ROBAXIN) 500 MG tablet Take 1 tablet (500 mg total) by mouth every 6 (six) hours as needed for muscle spasms.   metoprolol tartrate (LOPRESSOR) 25 MG tablet Take 0.5 tablets (12.5 mg total) by mouth 2 (two) times daily.   oxyCODONE-acetaminophen (PERCOCET/ROXICET) 5-325 MG tablet Take 1 tablet by mouth every 8 (eight) hours as needed for severe pain.   potassium chloride (KLOR-CON M) 10 MEQ tablet Take 1 tablet (10 mEq total) by mouth daily.   No current facility-administered medications for this visit. (Other)   REVIEW OF SYSTEMS: ROS  Positive for: Cardiovascular, Eyes Negative for: Constitutional, Gastrointestinal, Neurological, Skin, Genitourinary, Musculoskeletal, HENT, Endocrine, Respiratory, Psychiatric, Allergic/Imm, Heme/Lymph Last edited by Thompson Grayer, COT on 06/17/2023  9:14 AM.     ALLERGIES Allergies  Allergen Reactions   Acetaminophen-Codeine Other (See Comments)    Hand get red, feels like it's on fire   Morphine And Codeine     "makes me feel like i'm on fire"   Nitroglycerin     Causes a headache   Tramadol Hcl     Gives her the shakes   Azithromycin Itching and Rash   PAST MEDICAL HISTORY Past Medical History:  Diagnosis Date   Acute hepatitis C without  mention of hepatic coma(070.51)    Anemia    Arthritis    back   Carpal tunnel syndrome    Chest pain, unspecified    Chronic airway obstruction, not elsewhere classified    Chronic hepatitis C without hepatic coma (HCC) 02/09/2018   Colitis    Coronary atherosclerosis of unspecified type of vessel, native or graft    Disorders of bursae and tendons in shoulder region, unspecified    Drug-induced skin rash 04/07/2018   GERD (gastroesophageal reflux disease)    Hardware complicating wound infection (HCC) 02/09/2018   Headache    migraines   Hepatitis    hx of hepatitis C   Infective otitis externa, unspecified    Mixed hyperlipidemia    MRSA infection 02/09/2018   Nonspecific abnormal results of thyroid function study    Other bursitis disorders    Other symptoms involving nervous and musculoskeletal systems(781.99)    Pneumonia    Seizures (HCC)    none for 4 years (as of 01/2018) "pseudo seizures"   Thrush 04/07/2018   Past Surgical History:  Procedure Laterality Date   ABDOMINAL HYSTERECTOMY     CARDIAC CATHETERIZATION     CARDIAC CATHETERIZATION N/A 06/25/2016   Procedure: Left Heart Cath and Coronary Angiography;  Surgeon: Orpah Cobb, MD;  Location: [facility]AS[facility];  Service: Cardiovascular;  Laterality: N/A;   CESAREAN SECTION     CHOLECYSTECTOMY     COLONOSCOPY     IR FLUORO GUIDED NEEDLE PLC ASPIRATION/INJECTION LOC  12/27/2017   LEFT HEART CATH AND CORONARY ANGIOGRAPHY N/A 06/06/2022   Procedure: LEFT HEART CATH AND CORONARY ANGIOGRAPHY;  Surgeon: Orpah Cobb, MD;  Location: [facility];  Service: Cardiovascular;  Laterality: N/A;   LUMBAR FUSION  01/13/2018   Transforaminal lumbar interbody fusion L4-5 with screws, cages and rods, local bone graft, Allograft, Vivigen, Decompression L4-5 and L5-S1   TONSILLECTOMY     FAMILY HISTORY Family History  Problem Relation Age of Onset   Colonic polyp Mother    Thyroid disease Mother    COPD Mother    Macular  degeneration Mother    Uterine cancer Maternal Grandmother    Crohn's disease Daughter 90       colon resection   Irritable bowel syndrome Daughter    Heart murmur Daughter        SVT   Colon cancer Neg Hx    SOCIAL HISTORY Social History   Tobacco Use   Smoking status: Former    Current packs/day: 0.00    Types: Cigarettes    Start date: 06/16/1969    Quit date: 06/16/2022    Years since quitting: 1.0   Smokeless tobacco: Never   Tobacco comments:    2 cigarettes a day  Vaping Use   Vaping status: Former  Quit date: 02/26/2022  Substance Use Topics   Alcohol use: No   Drug use: No       OPHTHALMIC EXAM:  Base Eye Exam     Visual Acuity (Snellen - Linear)       Right Left   Dist cc 20/200 20/40   Dist ph cc NI NI    Correction: Glasses  New rx specs MS        Tonometry (Tonopen, 9:24 AM)       Right Left   Pressure 14 15         Pupils       Pupils Dark Light Shape React APD   Right PERRL 3 2 Round Brisk None   Left PERRL 3 2 Round Brisk None         Visual Fields       Left Right    Full Full         Neuro/Psych     Oriented x3: Yes   Mood/Affect: Normal         Dilation     Both eyes: 1.0% Mydriacyl, 2.5% Phenylephrine @ 9:25 AM           Slit Lamp and Fundus Exam     Slit Lamp Exam       Right Left   Lids/Lashes Dermatochalasis - upper lid Dermatochalasis - upper lid   Conjunctiva/Sclera nasal and temporal pinguecula nasal and temporal pinguecula   Cornea 2+ Punctate epithelial erosions, tear film debris 2+ Punctate epithelial erosions, tear film debris   Anterior Chamber deep and clear deep and clear   Iris Round and dilated Round and dilated   Lens 2+ Nuclear sclerosis, 2+ Cortical cataract 2+ Nuclear sclerosis, 2+ Cortical cataract   Anterior Vitreous mild syneresis mild syneresis, Posterior vitreous detachment         Fundus Exam       Right Left   Disc Pink and Sharp, +cupping Pink and Sharp, PPA   C/D  Ratio 0.6 0.6   Macula Blunted foveal reflex, central subretinal fibrosis / scar with surrounding atrophy, +pigment clumping, no heme or edema Blunted foveal reflex, central CNV with edema and shallow SRF, no frank heme   Vessels attenuated attenuated   Periphery Attached Attached           Refraction     Wearing Rx       Sphere Cylinder Axis   Right -2.75 +1.50 115   Left -2.75 +1.50 118         Manifest Refraction       Sphere Cylinder Axis Dist VA   Right -2.75 +1.50 135 20/200   Left -2.25 +1.50 120 20/30-1           IMAGING AND PROCEDURES  Imaging and Procedures for 06/17/2023  OCT, Retina - OU - Both Eyes       Right Eye Quality was good. Central Foveal Thickness: 306. Progression has no prior data. Findings include no IRF, no SRF, abnormal foveal contour, retinal drusen [facility] -- large central disciform scar; no fluid).   Left Eye Quality was good. Central Foveal Thickness: 387. Progression has no prior data. Findings include no IRF, abnormal foveal contour, retinal drusen , subretinal hyper-reflective material, choroidal neovascular membrane, subretinal fluid (Central CNV with edema and shallow SRF).   Notes *Images captured and stored on drive  Diagnosis / Impression:  Exudative ARMD OU OD: Central SRHM -- large central disciform scar; no fluid OS: Central  CNV with edema and shallow SRF  Clinical management:  See below  Abbreviations: NFP - Normal foveal profile. CME - cystoid macular edema. PED - pigment epithelial detachment. IRF - intraretinal fluid. SRF - subretinal fluid. EZ - ellipsoid zone. ERM - epiretinal membrane. ORA - outer retinal atrophy. ORT - outer retinal tubulation. SRHM - subretinal hyper-reflective material. IRHM - intraretinal hyper-reflective material      Intravitreal Injection, Pharmacologic Agent - OS - Left Eye       Time Out 06/17/2023. 9:55 AM. Confirmed correct patient, procedure, site, and patient consented.    Anesthesia Topical anesthesia was used. Anesthetic medications included Lidocaine 2%, Proparacaine 0.5%.   Procedure Preparation included 5% betadine to ocular surface, eyelid speculum. A supplied needle was used.   Injection: 1.25 mg Bevacizumab 1.25mg /0.12ml   Route: Intravitreal, Site: Left Eye   NDC: [id number], Lot: [id number], Expiration date: 07/14/2023   Post-op Post injection exam found visual acuity of at least counting fingers. The patient tolerated the procedure well. There were no complications. The patient received written and verbal post procedure care education.           ASSESSMENT/PLAN:   ICD-10-CM   1. Exudative age-related macular degeneration of both eyes with active choroidal neovascularization (HCC)  H35.3231 OCT, Retina - OU - Both Eyes    Intravitreal Injection, Pharmacologic Agent - OS - Left Eye    Bevacizumab (AVASTIN) SOLN 1.25 mg    2. Combined forms of age-related cataract of both eyes  H25.813     3. Essential hypertension  I10     4. Hypertensive retinopathy of both eyes  H35.033      Exudative age related macular degeneration, both eyes - OD -- inactive with disciform scar - OS w/ central edema and shallow SRF - pt endorses new onset distortion / metamorphopsia OS   - BCVA OD 20/200; OS 20/30 (Mrx)  - The incidence pathology and anatomy of wet AMD discussed   - The ANCHOR, MARINA, CATT and VIEW trials discussed with patient.    - discussed treatment options including observation vs intravitreal anti-VEGF agents such as Avastin, Lucentis, Eylea.    - Risks of endophthalmitis and vascular occlusive events and atrophic changes discussed with patient  - OCT shows OD: Central SRHM -- large central disciform scar; no fluid; OS: Central CNV with edema and shallow SRF  - recommend IVA OS #1 today, 09.03.24  - pt wishes to proceed with injection  - RBA of procedure discussed, questions answered - IVA informed consent obtained and signed, 09.03.24  (OS) - see procedure note  - f/u in 4 wks, DFE, OCT  2,3. Hypertensive retinopathy OU - discussed importance of tight BP control - monitor   4. Mixed Cataract OU - The symptoms of cataract, surgical options, and treatments and risks were discussed with patient. - discussed diagnosis and progression - monitor  Ophthalmic Meds Ordered this visit:  Meds ordered this encounter  Medications   Bevacizumab (AVASTIN) SOLN 1.25 mg     Return in about 4 weeks (around 07/15/2023) for f/u exu ARMD OS, DFE, OCT.  There are no Patient Instructions on file for this visit.  Explained the diagnoses, plan, and follow up with the patient and they expressed understanding.  Patient expressed understanding of the importance of proper follow up care.   This document serves as a record of services personally performed by Karie Chimera, MD, PhD. It was created on their behalf by Laurey Morale, COT an ophthalmic  technician. The creation of this record is the provider's dictation and/or activities during the visit.    Electronically signed by:  Charlette Caffey, COT  06/17/23 12:39 PM   Karie Chimera, M.D., Ph.D. Diseases & Surgery of the Retina and Vitreous [facility] 06/17/2023  I have reviewed the above documentation for accuracy and completeness, and I agree with the above. Karie Chimera, M.D., Ph.D. 06/17/23 12:39 PM   Abbreviations: M myopia (nearsighted); A astigmatism; H hyperopia (farsighted); P presbyopia; Mrx spectacle prescription;  CTL contact lenses; OD right eye; OS left eye; OU both eyes  XT exotropia; ET esotropia; PEK punctate epithelial keratitis; PEE punctate epithelial erosions; DES dry eye syndrome; MGD meibomian gland dysfunction; ATs artificial tears; PFAT's preservative free artificial tears; NSC nuclear sclerotic cataract; PSC posterior subcapsular cataract; ERM epi-retinal membrane; PVD posterior vitreous detachment; RD retinal detachment; DM  diabetes mellitus; DR diabetic retinopathy; NPDR non-proliferative diabetic retinopathy; PDR proliferative diabetic retinopathy; CSME clinically significant macular edema; DME diabetic macular edema; dbh dot blot hemorrhages; CWS cotton wool spot; POAG primary open angle glaucoma; C/D cup-to-disc ratio; HVF humphrey visual field; GVF goldmann visual field; OCT optical coherence tomography; IOP intraocular pressure; BRVO Branch retinal vein occlusion; CRVO central retinal vein occlusion; CRAO central retinal artery occlusion; BRAO branch retinal artery occlusion; RT retinal tear; SB scleral buckle; PPV pars plana vitrectomy; VH Vitreous hemorrhage; PRP panretinal laser photocoagulation; IVK intravitreal kenalog; VMT vitreomacular traction; MH Macular hole;  NVD neovascularization of the disc; NVE neovascularization elsewhere; AREDS age related eye disease study; ARMD age related macular degeneration; POAG primary open angle glaucoma; EBMD epithelial/anterior basement membrane dystrophy; ACIOL anterior chamber intraocular lens; IOL intraocular lens; PCIOL posterior chamber intraocular lens; Phaco/IOL phacoemulsification with intraocular lens placement; PRK photorefractive keratectomy; LASIK laser assisted in situ keratomileusis; HTN hypertension; DM diabetes mellitus; COPD chronic obstructive pulmonary disease

## 2023-06-06 DIAGNOSIS — F1120 Opioid dependence, uncomplicated: Secondary | ICD-10-CM | POA: Diagnosis not present

## 2023-06-13 DIAGNOSIS — F1120 Opioid dependence, uncomplicated: Secondary | ICD-10-CM | POA: Diagnosis not present

## 2023-06-17 ENCOUNTER — Ambulatory Visit (INDEPENDENT_AMBULATORY_CARE_PROVIDER_SITE_OTHER): Payer: Medicare HMO | Admitting: Ophthalmology

## 2023-06-17 ENCOUNTER — Encounter (INDEPENDENT_AMBULATORY_CARE_PROVIDER_SITE_OTHER): Payer: Self-pay | Admitting: Ophthalmology

## 2023-06-17 DIAGNOSIS — H35033 Hypertensive retinopathy, bilateral: Secondary | ICD-10-CM | POA: Diagnosis not present

## 2023-06-17 DIAGNOSIS — H25813 Combined forms of age-related cataract, bilateral: Secondary | ICD-10-CM

## 2023-06-17 DIAGNOSIS — H353231 Exudative age-related macular degeneration, bilateral, with active choroidal neovascularization: Secondary | ICD-10-CM

## 2023-06-17 DIAGNOSIS — H3581 Retinal edema: Secondary | ICD-10-CM

## 2023-06-17 DIAGNOSIS — I10 Essential (primary) hypertension: Secondary | ICD-10-CM | POA: Diagnosis not present

## 2023-06-17 DIAGNOSIS — J449 Chronic obstructive pulmonary disease, unspecified: Secondary | ICD-10-CM | POA: Diagnosis not present

## 2023-06-17 MED ORDER — BEVACIZUMAB CHEMO INJECTION 1.25MG/0.05ML SYRINGE FOR KALEIDOSCOPE
1.2500 mg | INTRAVITREAL | Status: AC | PRN
  Administered 2023-06-17: 1.25 mg via INTRAVITREAL

## 2023-06-20 DIAGNOSIS — F1120 Opioid dependence, uncomplicated: Secondary | ICD-10-CM | POA: Diagnosis not present

## 2023-06-27 DIAGNOSIS — F1120 Opioid dependence, uncomplicated: Secondary | ICD-10-CM | POA: Diagnosis not present

## 2023-07-03 NOTE — Progress Notes (Signed)
[facility] - Clinic Note  07/15/2023   CHIEF COMPLAINT Patient presents for Retina Follow Up  HISTORY OF PRESENT ILLNESS: [name] is a 67 y.o. female who presents to the clinic today for:  HPI     Retina Follow Up   Patient presents with  Wet AMD.  In left eye.  This started 4 weeks ago.  I, the attending physician,  performed the HPI with the patient and updated documentation appropriately.        Comments   Patient here for 4 weeks retina follow up for exu ARMD OS. Patient states vision good as to be expected. No eye pain. Finishing drop Xdemvy BID OU has 1 more day and using Latanoprost QHS OU. Forgot glasses.      Last edited by Rennis Chris, MD on 07/15/2023 11:38 AM.    Pt states she had no problems after her first injection at last visit, she feels like her vision has improved  Referring physician: Orpah Cobb, MD [address] [place],  [place] [address]  HISTORICAL INFORMATION:  Selected notes from the MEDICAL RECORD NUMBER Referred by Dr. Bascom Levels for concern of ARMD  LEE:  Ocular Hx- PMH-   CURRENT MEDICATIONS: No current outpatient medications on file. (Ophthalmic Drugs)   No current facility-administered medications for this visit. (Ophthalmic Drugs)   Current Outpatient Medications (Other)  Medication Sig   albuterol (VENTOLIN HFA) 108 (90 Base) MCG/ACT inhaler Inhale 2 puffs into the lungs every 6 (six) hours as needed for wheezing or shortness of breath.   amLODipine (NORVASC) 2.5 MG tablet Take 1 tablet (2.5 mg total) by mouth daily.   aspirin EC 81 MG tablet Take 1 tablet (81 mg total) by mouth daily. Swallow whole.   atorvastatin (LIPITOR) 40 MG tablet Take 1 tablet (40 mg total) by mouth daily at 6 PM.   cyclobenzaprine (FLEXERIL) 10 MG tablet Take 1 tablet (10 mg total) by mouth 3 (three) times daily as needed for muscle spasms.   HYDROcodone-acetaminophen (NORCO) 7.5-325 MG tablet Take 1 tablet by mouth every 12  (twelve) hours as needed for moderate pain.   methocarbamol (ROBAXIN) 500 MG tablet Take 1 tablet (500 mg total) by mouth every 6 (six) hours as needed for muscle spasms.   metoprolol tartrate (LOPRESSOR) 25 MG tablet Take 0.5 tablets (12.5 mg total) by mouth 2 (two) times daily.   oxyCODONE-acetaminophen (PERCOCET/ROXICET) 5-325 MG tablet Take 1 tablet by mouth every 8 (eight) hours as needed for severe pain.   potassium chloride (KLOR-CON M) 10 MEQ tablet Take 1 tablet (10 mEq total) by mouth daily.   escitalopram (LEXAPRO) 10 MG tablet Take 1 tablet (10 mg total) by mouth daily.   No current facility-administered medications for this visit. (Other)   REVIEW OF SYSTEMS: ROS   Positive for: Cardiovascular, Eyes Negative for: Constitutional, Gastrointestinal, Neurological, Skin, Genitourinary, Musculoskeletal, HENT, Endocrine, Respiratory, Psychiatric, Allergic/Imm, Heme/Lymph Last edited by Laddie Aquas, COA on 07/15/2023  8:53 AM.      ALLERGIES Allergies  Allergen Reactions   Acetaminophen-Codeine Other (See Comments)    Hand get red, feels like it's on fire   Morphine And Codeine     "makes me feel like i'm on fire"   Nitroglycerin     Causes a headache   Tramadol Hcl     Gives her the shakes   Azithromycin Itching and Rash   PAST MEDICAL HISTORY Past Medical History:  Diagnosis Date  Acute hepatitis C without mention of hepatic coma(070.51)    Anemia    Arthritis    back   Carpal tunnel syndrome    Chest pain, unspecified    Chronic airway obstruction, not elsewhere classified    Chronic hepatitis C without hepatic coma (HCC) 02/09/2018   Colitis    Coronary atherosclerosis of unspecified type of vessel, native or graft    Disorders of bursae and tendons in shoulder region, unspecified    Drug-induced skin rash 04/07/2018   GERD (gastroesophageal reflux disease)    Hardware complicating wound infection (HCC) 02/09/2018   Headache    migraines   Hepatitis    hx  of hepatitis C   Infective otitis externa, unspecified    Mixed hyperlipidemia    MRSA infection 02/09/2018   Nonspecific abnormal results of thyroid function study    Other bursitis disorders    Other symptoms involving nervous and musculoskeletal systems(781.99)    Pneumonia    Seizures (HCC)    none for 4 years (as of 01/2018) "pseudo seizures"   Thrush 04/07/2018   Past Surgical History:  Procedure Laterality Date   ABDOMINAL HYSTERECTOMY     CARDIAC CATHETERIZATION     CARDIAC CATHETERIZATION N/A 06/25/2016   Procedure: Left Heart Cath and Coronary Angiography;  Surgeon: Orpah Cobb, MD;  Location: [facility];  Service: Cardiovascular;  Laterality: N/A;   CESAREAN SECTION     CHOLECYSTECTOMY     COLONOSCOPY     IR FLUORO GUIDED NEEDLE PLC ASPIRATION/INJECTION LOC  12/27/2017   LEFT HEART CATH AND CORONARY ANGIOGRAPHY N/A 06/06/2022   Procedure: LEFT HEART CATH AND CORONARY ANGIOGRAPHY;  Surgeon: Orpah Cobb, MD;  Location: [facility];  Service: Cardiovascular;  Laterality: N/A;   LUMBAR FUSION  01/13/2018   Transforaminal lumbar interbody fusion L4-5 with screws, cages and rods, local bone graft, Allograft, Vivigen, Decompression L4-5 and L5-S1   TONSILLECTOMY     FAMILY HISTORY Family History  Problem Relation Age of Onset   Colonic polyp Mother    Thyroid disease Mother    COPD Mother    Macular degeneration Mother    Uterine cancer Maternal Grandmother    Crohn's disease Daughter 20       colon resection   Irritable bowel syndrome Daughter    Heart murmur Daughter        SVT   Colon cancer Neg Hx    SOCIAL HISTORY Social History   Tobacco Use   Smoking status: Former    Current packs/day: 0.00    Types: Cigarettes    Start date: 06/16/1969    Quit date: 06/16/2022    Years since quitting: 1.0   Smokeless tobacco: Never   Tobacco comments:    2 cigarettes a day  Vaping Use   Vaping status: Former   Quit date: 02/26/2022  Substance Use Topics    Alcohol use: No   Drug use: No       OPHTHALMIC EXAM:  Base Eye Exam     Visual Acuity (Snellen - Linear)       Right Left   Dist [place] 20/250 -2 20/40 -2   Dist ph [place] 20/200 NI  Patient forgot glasses.        Tonometry (Tonopen, 8:49 AM)       Right Left   Pressure 16 13         Pupils       Dark Light Shape React APD  Right 3 2 Round Brisk None   Left 3 2 Round Brisk None         Visual Fields (Counting fingers)       Left Right    Full Full         Extraocular Movement       Right Left    Full, Ortho Full, Ortho         Neuro/Psych     Oriented x3: Yes   Mood/Affect: Normal         Dilation     Both eyes: 1.0% Mydriacyl, 2.5% Phenylephrine @ 8:49 AM           Slit Lamp and Fundus Exam     Slit Lamp Exam       Right Left   Lids/Lashes Dermatochalasis - upper lid Dermatochalasis - upper lid   Conjunctiva/Sclera nasal and temporal pinguecula nasal and temporal pinguecula   Cornea 2+ Punctate epithelial erosions, tear film debris 2+ Punctate epithelial erosions, tear film debris   Anterior Chamber deep and clear deep and clear   Iris Round and dilated Round and dilated   Lens 2+ Nuclear sclerosis, 2+ Cortical cataract 2+ Nuclear sclerosis, 2+ Cortical cataract   Anterior Vitreous mild syneresis mild syneresis, Posterior vitreous detachment         Fundus Exam       Right Left   Disc Pink and Sharp, +cupping Pink and Sharp, PPA   C/D Ratio 0.6 0.6   Macula Blunted foveal reflex, central subretinal fibrosis / scar with surrounding atrophy, +pigment clumping, no heme or edema Blunted foveal reflex, central CNV with edema and shallow SRF -- improved, no frank heme, RPE mottling and atrophy   Vessels attenuated attenuated   Periphery Attached, No heme Attached, No heme           Refraction     Wearing Rx       Sphere Cylinder Axis   Right -2.75 +1.50 115   Left -2.75 +1.50 118           IMAGING AND PROCEDURES   Imaging and Procedures for 07/15/2023  OCT, Retina - OU - Both Eyes       Right Eye Quality was good. Central Foveal Thickness: 300. Progression has been stable. Findings include no IRF, no SRF, abnormal foveal contour, retinal drusen [facility] -- large central disciform scar; no fluid).   Left Eye Quality was good. Central Foveal Thickness: 369. Progression has improved. Findings include no IRF, abnormal foveal contour, retinal drusen , subretinal hyper-reflective material, choroidal neovascular membrane, subretinal fluid (Central CNV with interval improvement in edema and shallow SRF ).   Notes *Images captured and stored on drive  Diagnosis / Impression:  Exudative ARMD OU OD: Central SRHM -- large central disciform scar; no fluid OS: Central CNV with interval improvement in edema and shallow SRF   Clinical management:  See below  Abbreviations: NFP - Normal foveal profile. CME - cystoid macular edema. PED - pigment epithelial detachment. IRF - intraretinal fluid. SRF - subretinal fluid. EZ - ellipsoid zone. ERM - epiretinal membrane. ORA - outer retinal atrophy. ORT - outer retinal tubulation. SRHM - subretinal hyper-reflective material. IRHM - intraretinal hyper-reflective material      Intravitreal Injection, Pharmacologic Agent - OS - Left Eye       Time Out 07/15/2023. 9:24 AM. Confirmed correct patient, procedure, site, and patient consented.   Anesthesia Topical anesthesia was used. Anesthetic medications included Lidocaine 2%, Proparacaine  0.5%.   Procedure Preparation included 5% betadine to ocular surface, eyelid speculum. A supplied needle was used.   Injection: 1.25 mg Bevacizumab 1.25mg /0.34ml   Route: Intravitreal, Site: Left Eye   NDC: [id number], Lot: [id number], Expiration date: 08/23/2023   Post-op Post injection exam found visual acuity of at least counting fingers. The patient tolerated the procedure well. There were no complications. The patient  received written and verbal post procedure care education.           ASSESSMENT/PLAN:   ICD-10-CM   1. Exudative age-related macular degeneration of both eyes with active choroidal neovascularization (HCC)  H35.3231 OCT, Retina - OU - Both Eyes    Intravitreal Injection, Pharmacologic Agent - OS - Left Eye    Bevacizumab (AVASTIN) SOLN 1.25 mg    2. Combined forms of age-related cataract of both eyes  H25.813     3. Essential hypertension  I10     4. Hypertensive retinopathy of both eyes  H35.033      Exudative age related macular degeneration, both eyes - OD -- inactive with disciform scar - OS w/ central edema and shallow SRF - s/p IVA OS #1 (09.03.24) - BCVA OD 20/200; OS 20/40 -- stable OU  - OCT shows OD: Central SRHM -- large central disciform scar; no fluid; OS: Central CNV with interval improvement in edema and shallow SRF   - recommend IVA OS #2 today, 10.01.24  - pt wishes to proceed with injection  - RBA of procedure discussed, questions answered - IVA informed consent obtained and signed, 09.03.24 (OS) - see procedure note  - f/u in 4 wks, DFE, OCT  2,3. Hypertensive retinopathy OU - discussed importance of tight BP control - monitor   4. Mixed Cataract OU - The symptoms of cataract, surgical options, and treatments and risks were discussed with patient. - discussed diagnosis and progression - monitor  Ophthalmic Meds Ordered this visit:  Meds ordered this encounter  Medications   Bevacizumab (AVASTIN) SOLN 1.25 mg     Return in about 4 weeks (around 08/12/2023) for f/u exu ARMD OU, DFE, OCT.  There are no Patient Instructions on file for this visit.  Explained the diagnoses, plan, and follow up with the patient and they expressed understanding.  Patient expressed understanding of the importance of proper follow up care.   This document serves as a record of services personally performed by Karie Chimera, MD, PhD. It was created on their behalf by  Laurey Morale, COT an ophthalmic technician. The creation of this record is the provider's dictation and/or activities during the visit.    Electronically signed by:  Charlette Caffey, COT  07/15/23 11:40 AM  This document serves as a record of services personally performed by Karie Chimera, MD, PhD. It was created on their behalf by Glee Arvin. Manson Passey, OA an ophthalmic technician. The creation of this record is the provider's dictation and/or activities during the visit.    Electronically signed by: Glee Arvin. Manson Passey, OA 07/15/23 11:40 AM  Karie Chimera, M.D., Ph.D. Diseases & Surgery of the Retina and Vitreous [facility] 07/15/2023  I have reviewed the above documentation for accuracy and completeness, and I agree with the above. Karie Chimera, M.D., Ph.D. 07/15/23 11:41 AM  Abbreviations: M myopia (nearsighted); A astigmatism; H hyperopia (farsighted); P presbyopia; Mrx spectacle prescription;  CTL contact lenses; OD right eye; OS left eye; OU both eyes  XT exotropia; ET esotropia; PEK punctate  epithelial keratitis; PEE punctate epithelial erosions; DES dry eye syndrome; MGD meibomian gland dysfunction; ATs artificial tears; PFAT's preservative free artificial tears; NSC nuclear sclerotic cataract; PSC posterior subcapsular cataract; ERM epi-retinal membrane; PVD posterior vitreous detachment; RD retinal detachment; DM diabetes mellitus; DR diabetic retinopathy; NPDR non-proliferative diabetic retinopathy; PDR proliferative diabetic retinopathy; CSME clinically significant macular edema; DME diabetic macular edema; dbh dot blot hemorrhages; CWS cotton wool spot; POAG primary open angle glaucoma; C/D cup-to-disc ratio; HVF humphrey visual field; GVF goldmann visual field; OCT optical coherence tomography; IOP intraocular pressure; BRVO Branch retinal vein occlusion; CRVO central retinal vein occlusion; CRAO central retinal artery occlusion; BRAO branch retinal artery  occlusion; RT retinal tear; SB scleral buckle; PPV pars plana vitrectomy; VH Vitreous hemorrhage; PRP panretinal laser photocoagulation; IVK intravitreal kenalog; VMT vitreomacular traction; MH Macular hole;  NVD neovascularization of the disc; NVE neovascularization elsewhere; AREDS age related eye disease study; ARMD age related macular degeneration; POAG primary open angle glaucoma; EBMD epithelial/anterior basement membrane dystrophy; ACIOL anterior chamber intraocular lens; IOL intraocular lens; PCIOL posterior chamber intraocular lens; Phaco/IOL phacoemulsification with intraocular lens placement; PRK photorefractive keratectomy; LASIK laser assisted in situ keratomileusis; HTN hypertension; DM diabetes mellitus; COPD chronic obstructive pulmonary disease

## 2023-07-07 DIAGNOSIS — H40023 Open angle with borderline findings, high risk, bilateral: Secondary | ICD-10-CM | POA: Diagnosis not present

## 2023-07-07 DIAGNOSIS — H01004 Unspecified blepharitis left upper eyelid: Secondary | ICD-10-CM | POA: Diagnosis not present

## 2023-07-07 DIAGNOSIS — H01001 Unspecified blepharitis right upper eyelid: Secondary | ICD-10-CM | POA: Diagnosis not present

## 2023-07-11 DIAGNOSIS — F1120 Opioid dependence, uncomplicated: Secondary | ICD-10-CM | POA: Diagnosis not present

## 2023-07-15 ENCOUNTER — Ambulatory Visit (INDEPENDENT_AMBULATORY_CARE_PROVIDER_SITE_OTHER): Payer: Medicare HMO | Admitting: Ophthalmology

## 2023-07-15 ENCOUNTER — Encounter (INDEPENDENT_AMBULATORY_CARE_PROVIDER_SITE_OTHER): Payer: Self-pay | Admitting: Ophthalmology

## 2023-07-15 DIAGNOSIS — H25813 Combined forms of age-related cataract, bilateral: Secondary | ICD-10-CM | POA: Diagnosis not present

## 2023-07-15 DIAGNOSIS — H353231 Exudative age-related macular degeneration, bilateral, with active choroidal neovascularization: Secondary | ICD-10-CM | POA: Diagnosis not present

## 2023-07-15 DIAGNOSIS — I10 Essential (primary) hypertension: Secondary | ICD-10-CM | POA: Diagnosis not present

## 2023-07-15 DIAGNOSIS — H35033 Hypertensive retinopathy, bilateral: Secondary | ICD-10-CM | POA: Diagnosis not present

## 2023-07-15 MED ORDER — BEVACIZUMAB CHEMO INJECTION 1.25MG/0.05ML SYRINGE FOR KALEIDOSCOPE
1.2500 mg | INTRAVITREAL | Status: AC | PRN
  Administered 2023-07-15: 1.25 mg via INTRAVITREAL

## 2023-07-18 DIAGNOSIS — F1120 Opioid dependence, uncomplicated: Secondary | ICD-10-CM | POA: Diagnosis not present

## 2023-07-25 DIAGNOSIS — F1120 Opioid dependence, uncomplicated: Secondary | ICD-10-CM | POA: Diagnosis not present

## 2023-07-30 NOTE — Progress Notes (Signed)
[facility] - Clinic Note  08/12/2023   CHIEF COMPLAINT Patient presents for Retina Follow Up  HISTORY OF PRESENT ILLNESS: [name] is a 67 y.o. female who presents to the clinic today for:  HPI     Retina Follow Up   Patient presents with  Wet AMD.  In both eyes.  This started 4 weeks ago.  Duration of 4 weeks.  Since onset it is stable.  I, the attending physician,  performed the HPI with the patient and updated documentation appropriately.        Comments   4 week retina follow up AMD OU and IVA OS pt is reporting vision seems little better she is having some dryness in the left eye she is using xdemvy bid ou and latanoprost ou at bedtime       Last edited by Rennis Chris, MD on 08/12/2023  9:27 AM.     Pt states vision has improved in both eyes  Referring physician: Orpah Cobb, MD [address] [place],  [place] [address]  HISTORICAL INFORMATION:  Selected notes from the MEDICAL RECORD NUMBER Referred by Dr. Bascom Levels for concern of ARMD  LEE:  Ocular Hx- PMH-   CURRENT MEDICATIONS: No current outpatient medications on file. (Ophthalmic Drugs)   No current facility-administered medications for this visit. (Ophthalmic Drugs)   Current Outpatient Medications (Other)  Medication Sig   albuterol (VENTOLIN HFA) 108 (90 Base) MCG/ACT inhaler Inhale 2 puffs into the lungs every 6 (six) hours as needed for wheezing or shortness of breath.   amLODipine (NORVASC) 2.5 MG tablet Take 1 tablet (2.5 mg total) by mouth daily.   aspirin EC 81 MG tablet Take 1 tablet (81 mg total) by mouth daily. Swallow whole.   atorvastatin (LIPITOR) 40 MG tablet Take 1 tablet (40 mg total) by mouth daily at 6 PM.   cyclobenzaprine (FLEXERIL) 10 MG tablet Take 1 tablet (10 mg total) by mouth 3 (three) times daily as needed for muscle spasms.   escitalopram (LEXAPRO) 10 MG tablet Take 1 tablet (10 mg total) by mouth daily.   HYDROcodone-acetaminophen (NORCO) 7.5-325  MG tablet Take 1 tablet by mouth every 12 (twelve) hours as needed for moderate pain.   methocarbamol (ROBAXIN) 500 MG tablet Take 1 tablet (500 mg total) by mouth every 6 (six) hours as needed for muscle spasms.   metoprolol tartrate (LOPRESSOR) 25 MG tablet Take 0.5 tablets (12.5 mg total) by mouth 2 (two) times daily.   oxyCODONE-acetaminophen (PERCOCET/ROXICET) 5-325 MG tablet Take 1 tablet by mouth every 8 (eight) hours as needed for severe pain.   potassium chloride (KLOR-CON M) 10 MEQ tablet Take 1 tablet (10 mEq total) by mouth daily.   No current facility-administered medications for this visit. (Other)   REVIEW OF SYSTEMS: ROS   Positive for: Cardiovascular, Eyes Negative for: Constitutional, Gastrointestinal, Neurological, Skin, Genitourinary, Musculoskeletal, HENT, Endocrine, Respiratory, Psychiatric, Allergic/Imm, Heme/Lymph Last edited by Etheleen Mayhew, COT on 08/12/2023  8:33 AM.       ALLERGIES Allergies  Allergen Reactions   Acetaminophen-Codeine Other (See Comments)    Hand get red, feels like it's on fire   Morphine And Codeine     "makes me feel like i'm on fire"   Nitroglycerin     Causes a headache   Tramadol Hcl     Gives her the shakes   Azithromycin Itching and Rash   PAST MEDICAL HISTORY Past Medical History:  Diagnosis Date  Acute hepatitis C without mention of hepatic coma(070.51)    Anemia    Arthritis    back   Carpal tunnel syndrome    Chest pain, unspecified    Chronic airway obstruction, not elsewhere classified    Chronic hepatitis C without hepatic coma (HCC) 02/09/2018   Colitis    Coronary atherosclerosis of unspecified type of vessel, native or graft    Disorders of bursae and tendons in shoulder region, unspecified    Drug-induced skin rash 04/07/2018   GERD (gastroesophageal reflux disease)    Hardware complicating wound infection (HCC) 02/09/2018   Headache    migraines   Hepatitis    hx of hepatitis C   Infective  otitis externa, unspecified    Mixed hyperlipidemia    MRSA infection 02/09/2018   Nonspecific abnormal results of thyroid function study    Other bursitis disorders    Other symptoms involving nervous and musculoskeletal systems(781.99)    Pneumonia    Seizures (HCC)    none for 4 years (as of 01/2018) "pseudo seizures"   Thrush 04/07/2018   Past Surgical History:  Procedure Laterality Date   ABDOMINAL HYSTERECTOMY     CARDIAC CATHETERIZATION     CARDIAC CATHETERIZATION N/A 06/25/2016   Procedure: Left Heart Cath and Coronary Angiography;  Surgeon: Orpah Cobb, MD;  Location: [facility];  Service: Cardiovascular;  Laterality: N/A;   CESAREAN SECTION     CHOLECYSTECTOMY     COLONOSCOPY     IR FLUORO GUIDED NEEDLE PLC ASPIRATION/INJECTION LOC  12/27/2017   LEFT HEART CATH AND CORONARY ANGIOGRAPHY N/A 06/06/2022   Procedure: LEFT HEART CATH AND CORONARY ANGIOGRAPHY;  Surgeon: Orpah Cobb, MD;  Location: [facility];  Service: Cardiovascular;  Laterality: N/A;   LUMBAR FUSION  01/13/2018   Transforaminal lumbar interbody fusion L4-5 with screws, cages and rods, local bone graft, Allograft, Vivigen, Decompression L4-5 and L5-S1   TONSILLECTOMY     FAMILY HISTORY Family History  Problem Relation Age of Onset   Colonic polyp Mother    Thyroid disease Mother    COPD Mother    Macular degeneration Mother    Uterine cancer Maternal Grandmother    Crohn's disease Daughter 83       colon resection   Irritable bowel syndrome Daughter    Heart murmur Daughter        SVT   Colon cancer Neg Hx    SOCIAL HISTORY Social History   Tobacco Use   Smoking status: Former    Current packs/day: 0.00    Types: Cigarettes    Start date: 06/16/1969    Quit date: 06/16/2022    Years since quitting: 1.1   Smokeless tobacco: Never   Tobacco comments:    2 cigarettes a day  Vaping Use   Vaping status: Former   Quit date: 02/26/2022  Substance Use Topics   Alcohol use: No   Drug  use: No       OPHTHALMIC EXAM:  Base Eye Exam     Visual Acuity (Snellen - Linear)       Right Left   Dist [place] 20/200 20/40 -2   Dist ph cc NI NI    Correction: Glasses         Tonometry (Tonopen, 8:47 AM)       Right Left   Pressure 13 14         Pupils       Pupils Dark Light Shape React APD  Right PERRL 3 2 Round Brisk None   Left PERRL 3 2 Round Brisk None         Visual Fields       Left Right    Full Full         Extraocular Movement       Right Left    Full, Ortho Full, Ortho         Neuro/Psych     Oriented x3: Yes   Mood/Affect: Normal         Dilation     Both eyes: 2.5% Phenylephrine @ 8:42 AM           Slit Lamp and Fundus Exam     Slit Lamp Exam       Right Left   Lids/Lashes Dermatochalasis - upper lid Dermatochalasis - upper lid   Conjunctiva/Sclera nasal and temporal pinguecula nasal and temporal pinguecula   Cornea 2+ Punctate epithelial erosions, tear film debris 2+ Punctate epithelial erosions, tear film debris   Anterior Chamber deep and clear deep and clear   Iris Round and dilated Round and dilated   Lens 2+ Nuclear sclerosis, 2+ Cortical cataract 2+ Nuclear sclerosis, 2+ Cortical cataract   Anterior Vitreous mild syneresis mild syneresis, Posterior vitreous detachment         Fundus Exam       Right Left   Disc Pink and Sharp, +cupping Pink and Sharp, PPA   C/D Ratio 0.6 0.6   Macula Blunted foveal reflex, central subretinal fibrosis / scar with surrounding atrophy, +pigment clumping, no heme or edema Blunted foveal reflex, central CNV with edema and shallow SRF -- persistent, no frank heme, RPE mottling and atrophy   Vessels attenuated attenuated   Periphery Attached, No heme Attached, No heme           Refraction     Wearing Rx       Sphere Cylinder Axis   Right -2.75 +1.50 115   Left -2.75 +1.50 118           IMAGING AND PROCEDURES  Imaging and Procedures for 08/12/2023  OCT,  Retina - OU - Both Eyes       Right Eye Quality was good. Central Foveal Thickness: 275. Progression has been stable. Findings include no IRF, no SRF, abnormal foveal contour, retinal drusen , outer retinal atrophy [facility] -- large central disciform scar; no fluid).   Left Eye Quality was good. Central Foveal Thickness: 403. Progression has been stable. Findings include no IRF, abnormal foveal contour, retinal drusen , subretinal hyper-reflective material, choroidal neovascular membrane, subretinal fluid (Central CNV with persistent edema and shallow SRF ).   Notes *Images captured and stored on drive  Diagnosis / Impression:  Exudative ARMD OU OD: Central SRHM -- large central disciform scar; no fluid OS: Central CNV with persistent shallow SRF   Clinical management:  See below  Abbreviations: NFP - Normal foveal profile. CME - cystoid macular edema. PED - pigment epithelial detachment. IRF - intraretinal fluid. SRF - subretinal fluid. EZ - ellipsoid zone. ERM - epiretinal membrane. ORA - outer retinal atrophy. ORT - outer retinal tubulation. SRHM - subretinal hyper-reflective material. IRHM - intraretinal hyper-reflective material      Intravitreal Injection, Pharmacologic Agent - OS - Left Eye       Time Out 08/12/2023. 9:28 AM. Confirmed correct patient, procedure, site, and patient consented.   Anesthesia Topical anesthesia was used. Anesthetic medications included Lidocaine 2%, Proparacaine 0.5%.  Procedure Preparation included 5% betadine to ocular surface, eyelid speculum. A supplied needle was used.   Injection: 1.25 mg Bevacizumab 1.25mg /0.31ml   Route: Intravitreal, Site: Left Eye   NDC: [id number], Lot: [id number], Expiration date: 09/22/2023   Post-op Post injection exam found visual acuity of at least counting fingers. The patient tolerated the procedure well. There were no complications. The patient received written and verbal post procedure care  education.           ASSESSMENT/PLAN:   ICD-10-CM   1. Exudative age-related macular degeneration of both eyes with active choroidal neovascularization (HCC)  H35.3231 OCT, Retina - OU - Both Eyes    Intravitreal Injection, Pharmacologic Agent - OS - Left Eye    Bevacizumab (AVASTIN) SOLN 1.25 mg    2. Combined forms of age-related cataract of both eyes  H25.813     3. Essential hypertension  I10     4. Hypertensive retinopathy of both eyes  H35.033       Exudative age related macular degeneration, both eyes - OD -- inactive with disciform scar - OS w/ central edema and shallow SRF - s/p IVA OS #1 (09.03.24), #2 (10.01.24) - BCVA OD 20/200; OS 20/40 -- stable OU  - OCT shows OD: Central SRHM -- large central disciform scar; no fluid; OS: Central CNV with interval improvement in edema and shallow SRF  **discussed decreased efficacy / resistance to Avastin and potential benefit of switching medication**   - recommend IVA OS #3 today, 10.29.24, but will check for [facility] authorization  - pt wishes to proceed with injection  - RBA of procedure discussed, questions answered - IVA informed consent obtained and signed, 09.03.24 (OS) - see procedure note  - f/u in 4 wks, DFE, OCT  2,3. Hypertensive retinopathy OU - discussed importance of tight BP control - monitor   4. Mixed Cataract OU - The symptoms of cataract, surgical options, and treatments and risks were discussed with patient. - discussed diagnosis and progression - monitor  Ophthalmic Meds Ordered this visit:  Meds ordered this encounter  Medications   Bevacizumab (AVASTIN) SOLN 1.25 mg     Return in about 4 weeks (around 09/09/2023) for f/u exu ARMD OU, DFE, OCT.  There are no Patient Instructions on file for this visit.  Explained the diagnoses, plan, and follow up with the patient and they expressed understanding.  Patient expressed understanding of the importance of proper follow up care.   This document  serves as a record of services personally performed by Karie Chimera, MD, PhD. It was created on their behalf by Laurey Morale, COT an ophthalmic technician. The creation of this record is the provider's dictation and/or activities during the visit.    Electronically signed by:  Charlette Caffey, COT  08/12/23 9:28 AM  This document serves as a record of services personally performed by Karie Chimera, MD, PhD. It was created on their behalf by Glee Arvin. Manson Passey, OA an ophthalmic technician. The creation of this record is the provider's dictation and/or activities during the visit.    Electronically signed by: Glee Arvin. Manson Passey, OA 08/12/23 9:28 AM   Karie Chimera, M.D., Ph.D. Diseases & Surgery of the Retina and Vitreous [facility] 08/12/2023  I have reviewed the above documentation for accuracy and completeness, and I agree with the above. Karie Chimera, M.D., Ph.D. 08/12/23 9:29 AM   Abbreviations: M myopia (nearsighted); A astigmatism; H hyperopia (farsighted); P presbyopia; Mrx  spectacle prescription;  CTL contact lenses; OD right eye; OS left eye; OU both eyes  XT exotropia; ET esotropia; PEK punctate epithelial keratitis; PEE punctate epithelial erosions; DES dry eye syndrome; MGD meibomian gland dysfunction; ATs artificial tears; PFAT's preservative free artificial tears; NSC nuclear sclerotic cataract; PSC posterior subcapsular cataract; ERM epi-retinal membrane; PVD posterior vitreous detachment; RD retinal detachment; DM diabetes mellitus; DR diabetic retinopathy; NPDR non-proliferative diabetic retinopathy; PDR proliferative diabetic retinopathy; CSME clinically significant macular edema; DME diabetic macular edema; dbh dot blot hemorrhages; CWS cotton wool spot; POAG primary open angle glaucoma; C/D cup-to-disc ratio; HVF humphrey visual field; GVF goldmann visual field; OCT optical coherence tomography; IOP intraocular pressure; BRVO Branch retinal  vein occlusion; CRVO central retinal vein occlusion; CRAO central retinal artery occlusion; BRAO branch retinal artery occlusion; RT retinal tear; SB scleral buckle; PPV pars plana vitrectomy; VH Vitreous hemorrhage; PRP panretinal laser photocoagulation; IVK intravitreal kenalog; VMT vitreomacular traction; MH Macular hole;  NVD neovascularization of the disc; NVE neovascularization elsewhere; AREDS age related eye disease study; ARMD age related macular degeneration; POAG primary open angle glaucoma; EBMD epithelial/anterior basement membrane dystrophy; ACIOL anterior chamber intraocular lens; IOL intraocular lens; PCIOL posterior chamber intraocular lens; Phaco/IOL phacoemulsification with intraocular lens placement; PRK photorefractive keratectomy; LASIK laser assisted in situ keratomileusis; HTN hypertension; DM diabetes mellitus; COPD chronic obstructive pulmonary disease

## 2023-08-01 DIAGNOSIS — F1120 Opioid dependence, uncomplicated: Secondary | ICD-10-CM | POA: Diagnosis not present

## 2023-08-08 DIAGNOSIS — F1120 Opioid dependence, uncomplicated: Secondary | ICD-10-CM | POA: Diagnosis not present

## 2023-08-12 ENCOUNTER — Encounter (INDEPENDENT_AMBULATORY_CARE_PROVIDER_SITE_OTHER): Payer: Self-pay | Admitting: Ophthalmology

## 2023-08-12 ENCOUNTER — Ambulatory Visit (INDEPENDENT_AMBULATORY_CARE_PROVIDER_SITE_OTHER): Payer: Medicare HMO | Admitting: Ophthalmology

## 2023-08-12 DIAGNOSIS — H25813 Combined forms of age-related cataract, bilateral: Secondary | ICD-10-CM

## 2023-08-12 DIAGNOSIS — H35033 Hypertensive retinopathy, bilateral: Secondary | ICD-10-CM | POA: Diagnosis not present

## 2023-08-12 DIAGNOSIS — I10 Essential (primary) hypertension: Secondary | ICD-10-CM

## 2023-08-12 DIAGNOSIS — H353231 Exudative age-related macular degeneration, bilateral, with active choroidal neovascularization: Secondary | ICD-10-CM | POA: Diagnosis not present

## 2023-08-12 MED ORDER — BEVACIZUMAB CHEMO INJECTION 1.25MG/0.05ML SYRINGE FOR KALEIDOSCOPE
1.2500 mg | INTRAVITREAL | Status: AC | PRN
  Administered 2023-08-12: 1.25 mg via INTRAVITREAL

## 2023-08-15 DIAGNOSIS — F1120 Opioid dependence, uncomplicated: Secondary | ICD-10-CM | POA: Diagnosis not present

## 2023-08-22 DIAGNOSIS — F1120 Opioid dependence, uncomplicated: Secondary | ICD-10-CM | POA: Diagnosis not present

## 2023-08-29 DIAGNOSIS — F1120 Opioid dependence, uncomplicated: Secondary | ICD-10-CM | POA: Diagnosis not present

## 2023-09-05 DIAGNOSIS — F1120 Opioid dependence, uncomplicated: Secondary | ICD-10-CM | POA: Diagnosis not present

## 2023-09-09 ENCOUNTER — Encounter (INDEPENDENT_AMBULATORY_CARE_PROVIDER_SITE_OTHER): Payer: Medicare HMO | Admitting: Ophthalmology

## 2023-09-09 DIAGNOSIS — I10 Essential (primary) hypertension: Secondary | ICD-10-CM

## 2023-09-09 DIAGNOSIS — H353231 Exudative age-related macular degeneration, bilateral, with active choroidal neovascularization: Secondary | ICD-10-CM

## 2023-09-09 DIAGNOSIS — H25813 Combined forms of age-related cataract, bilateral: Secondary | ICD-10-CM

## 2023-09-09 DIAGNOSIS — H35033 Hypertensive retinopathy, bilateral: Secondary | ICD-10-CM

## 2023-09-09 NOTE — Progress Notes (Signed)
[facility] - Clinic Note  09/15/2023   CHIEF COMPLAINT Patient presents for Retina Follow Up  HISTORY OF PRESENT ILLNESS: [name] is a 67 y.o. female who presents to the clinic today for:  HPI     Retina Follow Up   Patient presents with  Wet AMD.  In both eyes.  This started months ago.  Duration of 5 weeks.  Since onset it is stable.  I, the attending physician,  performed the HPI with the patient and updated documentation appropriately.        Comments   Patient states that at time she can see better without her glasses. She is not using eye drops.       Last edited by Rennis Chris, MD on 09/15/2023  1:14 PM.    Pt states she hasn't paid much attention to whether her vision has improved or not -- Mother is in hospital on comfort care  Referring physician: Orpah Cobb, MD [address] [place],  [place] [address]  HISTORICAL INFORMATION:  Selected notes from the MEDICAL RECORD NUMBER Referred by Dr. Bascom Levels for concern of ARMD  LEE:  Ocular Hx- PMH-   CURRENT MEDICATIONS: No current outpatient medications on file. (Ophthalmic Drugs)   No current facility-administered medications for this visit. (Ophthalmic Drugs)   Current Outpatient Medications (Other)  Medication Sig   albuterol (VENTOLIN HFA) 108 (90 Base) MCG/ACT inhaler Inhale 2 puffs into the lungs every 6 (six) hours as needed for wheezing or shortness of breath.   amLODipine (NORVASC) 2.5 MG tablet Take 1 tablet (2.5 mg total) by mouth daily.   aspirin EC 81 MG tablet Take 1 tablet (81 mg total) by mouth daily. Swallow whole.   atorvastatin (LIPITOR) 40 MG tablet Take 1 tablet (40 mg total) by mouth daily at 6 PM.   cyclobenzaprine (FLEXERIL) 10 MG tablet Take 1 tablet (10 mg total) by mouth 3 (three) times daily as needed for muscle spasms.   escitalopram (LEXAPRO) 10 MG tablet Take 1 tablet (10 mg total) by mouth daily.   HYDROcodone-acetaminophen (NORCO) 7.5-325 MG tablet  Take 1 tablet by mouth every 12 (twelve) hours as needed for moderate pain.   methocarbamol (ROBAXIN) 500 MG tablet Take 1 tablet (500 mg total) by mouth every 6 (six) hours as needed for muscle spasms.   metoprolol tartrate (LOPRESSOR) 25 MG tablet Take 0.5 tablets (12.5 mg total) by mouth 2 (two) times daily.   oxyCODONE-acetaminophen (PERCOCET/ROXICET) 5-325 MG tablet Take 1 tablet by mouth every 8 (eight) hours as needed for severe pain.   potassium chloride (KLOR-CON M) 10 MEQ tablet Take 1 tablet (10 mEq total) by mouth daily.   No current facility-administered medications for this visit. (Other)   REVIEW OF SYSTEMS: ROS   Positive for: Cardiovascular, Eyes Negative for: Constitutional, Gastrointestinal, Neurological, Skin, Genitourinary, Musculoskeletal, HENT, Endocrine, Respiratory, Psychiatric, Allergic/Imm, Heme/Lymph Last edited by Charlette Caffey, COT on 09/15/2023  7:54 AM.     ALLERGIES Allergies  Allergen Reactions   Acetaminophen-Codeine Other (See Comments)    Hand get red, feels like it's on fire   Morphine And Codeine     "makes me feel like i'm on fire"   Nitroglycerin     Causes a headache   Tramadol Hcl     Gives her the shakes   Azithromycin Itching and Rash   PAST MEDICAL HISTORY Past Medical History:  Diagnosis Date   Acute hepatitis C without mention of hepatic  coma(070.51)    Anemia    Arthritis    back   Carpal tunnel syndrome    Chest pain, unspecified    Chronic airway obstruction, not elsewhere classified    Chronic hepatitis C without hepatic coma (HCC) 02/09/2018   Colitis    Coronary atherosclerosis of unspecified type of vessel, native or graft    Disorders of bursae and tendons in shoulder region, unspecified    Drug-induced skin rash 04/07/2018   GERD (gastroesophageal reflux disease)    Hardware complicating wound infection (HCC) 02/09/2018   Headache    migraines   Hepatitis    hx of hepatitis C   Infective otitis externa,  unspecified    Mixed hyperlipidemia    MRSA infection 02/09/2018   Nonspecific abnormal results of thyroid function study    Other bursitis disorders    Other symptoms involving nervous and musculoskeletal systems(781.99)    Pneumonia    Seizures (HCC)    none for 4 years (as of 01/2018) "pseudo seizures"   Thrush 04/07/2018   Past Surgical History:  Procedure Laterality Date   ABDOMINAL HYSTERECTOMY     CARDIAC CATHETERIZATION     CARDIAC CATHETERIZATION N/A 06/25/2016   Procedure: Left Heart Cath and Coronary Angiography;  Surgeon: Orpah Cobb, MD;  Location: [facility];  Service: Cardiovascular;  Laterality: N/A;   CESAREAN SECTION     CHOLECYSTECTOMY     COLONOSCOPY     IR FLUORO GUIDED NEEDLE PLC ASPIRATION/INJECTION LOC  12/27/2017   LEFT HEART CATH AND CORONARY ANGIOGRAPHY N/A 06/06/2022   Procedure: LEFT HEART CATH AND CORONARY ANGIOGRAPHY;  Surgeon: Orpah Cobb, MD;  Location: [facility];  Service: Cardiovascular;  Laterality: N/A;   LUMBAR FUSION  01/13/2018   Transforaminal lumbar interbody fusion L4-5 with screws, cages and rods, local bone graft, Allograft, Vivigen, Decompression L4-5 and L5-S1   TONSILLECTOMY     FAMILY HISTORY Family History  Problem Relation Age of Onset   Colonic polyp Mother    Thyroid disease Mother    COPD Mother    Macular degeneration Mother    Uterine cancer Maternal Grandmother    Crohn's disease Daughter 45       colon resection   Irritable bowel syndrome Daughter    Heart murmur Daughter        SVT   Colon cancer Neg Hx    SOCIAL HISTORY Social History   Tobacco Use   Smoking status: Former    Current packs/day: 0.00    Types: Cigarettes    Start date: 06/16/1969    Quit date: 06/16/2022    Years since quitting: 1.2   Smokeless tobacco: Never   Tobacco comments:    2 cigarettes a day  Vaping Use   Vaping status: Former   Quit date: 02/26/2022  Substance Use Topics   Alcohol use: No   Drug use: No        OPHTHALMIC EXAM:  Base Eye Exam     Visual Acuity (Snellen - Linear)       Right Left   Dist cc 20/200 20/40 +2   Dist ph cc NI NI    Correction: Glasses         Tonometry (Tonopen, 7:57 AM)       Right Left   Pressure 21 17         Pupils       Dark Light Shape React APD   Right 3 2 Round Brisk None  Left 3 2 Round Brisk None         Visual Fields       Left Right    Full Full         Extraocular Movement       Right Left    Full, Ortho Full, Ortho         Neuro/Psych     Oriented x3: Yes   Mood/Affect: Normal         Dilation     Both eyes: 1.0% Mydriacyl, 2.5% Phenylephrine @ 7:55 AM           Slit Lamp and Fundus Exam     Slit Lamp Exam       Right Left   Lids/Lashes Dermatochalasis - upper lid Dermatochalasis - upper lid   Conjunctiva/Sclera nasal and temporal pinguecula nasal and temporal pinguecula   Cornea 2+ Punctate epithelial erosions, tear film debris 2+ Punctate epithelial erosions, tear film debris   Anterior Chamber deep and clear deep and clear   Iris Round and dilated Round and dilated   Lens 2+ Nuclear sclerosis, 2+ Cortical cataract 2+ Nuclear sclerosis, 2+ Cortical cataract   Anterior Vitreous mild syneresis mild syneresis, Posterior vitreous detachment         Fundus Exam       Right Left   Disc Pink and Sharp, +cupping Pink and Sharp, PPA   C/D Ratio 0.6 0.6   Macula Blunted foveal reflex, central subretinal fibrosis / scar with surrounding atrophy, +pigment clumping, no heme or edema Blunted foveal reflex, central CNV with pigment ring, edema and shallow SRF -- persistent, no frank heme, RPE mottling, clumping and atrophy   Vessels attenuated attenuated   Periphery Attached, No heme Attached, No heme           Refraction     Wearing Rx       Sphere Cylinder Axis   Right -2.75 +1.50 115   Left -2.75 +1.50 118           IMAGING AND PROCEDURES  Imaging and Procedures for  09/15/2023  OCT, Retina - OU - Both Eyes       Right Eye Quality was good. Central Foveal Thickness: 277. Progression has been stable. Findings include no IRF, no SRF, abnormal foveal contour, retinal drusen , outer retinal atrophy [facility] -- large central disciform scar; no fluid).   Left Eye Quality was good. Central Foveal Thickness: 403. Progression has been stable. Findings include no IRF, abnormal foveal contour, retinal drusen , subretinal hyper-reflective material, choroidal neovascular membrane, subretinal fluid (Central CNV with persistent edema and shallow SRF ).   Notes *Images captured and stored on drive  Diagnosis / Impression:  Exudative ARMD OU OD: Central SRHM -- large central disciform scar; no fluid OS: Central CNV with persistent shallow SRF   Clinical management:  See below  Abbreviations: NFP - Normal foveal profile. CME - cystoid macular edema. PED - pigment epithelial detachment. IRF - intraretinal fluid. SRF - subretinal fluid. EZ - ellipsoid zone. ERM - epiretinal membrane. ORA - outer retinal atrophy. ORT - outer retinal tubulation. SRHM - subretinal hyper-reflective material. IRHM - intraretinal hyper-reflective material      Intravitreal Injection, Pharmacologic Agent - OS - Left Eye       Time Out 09/15/2023. 8:35 AM. Confirmed correct patient, procedure, site, and patient consented.   Anesthesia Topical anesthesia was used. Anesthetic medications included Lidocaine 2%, Proparacaine 0.5%.   Procedure Preparation included 5% betadine  to ocular surface, eyelid speculum. A (32g) needle was used.   Injection: 2 mg aflibercept 2 MG/0.05ML   Route: Intravitreal, Site: Left Eye   NDC: [id number], Lot: [id number], Expiration date: 02/09/2025, Waste: 0 mL   Post-op Post injection exam found visual acuity of at least counting fingers. The patient tolerated the procedure well. There were no complications. The patient received written and verbal  post procedure care education.           ASSESSMENT/PLAN:   ICD-10-CM   1. Exudative age-related macular degeneration of both eyes with active choroidal neovascularization (HCC)  H35.3231 OCT, Retina - OU - Both Eyes    Intravitreal Injection, Pharmacologic Agent - OS - Left Eye    aflibercept (EYLEA) SOLN 2 mg    2. Combined forms of age-related cataract of both eyes  H25.813     3. Essential hypertension  I10     4. Hypertensive retinopathy of both eyes  H35.033      Exudative age related macular degeneration, both eyes - OD -- inactive with disciform scar - OS w/ central edema and shallow SRF - s/p IVA OS #1 (09.03.24), #2 (10.01.24), #3 (10.29.24) - BCVA OD 20/200; OS 20/40 -- stable OU  - OCT shows OD: Central SRHM -- large central disciform scar; no fluid; OS: Central CNV with persistent shallow SRF at 4 wks **discussed decreased efficacy / resistance to Avastin and potential benefit of switching medication**  - recommend switching to IVE OS #1 today, 12.02.24  - pt wishes to proceed with injection  - RBA of procedure discussed, questions answered - IVE informed consent obtained and signed, 12.02.24 (OS) - see procedure note  - f/u in 4 wks, DFE, OCT  2,3. Hypertensive retinopathy OU - discussed importance of tight BP control - monitor   4. Mixed Cataract OU - The symptoms of cataract, surgical options, and treatments and risks were discussed with patient. - discussed diagnosis and progression - monitor  Ophthalmic Meds Ordered this visit:  Meds ordered this encounter  Medications   aflibercept (EYLEA) SOLN 2 mg     Return in about 4 weeks (around 10/13/2023) for f/u exu ARMD OU, DFE, OCT, Possible Injxn.  There are no Patient Instructions on file for this visit.  Explained the diagnoses, plan, and follow up with the patient and they expressed understanding.  Patient expressed understanding of the importance of proper follow up care.   This document serves  as a record of services personally performed by Karie Chimera, MD, PhD. It was created on their behalf by Glee Arvin. Manson Passey, OA an ophthalmic technician. The creation of this record is the provider's dictation and/or activities during the visit.    Electronically signed by: Glee Arvin. Manson Passey, OA 09/15/23 11:41 PM  Karie Chimera, M.D., Ph.D. Diseases & Surgery of the Retina and Vitreous [facility] 09/15/2023  I have reviewed the above documentation for accuracy and completeness, and I agree with the above. Karie Chimera, M.D., Ph.D. 09/15/23 11:44 PM   Abbreviations: M myopia (nearsighted); A astigmatism; H hyperopia (farsighted); P presbyopia; Mrx spectacle prescription;  CTL contact lenses; OD right eye; OS left eye; OU both eyes  XT exotropia; ET esotropia; PEK punctate epithelial keratitis; PEE punctate epithelial erosions; DES dry eye syndrome; MGD meibomian gland dysfunction; ATs artificial tears; PFAT's preservative free artificial tears; NSC nuclear sclerotic cataract; PSC posterior subcapsular cataract; ERM epi-retinal membrane; PVD posterior vitreous detachment; RD retinal detachment; DM diabetes mellitus;  DR diabetic retinopathy; NPDR non-proliferative diabetic retinopathy; PDR proliferative diabetic retinopathy; CSME clinically significant macular edema; DME diabetic macular edema; dbh dot blot hemorrhages; CWS cotton wool spot; POAG primary open angle glaucoma; C/D cup-to-disc ratio; HVF humphrey visual field; GVF goldmann visual field; OCT optical coherence tomography; IOP intraocular pressure; BRVO Branch retinal vein occlusion; CRVO central retinal vein occlusion; CRAO central retinal artery occlusion; BRAO branch retinal artery occlusion; RT retinal tear; SB scleral buckle; PPV pars plana vitrectomy; VH Vitreous hemorrhage; PRP panretinal laser photocoagulation; IVK intravitreal kenalog; VMT vitreomacular traction; MH Macular hole;  NVD neovascularization of the  disc; NVE neovascularization elsewhere; AREDS age related eye disease study; ARMD age related macular degeneration; POAG primary open angle glaucoma; EBMD epithelial/anterior basement membrane dystrophy; ACIOL anterior chamber intraocular lens; IOL intraocular lens; PCIOL posterior chamber intraocular lens; Phaco/IOL phacoemulsification with intraocular lens placement; PRK photorefractive keratectomy; LASIK laser assisted in situ keratomileusis; HTN hypertension; DM diabetes mellitus; COPD chronic obstructive pulmonary disease

## 2023-09-12 DIAGNOSIS — F1120 Opioid dependence, uncomplicated: Secondary | ICD-10-CM | POA: Diagnosis not present

## 2023-09-15 ENCOUNTER — Ambulatory Visit (INDEPENDENT_AMBULATORY_CARE_PROVIDER_SITE_OTHER): Payer: Medicare HMO | Admitting: Ophthalmology

## 2023-09-15 ENCOUNTER — Encounter (INDEPENDENT_AMBULATORY_CARE_PROVIDER_SITE_OTHER): Payer: Self-pay | Admitting: Ophthalmology

## 2023-09-15 DIAGNOSIS — H25813 Combined forms of age-related cataract, bilateral: Secondary | ICD-10-CM

## 2023-09-15 DIAGNOSIS — I10 Essential (primary) hypertension: Secondary | ICD-10-CM | POA: Diagnosis not present

## 2023-09-15 DIAGNOSIS — H35033 Hypertensive retinopathy, bilateral: Secondary | ICD-10-CM

## 2023-09-15 DIAGNOSIS — H353231 Exudative age-related macular degeneration, bilateral, with active choroidal neovascularization: Secondary | ICD-10-CM | POA: Diagnosis not present

## 2023-09-15 MED ORDER — AFLIBERCEPT 2MG/0.05ML IZ SOLN FOR KALEIDOSCOPE
2.0000 mg | INTRAVITREAL | Status: AC | PRN
  Administered 2023-09-15: 2 mg via INTRAVITREAL

## 2023-09-19 DIAGNOSIS — F1120 Opioid dependence, uncomplicated: Secondary | ICD-10-CM | POA: Diagnosis not present

## 2023-09-26 DIAGNOSIS — F1120 Opioid dependence, uncomplicated: Secondary | ICD-10-CM | POA: Diagnosis not present

## 2023-09-30 NOTE — Progress Notes (Signed)
[facility] - Clinic Note  10/13/2023   CHIEF COMPLAINT Patient presents for Retina Follow Up  HISTORY OF PRESENT ILLNESS: [name] is a 67 y.o. female who presents to the clinic today for:  HPI     Retina Follow Up   Patient presents with  Wet AMD.  In both eyes.  This started months ago.  Duration of 4 weeks.  Since onset it is stable.  I, the attending physician,  performed the HPI with the patient and updated documentation appropriately.        Comments   Patient states that she does not see any vision improvements. The eye is hurting and she is seeing floaters. She is not using eye drops.       Last edited by Rennis Chris, MD on 10/13/2023 12:03 PM.     Pt states   Referring physician: Orpah Cobb, MD [address] [place],  [place] [address]  HISTORICAL INFORMATION:  Selected notes from the MEDICAL RECORD NUMBER Referred by Dr. Bascom Levels for concern of ARMD  LEE:  Ocular Hx- PMH-   CURRENT MEDICATIONS: No current outpatient medications on file. (Ophthalmic Drugs)   No current facility-administered medications for this visit. (Ophthalmic Drugs)   Current Outpatient Medications (Other)  Medication Sig   albuterol (VENTOLIN HFA) 108 (90 Base) MCG/ACT inhaler Inhale 2 puffs into the lungs every 6 (six) hours as needed for wheezing or shortness of breath.   amLODipine (NORVASC) 2.5 MG tablet Take 1 tablet (2.5 mg total) by mouth daily.   aspirin EC 81 MG tablet Take 1 tablet (81 mg total) by mouth daily. Swallow whole.   atorvastatin (LIPITOR) 40 MG tablet Take 1 tablet (40 mg total) by mouth daily at 6 PM.   cyclobenzaprine (FLEXERIL) 10 MG tablet Take 1 tablet (10 mg total) by mouth 3 (three) times daily as needed for muscle spasms.   HYDROcodone-acetaminophen (NORCO) 7.5-325 MG tablet Take 1 tablet by mouth every 12 (twelve) hours as needed for moderate pain.   methocarbamol (ROBAXIN) 500 MG tablet Take 1 tablet (500 mg total) by mouth  every 6 (six) hours as needed for muscle spasms.   metoprolol tartrate (LOPRESSOR) 25 MG tablet Take 0.5 tablets (12.5 mg total) by mouth 2 (two) times daily.   oxyCODONE-acetaminophen (PERCOCET/ROXICET) 5-325 MG tablet Take 1 tablet by mouth every 8 (eight) hours as needed for severe pain.   potassium chloride (KLOR-CON M) 10 MEQ tablet Take 1 tablet (10 mEq total) by mouth daily.   escitalopram (LEXAPRO) 10 MG tablet Take 1 tablet (10 mg total) by mouth daily.   No current facility-administered medications for this visit. (Other)   REVIEW OF SYSTEMS: ROS   Positive for: Cardiovascular, Eyes Negative for: Constitutional, Gastrointestinal, Neurological, Skin, Genitourinary, Musculoskeletal, HENT, Endocrine, Respiratory, Psychiatric, Allergic/Imm, Heme/Lymph Last edited by Charlette Caffey, COT on 10/13/2023  7:36 AM.      ALLERGIES Allergies  Allergen Reactions   Acetaminophen-Codeine Other (See Comments)    Hand get red, feels like it's on fire   Morphine And Codeine     "makes me feel like i'm on fire"   Nitroglycerin     Causes a headache   Tramadol Hcl     Gives her the shakes   Azithromycin Itching and Rash   PAST MEDICAL HISTORY Past Medical History:  Diagnosis Date   Acute hepatitis C without mention of hepatic coma(070.51)    Anemia    Arthritis  back   Carpal tunnel syndrome    Chest pain, unspecified    Chronic airway obstruction, not elsewhere classified    Chronic hepatitis C without hepatic coma (HCC) 02/09/2018   Colitis    Coronary atherosclerosis of unspecified type of vessel, native or graft    Disorders of bursae and tendons in shoulder region, unspecified    Drug-induced skin rash 04/07/2018   GERD (gastroesophageal reflux disease)    Hardware complicating wound infection (HCC) 02/09/2018   Headache    migraines   Hepatitis    hx of hepatitis C   Infective otitis externa, unspecified    Mixed hyperlipidemia    MRSA infection 02/09/2018    Nonspecific abnormal results of thyroid function study    Other bursitis disorders    Other symptoms involving nervous and musculoskeletal systems(781.99)    Pneumonia    Seizures (HCC)    none for 4 years (as of 01/2018) "pseudo seizures"   Thrush 04/07/2018   Past Surgical History:  Procedure Laterality Date   ABDOMINAL HYSTERECTOMY     CARDIAC CATHETERIZATION     CARDIAC CATHETERIZATION N/A 06/25/2016   Procedure: Left Heart Cath and Coronary Angiography;  Surgeon: Orpah Cobb, MD;  Location: [facility];  Service: Cardiovascular;  Laterality: N/A;   CESAREAN SECTION     CHOLECYSTECTOMY     COLONOSCOPY     IR FLUORO GUIDED NEEDLE PLC ASPIRATION/INJECTION LOC  12/27/2017   LEFT HEART CATH AND CORONARY ANGIOGRAPHY N/A 06/06/2022   Procedure: LEFT HEART CATH AND CORONARY ANGIOGRAPHY;  Surgeon: Orpah Cobb, MD;  Location: [facility];  Service: Cardiovascular;  Laterality: N/A;   LUMBAR FUSION  01/13/2018   Transforaminal lumbar interbody fusion L4-5 with screws, cages and rods, local bone graft, Allograft, Vivigen, Decompression L4-5 and L5-S1   TONSILLECTOMY     FAMILY HISTORY Family History  Problem Relation Age of Onset   Colonic polyp Mother    Thyroid disease Mother    COPD Mother    Macular degeneration Mother    Uterine cancer Maternal Grandmother    Crohn's disease Daughter 61       colon resection   Irritable bowel syndrome Daughter    Heart murmur Daughter        SVT   Colon cancer Neg Hx    SOCIAL HISTORY Social History   Tobacco Use   Smoking status: Former    Current packs/day: 0.00    Types: Cigarettes    Start date: 06/16/1969    Quit date: 06/16/2022    Years since quitting: 1.3   Smokeless tobacco: Never   Tobacco comments:    2 cigarettes a day  Vaping Use   Vaping status: Former   Quit date: 02/26/2022  Substance Use Topics   Alcohol use: No   Drug use: No       OPHTHALMIC EXAM:  Base Eye Exam     Visual Acuity (Snellen -  Linear)       Right Left   Dist cc 20/150 20/40   Dist ph cc NI NI    Correction: Glasses         Tonometry (Tonopen, 7:39 AM)       Right Left   Pressure 19 20         Pupils       Dark Light Shape React APD   Right 3 2 Round Brisk None   Left 3 2 Round Brisk None  Visual Fields       Left Right    Full Full         Extraocular Movement       Right Left    Full, Ortho Full, Ortho         Neuro/Psych     Oriented x3: Yes   Mood/Affect: Normal         Dilation     Both eyes: 1.0% Mydriacyl, 2.5% Phenylephrine @ 7:37 AM           Slit Lamp and Fundus Exam     Slit Lamp Exam       Right Left   Lids/Lashes Dermatochalasis - upper lid Dermatochalasis - upper lid   Conjunctiva/Sclera nasal and temporal pinguecula nasal and temporal pinguecula   Cornea 2+ Punctate epithelial erosions, tear film debris 2+ Punctate epithelial erosions, tear film debris   Anterior Chamber deep and clear deep and clear   Iris Round and dilated Round and dilated   Lens 2+ Nuclear sclerosis, 2+ Cortical cataract 2+ Nuclear sclerosis, 2+ Cortical cataract   Anterior Vitreous mild syneresis mild syneresis, Posterior vitreous detachment         Fundus Exam       Right Left   Disc Pink and Sharp, +cupping Pink and Sharp, PPA   C/D Ratio 0.6 0.6   Macula Blunted foveal reflex, central subretinal fibrosis / scar with surrounding atrophy, +pigment clumping, no heme or edema Blunted foveal reflex, central CNV with pigment ring and edema, shallow SRF -- improved, no frank heme, RPE mottling, clumping and atrophy   Vessels attenuated, mild tortuosity attenuated   Periphery Attached, No heme Attached, No heme           Refraction     Wearing Rx       Sphere Cylinder Axis   Right -2.75 +1.50 115   Left -2.75 +1.50 118           IMAGING AND PROCEDURES  Imaging and Procedures for 10/13/2023  OCT, Retina - OU - Both Eyes       Right Eye Quality  was good. Central Foveal Thickness: 337. Progression has been stable. Findings include no IRF, no SRF, abnormal foveal contour, retinal drusen , outer retinal atrophy [facility] -- large central disciform scar; no fluid).   Left Eye Quality was good. Central Foveal Thickness: 341. Progression has improved. Findings include no IRF, no SRF, abnormal foveal contour, retinal drusen , subretinal hyper-reflective material, choroidal neovascular membrane (Central CNV with interval improvement in SRF and edema; persistent SRHM).   Notes *Images captured and stored on drive  Diagnosis / Impression:  Exudative ARMD OU OD: Central SRHM -- large central disciform scar; no fluid OS: Central CNV with interval improvement in SRF and edema; persistent SRHM  Clinical management:  See below  Abbreviations: NFP - Normal foveal profile. CME - cystoid macular edema. PED - pigment epithelial detachment. IRF - intraretinal fluid. SRF - subretinal fluid. EZ - ellipsoid zone. ERM - epiretinal membrane. ORA - outer retinal atrophy. ORT - outer retinal tubulation. SRHM - subretinal hyper-reflective material. IRHM - intraretinal hyper-reflective material      Intravitreal Injection, Pharmacologic Agent - OS - Left Eye       Time Out 10/13/2023. 8:26 AM. Confirmed correct patient, procedure, site, and patient consented.   Anesthesia Topical anesthesia was used. Anesthetic medications included Lidocaine 2%, Proparacaine 0.5%.   Procedure Preparation included 5% betadine to ocular surface, eyelid speculum. A (32g)  needle was used.   Injection: 2 mg aflibercept 2 MG/0.05ML   Route: Intravitreal, Site: Left Eye   NDC: [id number], Lot: [id number], Expiration date: 02/09/2025, Waste: 0 mL   Post-op Post injection exam found visual acuity of at least counting fingers. The patient tolerated the procedure well. There were no complications. The patient received written and verbal post procedure care education.            ASSESSMENT/PLAN:   ICD-10-CM   1. Exudative age-related macular degeneration of both eyes with active choroidal neovascularization (HCC)  H35.3231 OCT, Retina - OU - Both Eyes    Intravitreal Injection, Pharmacologic Agent - OS - Left Eye    aflibercept (EYLEA) SOLN 2 mg    2. Combined forms of age-related cataract of both eyes  H25.813     3. Essential hypertension  I10     4. Hypertensive retinopathy of both eyes  H35.033      Exudative age related macular degeneration, both eyes - OD -- inactive with disciform scar - OS w/ central edema and shallow SRF - s/p IVA OS #1 (09.03.24), #2 (10.01.24), #3 (10.29.24) -- IVA resistance - s/p IVE OS #1 (12.02.24) - BCVA OD 20/150; OS 20/40 -- stable OU  - OCT shows OD: Central SRHM -- large central disciform scar; no fluid; OS: central CNV with interval improvement in SRF and edema; persistent SRHM at 4 wks  - recommend IVE OS #2 today, 12.30.24 w/ f/u in 4 wks  - pt wishes to proceed with injection  - RBA of procedure discussed, questions answered - IVE informed consent obtained and signed, 12.02.24 (OS) - see procedure note  - f/u in 4 wks, DFE, OCT  2,3. Hypertensive retinopathy OU - discussed importance of tight BP control - monitor   4. Mixed Cataract OU - The symptoms of cataract, surgical options, and treatments and risks were discussed with patient. - discussed diagnosis and progression - monitor  Ophthalmic Meds Ordered this visit:  Meds ordered this encounter  Medications   aflibercept (EYLEA) SOLN 2 mg     Return in about 4 weeks (around 11/10/2023) for f/u exu ARMD OU, DFE, OCT.  There are no Patient Instructions on file for this visit.  Explained the diagnoses, plan, and follow up with the patient and they expressed understanding.  Patient expressed understanding of the importance of proper follow up care.   This document serves as a record of services personally performed by Karie Chimera, MD,  PhD. It was created on their behalf by Glee Arvin. Manson Passey, OA an ophthalmic technician. The creation of this record is the provider's dictation and/or activities during the visit.    Electronically signed by: Glee Arvin. Manson Passey, OA 10/13/23 12:03 PM  Karie Chimera, M.D., Ph.D. Diseases & Surgery of the Retina and Vitreous [facility] 10/13/2023  I have reviewed the above documentation for accuracy and completeness, and I agree with the above. Karie Chimera, M.D., Ph.D. 10/13/23 12:04 PM   Abbreviations: M myopia (nearsighted); A astigmatism; H hyperopia (farsighted); P presbyopia; Mrx spectacle prescription;  CTL contact lenses; OD right eye; OS left eye; OU both eyes  XT exotropia; ET esotropia; PEK punctate epithelial keratitis; PEE punctate epithelial erosions; DES dry eye syndrome; MGD meibomian gland dysfunction; ATs artificial tears; PFAT's preservative free artificial tears; NSC nuclear sclerotic cataract; PSC posterior subcapsular cataract; ERM epi-retinal membrane; PVD posterior vitreous detachment; RD retinal detachment; DM diabetes mellitus; DR diabetic retinopathy; NPDR non-proliferative  diabetic retinopathy; PDR proliferative diabetic retinopathy; CSME clinically significant macular edema; DME diabetic macular edema; dbh dot blot hemorrhages; CWS cotton wool spot; POAG primary open angle glaucoma; C/D cup-to-disc ratio; HVF humphrey visual field; GVF goldmann visual field; OCT optical coherence tomography; IOP intraocular pressure; BRVO Branch retinal vein occlusion; CRVO central retinal vein occlusion; CRAO central retinal artery occlusion; BRAO branch retinal artery occlusion; RT retinal tear; SB scleral buckle; PPV pars plana vitrectomy; VH Vitreous hemorrhage; PRP panretinal laser photocoagulation; IVK intravitreal kenalog; VMT vitreomacular traction; MH Macular hole;  NVD neovascularization of the disc; NVE neovascularization elsewhere; AREDS age related eye  disease study; ARMD age related macular degeneration; POAG primary open angle glaucoma; EBMD epithelial/anterior basement membrane dystrophy; ACIOL anterior chamber intraocular lens; IOL intraocular lens; PCIOL posterior chamber intraocular lens; Phaco/IOL phacoemulsification with intraocular lens placement; PRK photorefractive keratectomy; LASIK laser assisted in situ keratomileusis; HTN hypertension; DM diabetes mellitus; COPD chronic obstructive pulmonary disease

## 2023-10-03 DIAGNOSIS — F1120 Opioid dependence, uncomplicated: Secondary | ICD-10-CM | POA: Diagnosis not present

## 2023-10-10 DIAGNOSIS — F1120 Opioid dependence, uncomplicated: Secondary | ICD-10-CM | POA: Diagnosis not present

## 2023-10-13 ENCOUNTER — Ambulatory Visit (INDEPENDENT_AMBULATORY_CARE_PROVIDER_SITE_OTHER): Payer: Medicare HMO | Admitting: Ophthalmology

## 2023-10-13 ENCOUNTER — Encounter (INDEPENDENT_AMBULATORY_CARE_PROVIDER_SITE_OTHER): Payer: Self-pay | Admitting: Ophthalmology

## 2023-10-13 DIAGNOSIS — H35033 Hypertensive retinopathy, bilateral: Secondary | ICD-10-CM

## 2023-10-13 DIAGNOSIS — H25813 Combined forms of age-related cataract, bilateral: Secondary | ICD-10-CM

## 2023-10-13 DIAGNOSIS — H353231 Exudative age-related macular degeneration, bilateral, with active choroidal neovascularization: Secondary | ICD-10-CM

## 2023-10-13 DIAGNOSIS — I10 Essential (primary) hypertension: Secondary | ICD-10-CM | POA: Diagnosis not present

## 2023-10-13 MED ORDER — AFLIBERCEPT 2MG/0.05ML IZ SOLN FOR KALEIDOSCOPE
2.0000 mg | INTRAVITREAL | Status: AC | PRN
  Administered 2023-10-13: 2 mg via INTRAVITREAL

## 2023-10-17 DIAGNOSIS — F1120 Opioid dependence, uncomplicated: Secondary | ICD-10-CM | POA: Diagnosis not present

## 2023-10-17 DIAGNOSIS — H40023 Open angle with borderline findings, high risk, bilateral: Secondary | ICD-10-CM | POA: Diagnosis not present

## 2023-10-24 DIAGNOSIS — F1120 Opioid dependence, uncomplicated: Secondary | ICD-10-CM | POA: Diagnosis not present

## 2023-10-30 ENCOUNTER — Encounter: Payer: Self-pay | Admitting: Emergency Medicine

## 2023-10-30 ENCOUNTER — Emergency Department: Payer: Medicare HMO

## 2023-10-30 ENCOUNTER — Other Ambulatory Visit: Payer: Self-pay

## 2023-10-30 ENCOUNTER — Inpatient Hospital Stay
Admission: EM | Admit: 2023-10-30 | Discharge: 2023-11-03 | DRG: 871 | Disposition: A | Discharge status: Home / Self Care | Payer: Medicare HMO | Attending: Hospitalist | Admitting: Hospitalist

## 2023-10-30 DIAGNOSIS — I5032 Chronic diastolic (congestive) heart failure: Secondary | ICD-10-CM | POA: Diagnosis present

## 2023-10-30 DIAGNOSIS — B182 Chronic viral hepatitis C: Secondary | ICD-10-CM | POA: Diagnosis present

## 2023-10-30 DIAGNOSIS — R0600 Dyspnea, unspecified: Secondary | ICD-10-CM

## 2023-10-30 DIAGNOSIS — Z87891 Personal history of nicotine dependence: Secondary | ICD-10-CM

## 2023-10-30 DIAGNOSIS — R7989 Other specified abnormal findings of blood chemistry: Secondary | ICD-10-CM | POA: Diagnosis not present

## 2023-10-30 DIAGNOSIS — Z981 Arthrodesis status: Secondary | ICD-10-CM

## 2023-10-30 DIAGNOSIS — Z8049 Family history of malignant neoplasm of other genital organs: Secondary | ICD-10-CM

## 2023-10-30 DIAGNOSIS — A419 Sepsis, unspecified organism: Principal | ICD-10-CM | POA: Diagnosis present

## 2023-10-30 DIAGNOSIS — J189 Pneumonia, unspecified organism: Principal | ICD-10-CM | POA: Diagnosis present

## 2023-10-30 DIAGNOSIS — I2510 Atherosclerotic heart disease of native coronary artery without angina pectoris: Secondary | ICD-10-CM | POA: Insufficient documentation

## 2023-10-30 DIAGNOSIS — R051 Acute cough: Secondary | ICD-10-CM

## 2023-10-30 DIAGNOSIS — E782 Mixed hyperlipidemia: Secondary | ICD-10-CM | POA: Diagnosis present

## 2023-10-30 DIAGNOSIS — Z83719 Family history of colon polyps, unspecified: Secondary | ICD-10-CM

## 2023-10-30 DIAGNOSIS — J441 Chronic obstructive pulmonary disease with (acute) exacerbation: Secondary | ICD-10-CM | POA: Diagnosis present

## 2023-10-30 DIAGNOSIS — R6520 Severe sepsis without septic shock: Secondary | ICD-10-CM | POA: Diagnosis present

## 2023-10-30 DIAGNOSIS — G894 Chronic pain syndrome: Secondary | ICD-10-CM | POA: Insufficient documentation

## 2023-10-30 DIAGNOSIS — Z7982 Long term (current) use of aspirin: Secondary | ICD-10-CM

## 2023-10-30 DIAGNOSIS — R569 Unspecified convulsions: Secondary | ICD-10-CM | POA: Diagnosis present

## 2023-10-30 DIAGNOSIS — Z90710 Acquired absence of both cervix and uterus: Secondary | ICD-10-CM

## 2023-10-30 DIAGNOSIS — E876 Hypokalemia: Secondary | ICD-10-CM | POA: Diagnosis not present

## 2023-10-30 DIAGNOSIS — I10 Essential (primary) hypertension: Secondary | ICD-10-CM | POA: Diagnosis not present

## 2023-10-30 DIAGNOSIS — Z23 Encounter for immunization: Secondary | ICD-10-CM

## 2023-10-30 DIAGNOSIS — Z1152 Encounter for screening for COVID-19: Secondary | ICD-10-CM

## 2023-10-30 DIAGNOSIS — J9601 Acute respiratory failure with hypoxia: Secondary | ICD-10-CM | POA: Diagnosis present

## 2023-10-30 DIAGNOSIS — E861 Hypovolemia: Secondary | ICD-10-CM | POA: Diagnosis present

## 2023-10-30 DIAGNOSIS — K219 Gastro-esophageal reflux disease without esophagitis: Secondary | ICD-10-CM | POA: Diagnosis present

## 2023-10-30 DIAGNOSIS — R059 Cough, unspecified: Secondary | ICD-10-CM | POA: Diagnosis not present

## 2023-10-30 DIAGNOSIS — J440 Chronic obstructive pulmonary disease with acute lower respiratory infection: Secondary | ICD-10-CM | POA: Diagnosis present

## 2023-10-30 DIAGNOSIS — R0602 Shortness of breath: Secondary | ICD-10-CM | POA: Diagnosis not present

## 2023-10-30 DIAGNOSIS — Z79899 Other long term (current) drug therapy: Secondary | ICD-10-CM

## 2023-10-30 DIAGNOSIS — R0781 Pleurodynia: Secondary | ICD-10-CM | POA: Diagnosis not present

## 2023-10-30 DIAGNOSIS — Z888 Allergy status to other drugs, medicaments and biological substances status: Secondary | ICD-10-CM

## 2023-10-30 DIAGNOSIS — Z8614 Personal history of Methicillin resistant Staphylococcus aureus infection: Secondary | ICD-10-CM

## 2023-10-30 DIAGNOSIS — Z881 Allergy status to other antibiotic agents status: Secondary | ICD-10-CM

## 2023-10-30 DIAGNOSIS — Z886 Allergy status to analgesic agent status: Secondary | ICD-10-CM

## 2023-10-30 DIAGNOSIS — Z8349 Family history of other endocrine, nutritional and metabolic diseases: Secondary | ICD-10-CM | POA: Diagnosis not present

## 2023-10-30 DIAGNOSIS — Z885 Allergy status to narcotic agent status: Secondary | ICD-10-CM

## 2023-10-30 DIAGNOSIS — J9811 Atelectasis: Secondary | ICD-10-CM | POA: Diagnosis not present

## 2023-10-30 DIAGNOSIS — D72829 Elevated white blood cell count, unspecified: Secondary | ICD-10-CM

## 2023-10-30 DIAGNOSIS — R918 Other nonspecific abnormal finding of lung field: Secondary | ICD-10-CM | POA: Diagnosis not present

## 2023-10-30 DIAGNOSIS — Z825 Family history of asthma and other chronic lower respiratory diseases: Secondary | ICD-10-CM | POA: Diagnosis not present

## 2023-10-30 DIAGNOSIS — G43909 Migraine, unspecified, not intractable, without status migrainosus: Secondary | ICD-10-CM | POA: Diagnosis present

## 2023-10-30 DIAGNOSIS — N179 Acute kidney failure, unspecified: Secondary | ICD-10-CM | POA: Diagnosis present

## 2023-10-30 DIAGNOSIS — I959 Hypotension, unspecified: Secondary | ICD-10-CM | POA: Diagnosis not present

## 2023-10-30 DIAGNOSIS — F1120 Opioid dependence, uncomplicated: Secondary | ICD-10-CM | POA: Diagnosis not present

## 2023-10-30 LAB — BLOOD GAS, VENOUS
Acid-base deficit: 0.3 mmol/L (ref 0.0–2.0)
Bicarbonate: 24.1 mmol/L (ref 20.0–28.0)
O2 Saturation: 70.2 %
Patient temperature: 37
pCO2, Ven: 38 mm[Hg] — ABNORMAL LOW (ref 44–60)
pH, Ven: 7.41 (ref 7.25–7.43)
pO2, Ven: 38 mm[Hg] (ref 32–45)

## 2023-10-30 LAB — CBC
HCT: 45.8 % (ref 36.0–46.0)
Hemoglobin: 14.7 g/dL (ref 12.0–15.0)
MCH: 29.2 pg (ref 26.0–34.0)
MCHC: 32.1 g/dL (ref 30.0–36.0)
MCV: 91.1 fL (ref 80.0–100.0)
Platelets: 314 10*3/uL (ref 150–400)
RBC: 5.03 MIL/uL (ref 3.87–5.11)
RDW: 15.2 % (ref 11.5–15.5)
WBC: 32.3 10*3/uL — ABNORMAL HIGH (ref 4.0–10.5)
nRBC: 0 % (ref 0.0–0.2)

## 2023-10-30 LAB — BASIC METABOLIC PANEL
Anion gap: 18 — ABNORMAL HIGH (ref 5–15)
BUN: 19 mg/dL (ref 8–23)
CO2: 20 mmol/L — ABNORMAL LOW (ref 22–32)
Calcium: 8.9 mg/dL (ref 8.9–10.3)
Chloride: 98 mmol/L (ref 98–111)
Creatinine, Ser: 1.05 mg/dL — ABNORMAL HIGH (ref 0.44–1.00)
GFR, Estimated: 58 mL/min — ABNORMAL LOW (ref >60)
Glucose, Bld: 89 mg/dL (ref 70–99)
Potassium: 3.7 mmol/L (ref 3.5–5.1)
Sodium: 136 mmol/L (ref 135–145)

## 2023-10-30 LAB — HEPATIC FUNCTION PANEL
ALT: 35 U/L (ref 0–44)
AST: 41 U/L (ref 15–41)
Albumin: 3.4 g/dL — ABNORMAL LOW (ref 3.5–5.0)
Alkaline Phosphatase: 98 U/L (ref 38–126)
Bilirubin, Direct: 0.6 mg/dL — ABNORMAL HIGH (ref 0.0–0.2)
Indirect Bilirubin: 0.6 mg/dL (ref 0.3–0.9)
Total Bilirubin: 1.2 mg/dL (ref 0.0–1.2)
Total Protein: 7.4 g/dL (ref 6.5–8.1)

## 2023-10-30 LAB — RESP PANEL BY RT-PCR (RSV, FLU A&B, COVID)  RVPGX2
Influenza A by PCR: NEGATIVE
Influenza B by PCR: NEGATIVE
Resp Syncytial Virus by PCR: NEGATIVE
SARS Coronavirus 2 by RT PCR: NEGATIVE

## 2023-10-30 LAB — PROTIME-INR
INR: 1.2 (ref 0.8–1.2)
Prothrombin Time: 15.2 s (ref 11.4–15.2)

## 2023-10-30 LAB — BRAIN NATRIURETIC PEPTIDE: B Natriuretic Peptide: 418.7 pg/mL — ABNORMAL HIGH (ref 0.0–100.0)

## 2023-10-30 LAB — LACTIC ACID, PLASMA
Lactic Acid, Venous: 1.6 mmol/L (ref 0.5–1.9)
Lactic Acid, Venous: 1.4 mmol/L (ref 0.5–1.9)

## 2023-10-30 LAB — APTT: aPTT: 31 s (ref 24–36)

## 2023-10-30 MED ORDER — ACETAMINOPHEN 650 MG RE SUPP: 650.0000 mg | Freq: Four times a day (QID) | RECTAL | Status: DC | PRN

## 2023-10-30 MED ORDER — ACETAMINOPHEN 325 MG PO TABS
650.0000 mg | ORAL_TABLET | Freq: Once | ORAL | Status: AC
  Administered 2023-10-30: 650 mg via ORAL
  Filled 2023-10-30: qty 2

## 2023-10-30 MED ORDER — SODIUM CHLORIDE 0.9 % IV SOLN
2.0000 g | Freq: Once | INTRAVENOUS | Status: AC
  Administered 2023-10-30: 2 g via INTRAVENOUS
  Filled 2023-10-30: qty 20

## 2023-10-30 MED ORDER — METHOCARBAMOL 1000 MG/10ML IJ SOLN
500.0000 mg | Freq: Four times a day (QID) | INTRAMUSCULAR | Status: DC | PRN
  Administered 2023-10-31 – 2023-11-02 (×5): 500 mg via INTRAVENOUS
  Filled 2023-10-30 (×8): qty 5

## 2023-10-30 MED ORDER — HYDROCODONE-ACETAMINOPHEN 5-325 MG PO TABS
1.0000 | ORAL_TABLET | Freq: Four times a day (QID) | ORAL | Status: DC | PRN
  Administered 2023-10-30: 1 via ORAL
  Filled 2023-10-30 (×2): qty 1

## 2023-10-30 MED ORDER — SODIUM CHLORIDE 0.9 % IV SOLN
2.0000 g | INTRAVENOUS | Status: AC
  Administered 2023-10-31 – 2023-11-03 (×4): 2 g via INTRAVENOUS
  Filled 2023-10-30 (×4): qty 20

## 2023-10-30 MED ORDER — PREDNISONE 20 MG PO TABS
40.0000 mg | ORAL_TABLET | Freq: Every day | ORAL | Status: AC
  Administered 2023-10-31 – 2023-11-03 (×4): 40 mg via ORAL
  Filled 2023-10-30 (×4): qty 2

## 2023-10-30 MED ORDER — SODIUM CHLORIDE 0.9 % IV SOLN
12.5000 mg | Freq: Four times a day (QID) | INTRAVENOUS | Status: DC | PRN
  Administered 2023-10-31 – 2023-11-02 (×4): 12.5 mg via INTRAVENOUS
  Filled 2023-10-30: qty 12.5
  Filled 2023-10-30 (×2): qty 0.5
  Filled 2023-10-30 (×3): qty 12.5

## 2023-10-30 MED ORDER — SODIUM CHLORIDE 0.9 % IV SOLN
100.0000 mg | Freq: Two times a day (BID) | INTRAVENOUS | Status: DC
  Administered 2023-10-31 – 2023-11-03 (×8): 100 mg via INTRAVENOUS
  Filled 2023-10-30 (×11): qty 100

## 2023-10-30 MED ORDER — LACTATED RINGERS IV BOLUS (SEPSIS)
1000.0000 mL | Freq: Once | INTRAVENOUS | Status: AC
  Administered 2023-10-30: 1000 mL via INTRAVENOUS

## 2023-10-30 MED ORDER — ACETAMINOPHEN 325 MG PO TABS: 650.0000 mg | ORAL_TABLET | Freq: Four times a day (QID) | ORAL | Status: DC | PRN

## 2023-10-30 MED ORDER — METHADONE HCL 10 MG/ML PO CONC
200.0000 mg | Freq: Every day | ORAL | Status: DC
  Administered 2023-10-31 – 2023-11-03 (×4): 200 mg via ORAL
  Filled 2023-10-30 (×5): qty 20

## 2023-10-30 MED ORDER — LATANOPROST 0.005 % OP SOLN
1.0000 [drp] | Freq: Every day | OPHTHALMIC | Status: DC
  Administered 2023-10-31 – 2023-11-02 (×4): 1 [drp] via OPHTHALMIC
  Filled 2023-10-30: qty 2.5

## 2023-10-30 MED ORDER — POLYETHYLENE GLYCOL 3350 17 G PO PACK: 17.0000 g | PACK | Freq: Every day | ORAL | Status: DC | PRN

## 2023-10-30 MED ORDER — HYDROCOD POLI-CHLORPHE POLI ER 10-8 MG/5ML PO SUER
5.0000 mL | Freq: Two times a day (BID) | ORAL | Status: DC | PRN
  Administered 2023-10-30 – 2023-11-02 (×5): 5 mL via ORAL
  Filled 2023-10-30 (×6): qty 5

## 2023-10-30 MED ORDER — ENOXAPARIN SODIUM 40 MG/0.4ML IJ SOSY
40.0000 mg | PREFILLED_SYRINGE | INTRAMUSCULAR | Status: DC
  Administered 2023-10-31 – 2023-11-02 (×4): 40 mg via SUBCUTANEOUS
  Filled 2023-10-30 (×4): qty 0.4

## 2023-10-30 MED ORDER — ACETAMINOPHEN 325 MG PO TABS
325.0000 mg | ORAL_TABLET | Freq: Once | ORAL | Status: AC
  Administered 2023-10-30: 325 mg via ORAL
  Filled 2023-10-30: qty 1

## 2023-10-30 MED ORDER — IPRATROPIUM-ALBUTEROL 0.5-2.5 (3) MG/3ML IN SOLN
3.0000 mL | Freq: Four times a day (QID) | RESPIRATORY_TRACT | Status: DC
  Administered 2023-10-30 – 2023-11-01 (×7): 3 mL via RESPIRATORY_TRACT
  Filled 2023-10-30 (×7): qty 3

## 2023-10-30 MED ORDER — DOXYCYCLINE HYCLATE 100 MG IV SOLR
100.0000 mg | Freq: Once | INTRAVENOUS | Status: AC
  Administered 2023-10-30: 100 mg via INTRAVENOUS
  Filled 2023-10-30 (×2): qty 100

## 2023-10-30 MED ORDER — SODIUM CHLORIDE 0.9% FLUSH
3.0000 mL | Freq: Two times a day (BID) | INTRAVENOUS | Status: DC
  Administered 2023-10-30 – 2023-11-03 (×9): 3 mL via INTRAVENOUS

## 2023-10-30 MED ORDER — METHYLPREDNISOLONE SODIUM SUCC 125 MG IJ SOLR
125.0000 mg | Freq: Once | INTRAMUSCULAR | Status: AC
  Administered 2023-10-30: 125 mg via INTRAVENOUS
  Filled 2023-10-30: qty 2

## 2023-10-30 NOTE — ED Provider Notes (Signed)
[facility] Provider Note    Event Date/Time   First MD Initiated Contact with Patient 10/30/23 1219     (approximate)   History   Shortness of Breath   HPI  [name] is a 68 y.o. female history of CHF, COPD, CAD, GERD, on methadone, hyperlipidemia, seizure, presenting with 2 weeks of shortness of breath and cough.  States that she been feeling weak but did not come to see the doctor.  She denies any nausea, vomiting, diarrhea, chest pain or shortness of breath.  Per EMS when they found her she was 91% on room air, she was placed on 2 L with improvement to 94%.  Per sister patient lives at home alone, has no additional help at home.   Independent chart review of her notes and imaging, had a negative stress test in 2020.   Physical Exam   Triage Vital Signs: ED Triage Vitals [10/30/23 1025]  Encounter Vitals Group     BP (!) 133/105     Systolic BP Percentile      Diastolic BP Percentile      Pulse Rate (!) 102     Resp 18     Temp 98.5 F (36.9 C)     Temp Source Oral     SpO2 91 %     Weight 138 lb 14.2 oz (63 kg)     Height 5\' 1"  (1.549 m)     Head Circumference      Peak Flow      Pain Score 10     Pain Loc      Pain Education      Exclude from Growth Chart     Most recent vital signs: Vitals:   10/30/23 1025 10/30/23 1605  BP: (!) 133/105   Pulse: (!) 102   Resp: 18   Temp: 98.5 F (36.9 C) 98.5 F (36.9 C)  SpO2: 91%      General: Awake, no distress.  CV:  Good peripheral perfusion.  Tachycardic Resp:  Normal effort.  Rales in the left base Abd:  No distention.  Soft nontender Other:  No lower extremity edema   ED Results / Procedures / Treatments   Labs (all labs ordered are listed, but only abnormal results are displayed) Labs Reviewed  BASIC METABOLIC PANEL - Abnormal; Notable for the following components:      Result Value   CO2 20 (*)    Creatinine, Ser 1.05 (*)    GFR, Estimated 58 (*)    Anion gap  18 (*)    All other components within normal limits  CBC - Abnormal; Notable for the following components:   WBC 32.3 (*)    All other components within normal limits  BRAIN NATRIURETIC PEPTIDE - Abnormal; Notable for the following components:   B Natriuretic Peptide 418.7 (*)    All other components within normal limits  BLOOD GAS, VENOUS - Abnormal; Notable for the following components:   pCO2, Ven 38 (*)    All other components within normal limits  HEPATIC FUNCTION PANEL - Abnormal; Notable for the following components:   Albumin 3.4 (*)    Bilirubin, Direct 0.6 (*)    All other components within normal limits  RESP PANEL BY RT-PCR (RSV, FLU A&B, COVID)  RVPGX2  CULTURE, BLOOD (ROUTINE X 2)  CULTURE, BLOOD (ROUTINE X 2)  RESPIRATORY PANEL BY PCR  LACTIC ACID, PLASMA  LACTIC ACID, PLASMA  PROTIME-INR  APTT  LEGIONELLA PNEUMOPHILA SEROGP 1 UR AG  STREP PNEUMONIAE URINARY ANTIGEN  PROCALCITONIN     EKG  Sinus rhythm, rate 99, normal QRS, prolonged QTc, no ischemic ST elevation, T wave flattening to V3, aVF, T wave inversion in 2 3, T wave changes are new compared to prior also did not have prolonged QTc in the past.   RADIOLOGY Independent interpretation of chest x-ray showed bilateral consolidations.   PROCEDURES:  Critical Care performed: No  Procedures   MEDICATIONS ORDERED IN ED: Medications  doxycycline (VIBRAMYCIN) 100 mg in dextrose 5 % 250 mL IVPB (100 mg Intravenous New Bag/Given 10/30/23 1602)  cefTRIAXone (ROCEPHIN) 2 g in sodium chloride 0.9 % 100 mL IVPB (has no administration in time range)  methylPREDNISolone sodium succinate (SOLU-MEDROL) 125 mg/2 mL injection 125 mg (125 mg Intravenous Given 10/30/23 1559)    Followed by  predniSONE (DELTASONE) tablet 40 mg (has no administration in time range)  ipratropium-albuterol (DUONEB) 0.5-2.5 (3) MG/3ML nebulizer solution 3 mL (has no administration in time range)  enoxaparin (LOVENOX) injection 40 mg (has  no administration in time range)  sodium chloride flush (NS) 0.9 % injection 3 mL (3 mLs Intravenous Given 10/30/23 1602)  acetaminophen (TYLENOL) tablet 650 mg (has no administration in time range)    Or  acetaminophen (TYLENOL) suppository 650 mg (has no administration in time range)  HYDROcodone-acetaminophen (NORCO/VICODIN) 5-325 MG per tablet 1-2 tablet (has no administration in time range)  methocarbamol (ROBAXIN) injection 500 mg (has no administration in time range)  chlorpheniramine-HYDROcodone (TUSSIONEX) 10-8 MG/5ML suspension 5 mL (has no administration in time range)  polyethylene glycol (MIRALAX / GLYCOLAX) packet 17 g (has no administration in time range)  promethazine (PHENERGAN) 12.5 mg in sodium chloride 0.9 % 50 mL IVPB (has no administration in time range)  doxycycline (VIBRAMYCIN) 100 mg in sodium chloride 0.9 % 250 mL IVPB (has no administration in time range)  acetaminophen (TYLENOL) tablet 650 mg (650 mg Oral Given 10/30/23 1032)  lactated ringers bolus 1,000 mL (0 mLs Intravenous Stopped 10/30/23 1614)  cefTRIAXone (ROCEPHIN) 2 g in sodium chloride 0.9 % 100 mL IVPB (0 g Intravenous Stopped 10/30/23 1351)  acetaminophen (TYLENOL) tablet 325 mg (325 mg Oral Given 10/30/23 1358)     IMPRESSION / MDM / ASSESSMENT AND PLAN / ED COURSE  I reviewed the triage vital signs and the nursing notes.                              Differential diagnosis includes, but is not limited to, sepsis, pneumonia, RSV, influenza, COVID, also considered COPD exacerbation but no wheezing on exam, considered CHF but patient does not look overly fluid overloaded.  Will get labs, blood cultures, will treat with IV ceftriaxone and doxycycline.  Patient's presentation is most consistent with acute presentation with potential threat to life or bodily function.  Patient presenting with 2 weeks of shortness of breath and cough, found to have multifocal pneumonia.  On my independent review and  interpretation of labs, she is white count 32, respiratory panel is negative, electrolytes all severely deranged.  Given her risk factors as well as elevated white count and multifocal pneumonia this status, she is at high risk and will need to be a manage inpatient.  Consulted hospitalist was agreeable plan for admission will evaluate the patient.  She is admitted.      FINAL CLINICAL IMPRESSION(S) / ED DIAGNOSES   Final diagnoses:  Multifocal  pneumonia  Leukocytosis, unspecified type  Acute cough  Dyspnea, unspecified type     Rx / DC Orders   ED Discharge Orders     None        Note:  This document was prepared using Dragon voice recognition software and may include unintentional dictation errors.    Claybon Jabs, MD 10/30/23 [phone]

## 2023-10-30 NOTE — Assessment & Plan Note (Addendum)
Per chart review, patient has a history of Grade 1 diastolic dysfunction noted in chart with currently elevated BNP, however on examination she appears hypovolemic.  She has never experienced any symptoms of heart failure in the past either.  - Daily weights

## 2023-10-30 NOTE — Assessment & Plan Note (Addendum)
Contacted patient's MAT clinic ([facility], [phone]) and dose verified.   - Continue methadone 20 mL (200mg ) liquid solution daily

## 2023-10-30 NOTE — Assessment & Plan Note (Addendum)
Chest pain at this time is pleuritic in nature and reproducible with light palpation.  No suspicion for ACS at this time. Patient has not been taking her home statin or aspirin.

## 2023-10-30 NOTE — Progress Notes (Signed)
CODE SEPSIS - PHARMACY COMMUNICATION  **Broad Spectrum Antibiotics should be administered within 1 hour of Sepsis diagnosis**  Time Code Sepsis Called/Page Received: 1/16 @1222   Antibiotics Ordered:  Ceftriaxone 2g x 1 Doxycycline 100 mg x a  Time of 1st antibiotic administration: 1/16 @ 1254  Additional action taken by pharmacy: NA  If necessary, Name of Provider/Nurse Contacted: NA  Effie Shy, PharmD Pharmacy Resident  10/30/2023 12:41 PM

## 2023-10-30 NOTE — ED Triage Notes (Signed)
Patient to ED via [facility] from home for SOB, cough and congestion x2 weeks. PT states bilateral rib pain. States hx of pneumonia. PT reports it is painful to take a deep breath.

## 2023-10-30 NOTE — ED Triage Notes (Signed)
First Nurse Note;  Pt via [facility] from home. Pt c/o cough and SOB for the past 2 weeks. Pt c/o bilateral rib pain. EMS reports rhonchi. Cough is productive. Denies hx of COPD/CHF. EMS report 91% on RA and EMS placed on 2L [place] 94%  140/80 BP  114 HR  24 RR 123/ CBG  Afebrile

## 2023-10-30 NOTE — Progress Notes (Signed)
Elink is following code sepsis 

## 2023-10-30 NOTE — Assessment & Plan Note (Signed)
Patient presenting with tachycardia, significant leukocytosis in the setting of community-acquired pneumonia, complicated by AKI.  - Telemetry monitoring - S/p 1 L bolus.  No indication for 30 cc/kg protocol - Continue Rocephin and doxycycline - Blood cultures pending

## 2023-10-30 NOTE — Assessment & Plan Note (Addendum)
Mild AKI in the setting of poor p.o. intake and nausea/vomiting.  - S/p 1 L bolus - Push oral rehydration - Phenergan as needed for nausea - Hold nephrotoxic agents - Repeat BMP in the a.m.

## 2023-10-30 NOTE — Assessment & Plan Note (Addendum)
In the setting of community-acquired pneumonia  - S/p Solu-Medrol 125 mg once - Start prednisone 40 mg tomorrow to complete a 5-day course - DuoNebs every 6 hours

## 2023-10-30 NOTE — Sepsis Progress Note (Signed)
eLink is following this Code Sepsis. °

## 2023-10-30 NOTE — Assessment & Plan Note (Addendum)
Patient reporting productive cough and shortness of breath, in addition to nausea/vomiting and abdominal pain.  Strongly suspect this may be viral in nature, however will continue broad coverage for now  - Respiratory viral panel pending - Procalcitonin - Continue Rocephin and doxycycline - Supportive management with Tussionex and guaifenesin - Oxycodone for pleuritic chest pain

## 2023-10-30 NOTE — ED Provider Triage Note (Signed)
Emergency Medicine Provider Triage Evaluation Note  [name] , a 68 y.o. female  was evaluated in triage.  Pt complains of short of breath cough congestion for 2 weeks.  Now has rib pain..  Review of Systems  Positive:  Negative:   Physical Exam  BP (!) 133/105   Pulse (!) 102   Temp 98.5 F (36.9 C) (Oral)   Resp 18   Ht 5\' 1"  (1.549 m)   Wt 63 kg   SpO2 91%   BMI 26.24 kg/m  Gen:   Awake, no distress   Resp:  Normal effort  MSK:   Moves extremities without difficulty  Other:  Medical Decision Making  Medically screening exam initiated at 10:28 AM.  Appropriate orders placed.  [name] was informed that the remainder of the evaluation will be completed by another provider, this initial triage assessment does not replace that evaluation, and the importance of remaining in the ED until their evaluation is complete.  Respiratory protocols, Tylenol for pain   Faythe Ghee, PA-C 10/30/23 1028

## 2023-10-30 NOTE — H&P (Addendum)
History and Physical    Patient: [name] [id number] DOB: 12/14/1955 DOA: 10/30/2023 DOS: the patient was seen and examined on 10/30/2023 PCP: Orpah Cobb, MD  Patient coming from: Home  Chief Complaint:  Chief Complaint  Patient presents with   Shortness of Breath   HPI: [name] is a 68 y.o. female with medical history significant of COPD, CAD, diastolic dysfunction, chronic hepatitis C, who presents to the ED due to shortness of breath.  [name] states that for the last 10 days, she has been experiencing generalized malaise, chills, nausea, vomiting, poor appetite and shortness of breath.  Over the last 24 hours, she has noticed significant worsening in her shortness of breath and has been experiencing significant chest pain with deep coughing.  Chest pain is all over and worsened if palpated.  She notes that her abdominal wall also feels sore from coughing.  She states her cough is productive.  She denies any palpitations or lower extremity swelling.  ED course: On arrival to the ED, patient was hypertensive at 133/105 with heart rate of 102.  She was saturating at 91% on room air.  She was afebrile at 98.5.  Initial workup notable for WBC of 32.3, bicarb 20, anion gap 18, creatinine 1.05 with GFR 58.  BNP elevated at 418.  VBG with pH of 7.41 and pCO2 of 38.  COVID-19, influenza and RSV PCR negative.  Chest x-ray with bilateral opacities concerning for multifocal pneumonia.  Patient started on Rocephin, doxycycline, IV fluids.  TRH contacted for admission.    Review of Systems: As mentioned in the history of present illness. All other systems reviewed and are negative.  Past Medical History:  Diagnosis Date   Acute hepatitis C without mention of hepatic coma(070.51)    Anemia    Arthritis    back   Carpal tunnel syndrome    Chest pain, unspecified    Chronic airway obstruction, not elsewhere classified    Chronic hepatitis C without hepatic coma (HCC) 02/09/2018    Colitis    Coronary atherosclerosis of unspecified type of vessel, native or graft    Disorders of bursae and tendons in shoulder region, unspecified    Drug-induced skin rash 04/07/2018   GERD (gastroesophageal reflux disease)    Hardware complicating wound infection (HCC) 02/09/2018   Headache    migraines   Hepatitis    hx of hepatitis C   Infective otitis externa, unspecified    Mixed hyperlipidemia    MRSA infection 02/09/2018   Nonspecific abnormal results of thyroid function study    Other bursitis disorders    Other symptoms involving nervous and musculoskeletal systems(781.99)    Pneumonia    Seizures (HCC)    none for 4 years (as of 01/2018) "pseudo seizures"   Thrush 04/07/2018   Past Surgical History:  Procedure Laterality Date   ABDOMINAL HYSTERECTOMY     CARDIAC CATHETERIZATION     CARDIAC CATHETERIZATION N/A 06/25/2016   Procedure: Left Heart Cath and Coronary Angiography;  Surgeon: Orpah Cobb, MD;  Location: [facility];  Service: Cardiovascular;  Laterality: N/A;   CESAREAN SECTION     CHOLECYSTECTOMY     COLONOSCOPY     IR FLUORO GUIDED NEEDLE PLC ASPIRATION/INJECTION LOC  12/27/2017   LEFT HEART CATH AND CORONARY ANGIOGRAPHY N/A 06/06/2022   Procedure: LEFT HEART CATH AND CORONARY ANGIOGRAPHY;  Surgeon: Orpah Cobb, MD;  Location: [facility];  Service: Cardiovascular;  Laterality: N/A;  LUMBAR FUSION  01/13/2018   Transforaminal lumbar interbody fusion L4-5 with screws, cages and rods, local bone graft, Allograft, Vivigen, Decompression L4-5 and L5-S1   TONSILLECTOMY     Social History:  reports that she quit smoking about 16 months ago. Her smoking use included cigarettes. She started smoking about 54 years ago. She has never used smokeless tobacco. She reports that she does not drink alcohol and does not use drugs.  Allergies  Allergen Reactions   Acetaminophen-Codeine Other (See Comments)    Hand get red, feels like it's on fire    Morphine And Codeine     "makes me feel like i'm on fire"   Nitroglycerin     Causes a headache   Tramadol Hcl     Gives her the shakes   Azithromycin Itching and Rash    Family History  Problem Relation Age of Onset   Colonic polyp Mother    Thyroid disease Mother    COPD Mother    Macular degeneration Mother    Uterine cancer Maternal Grandmother    Crohn's disease Daughter 13       colon resection   Irritable bowel syndrome Daughter    Heart murmur Daughter        SVT   Colon cancer Neg Hx     Prior to Admission medications   Medication Sig Start Date End Date Taking? Authorizing Provider  albuterol (VENTOLIN HFA) 108 (90 Base) MCG/ACT inhaler Inhale 2 puffs into the lungs every 6 (six) hours as needed for wheezing or shortness of breath. 06/07/22   Orpah Cobb, MD  amLODipine (NORVASC) 2.5 MG tablet Take 1 tablet (2.5 mg total) by mouth daily. 06/08/22   Orpah Cobb, MD  aspirin EC 81 MG tablet Take 1 tablet (81 mg total) by mouth daily. Swallow whole. 03/15/22   Kerrin Champagne, MD  atorvastatin (LIPITOR) 40 MG tablet Take 1 tablet (40 mg total) by mouth daily at 6 PM. 11/26/18   Orpah Cobb, MD  escitalopram (LEXAPRO) 10 MG tablet Take 1 tablet (10 mg total) by mouth daily. 07/09/22 07/09/23  Raechel Chute, MD  latanoprost (XALATAN) 0.005 % ophthalmic solution Place 1 drop into both eyes at bedtime. 10/17/23   [provider]  metoprolol tartrate (LOPRESSOR) 25 MG tablet Take 0.5 tablets (12.5 mg total) by mouth 2 (two) times daily. 06/07/22   Orpah Cobb, MD  potassium chloride (KLOR-CON M) 10 MEQ tablet Take 1 tablet (10 mEq total) by mouth daily. 06/08/22   Orpah Cobb, MD    Physical Exam: Vitals:   10/30/23 1025 10/30/23 1605  BP: (!) 133/105   Pulse: (!) 102   Resp: 18   Temp: 98.5 F (36.9 C) 98.5 F (36.9 C)  TempSrc: Oral Oral  SpO2: 91%   Weight: 63 kg   Height: 5\' 1"  (1.549 m)    Physical Exam Vitals and nursing note reviewed.   Constitutional:      Appearance: She is ill-appearing.  HENT:     Head: Normocephalic and atraumatic.     Mouth/Throat:     Comments: Very dry oropharynx Eyes:     Extraocular Movements: Extraocular movements intact.     Pupils: Pupils are equal, round, and reactive to light.  Cardiovascular:     Rate and Rhythm: Normal rate and regular rhythm.  Pulmonary:     Breath sounds: Wheezing and rhonchi present.  Chest:     Chest wall: Tenderness (tenderness to light palpation throughout) present.  Abdominal:  Palpations: Abdomen is soft.     Tenderness: There is abdominal tenderness (throughout).  Musculoskeletal:     Right lower leg: No edema.     Left lower leg: No edema.  Skin:    General: Skin is warm and dry.  Neurological:     General: No focal deficit present.     Mental Status: She is alert and oriented to person, place, and time.  Psychiatric:        Mood and Affect: Mood normal.        Behavior: Behavior normal.    Data Reviewed: CBC with WBC of 32.3, hemoglobin of 14.8 and sodium of 136, potassium 3.7, bicarb 20, BUN 19, creatinine 1.05, anion gap 18 and GFR 58 118 Lactic acid 1.6 and then 1.4 Cova-19, influenza and RSV PCR negative  EKG personally reviewed.  Sinus rhythm with rate of 99.  TC prolongation noted at 577.  DG Chest 2 View Result Date: 10/30/2023 CLINICAL DATA:  Cough.  Shortness of breath. EXAM: CHEST - 2 VIEW COMPARISON:  Chest radiograph dated June 21, 2022. FINDINGS: The heart size and mediastinal contours are within normal limits. Left mid and lower lung zone opacities. Mild left basilar atelectasis. Streaky opacities at the right mid to lower lung zone. No pleural effusion or pneumothorax. No acute osseous abnormality. IMPRESSION: Bilateral mid to lower lung zone opacities, more pronounced on the left. These findings are concerning for multifocal pneumonia. Recommend continued short-term follow-up. Electronically Signed   By: Hart Robinsons  M.D.   On: 10/30/2023 11:43   Results are pending, will review when available.  Assessment and Plan:  * Sepsis [facility]) Patient presenting with tachycardia, significant leukocytosis in the setting of community-acquired pneumonia, complicated by AKI.  - Telemetry monitoring - S/p 1 L bolus.  No indication for 30 cc/kg protocol - Continue Rocephin and doxycycline - Blood cultures pending  CAP (community acquired pneumonia) Patient reporting productive cough and shortness of breath, in addition to nausea/vomiting and abdominal pain.  Strongly suspect this may be viral in nature, however will continue broad coverage for now  - Respiratory viral panel pending - Procalcitonin - Continue Rocephin and doxycycline - Supportive management with Tussionex and guaifenesin - Oxycodone for pleuritic chest pain  COPD with acute exacerbation (HCC) In the setting of community-acquired pneumonia  - S/p Solu-Medrol 125 mg once - Start prednisone 40 mg tomorrow to complete a 5-day course - DuoNebs every 6 hours  AKI (acute kidney injury) (HCC) Mild AKI in the setting of poor p.o. intake and nausea/vomiting.  - S/p 1 L bolus - Push oral rehydration - Phenergan as needed for nausea - Hold nephrotoxic agents - Repeat BMP in the a.m.  Elevated brain natriuretic peptide (BNP) level Per chart review, patient has a history of Grade 1 diastolic dysfunction noted in chart with currently elevated BNP, however on examination she appears hypovolemic.  She has never experienced any symptoms of heart failure in the past either.  - Daily weights  Chronic pain syndrome Contacted patient's MAT clinic ([facility], [phone]) and dose verified.   - Continue methadone 20 mL (200mg ) liquid solution daily  CAD (coronary artery disease) Chest pain at this time is pleuritic in nature and reproducible with light palpation.  No suspicion for ACS at this time. Patient has not been taking her home  statin or aspirin.  Advance Care Planning:   Code Status: Full Code verified by patient  Consults: None   Family Communication: Patient's sister  updated at bedside  Severity of Illness: The appropriate patient status for this patient is OBSERVATION. Observation status is judged to be reasonable and necessary in order to provide the required intensity of service to ensure the patient's safety. The patient's presenting symptoms, physical exam findings, and initial radiographic and laboratory data in the context of their medical condition is felt to place them at decreased risk for further clinical deterioration. Furthermore, it is anticipated that the patient will be medically stable for discharge from the hospital within 2 midnights of admission.   Author: Verdene Lennert, MD 10/30/2023 6:10 PM  For on call review [email or web address].

## 2023-10-31 DIAGNOSIS — G894 Chronic pain syndrome: Secondary | ICD-10-CM | POA: Diagnosis present

## 2023-10-31 DIAGNOSIS — Z825 Family history of asthma and other chronic lower respiratory diseases: Secondary | ICD-10-CM | POA: Diagnosis not present

## 2023-10-31 DIAGNOSIS — Z8349 Family history of other endocrine, nutritional and metabolic diseases: Secondary | ICD-10-CM | POA: Diagnosis not present

## 2023-10-31 DIAGNOSIS — E782 Mixed hyperlipidemia: Secondary | ICD-10-CM | POA: Diagnosis present

## 2023-10-31 DIAGNOSIS — Z87891 Personal history of nicotine dependence: Secondary | ICD-10-CM | POA: Diagnosis not present

## 2023-10-31 DIAGNOSIS — N179 Acute kidney failure, unspecified: Secondary | ICD-10-CM

## 2023-10-31 DIAGNOSIS — Z8614 Personal history of Methicillin resistant Staphylococcus aureus infection: Secondary | ICD-10-CM | POA: Diagnosis not present

## 2023-10-31 DIAGNOSIS — A419 Sepsis, unspecified organism: Secondary | ICD-10-CM | POA: Diagnosis present

## 2023-10-31 DIAGNOSIS — B182 Chronic viral hepatitis C: Secondary | ICD-10-CM | POA: Diagnosis present

## 2023-10-31 DIAGNOSIS — Z83719 Family history of colon polyps, unspecified: Secondary | ICD-10-CM | POA: Diagnosis not present

## 2023-10-31 DIAGNOSIS — J189 Pneumonia, unspecified organism: Secondary | ICD-10-CM | POA: Diagnosis present

## 2023-10-31 DIAGNOSIS — Z1152 Encounter for screening for COVID-19: Secondary | ICD-10-CM | POA: Diagnosis not present

## 2023-10-31 DIAGNOSIS — Z23 Encounter for immunization: Secondary | ICD-10-CM | POA: Diagnosis not present

## 2023-10-31 DIAGNOSIS — I2510 Atherosclerotic heart disease of native coronary artery without angina pectoris: Secondary | ICD-10-CM | POA: Diagnosis present

## 2023-10-31 DIAGNOSIS — E876 Hypokalemia: Secondary | ICD-10-CM | POA: Diagnosis not present

## 2023-10-31 DIAGNOSIS — R6520 Severe sepsis without septic shock: Secondary | ICD-10-CM | POA: Diagnosis present

## 2023-10-31 DIAGNOSIS — Z8049 Family history of malignant neoplasm of other genital organs: Secondary | ICD-10-CM | POA: Diagnosis not present

## 2023-10-31 DIAGNOSIS — E861 Hypovolemia: Secondary | ICD-10-CM | POA: Diagnosis present

## 2023-10-31 DIAGNOSIS — R569 Unspecified convulsions: Secondary | ICD-10-CM | POA: Diagnosis present

## 2023-10-31 DIAGNOSIS — F1120 Opioid dependence, uncomplicated: Secondary | ICD-10-CM | POA: Diagnosis not present

## 2023-10-31 DIAGNOSIS — Z7982 Long term (current) use of aspirin: Secondary | ICD-10-CM | POA: Diagnosis not present

## 2023-10-31 DIAGNOSIS — J440 Chronic obstructive pulmonary disease with acute lower respiratory infection: Secondary | ICD-10-CM | POA: Diagnosis present

## 2023-10-31 DIAGNOSIS — I5032 Chronic diastolic (congestive) heart failure: Secondary | ICD-10-CM | POA: Diagnosis present

## 2023-10-31 DIAGNOSIS — J441 Chronic obstructive pulmonary disease with (acute) exacerbation: Secondary | ICD-10-CM | POA: Diagnosis present

## 2023-10-31 DIAGNOSIS — J9601 Acute respiratory failure with hypoxia: Secondary | ICD-10-CM | POA: Diagnosis present

## 2023-10-31 LAB — BASIC METABOLIC PANEL
Anion gap: 17 — ABNORMAL HIGH (ref 5–15)
BUN: 30 mg/dL — ABNORMAL HIGH (ref 8–23)
CO2: 20 mmol/L — ABNORMAL LOW (ref 22–32)
Calcium: 8.8 mg/dL — ABNORMAL LOW (ref 8.9–10.3)
Chloride: 98 mmol/L (ref 98–111)
Creatinine, Ser: 0.94 mg/dL (ref 0.44–1.00)
GFR, Estimated: >60 mL/min (ref >60)
Glucose, Bld: 116 mg/dL — ABNORMAL HIGH (ref 70–99)
Potassium: 3.1 mmol/L — ABNORMAL LOW (ref 3.5–5.1)
Sodium: 135 mmol/L (ref 135–145)

## 2023-10-31 LAB — HIV ANTIBODY (ROUTINE TESTING W REFLEX): HIV Screen 4th Generation wRfx: NONREACTIVE

## 2023-10-31 LAB — RESPIRATORY PANEL BY PCR

## 2023-10-31 LAB — CBC
HCT: 41.2 % (ref 36.0–46.0)
Hemoglobin: 13.5 g/dL (ref 12.0–15.0)
MCH: 29.2 pg (ref 26.0–34.0)
MCHC: 32.8 g/dL (ref 30.0–36.0)
MCV: 89 fL (ref 80.0–100.0)
Platelets: 291 10*3/uL (ref 150–400)
RBC: 4.63 MIL/uL (ref 3.87–5.11)
RDW: 15.3 % (ref 11.5–15.5)
WBC: 25.8 10*3/uL — ABNORMAL HIGH (ref 4.0–10.5)
nRBC: 0 % (ref 0.0–0.2)

## 2023-10-31 LAB — PROCALCITONIN: Procalcitonin: 5.8 ng/mL

## 2023-10-31 LAB — STREP PNEUMONIAE URINARY ANTIGEN: Strep Pneumo Urinary Antigen: NEGATIVE

## 2023-10-31 MED ORDER — HYDROCODONE-ACETAMINOPHEN 5-325 MG PO TABS
1.0000 | ORAL_TABLET | Freq: Four times a day (QID) | ORAL | Status: DC | PRN
  Administered 2023-10-31 – 2023-11-03 (×10): 1 via ORAL
  Filled 2023-10-31 (×10): qty 1

## 2023-10-31 NOTE — ED Notes (Signed)
Informed rn bed assigned 

## 2023-10-31 NOTE — ED Notes (Signed)
This RN attempted to wean patient off of nasal cannula, per orders from Donnajean Lopes, Charity fundraiser. Patient tolerated well but started to desat after about 5 minutes. Patient's oxygen decreased to 88% on room air. Patient was placed back on nasal cannula at 2L. Patient's O2 sat is 93% at this time.

## 2023-10-31 NOTE — Progress Notes (Signed)
PROGRESS NOTE    [name]  YQM:[id number] DOB: 08-19-1956 DOA: 10/30/2023 PCP: Orpah Cobb, MD  [id number]  LOS: 0 days   Brief hospital course:   Assessment & Plan: [name] is a 68 y.o. female with medical history significant of COPD, CAD, diastolic dysfunction, chronic hepatitis C, who presents to the ED due to shortness of breath for the last 10 days    * Severe Sepsis (HCC) Patient presenting with tachycardia, significant leukocytosis in the setting of community-acquired pneumonia, complicated by AKI.  CAP (community acquired pneumonia) Patient reporting productive cough and shortness of breath, in addition to nausea/vomiting and abdominal pain.  CXR showed Bilateral mid to lower lung zone opacities.  Maybe viral, however, procal 5.8, and WBC 32.3.  started on ceftriaxone and doxy.   --covid and RVP neg. Plan: --cont ceftriaxone and doxy - Supportive management with Tussionex and guaifenesin - Norco for pleuritic chest pain  COPD with acute exacerbation (HCC) In the setting of community-acquired pneumonia  - S/p Solu-Medrol 125 mg once --cont prednisone - DuoNebs every 6 hours  Acute hypoxemic respiratory failure 2/2 to above --desat to 88% on room air, needed 2L with sats 93%. --Continue supplemental O2 to keep sats >=92%, wean as tolerated  AKI (acute kidney injury) (HCC) Mild AKI in the setting of poor p.o. intake and nausea/vomiting. --Cr 1.05 on presentation, baseline around 0.7 --improved with IVF  Elevated brain natriuretic peptide (BNP) level Per chart review, patient has a history of Grade 1 diastolic dysfunction noted in chart with currently elevated BNP, however on examination she appears hypovolemic.  She has never experienced any symptoms of heart failure in the past either.  - Daily weights  Chronic pain syndrome Contacted patient's MAT clinic ([facility], [phone]) and dose verified.   - Continue methadone 20  mL (200mg ) liquid solution daily  CAD (coronary artery disease) Chest pain at this time is pleuritic in nature and reproducible with light palpation.  No suspicion for ACS at this time. Patient has not been taking her home statin or aspirin.  Hypokalemia --monitor and supplement PRN   DVT prophylaxis: Lovenox SQ Code Status: Full code  Family Communication: family updated at bedside today Level of care: Med-Surg Dispo:   The patient is from: home Anticipated d/c is to: home Anticipated d/c date is: 2-3 days   Subjective and Interval History:  Pt reported feeling better, had cough with sputum production.  Complained of back pain.   Objective: Vitals:   10/31/23 1345 10/31/23 1400 10/31/23 1412 10/31/23 1642  BP: 130/71 115/75  (!) 142/84  Pulse: 83 75  85  Resp: 13 13  17   Temp:   98 F (36.7 C) (!) 97.5 F (36.4 C)  TempSrc:   Oral Oral  SpO2: 94% 92%  95%  Weight:      Height:        Intake/Output Summary (Last 24 hours) at 10/31/2023 1918 Last data filed at 10/31/2023 1355 Gross per 24 hour  Intake 356 ml  Output --  Net 356 ml   Filed Weights   10/30/23 1025  Weight: 63 kg    Examination:   Constitutional: NAD, AAOx3 HEENT: conjunctivae and lids normal, EOMI CV: No cyanosis.   RESP: normal respiratory effort, on 3L Neuro: II - XII grossly intact.   Psych: Normal mood and affect.  Appropriate judgement and reason   Data Reviewed: I have personally reviewed labs and imaging studies  Time spent: 50 minutes  Darlin Priestly, MD [facility] If 7PM-7AM, please contact night-coverage 10/31/2023, 7:18 PM

## 2023-11-01 DIAGNOSIS — A419 Sepsis, unspecified organism: Secondary | ICD-10-CM | POA: Diagnosis not present

## 2023-11-01 DIAGNOSIS — R6520 Severe sepsis without septic shock: Secondary | ICD-10-CM | POA: Diagnosis not present

## 2023-11-01 DIAGNOSIS — N179 Acute kidney failure, unspecified: Secondary | ICD-10-CM | POA: Diagnosis not present

## 2023-11-01 LAB — CBC
HCT: 40.4 % (ref 36.0–46.0)
Hemoglobin: 13.4 g/dL (ref 12.0–15.0)
MCH: 29.5 pg (ref 26.0–34.0)
MCHC: 33.2 g/dL (ref 30.0–36.0)
MCV: 89 fL (ref 80.0–100.0)
Platelets: 321 10*3/uL (ref 150–400)
RBC: 4.54 MIL/uL (ref 3.87–5.11)
RDW: 15.6 % — ABNORMAL HIGH (ref 11.5–15.5)
WBC: 21.5 10*3/uL — ABNORMAL HIGH (ref 4.0–10.5)
nRBC: 0 % (ref 0.0–0.2)

## 2023-11-01 LAB — BASIC METABOLIC PANEL
Anion gap: 9 (ref 5–15)
BUN: 41 mg/dL — ABNORMAL HIGH (ref 8–23)
CO2: 24 mmol/L (ref 22–32)
Calcium: 8.8 mg/dL — ABNORMAL LOW (ref 8.9–10.3)
Chloride: 103 mmol/L (ref 98–111)
Creatinine, Ser: 0.88 mg/dL (ref 0.44–1.00)
GFR, Estimated: >60 mL/min (ref >60)
Glucose, Bld: 114 mg/dL — ABNORMAL HIGH (ref 70–99)
Potassium: 3.3 mmol/L — ABNORMAL LOW (ref 3.5–5.1)
Sodium: 136 mmol/L (ref 135–145)

## 2023-11-01 LAB — MAGNESIUM: Magnesium: 2.8 mg/dL — ABNORMAL HIGH (ref 1.7–2.4)

## 2023-11-01 MED ORDER — PNEUMOCOCCAL 20-VAL CONJ VACC 0.5 ML IM SUSY
0.5000 mL | PREFILLED_SYRINGE | INTRAMUSCULAR | Status: AC
Start: 2023-11-02 — End: 2023-11-02
  Administered 2023-11-02: 0.5 mL via INTRAMUSCULAR
  Filled 2023-11-01: qty 0.5

## 2023-11-01 MED ORDER — POTASSIUM CHLORIDE CRYS ER 20 MEQ PO TBCR
40.0000 meq | EXTENDED_RELEASE_TABLET | Freq: Once | ORAL | Status: AC
  Administered 2023-11-01: 40 meq via ORAL
  Filled 2023-11-01: qty 2

## 2023-11-01 MED ORDER — IPRATROPIUM-ALBUTEROL 0.5-2.5 (3) MG/3ML IN SOLN: 3.0000 mL | Freq: Four times a day (QID) | RESPIRATORY_TRACT | Status: DC | PRN

## 2023-11-01 MED ORDER — IPRATROPIUM-ALBUTEROL 0.5-2.5 (3) MG/3ML IN SOLN
3.0000 mL | Freq: Four times a day (QID) | RESPIRATORY_TRACT | Status: DC
  Administered 2023-11-01 – 2023-11-02 (×4): 3 mL via RESPIRATORY_TRACT
  Filled 2023-11-01 (×4): qty 3

## 2023-11-01 MED ORDER — INFLUENZA VAC A&B SURF ANT ADJ 0.5 ML IM SUSY
0.5000 mL | PREFILLED_SYRINGE | INTRAMUSCULAR | Status: AC
  Administered 2023-11-02: 0.5 mL via INTRAMUSCULAR
  Filled 2023-11-01: qty 0.5

## 2023-11-01 NOTE — Plan of Care (Signed)

## 2023-11-01 NOTE — Progress Notes (Signed)
PROGRESS NOTE    [name]  ZOX:[id number] DOB: August 31, 1956 DOA: 10/30/2023 PCP: Orpah Cobb, MD  [id number]  LOS: 1 day   Brief hospital course:   Assessment & Plan: [name] is a 68 y.o. female with medical history significant of COPD, CAD, diastolic dysfunction, chronic hepatitis C, who presents to the ED due to shortness of breath for the last 10 days    * Severe Sepsis (HCC) Patient presenting with tachycardia, significant leukocytosis in the setting of community-acquired pneumonia, complicated by AKI.  CAP (community acquired pneumonia) Patient reporting productive cough and shortness of breath, in addition to nausea/vomiting and abdominal pain.  CXR showed Bilateral mid to lower lung zone opacities.  Maybe viral, however, procal 5.8, and WBC 32.3.  started on ceftriaxone and doxy.   --covid and RVP neg. Plan: --cont ceftriaxone and doxy - Supportive management with Tussionex and guaifenesin - Norco for pleuritic chest pain  COPD with acute exacerbation (HCC) In the setting of community-acquired pneumonia - S/p Solu-Medrol 125 mg once --cont prednisone --scheduled DuoNeb  Acute hypoxemic respiratory failure 2/2 to above --desat to 88% on room air, needed 2L with sats 93%. --Continue supplemental O2 to keep sats >=92%, wean as tolerated  AKI (acute kidney injury) (HCC) Mild AKI in the setting of poor p.o. intake and nausea/vomiting. --Cr 1.05 on presentation, baseline around 0.7 --improved with IVF --oral hydration now  Elevated brain natriuretic peptide (BNP) level Per chart review, patient has a history of Grade 1 diastolic dysfunction noted in chart with currently elevated BNP, however on examination she appears hypovolemic.  She has never experienced any symptoms of heart failure in the past either.  Chronic pain syndrome Contacted patient's MAT clinic ([facility], [phone]) and dose verified.  - Continue methadone 20 mL  (200mg ) liquid solution daily  CAD (coronary artery disease) Chest pain at this time is pleuritic in nature and reproducible with light palpation.  No suspicion for ACS at this time. Patient has not been taking her home statin or aspirin.  Hypokalemia --monitor and supplement PRN   DVT prophylaxis: Lovenox SQ Code Status: Full code  Family Communication:  Level of care: Med-Surg Dispo:   The patient is from: home Anticipated d/c is to: home Anticipated d/c date is: 2-3 days   Subjective and Interval History:  Pt reported not feeling well, but improved from prior.  Still coughing.   Objective: Vitals:   11/01/23 0744 11/01/23 0801 11/01/23 1356 11/01/23 1455  BP:  115/60  133/69  Pulse:  65  65  Resp:  18  18  Temp:  (!) 97.4 F (36.3 C)  97.9 F (36.6 C)  TempSrc:  Oral    SpO2: 96% 94% 97% 95%  Weight:      Height:        Intake/Output Summary (Last 24 hours) at 11/01/2023 1829 Last data filed at 11/01/2023 0412 Gross per 24 hour  Intake 587.41 ml  Output 300 ml  Net 287.41 ml   Filed Weights   10/30/23 1025  Weight: 63 kg    Examination:   Constitutional: NAD, AAOx3 HEENT: conjunctivae and lids normal, EOMI CV: No cyanosis.   RESP: normal respiratory effort, on 2L Neuro: II - XII grossly intact.   Psych: Normal mood and affect.  Appropriate judgement and reason   Data Reviewed: I have personally reviewed labs and imaging studies  Time spent: 35 minutes  Darlin Priestly, MD [facility] If 7PM-7AM, please contact night-coverage 11/01/2023,  6:29 PM

## 2023-11-02 DIAGNOSIS — A419 Sepsis, unspecified organism: Secondary | ICD-10-CM | POA: Diagnosis not present

## 2023-11-02 DIAGNOSIS — N179 Acute kidney failure, unspecified: Secondary | ICD-10-CM | POA: Diagnosis not present

## 2023-11-02 DIAGNOSIS — R6520 Severe sepsis without septic shock: Secondary | ICD-10-CM | POA: Diagnosis not present

## 2023-11-02 LAB — BASIC METABOLIC PANEL
Anion gap: 8 (ref 5–15)
BUN: 26 mg/dL — ABNORMAL HIGH (ref 8–23)
CO2: 25 mmol/L (ref 22–32)
Calcium: 8.2 mg/dL — ABNORMAL LOW (ref 8.9–10.3)
Chloride: 107 mmol/L (ref 98–111)
Creatinine, Ser: 0.68 mg/dL (ref 0.44–1.00)
GFR, Estimated: >60 mL/min (ref >60)
Glucose, Bld: 74 mg/dL (ref 70–99)
Potassium: 3.4 mmol/L — ABNORMAL LOW (ref 3.5–5.1)
Sodium: 140 mmol/L (ref 135–145)

## 2023-11-02 LAB — CBC
HCT: 37.6 % (ref 36.0–46.0)
Hemoglobin: 12.1 g/dL (ref 12.0–15.0)
MCH: 29.5 pg (ref 26.0–34.0)
MCHC: 32.2 g/dL (ref 30.0–36.0)
MCV: 91.7 fL (ref 80.0–100.0)
Platelets: 307 10*3/uL (ref 150–400)
RBC: 4.1 MIL/uL (ref 3.87–5.11)
RDW: 15.8 % — ABNORMAL HIGH (ref 11.5–15.5)
WBC: 15.6 10*3/uL — ABNORMAL HIGH (ref 4.0–10.5)
nRBC: 0 % (ref 0.0–0.2)

## 2023-11-02 LAB — LEGIONELLA PNEUMOPHILA SEROGP 1 UR AG: L. pneumophila Serogp 1 Ur Ag: NEGATIVE

## 2023-11-02 MED ORDER — IPRATROPIUM-ALBUTEROL 0.5-2.5 (3) MG/3ML IN SOLN: 3.0000 mL | Freq: Three times a day (TID) | RESPIRATORY_TRACT | Status: DC

## 2023-11-02 MED ORDER — IPRATROPIUM-ALBUTEROL 0.5-2.5 (3) MG/3ML IN SOLN
3.0000 mL | Freq: Three times a day (TID) | RESPIRATORY_TRACT | Status: DC
  Administered 2023-11-02 – 2023-11-03 (×4): 3 mL via RESPIRATORY_TRACT
  Filled 2023-11-02 (×4): qty 3

## 2023-11-02 MED ORDER — SODIUM CHLORIDE 3 % IN NEBU
4.0000 mL | INHALATION_SOLUTION | Freq: Two times a day (BID) | RESPIRATORY_TRACT | Status: DC
  Administered 2023-11-02 – 2023-11-03 (×2): 4 mL via RESPIRATORY_TRACT
  Filled 2023-11-02 (×4): qty 4

## 2023-11-02 MED ORDER — POTASSIUM CHLORIDE CRYS ER 20 MEQ PO TBCR
40.0000 meq | EXTENDED_RELEASE_TABLET | Freq: Once | ORAL | Status: AC
  Administered 2023-11-02: 40 meq via ORAL
  Filled 2023-11-02: qty 2

## 2023-11-02 MED ORDER — QUETIAPINE FUMARATE 25 MG PO TABS
50.0000 mg | ORAL_TABLET | Freq: Every day | ORAL | Status: DC
  Administered 2023-11-02: 50 mg via ORAL
  Filled 2023-11-02: qty 2

## 2023-11-02 NOTE — Plan of Care (Signed)

## 2023-11-02 NOTE — TOC CM/SW Note (Signed)
Transition of Care [facility]) - Inpatient Brief Assessment   Patient Details  Name: [name] MRN: [id number] Date of Birth: 1956/04/17  Transition of Care [facility]) CM/SW Contact:    Liliana Cline, LCSW Phone Number: 11/02/2023, 10:46 AM   Clinical Narrative:    Transition of Care Asessment: Insurance and Status: Insurance coverage has been reviewed Patient has primary care physician: Yes     Prior/Current Home Services: No current home services Social Drivers of Health Review: SDOH reviewed no interventions necessary Readmission risk has been reviewed: Yes Transition of care needs: no transition of care needs at this time

## 2023-11-02 NOTE — Progress Notes (Signed)
PROGRESS NOTE    [name]  [id number] DOB: 02/28/1956 DOA: 10/30/2023 PCP: Orpah Cobb, MD  [id number]  LOS: 2 days   Brief hospital course:   Assessment & Plan: [name] is a 68 y.o. female with medical history significant of COPD, CAD, diastolic dysfunction, chronic hepatitis C, who presents to the ED due to shortness of breath for the last 10 days    * Severe Sepsis (HCC) Patient presenting with tachycardia, significant leukocytosis in the setting of community-acquired pneumonia, complicated by AKI.  CAP (community acquired pneumonia) Patient reporting productive cough and shortness of breath, in addition to nausea/vomiting and abdominal pain.  CXR showed Bilateral mid to lower lung zone opacities.  Maybe viral, however, procal 5.8, and WBC 32.3.  started on ceftriaxone and doxy.   --covid and RVP neg. Plan: --cont ceftriaxone and doxy - Supportive management with Tussionex and guaifenesin - Norco for pleuritic chest pain  COPD with acute exacerbation (HCC) In the setting of community-acquired pneumonia - S/p Solu-Medrol 125 mg once --cont prednisone --scheduled DuoNeb --add hypertonic saline neb BID  Acute hypoxemic respiratory failure 2/2 to above --desat to 88% on room air, needed 2L with sats 93%. --Continue supplemental O2 to keep sats >=92%, wean as tolerated  AKI (acute kidney injury) (HCC) Mild AKI in the setting of poor p.o. intake and nausea/vomiting. --Cr 1.05 on presentation, baseline around 0.7 --improved with IVF --oral hydration now  Elevated brain natriuretic peptide (BNP) level Per chart review, patient has a history of Grade 1 diastolic dysfunction noted in chart with currently elevated BNP, however on examination she appears hypovolemic.  She has never experienced any symptoms of heart failure in the past either.  Chronic pain syndrome Contacted patient's MAT clinic ([facility], [phone]) and dose verified.   - Continue methadone 20 mL (200mg ) liquid solution daily  CAD (coronary artery disease) Chest pain at this time is pleuritic in nature and reproducible with light palpation.  No suspicion for ACS at this time. Patient has not been taking her home statin or aspirin.  Hypokalemia --monitor and supplement PRN   DVT prophylaxis: Lovenox SQ Code Status: Full code  Family Communication:  Level of care: Med-Surg Dispo:   The patient is from: home Anticipated d/c is to: home Anticipated d/c date is: 1-2 days   Subjective and Interval History:  Pt reported not feeling well, having sore throat, couldn't sleep, and couldn't cough up sputum.   Objective: Vitals:   11/02/23 0443 11/02/23 0747 11/02/23 1202 11/02/23 1513  BP: 133/81 (!) 164/71  (!) 144/69  Pulse: 67 61  68  Resp:  18  18  Temp: 98.9 F (37.2 C) 97.8 F (36.6 C)  97.9 F (36.6 C)  TempSrc: Oral Oral  Oral  SpO2: 97% 100% 100% 93%  Weight:      Height:        Intake/Output Summary (Last 24 hours) at 11/02/2023 1751 Last data filed at 11/01/2023 2200 Gross per 24 hour  Intake 3 ml  Output --  Net 3 ml   Filed Weights   10/30/23 1025  Weight: 63 kg    Examination:   Constitutional: NAD, AAOx3 HEENT: conjunctivae and lids normal, EOMI CV: No cyanosis.   RESP: normal respiratory effort, mild crackles over posterior lower fields, on 2L Extremities: No effusions, edema in BLE SKIN: warm, dry Neuro: II - XII grossly intact.   Psych: Normal mood and affect.  Appropriate judgement and reason   Data  Reviewed: I have personally reviewed labs and imaging studies  Time spent: 35 minutes  Darlin Priestly, MD [facility] If 7PM-7AM, please contact night-coverage 11/02/2023, 5:51 PM

## 2023-11-02 NOTE — Plan of Care (Signed)

## 2023-11-03 DIAGNOSIS — R6520 Severe sepsis without septic shock: Secondary | ICD-10-CM | POA: Diagnosis not present

## 2023-11-03 DIAGNOSIS — A419 Sepsis, unspecified organism: Secondary | ICD-10-CM | POA: Diagnosis not present

## 2023-11-03 DIAGNOSIS — N179 Acute kidney failure, unspecified: Secondary | ICD-10-CM | POA: Diagnosis not present

## 2023-11-03 LAB — BASIC METABOLIC PANEL
Anion gap: 8 (ref 5–15)
BUN: 22 mg/dL (ref 8–23)
CO2: 27 mmol/L (ref 22–32)
Calcium: 8.4 mg/dL — ABNORMAL LOW (ref 8.9–10.3)
Chloride: 108 mmol/L (ref 98–111)
Creatinine, Ser: 0.63 mg/dL (ref 0.44–1.00)
GFR, Estimated: >60 mL/min (ref >60)
Glucose, Bld: 72 mg/dL (ref 70–99)
Potassium: 4.1 mmol/L (ref 3.5–5.1)
Sodium: 143 mmol/L (ref 135–145)

## 2023-11-03 LAB — CBC
HCT: 40 % (ref 36.0–46.0)
Hemoglobin: 12.8 g/dL (ref 12.0–15.0)
MCH: 29.1 pg (ref 26.0–34.0)
MCHC: 32 g/dL (ref 30.0–36.0)
MCV: 90.9 fL (ref 80.0–100.0)
Platelets: 318 10*3/uL (ref 150–400)
RBC: 4.4 MIL/uL (ref 3.87–5.11)
RDW: 16 % — ABNORMAL HIGH (ref 11.5–15.5)
WBC: 15.8 10*3/uL — ABNORMAL HIGH (ref 4.0–10.5)
nRBC: 0 % (ref 0.0–0.2)

## 2023-11-03 MED ORDER — HYDROCOD POLI-CHLORPHE POLI ER 10-8 MG/5ML PO SUER: 5.0000 mL | Freq: Two times a day (BID) | ORAL | 0 refills | Status: DC | PRN

## 2023-11-03 MED ORDER — METHADONE HCL 10 MG/ML PO CONC: 200.0000 mg | Freq: Every morning | ORAL | Status: AC

## 2023-11-03 NOTE — Discharge Summary (Signed)
Physician Discharge Summary   [name]  female DOB: Jan 15, 1956  ZOX:[id number]  PCP: Orpah Cobb, MD  Admit date: 10/30/2023 Discharge date: 11/03/2023  Admitted From: home Disposition:  home CODE STATUS: Full code   Hospital Course:  For full details, please see H&P, progress notes, consult notes and ancillary notes.  Briefly,  [name] is a 68 y.o. female with medical history significant of COPD, CAD, diastolic dysfunction, chronic hepatitis C, who presented to the ED due to shortness of breath for the last 10 days    * Severe Sepsis [facility]) Patient presenting with tachycardia, significant leukocytosis in the setting of community-acquired pneumonia, complicated by AKI.   CAP (community acquired pneumonia) Patient reporting productive cough and shortness of breath, in addition to nausea/vomiting and abdominal pain.  CXR showed Bilateral mid to lower lung zone opacities.  Maybe viral, however, procal 5.8, and WBC 32.3.  Covid and RVP neg.  started on ceftriaxone and doxy, completed 5 days.   COPD with acute exacerbation (HCC) In the setting of community-acquired pneumonia - S/p Solu-Medrol 125 mg once f/b prednisone 40 mg daily for 4 days. --received DuoNeb and hypertonic saline neb BID   Acute hypoxemic respiratory failure 2/2 to above --desat to 88% on room air, needed 2L with sats 93%. --weaned down to room air prior to discharge.   AKI (acute kidney injury) (HCC) Mild AKI in the setting of poor p.o. intake and nausea/vomiting. --Cr 1.05 on presentation, baseline around 0.7.  Cr improved with IVF, was 0.63 prior to discharge.   Chronic pain syndrome Contacted patient's MAT clinic ([facility], [phone]) and dose verified.  - cont methadone 20 mL (200mg ) liquid solution daily   CAD (coronary artery disease) Chest pain at this time is pleuritic in nature and reproducible with light palpation.  No suspicion for ACS at this time. Patient  has not been taking her home statin or aspirin.   Hypokalemia --monitored and supplemented PRN --not taking potassium supplement PTA   Unless noted above, medications under "STOP" list are ones pt was not taking PTA.  Discharge Diagnoses:  Principal Problem:   Sepsis (HCC) Active Problems:   CAP (community acquired pneumonia)   COPD with acute exacerbation (HCC)   AKI (acute kidney injury) (HCC)   Elevated brain natriuretic peptide (BNP) level   CAD (coronary artery disease)   Chronic pain syndrome   Pneumonia   30 Day Unplanned Readmission Risk Score    Flowsheet Row ED to Hosp-Admission (Current) from 10/30/2023 in [facility]  30 Day Unplanned Readmission Risk Score (%) 13.45 Filed at 11/03/2023 1200       This score is the patient's risk of an unplanned readmission within 30 days of being discharged (0 -100%). The score is based on dignosis, age, lab data, medications, orders, and past utilization.   Low:  0-14.9   Medium: 15-21.9   High: 22-29.9   Extreme: 30 and above         Discharge Instructions:  Allergies as of 11/03/2023       Reactions   Acetaminophen-codeine Other (See Comments)   Hand get red, feels like it's on fire   Morphine And Codeine    "makes me feel like i'm on fire"   Nitroglycerin    Causes a headache   Tramadol Hcl    Gives her the shakes   Azithromycin Itching, Rash        Medication List  STOP taking these medications    albuterol 108 (90 Base) MCG/ACT inhaler Commonly known as: VENTOLIN HFA   amLODipine 2.5 MG tablet Commonly known as: NORVASC   aspirin EC 81 MG tablet   atorvastatin 40 MG tablet Commonly known as: LIPITOR   metoprolol tartrate 25 MG tablet Commonly known as: LOPRESSOR   potassium chloride 10 MEQ tablet Commonly known as: KLOR-CON M       TAKE these medications    acetaminophen 500 MG tablet Commonly known as: TYLENOL Take 500 mg by mouth every 6  (six) hours as needed for mild pain (pain score 1-3).   chlorpheniramine-HYDROcodone 10-8 MG/5ML Commonly known as: TUSSIONEX Take 5 mLs by mouth every 12 (twelve) hours as needed for cough.   escitalopram 10 MG tablet Commonly known as: Lexapro Take 1 tablet (10 mg total) by mouth daily.   latanoprost 0.005 % ophthalmic solution Commonly known as: XALATAN Place 1 drop into both eyes at bedtime.   methadone 10 MG/ML solution Commonly known as: DOLOPHINE Take 20 mLs (200 mg total) by mouth in the morning. Home med. What changed:  how much to take additional instructions         Follow-up Information     Orpah Cobb, MD Follow up in 1 week(s).   Specialty: Cardiology Contact information: [address] [place] [address] [phone]                 Allergies  Allergen Reactions   Acetaminophen-Codeine Other (See Comments)    Hand get red, feels like it's on fire   Morphine And Codeine     "makes me feel like i'm on fire"   Nitroglycerin     Causes a headache   Tramadol Hcl     Gives her the shakes   Azithromycin Itching and Rash     The results of significant diagnostics from this hospitalization (including imaging, microbiology, ancillary and laboratory) are listed below for reference.   Consultations:   Procedures/Studies: DG Chest 2 View Result Date: 10/30/2023 CLINICAL DATA:  Cough.  Shortness of breath. EXAM: CHEST - 2 VIEW COMPARISON:  Chest radiograph dated June 21, 2022. FINDINGS: The heart size and mediastinal contours are within normal limits. Left mid and lower lung zone opacities. Mild left basilar atelectasis. Streaky opacities at the right mid to lower lung zone. No pleural effusion or pneumothorax. No acute osseous abnormality. IMPRESSION: Bilateral mid to lower lung zone opacities, more pronounced on the left. These findings are concerning for multifocal pneumonia. Recommend continued short-term follow-up. Electronically  Signed   By: Hart Robinsons M.D.   On: 10/30/2023 11:43   Intravitreal Injection, Pharmacologic Agent - OS - Left Eye Result Date: 10/13/2023 Time Out 10/13/2023. 8:26 AM. Confirmed correct patient, procedure, site, and patient consented. Anesthesia Topical anesthesia was used. Anesthetic medications included Lidocaine 2%, Proparacaine 0.5%. Procedure Preparation included 5% betadine to ocular surface, eyelid speculum. A (32g) needle was used. Injection: 2 mg aflibercept 2 MG/0.05ML   Route: Intravitreal, Site: Left Eye   NDC: [id number], Lot: [id number], Expiration date: 02/09/2025, Waste: 0 mL Post-op Post injection exam found visual acuity of at least counting fingers. The patient tolerated the procedure well. There were no complications. The patient received written and verbal post procedure care education.   OCT, Retina - OU - Both Eyes Result Date: 10/13/2023 Right Eye Quality was good. Central Foveal Thickness: 337. Progression has been stable. Findings include no IRF, no SRF, abnormal foveal contour, retinal drusen ,  outer retinal atrophy [facility] -- large central disciform scar; no fluid). Left Eye Quality was good. Central Foveal Thickness: 341. Progression has improved. Findings include no IRF, no SRF, abnormal foveal contour, retinal drusen , subretinal hyper-reflective material, choroidal neovascular membrane (Central CNV with interval improvement in SRF and edema; persistent SRHM). Notes *Images captured and stored on drive Diagnosis / Impression: Exudative ARMD OU OD: Central SRHM -- large central disciform scar; no fluid OS: Central CNV with interval improvement in SRF and edema; persistent SRHM Clinical management: See below Abbreviations: NFP - Normal foveal profile. CME - cystoid macular edema. PED - pigment epithelial detachment. IRF - intraretinal fluid. SRF - subretinal fluid. EZ - ellipsoid zone. ERM - epiretinal membrane. ORA - outer retinal atrophy. ORT - outer retinal  tubulation. SRHM - subretinal hyper-reflective material. IRHM - intraretinal hyper-reflective material      Labs: BNP (last 3 results) Recent Labs    10/30/23 1028  BNP 418.7*   Basic Metabolic Panel: Recent Labs  Lab 10/30/23 1027 10/31/23 0437 11/01/23 0552 11/02/23 0457 11/03/23 0424  NA 136 135 136 140 143  K 3.7 3.1* 3.3* 3.4* 4.1  CL 98 98 103 107 108  CO2 20* 20* 24 25 27   GLUCOSE 89 116* 114* 74 72  BUN 19 30* 41* 26* 22  CREATININE 1.05* 0.94 0.88 0.68 0.63  CALCIUM 8.9 8.8* 8.8* 8.2* 8.4*  MG  --   --  2.8*  --   --    Liver Function Tests: Recent Labs  Lab 10/30/23 1028  AST 41  ALT 35  ALKPHOS 98  BILITOT 1.2  PROT 7.4  ALBUMIN 3.4*   No results for input(s): "LIPASE", "AMYLASE" in the last 168 hours. No results for input(s): "AMMONIA" in the last 168 hours. CBC: Recent Labs  Lab 10/30/23 1027 10/31/23 0437 11/01/23 0552 11/02/23 0457 11/03/23 0424  WBC 32.3* 25.8* 21.5* 15.6* 15.8*  HGB 14.7 13.5 13.4 12.1 12.8  HCT 45.8 41.2 40.4 37.6 40.0  MCV 91.1 89.0 89.0 91.7 90.9  PLT 314 291 321 307 318   Cardiac Enzymes: No results for input(s): "CKTOTAL", "CKMB", "CKMBINDEX", "TROPONINI" in the last 168 hours. BNP: Invalid input(s): "POCBNP" CBG: No results for input(s): "GLUCAP" in the last 168 hours. D-Dimer No results for input(s): "DDIMER" in the last 72 hours. Hgb A1c No results for input(s): "HGBA1C" in the last 72 hours. Lipid Profile No results for input(s): "CHOL", "HDL", "LDLCALC", "TRIG", "CHOLHDL", "LDLDIRECT" in the last 72 hours. Thyroid function studies No results for input(s): "TSH", "T4TOTAL", "T3FREE", "THYROIDAB" in the last 72 hours.  Invalid input(s): "FREET3" Anemia work up No results for input(s): "VITAMINB12", "FOLATE", "FERRITIN", "TIBC", "IRON", "RETICCTPCT" in the last 72 hours. Urinalysis    Component Value Date/Time   COLORURINE AMBER (A) 01/15/2018 2210   APPEARANCEUR CLEAR 01/15/2018 2210   LABSPEC  1.008 01/15/2018 2210   PHURINE 7.0 01/15/2018 2210   GLUCOSEU NEGATIVE 01/15/2018 2210   HGBUR MODERATE (A) 01/15/2018 2210   BILIRUBINUR NEGATIVE 01/15/2018 2210   KETONESUR NEGATIVE 01/15/2018 2210   PROTEINUR NEGATIVE 01/15/2018 2210   UROBILINOGEN 0.2 10/12/2013 0902   NITRITE NEGATIVE 01/15/2018 2210   LEUKOCYTESUR NEGATIVE 01/15/2018 2210   Sepsis Labs Recent Labs  Lab 10/31/23 0437 11/01/23 0552 11/02/23 0457 11/03/23 0424  WBC 25.8* 21.5* 15.6* 15.8*   Microbiology Recent Results (from the past 240 hours)  Resp panel by RT-PCR (RSV, Flu A&B, Covid) Anterior Nasal Swab     Status: None  Collection Time: 10/30/23 10:28 AM   Specimen: Anterior Nasal Swab  Result Value Ref Range Status   SARS Coronavirus 2 by RT PCR NEGATIVE NEGATIVE Final    Comment: (NOTE) SARS-CoV-2 target nucleic acids are NOT DETECTED.  The SARS-CoV-2 RNA is generally detectable in upper respiratory specimens during the acute phase of infection. The lowest concentration of SARS-CoV-2 viral copies this assay can detect is 138 copies/mL. A negative result does not preclude SARS-Cov-2 infection and should not be used as the sole basis for treatment or other patient management decisions. A negative result may occur with  improper specimen collection/handling, submission of specimen other than nasopharyngeal swab, presence of viral mutation(s) within the areas targeted by this assay, and inadequate number of viral copies(<138 copies/mL). A negative result must be combined with clinical observations, patient history, and epidemiological information. The expected result is Negative.  Fact Sheet for Patients:  BloggerCourse.com  Fact Sheet for Healthcare Providers:  SeriousBroker.it  This test is no t yet approved or cleared by the [place] FDA and  has been authorized for detection and/or diagnosis of SARS-CoV-2 by FDA under an Emergency Use  Authorization (EUA). This EUA will remain  in effect (meaning this test can be used) for the duration of the COVID-19 declaration under Section 564(b)(1) of the Act, 21 U.S.C.section 360bbb-3(b)(1), unless the authorization is terminated  or revoked sooner.       Influenza A by PCR NEGATIVE NEGATIVE Final   Influenza B by PCR NEGATIVE NEGATIVE Final    Comment: (NOTE) The Xpert Xpress SARS-CoV-2/FLU/RSV plus assay is intended as an aid in the diagnosis of influenza from Nasopharyngeal swab specimens and should not be used as a sole basis for treatment. Nasal washings and aspirates are unacceptable for Xpert Xpress SARS-CoV-2/FLU/RSV testing.  Fact Sheet for Patients: BloggerCourse.com  Fact Sheet for Healthcare Providers: SeriousBroker.it  This test is not yet approved or cleared by the [place] FDA and has been authorized for detection and/or diagnosis of SARS-CoV-2 by FDA under an Emergency Use Authorization (EUA). This EUA will remain in effect (meaning this test can be used) for the duration of the COVID-19 declaration under Section 564(b)(1) of the Act, 21 U.S.C. section 360bbb-3(b)(1), unless the authorization is terminated or revoked.     Resp Syncytial Virus by PCR NEGATIVE NEGATIVE Final    Comment: (NOTE) Fact Sheet for Patients: BloggerCourse.com  Fact Sheet for Healthcare Providers: SeriousBroker.it  This test is not yet approved or cleared by the [place] FDA and has been authorized for detection and/or diagnosis of SARS-CoV-2 by FDA under an Emergency Use Authorization (EUA). This EUA will remain in effect (meaning this test can be used) for the duration of the COVID-19 declaration under Section 564(b)(1) of the Act, 21 U.S.C. section 360bbb-3(b)(1), unless the authorization is terminated or revoked.  Performed at [facility], [address] [place], [place] [address]   Culture, blood (routine x 2)     Status: None (Preliminary result)   Collection Time: 10/30/23 12:42 PM   Specimen: BLOOD  Result Value Ref Range Status   Specimen Description BLOOD LEFT [facility]  Final   Special Requests   Final    BOTTLES DRAWN AEROBIC AND ANAEROBIC Blood Culture adequate volume   Culture   Final    NO GROWTH 3 DAYS Performed at [facility], [address], [place], [place] [address]    Report Status PENDING  Incomplete  Culture, blood (routine x 2)  Status: None (Preliminary result)   Collection Time: 10/30/23 12:43 PM   Specimen: BLOOD  Result Value Ref Range Status   Specimen Description   Final    BLOOD RIGHT [facility] Performed at [facility], [address], [place], [place] [address]    Special Requests   Final    BOTTLES DRAWN AEROBIC AND ANAEROBIC Blood Culture adequate volume Performed at [facility], [address] [place], [place] [address]    Culture   Final    NO GROWTH 4 DAYS Performed at [facility], [address] [place], [place] [address]    Report Status PENDING  Incomplete  Respiratory (~20 pathogens) panel by PCR     Status: None   Collection Time: 10/31/23  4:00 AM   Specimen: Nasopharyngeal Swab; Respiratory  Result Value Ref Range Status   Adenovirus NOT DETECTED NOT DETECTED Final   Coronavirus 229E NOT DETECTED NOT DETECTED Final    Comment: (NOTE) The Coronavirus on the Respiratory Panel, DOES NOT test for the novel  Coronavirus (2019 nCoV)    Coronavirus HKU1 NOT DETECTED NOT DETECTED Final   Coronavirus NL63 NOT DETECTED NOT DETECTED Final   Coronavirus OC43 NOT DETECTED NOT DETECTED Final   Metapneumovirus NOT DETECTED NOT DETECTED Final   Rhinovirus / Enterovirus NOT DETECTED NOT DETECTED Final   Influenza A NOT DETECTED NOT DETECTED Final   Influenza B NOT DETECTED NOT DETECTED Final   Parainfluenza Virus 1 NOT DETECTED NOT DETECTED Final    Parainfluenza Virus 2 NOT DETECTED NOT DETECTED Final   Parainfluenza Virus 3 NOT DETECTED NOT DETECTED Final   Parainfluenza Virus 4 NOT DETECTED NOT DETECTED Final   Respiratory Syncytial Virus NOT DETECTED NOT DETECTED Final   Bordetella pertussis NOT DETECTED NOT DETECTED Final   Bordetella Parapertussis NOT DETECTED NOT DETECTED Final   Chlamydophila pneumoniae NOT DETECTED NOT DETECTED Final   Mycoplasma pneumoniae NOT DETECTED NOT DETECTED Final    Comment: Performed at [facility], [address] [place], [place] [address]     Total time spend on discharging this patient, including the last patient exam, discussing the hospital stay, instructions for ongoing care as it relates to all pertinent caregivers, as well as preparing the medical discharge records, prescriptions, and/or referrals as applicable, is 35 minutes.    Darlin Priestly, MD  [facility] 11/03/2023, 3:15 PM

## 2023-11-03 NOTE — Care Management Important Message (Signed)
Important Message  Patient Details  Name: [name] MRN: [id number] Date of Birth: 10/10/1956   Important Message Given:  Yes - Medicare IM     Sherilyn Banker 11/03/2023, 11:19 AM

## 2023-11-03 NOTE — Progress Notes (Signed)
Patient maintained oxygen sat of 90% or higher on room air during ambulation in the corridor.  [name]

## 2023-11-03 NOTE — Progress Notes (Shared)
[facility] - Clinic Note  11/10/2023   CHIEF COMPLAINT Patient presents for No chief complaint on file.  HISTORY OF PRESENT ILLNESS: [name] is a 68 y.o. female who presents to the clinic today for:    Pt states   Referring physician: Orpah Cobb, MD [address] [place],  [place] [address]  HISTORICAL INFORMATION:  Selected notes from the MEDICAL RECORD NUMBER Referred by Dr. Bascom Levels for concern of ARMD  LEE:  Ocular Hx- PMH-   CURRENT MEDICATIONS: No current facility-administered medications for this visit. (Ophthalmic Drugs)   No current outpatient medications on file. (Ophthalmic Drugs)   Facility-Administered Medications Ordered in Other Visits (Ophthalmic Drugs)  Medication Route   latanoprost (XALATAN) 0.005 % ophthalmic solution 1 drop Both Eyes   No current facility-administered medications for this visit. (Other)   No current outpatient medications on file. (Other)   Facility-Administered Medications Ordered in Other Visits (Other)  Medication Route   acetaminophen (TYLENOL) tablet 650 mg Oral   Or   acetaminophen (TYLENOL) suppository 650 mg Rectal   chlorpheniramine-HYDROcodone (TUSSIONEX) 10-8 MG/5ML suspension 5 mL Oral   doxycycline (VIBRAMYCIN) 100 mg in sodium chloride 0.9 % 250 mL IVPB Intravenous   enoxaparin (LOVENOX) injection 40 mg Subcutaneous   HYDROcodone-acetaminophen (NORCO/VICODIN) 5-325 MG per tablet 1 tablet Oral   ipratropium-albuterol (DUONEB) 0.5-2.5 (3) MG/3ML nebulizer solution 3 mL Nebulization   ipratropium-albuterol (DUONEB) 0.5-2.5 (3) MG/3ML nebulizer solution 3 mL Nebulization   methadone (DOLOPHINE) 10 MG/ML solution 200 mg Oral   methocarbamol (ROBAXIN) injection 500 mg Intravenous   polyethylene glycol (MIRALAX / GLYCOLAX) packet 17 g Oral   promethazine (PHENERGAN) 12.5 mg in sodium chloride 0.9 % 50 mL IVPB Intravenous   QUEtiapine (SEROQUEL) tablet 50 mg Oral   sodium chloride flush (NS)  0.9 % injection 3 mL Intravenous   sodium chloride HYPERTONIC 3 % nebulizer solution 4 mL Nebulization   REVIEW OF SYSTEMS:    ALLERGIES Allergies  Allergen Reactions   Acetaminophen-Codeine Other (See Comments)    Hand get red, feels like it's on fire   Morphine And Codeine     "makes me feel like i'm on fire"   Nitroglycerin     Causes a headache   Tramadol Hcl     Gives her the shakes   Azithromycin Itching and Rash   PAST MEDICAL HISTORY Past Medical History:  Diagnosis Date   Acute hepatitis C without mention of hepatic coma(070.51)    Anemia    Arthritis    back   Carpal tunnel syndrome    Chest pain, unspecified    Chronic airway obstruction, not elsewhere classified    Chronic hepatitis C without hepatic coma (HCC) 02/09/2018   Colitis    Coronary atherosclerosis of unspecified type of vessel, native or graft    Disorders of bursae and tendons in shoulder region, unspecified    Drug-induced skin rash 04/07/2018   GERD (gastroesophageal reflux disease)    Hardware complicating wound infection (HCC) 02/09/2018   Headache    migraines   Hepatitis    hx of hepatitis C   Infective otitis externa, unspecified    Mixed hyperlipidemia    MRSA infection 02/09/2018   Nonspecific abnormal results of thyroid function study    Other bursitis disorders    Other symptoms involving nervous and musculoskeletal systems(781.99)    Pneumonia    Seizures (HCC)    none for 4 years (as of 01/2018) "pseudo seizures"  Thrush 04/07/2018   Past Surgical History:  Procedure Laterality Date   ABDOMINAL HYSTERECTOMY     CARDIAC CATHETERIZATION     CARDIAC CATHETERIZATION N/A 06/25/2016   Procedure: Left Heart Cath and Coronary Angiography;  Surgeon: Orpah Cobb, MD;  Location: [facility];  Service: Cardiovascular;  Laterality: N/A;   CESAREAN SECTION     CHOLECYSTECTOMY     COLONOSCOPY     IR FLUORO GUIDED NEEDLE PLC ASPIRATION/INJECTION LOC  12/27/2017   LEFT HEART CATH AND  CORONARY ANGIOGRAPHY N/A 06/06/2022   Procedure: LEFT HEART CATH AND CORONARY ANGIOGRAPHY;  Surgeon: Orpah Cobb, MD;  Location: [facility];  Service: Cardiovascular;  Laterality: N/A;   LUMBAR FUSION  01/13/2018   Transforaminal lumbar interbody fusion L4-5 with screws, cages and rods, local bone graft, Allograft, Vivigen, Decompression L4-5 and L5-S1   TONSILLECTOMY     FAMILY HISTORY Family History  Problem Relation Age of Onset   Colonic polyp Mother    Thyroid disease Mother    COPD Mother    Macular degeneration Mother    Uterine cancer Maternal Grandmother    Crohn's disease Daughter 40       colon resection   Irritable bowel syndrome Daughter    Heart murmur Daughter        SVT   Colon cancer Neg Hx    SOCIAL HISTORY Social History   Tobacco Use   Smoking status: Former    Current packs/day: 0.00    Types: Cigarettes    Start date: 06/16/1969    Quit date: 06/16/2022    Years since quitting: 1.3   Smokeless tobacco: Never   Tobacco comments:    2 cigarettes a day  Vaping Use   Vaping status: Former   Quit date: 02/26/2022  Substance Use Topics   Alcohol use: No   Drug use: No       OPHTHALMIC EXAM:  Not recorded    IMAGING AND PROCEDURES  Imaging and Procedures for 11/10/2023         ASSESSMENT/PLAN:   ICD-10-CM   1. Exudative age-related macular degeneration of both eyes with active choroidal neovascularization (HCC)  H35.3231     2. Combined forms of age-related cataract of both eyes  H25.813     3. Essential hypertension  I10     4. Hypertensive retinopathy of both eyes  H35.033      Exudative age related macular degeneration, both eyes - OD -- inactive with disciform scar - OS w/ central edema and shallow SRF - s/p IVA OS #1 (09.03.24), #2 (10.01.24), #3 (10.29.24) -- IVA resistance - s/p IVE OS #1 (12.02.24), ##2 (12.30.24) - BCVA OD 20/150; OS 20/40 -- stable OU  - OCT shows OD: Central SRHM -- large central disciform scar; no  fluid; OS: central CNV with interval improvement in SRF and edema; persistent SRHM at 4 wks  - recommend IVE OS #3 today, 01.27.25 w/ f/u in 4 wks  - pt wishes to proceed with injection  - RBA of procedure discussed, questions answered - IVE informed consent obtained and signed, 12.02.24 (OS) - see procedure note  - f/u in 4 wks, DFE, OCT  2,3. Hypertensive retinopathy OU - discussed importance of tight BP control - monitor   4. Mixed Cataract OU - The symptoms of cataract, surgical options, and treatments and risks were discussed with patient. - discussed diagnosis and progression - monitor  Ophthalmic Meds Ordered this visit:  No orders of the  defined types were placed in this encounter.    No follow-ups on file.  There are no Patient Instructions on file for this visit.  Explained the diagnoses, plan, and follow up with the patient and they expressed understanding.  Patient expressed understanding of the importance of proper follow up care.   This document serves as a record of services personally performed by Karie Chimera, MD, PhD. It was created on their behalf by Glee Arvin. Manson Passey, OA an ophthalmic technician. The creation of this record is the provider's dictation and/or activities during the visit.    Electronically signed by: Glee Arvin. Manson Passey, OA 11/03/23 12:16 PM   Karie Chimera, M.D., Ph.D. Diseases & Surgery of the Retina and Vitreous [facility] 11/10/2023     Abbreviations: M myopia (nearsighted); A astigmatism; H hyperopia (farsighted); P presbyopia; Mrx spectacle prescription;  CTL contact lenses; OD right eye; OS left eye; OU both eyes  XT exotropia; ET esotropia; PEK punctate epithelial keratitis; PEE punctate epithelial erosions; DES dry eye syndrome; MGD meibomian gland dysfunction; ATs artificial tears; PFAT's preservative free artificial tears; NSC nuclear sclerotic cataract; PSC posterior subcapsular cataract; ERM epi-retinal  membrane; PVD posterior vitreous detachment; RD retinal detachment; DM diabetes mellitus; DR diabetic retinopathy; NPDR non-proliferative diabetic retinopathy; PDR proliferative diabetic retinopathy; CSME clinically significant macular edema; DME diabetic macular edema; dbh dot blot hemorrhages; CWS cotton wool spot; POAG primary open angle glaucoma; C/D cup-to-disc ratio; HVF humphrey visual field; GVF goldmann visual field; OCT optical coherence tomography; IOP intraocular pressure; BRVO Branch retinal vein occlusion; CRVO central retinal vein occlusion; CRAO central retinal artery occlusion; BRAO branch retinal artery occlusion; RT retinal tear; SB scleral buckle; PPV pars plana vitrectomy; VH Vitreous hemorrhage; PRP panretinal laser photocoagulation; IVK intravitreal kenalog; VMT vitreomacular traction; MH Macular hole;  NVD neovascularization of the disc; NVE neovascularization elsewhere; AREDS age related eye disease study; ARMD age related macular degeneration; POAG primary open angle glaucoma; EBMD epithelial/anterior basement membrane dystrophy; ACIOL anterior chamber intraocular lens; IOL intraocular lens; PCIOL posterior chamber intraocular lens; Phaco/IOL phacoemulsification with intraocular lens placement; PRK photorefractive keratectomy; LASIK laser assisted in situ keratomileusis; HTN hypertension; DM diabetes mellitus; COPD chronic obstructive pulmonary disease

## 2023-11-03 NOTE — Progress Notes (Signed)
IV removed without complications. Discharge education completed. Patient is getting dressed and waiting for ride.  Cornell Barman Renea Schoonmaker

## 2023-11-03 NOTE — Plan of Care (Signed)
  Problem: Education: Goal: Knowledge of General Education information will improve Description: Including pain rating scale, medication(s)/side effects and non-pharmacologic comfort measures Outcome: Adequate for Discharge   Problem: Health Behavior/Discharge Planning: Goal: Ability to manage health-related needs will improve Outcome: Adequate for Discharge   Problem: Clinical Measurements: Goal: Ability to maintain clinical measurements within normal limits will improve Outcome: Adequate for Discharge Goal: Will remain free from infection Outcome: Adequate for Discharge Goal: Diagnostic test results will improve Outcome: Adequate for Discharge Goal: Respiratory complications will improve Outcome: Adequate for Discharge Goal: Cardiovascular complication will be avoided Outcome: Adequate for Discharge   Problem: Activity: Goal: Risk for activity intolerance will decrease Outcome: Adequate for Discharge   Problem: Nutrition: Goal: Adequate nutrition will be maintained Outcome: Adequate for Discharge   Problem: Coping: Goal: Level of anxiety will decrease Outcome: Adequate for Discharge   Problem: Elimination: Goal: Will not experience complications related to bowel motility Outcome: Adequate for Discharge Goal: Will not experience complications related to urinary retention Outcome: Adequate for Discharge   Problem: Pain Managment: Goal: General experience of comfort will improve and/or be controlled Outcome: Adequate for Discharge   Problem: Safety: Goal: Ability to remain free from injury will improve Outcome: Adequate for Discharge   Problem: Skin Integrity: Goal: Risk for impaired skin integrity will decrease Outcome: Adequate for Discharge   Problem: Education: Goal: Knowledge of disease or condition will improve Outcome: Adequate for Discharge Goal: Knowledge of the prescribed therapeutic regimen will improve Outcome: Adequate for Discharge Goal:  Individualized Educational Video(s) Outcome: Adequate for Discharge   Problem: Activity: Goal: Ability to tolerate increased activity will improve Outcome: Adequate for Discharge Goal: Will verbalize the importance of balancing activity with adequate rest periods Outcome: Adequate for Discharge   Problem: Respiratory: Goal: Ability to maintain a clear airway will improve Outcome: Adequate for Discharge Goal: Levels of oxygenation will improve Outcome: Adequate for Discharge Goal: Ability to maintain adequate ventilation will improve Outcome: Adequate for Discharge   Problem: Activity: Goal: Ability to tolerate increased activity will improve Outcome: Adequate for Discharge   Problem: Clinical Measurements: Goal: Ability to maintain a body temperature in the normal range will improve Outcome: Adequate for Discharge   Problem: Respiratory: Goal: Ability to maintain adequate ventilation will improve Outcome: Adequate for Discharge Goal: Ability to maintain a clear airway will improve Outcome: Adequate for Discharge

## 2023-11-04 LAB — CULTURE, BLOOD (ROUTINE X 2)
Culture: NO GROWTH
Culture: NO GROWTH
Special Requests: ADEQUATE
Special Requests: ADEQUATE

## 2023-11-07 DIAGNOSIS — F1120 Opioid dependence, uncomplicated: Secondary | ICD-10-CM | POA: Diagnosis not present

## 2023-11-10 ENCOUNTER — Encounter (INDEPENDENT_AMBULATORY_CARE_PROVIDER_SITE_OTHER): Payer: Self-pay

## 2023-11-10 ENCOUNTER — Encounter (INDEPENDENT_AMBULATORY_CARE_PROVIDER_SITE_OTHER): Payer: Medicare HMO | Admitting: Ophthalmology

## 2023-11-10 DIAGNOSIS — H353231 Exudative age-related macular degeneration, bilateral, with active choroidal neovascularization: Secondary | ICD-10-CM

## 2023-11-10 DIAGNOSIS — I10 Essential (primary) hypertension: Secondary | ICD-10-CM

## 2023-11-10 DIAGNOSIS — H25813 Combined forms of age-related cataract, bilateral: Secondary | ICD-10-CM

## 2023-11-10 DIAGNOSIS — H35033 Hypertensive retinopathy, bilateral: Secondary | ICD-10-CM

## 2023-11-14 DIAGNOSIS — F1120 Opioid dependence, uncomplicated: Secondary | ICD-10-CM | POA: Diagnosis not present

## 2023-11-17 ENCOUNTER — Encounter (INDEPENDENT_AMBULATORY_CARE_PROVIDER_SITE_OTHER): Payer: Self-pay | Admitting: Ophthalmology

## 2023-11-17 ENCOUNTER — Ambulatory Visit (INDEPENDENT_AMBULATORY_CARE_PROVIDER_SITE_OTHER): Payer: Medicare HMO | Admitting: Ophthalmology

## 2023-11-17 DIAGNOSIS — H35033 Hypertensive retinopathy, bilateral: Secondary | ICD-10-CM

## 2023-11-17 DIAGNOSIS — I10 Essential (primary) hypertension: Secondary | ICD-10-CM

## 2023-11-17 DIAGNOSIS — H353231 Exudative age-related macular degeneration, bilateral, with active choroidal neovascularization: Secondary | ICD-10-CM

## 2023-11-17 DIAGNOSIS — H25813 Combined forms of age-related cataract, bilateral: Secondary | ICD-10-CM

## 2023-11-17 MED ORDER — AFLIBERCEPT 2MG/0.05ML IZ SOLN FOR KALEIDOSCOPE
2.0000 mg | INTRAVITREAL | Status: AC | PRN
  Administered 2023-11-17: 2 mg via INTRAVITREAL

## 2023-11-17 NOTE — Progress Notes (Signed)
[facility] - Clinic Note  11/17/2023   CHIEF COMPLAINT Patient presents for Retina Follow Up  HISTORY OF PRESENT ILLNESS: [name] is a 68 y.o. female who presents to the clinic today for:  HPI     Retina Follow Up   Patient presents with  Wet AMD.  In both eyes.  This started months ago.  Duration of 4 weeks.  Since onset it is stable.  I, the attending physician,  performed the HPI with the patient and updated documentation appropriately.        Comments   Patient states the vision is the same. She states the eyes burn. She is using Latanoprost OU at bedtime - but using OD only. She was in the hospital with pneumonia.       Last edited by Rennis Chris, MD on 11/17/2023  9:30 AM.    Pt states she spent almost the whole month of January in the hospital with double pneumonia and sepsis, she states her vision seems the same  Referring physician: Orpah Cobb, MD [address] [place],  [place] [address]  HISTORICAL INFORMATION:  Selected notes from the MEDICAL RECORD NUMBER Referred by Dr. Bascom Levels for concern of ARMD  LEE:  Ocular Hx- PMH-   CURRENT MEDICATIONS: Current Outpatient Medications (Ophthalmic Drugs)  Medication Sig   latanoprost (XALATAN) 0.005 % ophthalmic solution Place 1 drop into both eyes at bedtime.   No current facility-administered medications for this visit. (Ophthalmic Drugs)   Current Outpatient Medications (Other)  Medication Sig   acetaminophen (TYLENOL) 500 MG tablet Take 500 mg by mouth every 6 (six) hours as needed for mild pain (pain score 1-3).   chlorpheniramine-HYDROcodone (TUSSIONEX) 10-8 MG/5ML Take 5 mLs by mouth every 12 (twelve) hours as needed for cough.   escitalopram (LEXAPRO) 10 MG tablet Take 1 tablet (10 mg total) by mouth daily.   methadone (DOLOPHINE) 10 MG/ML solution Take 20 mLs (200 mg total) by mouth in the morning. Home med.   No current facility-administered medications for this visit. (Other)    REVIEW OF SYSTEMS: ROS   Positive for: Cardiovascular, Eyes Negative for: Constitutional, Gastrointestinal, Neurological, Skin, Genitourinary, Musculoskeletal, HENT, Endocrine, Respiratory, Psychiatric, Allergic/Imm, Heme/Lymph Last edited by Charlette Caffey, COT on 11/17/2023  7:59 AM.       ALLERGIES Allergies  Allergen Reactions   Acetaminophen-Codeine Other (See Comments)    Hand get red, feels like it's on fire   Morphine And Codeine     "makes me feel like i'm on fire"   Nitroglycerin     Causes a headache   Tramadol Hcl     Gives her the shakes   Azithromycin Itching and Rash   PAST MEDICAL HISTORY Past Medical History:  Diagnosis Date   Acute hepatitis C without mention of hepatic coma(070.51)    Anemia    Arthritis    back   Carpal tunnel syndrome    Chest pain, unspecified    Chronic airway obstruction, not elsewhere classified    Chronic hepatitis C without hepatic coma (HCC) 02/09/2018   Colitis    Coronary atherosclerosis of unspecified type of vessel, native or graft    Disorders of bursae and tendons in shoulder region, unspecified    Drug-induced skin rash 04/07/2018   GERD (gastroesophageal reflux disease)    Hardware complicating wound infection (HCC) 02/09/2018   Headache    migraines   Hepatitis    hx of hepatitis C  Infective otitis externa, unspecified    Mixed hyperlipidemia    MRSA infection 02/09/2018   Nonspecific abnormal results of thyroid function study    Other bursitis disorders    Other symptoms involving nervous and musculoskeletal systems(781.99)    Pneumonia    Seizures (HCC)    none for 4 years (as of 01/2018) "pseudo seizures"   Thrush 04/07/2018   Past Surgical History:  Procedure Laterality Date   ABDOMINAL HYSTERECTOMY     CARDIAC CATHETERIZATION     CARDIAC CATHETERIZATION N/A 06/25/2016   Procedure: Left Heart Cath and Coronary Angiography;  Surgeon: Orpah Cobb, MD;  Location: [facility];  Service:  Cardiovascular;  Laterality: N/A;   CESAREAN SECTION     CHOLECYSTECTOMY     COLONOSCOPY     IR FLUORO GUIDED NEEDLE PLC ASPIRATION/INJECTION LOC  12/27/2017   LEFT HEART CATH AND CORONARY ANGIOGRAPHY N/A 06/06/2022   Procedure: LEFT HEART CATH AND CORONARY ANGIOGRAPHY;  Surgeon: Orpah Cobb, MD;  Location: [facility];  Service: Cardiovascular;  Laterality: N/A;   LUMBAR FUSION  01/13/2018   Transforaminal lumbar interbody fusion L4-5 with screws, cages and rods, local bone graft, Allograft, Vivigen, Decompression L4-5 and L5-S1   TONSILLECTOMY     FAMILY HISTORY Family History  Problem Relation Age of Onset   Colonic polyp Mother    Thyroid disease Mother    COPD Mother    Macular degeneration Mother    Uterine cancer Maternal Grandmother    Crohn's disease Daughter 51       colon resection   Irritable bowel syndrome Daughter    Heart murmur Daughter        SVT   Colon cancer Neg Hx    SOCIAL HISTORY Social History   Tobacco Use   Smoking status: Former    Current packs/day: 0.00    Types: Cigarettes    Start date: 06/16/1969    Quit date: 06/16/2022    Years since quitting: 1.4   Smokeless tobacco: Never   Tobacco comments:    2 cigarettes a day  Vaping Use   Vaping status: Former   Quit date: 02/26/2022  Substance Use Topics   Alcohol use: No   Drug use: No       OPHTHALMIC EXAM:  Base Eye Exam     Visual Acuity (Snellen - Linear)       Right Left   Dist [place] 20/150 +1 20/40   Dist ph [place] NI NI         Tonometry (Tonopen, 8:02 AM)       Right Left   Pressure 16 20         Pupils       Dark Light Shape React APD   Right 3 2 Round Brisk None   Left 3 2 Round Brisk None         Visual Fields       Left Right    Full Full         Extraocular Movement       Right Left    Full, Ortho Full, Ortho         Neuro/Psych     Oriented x3: Yes   Mood/Affect: Normal         Dilation     Right eye: 1.0% Mydriacyl @ 7:59 AM            Slit Lamp and Fundus Exam     Slit Lamp Exam  Right Left   Lids/Lashes Dermatochalasis - upper lid Dermatochalasis - upper lid   Conjunctiva/Sclera nasal and temporal pinguecula nasal and temporal pinguecula   Cornea 2+ Punctate epithelial erosions, tear film debris 2+ Punctate epithelial erosions, tear film debris   Anterior Chamber deep and clear deep and clear   Iris Round and dilated Round and dilated   Lens 2+ Nuclear sclerosis, 2+ Cortical cataract 2+ Nuclear sclerosis, 2+ Cortical cataract   Anterior Vitreous mild syneresis mild syneresis, Posterior vitreous detachment         Fundus Exam       Right Left   Disc Pink and Sharp, +cupping Pink and Sharp, PPA   C/D Ratio 0.6 0.6   Macula Blunted foveal reflex, central subretinal fibrosis / scar with surrounding atrophy, +pigment clumping, no heme or edema Blunted foveal reflex, central CNV with pigment ring and edema, shallow SRF -- stably improved, no frank heme, RPE mottling, clumping and atrophy   Vessels attenuated, mild tortuosity attenuated   Periphery Attached, No heme Attached, No heme           IMAGING AND PROCEDURES  Imaging and Procedures for 11/17/2023  OCT, Retina - OU - Both Eyes       Right Eye Quality was good. Central Foveal Thickness: 337. Progression has been stable. Findings include no IRF, no SRF, abnormal foveal contour, retinal drusen , outer retinal atrophy [facility] -- large central disciform scar; no fluid).   Left Eye Quality was good. Central Foveal Thickness: 355. Progression has been stable. Findings include no IRF, no SRF, abnormal foveal contour, retinal drusen , subretinal hyper-reflective material, choroidal neovascular membrane (Central CNV with stable improvement in SRF and edema; persistent SRHM).   Notes *Images captured and stored on drive  Diagnosis / Impression:  Exudative ARMD OU OD: Central SRHM -- large central disciform scar; no fluid OS: Central  CNV with stable improvement in SRF and edema; persistent SRHM  Clinical management:  See below  Abbreviations: NFP - Normal foveal profile. CME - cystoid macular edema. PED - pigment epithelial detachment. IRF - intraretinal fluid. SRF - subretinal fluid. EZ - ellipsoid zone. ERM - epiretinal membrane. ORA - outer retinal atrophy. ORT - outer retinal tubulation. SRHM - subretinal hyper-reflective material. IRHM - intraretinal hyper-reflective material      Intravitreal Injection, Pharmacologic Agent - OS - Left Eye       Time Out 11/17/2023. 8:50 AM. Confirmed correct patient, procedure, site, and patient consented.   Anesthesia Topical anesthesia was used. Anesthetic medications included Lidocaine 2%, Proparacaine 0.5%.   Procedure Preparation included 5% betadine to ocular surface, eyelid speculum. A (32g) needle was used.   Injection: 2 mg aflibercept 2 MG/0.05ML   Route: Intravitreal, Site: Left Eye   NDC: [id number], Lot: [id number], Expiration date: 03/12/2025, Waste: 0 mL   Post-op Post injection exam found visual acuity of at least counting fingers. The patient tolerated the procedure well. There were no complications. The patient received written and verbal post procedure care education.            ASSESSMENT/PLAN:   ICD-10-CM   1. Exudative age-related macular degeneration of both eyes with active choroidal neovascularization (HCC)  H35.3231 OCT, Retina - OU - Both Eyes    Intravitreal Injection, Pharmacologic Agent - OS - Left Eye    aflibercept (EYLEA) SOLN 2 mg    2. Combined forms of age-related cataract of both eyes  H25.813     3. Essential hypertension  I10  4. Hypertensive retinopathy of both eyes  H35.033      Exudative age related macular degeneration, both eyes - OD -- inactive with disciform scar - OS w/ central edema and shallow SRF - s/p IVA OS #1 (09.03.24), #2 (10.01.24), #3 (10.29.24) -- IVA resistance - s/p IVE OS #1 (12.02.24), #2  (12.30.24) - BCVA OD 20/150; OS 20/40 -- stable OU  - OCT shows OD: Central SRHM -- large central disciform scar; no fluid; OS: central CNV with stable improvement in SRF and edema; persistent SRHM at 5 wks  - recommend IVE OS #3 today, 02.03.25 w/ f/u in 6 wks  - pt wishes to proceed with injection  - RBA of procedure discussed, questions answered - IVE informed consent obtained and signed, 12.02.24 (OS) - see procedure note  - f/u in 6 wks, DFE, OCT  2,3. Hypertensive retinopathy OU - discussed importance of tight BP control - monitor   4. Mixed Cataract OU - The symptoms of cataract, surgical options, and treatments and risks were discussed with patient. - discussed diagnosis and progression - monitor  Ophthalmic Meds Ordered this visit:  Meds ordered this encounter  Medications   aflibercept (EYLEA) SOLN 2 mg     Return in about 6 weeks (around 12/29/2023) for f/u exu ARMD OU, DFE, OCT.  There are no Patient Instructions on file for this visit.  Explained the diagnoses, plan, and follow up with the patient and they expressed understanding.  Patient expressed understanding of the importance of proper follow up care.   This document serves as a record of services personally performed by Karie Chimera, MD, PhD. It was created on their behalf by Glee Arvin. Manson Passey, OA an ophthalmic technician. The creation of this record is the provider's dictation and/or activities during the visit.    Electronically signed by: Glee Arvin. Manson Passey, OA 11/17/23 9:32 AM   Karie Chimera, M.D., Ph.D. Diseases & Surgery of the Retina and Vitreous [facility] 11/17/2023  I have reviewed the above documentation for accuracy and completeness, and I agree with the above. Karie Chimera, M.D., Ph.D. 11/17/23 9:32 AM   Abbreviations: M myopia (nearsighted); A astigmatism; H hyperopia (farsighted); P presbyopia; Mrx spectacle prescription;  CTL contact lenses; OD right eye; OS left  eye; OU both eyes  XT exotropia; ET esotropia; PEK punctate epithelial keratitis; PEE punctate epithelial erosions; DES dry eye syndrome; MGD meibomian gland dysfunction; ATs artificial tears; PFAT's preservative free artificial tears; NSC nuclear sclerotic cataract; PSC posterior subcapsular cataract; ERM epi-retinal membrane; PVD posterior vitreous detachment; RD retinal detachment; DM diabetes mellitus; DR diabetic retinopathy; NPDR non-proliferative diabetic retinopathy; PDR proliferative diabetic retinopathy; CSME clinically significant macular edema; DME diabetic macular edema; dbh dot blot hemorrhages; CWS cotton wool spot; POAG primary open angle glaucoma; C/D cup-to-disc ratio; HVF humphrey visual field; GVF goldmann visual field; OCT optical coherence tomography; IOP intraocular pressure; BRVO Branch retinal vein occlusion; CRVO central retinal vein occlusion; CRAO central retinal artery occlusion; BRAO branch retinal artery occlusion; RT retinal tear; SB scleral buckle; PPV pars plana vitrectomy; VH Vitreous hemorrhage; PRP panretinal laser photocoagulation; IVK intravitreal kenalog; VMT vitreomacular traction; MH Macular hole;  NVD neovascularization of the disc; NVE neovascularization elsewhere; AREDS age related eye disease study; ARMD age related macular degeneration; POAG primary open angle glaucoma; EBMD epithelial/anterior basement membrane dystrophy; ACIOL anterior chamber intraocular lens; IOL intraocular lens; PCIOL posterior chamber intraocular lens; Phaco/IOL phacoemulsification with intraocular lens placement; PRK photorefractive keratectomy; LASIK laser  assisted in situ keratomileusis; HTN hypertension; DM diabetes mellitus; COPD chronic obstructive pulmonary disease

## 2023-11-21 DIAGNOSIS — F1120 Opioid dependence, uncomplicated: Secondary | ICD-10-CM | POA: Diagnosis not present

## 2023-11-28 DIAGNOSIS — F1120 Opioid dependence, uncomplicated: Secondary | ICD-10-CM | POA: Diagnosis not present

## 2023-12-05 DIAGNOSIS — F1120 Opioid dependence, uncomplicated: Secondary | ICD-10-CM | POA: Diagnosis not present

## 2023-12-08 DIAGNOSIS — R072 Precordial pain: Secondary | ICD-10-CM | POA: Diagnosis not present

## 2023-12-08 DIAGNOSIS — I7389 Other specified peripheral vascular diseases: Secondary | ICD-10-CM | POA: Diagnosis not present

## 2023-12-08 DIAGNOSIS — J449 Chronic obstructive pulmonary disease, unspecified: Secondary | ICD-10-CM | POA: Diagnosis not present

## 2023-12-08 DIAGNOSIS — R0602 Shortness of breath: Secondary | ICD-10-CM | POA: Diagnosis not present

## 2023-12-17 NOTE — Progress Notes (Signed)
 [facility] - Clinic Note  12/29/2023   CHIEF COMPLAINT Patient presents for Retina Follow Up  HISTORY OF PRESENT ILLNESS: [name] is a 68 y.o. female who presents to the clinic today for:  HPI     Retina Follow Up   Patient presents with  Wet AMD.  In both eyes.  Severity is moderate.  Duration of 6 weeks.  Since onset it is stable.  I, the attending physician,  performed the HPI with the patient and updated documentation appropriately.        Comments   6 week Retina eval. Patient states vision is the same. Patient broke glasses      Last edited by Rennis Chris, MD on 12/29/2023 10:15 AM.    Pt states vision is the same   Referring physician: Orpah Cobb, MD [address] [place],  [place] [address]  HISTORICAL INFORMATION:  Selected notes from the MEDICAL RECORD NUMBER Referred by Dr. Bascom Levels for concern of ARMD  LEE:  Ocular Hx- PMH-   CURRENT MEDICATIONS: Current Outpatient Medications (Ophthalmic Drugs)  Medication Sig   latanoprost (XALATAN) 0.005 % ophthalmic solution Place 1 drop into both eyes at bedtime.   No current facility-administered medications for this visit. (Ophthalmic Drugs)   Current Outpatient Medications (Other)  Medication Sig   acetaminophen (TYLENOL) 500 MG tablet Take 500 mg by mouth every 6 (six) hours as needed for mild pain (pain score 1-3).   chlorpheniramine-HYDROcodone (TUSSIONEX) 10-8 MG/5ML Take 5 mLs by mouth every 12 (twelve) hours as needed for cough.   methadone (DOLOPHINE) 10 MG/ML solution Take 20 mLs (200 mg total) by mouth in the morning. Home med.   escitalopram (LEXAPRO) 10 MG tablet Take 1 tablet (10 mg total) by mouth daily.   No current facility-administered medications for this visit. (Other)   REVIEW OF SYSTEMS: ROS   Positive for: Cardiovascular, Eyes Negative for: Constitutional, Gastrointestinal, Neurological, Skin, Genitourinary, Musculoskeletal, HENT, Endocrine, Respiratory,  Psychiatric, Allergic/Imm, Heme/Lymph Last edited by Lana Fish, COT on 12/29/2023  8:06 AM.     ALLERGIES Allergies  Allergen Reactions   Acetaminophen-Codeine Other (See Comments)    Hand get red, feels like it's on fire   Morphine And Codeine     "makes me feel like i'm on fire"   Nitroglycerin     Causes a headache   Tramadol Hcl     Gives her the shakes   Azithromycin Itching and Rash   PAST MEDICAL HISTORY Past Medical History:  Diagnosis Date   Acute hepatitis C without mention of hepatic coma(070.51)    Anemia    Arthritis    back   Carpal tunnel syndrome    Chest pain, unspecified    Chronic airway obstruction, not elsewhere classified    Chronic hepatitis C without hepatic coma (HCC) 02/09/2018   Colitis    Coronary atherosclerosis of unspecified type of vessel, native or graft    Disorders of bursae and tendons in shoulder region, unspecified    Drug-induced skin rash 04/07/2018   GERD (gastroesophageal reflux disease)    Hardware complicating wound infection (HCC) 02/09/2018   Headache    migraines   Hepatitis    hx of hepatitis C   Infective otitis externa, unspecified    Mixed hyperlipidemia    MRSA infection 02/09/2018   Nonspecific abnormal results of thyroid function study    Other bursitis disorders    Other symptoms involving nervous and musculoskeletal systems(781.99)  Pneumonia    Seizures (HCC)    none for 4 years (as of 01/2018) "pseudo seizures"   Thrush 04/07/2018   Past Surgical History:  Procedure Laterality Date   ABDOMINAL HYSTERECTOMY     CARDIAC CATHETERIZATION     CARDIAC CATHETERIZATION N/A 06/25/2016   Procedure: Left Heart Cath and Coronary Angiography;  Surgeon: Orpah Cobb, MD;  Location: [facility];  Service: Cardiovascular;  Laterality: N/A;   CESAREAN SECTION     CHOLECYSTECTOMY     COLONOSCOPY     IR FLUORO GUIDED NEEDLE PLC ASPIRATION/INJECTION LOC  12/27/2017   LEFT HEART CATH AND CORONARY ANGIOGRAPHY N/A  06/06/2022   Procedure: LEFT HEART CATH AND CORONARY ANGIOGRAPHY;  Surgeon: Orpah Cobb, MD;  Location: [facility];  Service: Cardiovascular;  Laterality: N/A;   LUMBAR FUSION  01/13/2018   Transforaminal lumbar interbody fusion L4-5 with screws, cages and rods, local bone graft, Allograft, Vivigen, Decompression L4-5 and L5-S1   TONSILLECTOMY     FAMILY HISTORY Family History  Problem Relation Age of Onset   Colonic polyp Mother    Thyroid disease Mother    COPD Mother    Macular degeneration Mother    Uterine cancer Maternal Grandmother    Crohn's disease Daughter 47       colon resection   Irritable bowel syndrome Daughter    Heart murmur Daughter        SVT   Colon cancer Neg Hx    SOCIAL HISTORY Social History   Tobacco Use   Smoking status: Former    Current packs/day: 0.00    Types: Cigarettes    Start date: 06/16/1969    Quit date: 06/16/2022    Years since quitting: 1.5   Smokeless tobacco: Never   Tobacco comments:    2 cigarettes a day  Vaping Use   Vaping status: Former   Quit date: 02/26/2022  Substance Use Topics   Alcohol use: No   Drug use: No       OPHTHALMIC EXAM:  Base Eye Exam     Visual Acuity (Snellen - Linear)       Right Left   Dist [place] 20/400 20/50 -2   Dist ph [place] 20/150 -2 20/NI         Tonometry (Tonopen, 8:08 AM)       Right Left   Pressure 16 15         Pupils       Dark Light Shape React APD   Right 3 2 Round Brisk None   Left 3 2 Round Brisk None         Visual Fields (Counting fingers)       Left Right    Full Full         Extraocular Movement       Right Left    Full, Ortho Full, Ortho         Neuro/Psych     Oriented x3: Yes   Mood/Affect: Normal         Dilation     Both eyes: 1.0% Mydriacyl, 2.5% Phenylephrine @ 8:08 AM           Slit Lamp and Fundus Exam     Slit Lamp Exam       Right Left   Lids/Lashes Dermatochalasis - upper lid Dermatochalasis - upper lid    Conjunctiva/Sclera nasal and temporal pinguecula nasal and temporal pinguecula   Cornea 2+ Punctate epithelial erosions, tear  film debris 2+ Punctate epithelial erosions, tear film debris   Anterior Chamber deep and clear deep and clear   Iris Round and dilated Round and dilated   Lens 2+ Nuclear sclerosis, 2+ Cortical cataract 2+ Nuclear sclerosis, 2+ Cortical cataract   Anterior Vitreous mild syneresis mild syneresis, Posterior vitreous detachment         Fundus Exam       Right Left   Disc Pink and Sharp, +cupping Pink and Sharp, PPA   C/D Ratio 0.7 0.3   Macula Blunted foveal reflex, central subretinal fibrosis / scar with surrounding atrophy, +pigment clumping, no heme or edema Blunted foveal reflex, central CNV with pigment ring and edema, shallow SRF -- stably improved, no frank heme, RPE mottling, clumping and atrophy   Vessels attenuated, Tortuous attenuated, mild tortuosity   Periphery Attached, No heme Attached, No heme           IMAGING AND PROCEDURES  Imaging and Procedures for 12/29/2023  OCT, Retina - OU - Both Eyes       Right Eye Quality was good. Central Foveal Thickness: 262. Progression has been stable. Findings include no IRF, no SRF, abnormal foveal contour, retinal drusen , outer retinal atrophy [facility] -- large central disciform scar; no fluid).   Left Eye Quality was good. Central Foveal Thickness: 306. Progression has been stable. Findings include no IRF, no SRF, abnormal foveal contour, retinal drusen , subretinal hyper-reflective material, choroidal neovascular membrane (Central CNV with stable improvement in SRF and edema; persistent SRHM).   Notes *Images captured and stored on drive  Diagnosis / Impression:  Exudative ARMD OU OD: Central SRHM -- large central disciform scar; no fluid OS: Central CNV with stable improvement in SRF and edema; persistent SRHM  Clinical management:  See below  Abbreviations: NFP - Normal foveal profile.  CME - cystoid macular edema. PED - pigment epithelial detachment. IRF - intraretinal fluid. SRF - subretinal fluid. EZ - ellipsoid zone. ERM - epiretinal membrane. ORA - outer retinal atrophy. ORT - outer retinal tubulation. SRHM - subretinal hyper-reflective material. IRHM - intraretinal hyper-reflective material      Intravitreal Injection, Pharmacologic Agent - OS - Left Eye       Time Out 12/29/2023. 9:02 AM. Confirmed correct patient, procedure, site, and patient consented.   Anesthesia Topical anesthesia was used. Anesthetic medications included Lidocaine 2%, Proparacaine 0.5%.   Procedure Preparation included 5% betadine to ocular surface, eyelid speculum. A (32g) needle was used.   Injection: 2 mg aflibercept 2 MG/0.05ML   Route: Intravitreal, Site: Left Eye   NDC: [id number], Lot: [id number], Expiration date: 03/13/2025, Waste: 0 mL   Post-op Post injection exam found visual acuity of at least counting fingers. The patient tolerated the procedure well. There were no complications. The patient received written and verbal post procedure care education.           ASSESSMENT/PLAN:   ICD-10-CM   1. Exudative age-related macular degeneration of both eyes with active choroidal neovascularization (HCC)  H35.3231 OCT, Retina - OU - Both Eyes    Intravitreal Injection, Pharmacologic Agent - OS - Left Eye    aflibercept (EYLEA) SOLN 2 mg    2. Combined forms of age-related cataract of both eyes  H25.813     3. Essential hypertension  I10     4. Hypertensive retinopathy of both eyes  H35.033      Exudative age related macular degeneration, both eyes - OD -- inactive with disciform scar -  OS w/ central edema and shallow SRF - s/p IVA OS #1 (09.03.24), #2 (10.01.24), #3 (10.29.24) -- IVA resistance - s/p IVE OS #1 (12.02.24), #2 (12.30.24), #3 (02.03.25) - BCVA OD 20/150; OS 20/50 from 20/40  - OCT shows OD: Central SRHM -- large central disciform scar; no fluid; OS:  central CNV with stable improvement in SRF and edema; persistent SRHM at 6 wks  - recommend IVE OS #4 today, 03.17.25 w/ f/u extended to 7 wks  - pt wishes to proceed with injection  - RBA of procedure discussed, questions answered - IVE informed consent obtained and signed, 12.02.24 (OS) - see procedure note  - f/u in 7 wks, DFE, OCT  2,3. Hypertensive retinopathy OU - discussed importance of tight BP control - monitor   4. Mixed Cataract OU - The symptoms of cataract, surgical options, and treatments and risks were discussed with patient. - discussed diagnosis and progression - monitor  Ophthalmic Meds Ordered this visit:  Meds ordered this encounter  Medications   aflibercept (EYLEA) SOLN 2 mg     Return in about 7 weeks (around 02/16/2024) for f/u exu ARMD OU, DFE, OCT.  There are no Patient Instructions on file for this visit.  Explained the diagnoses, plan, and follow up with the patient and they expressed understanding.  Patient expressed understanding of the importance of proper follow up care.   This document serves as a record of services personally performed by Karie Chimera, MD, PhD. It was created on their behalf by Glee Arvin. Manson Passey, OA an ophthalmic technician. The creation of this record is the provider's dictation and/or activities during the visit.    Electronically signed by: Glee Arvin. Manson Passey, OA 01/01/24 10:25 PM  Karie Chimera, M.D., Ph.D. Diseases & Surgery of the Retina and Vitreous [facility] 12/29/2023  I have reviewed the above documentation for accuracy and completeness, and I agree with the above. Karie Chimera, M.D., Ph.D. 01/01/24 10:35 PM   Abbreviations: M myopia (nearsighted); A astigmatism; H hyperopia (farsighted); P presbyopia; Mrx spectacle prescription;  CTL contact lenses; OD right eye; OS left eye; OU both eyes  XT exotropia; ET esotropia; PEK punctate epithelial keratitis; PEE punctate epithelial erosions; DES  dry eye syndrome; MGD meibomian gland dysfunction; ATs artificial tears; PFAT's preservative free artificial tears; NSC nuclear sclerotic cataract; PSC posterior subcapsular cataract; ERM epi-retinal membrane; PVD posterior vitreous detachment; RD retinal detachment; DM diabetes mellitus; DR diabetic retinopathy; NPDR non-proliferative diabetic retinopathy; PDR proliferative diabetic retinopathy; CSME clinically significant macular edema; DME diabetic macular edema; dbh dot blot hemorrhages; CWS cotton wool spot; POAG primary open angle glaucoma; C/D cup-to-disc ratio; HVF humphrey visual field; GVF goldmann visual field; OCT optical coherence tomography; IOP intraocular pressure; BRVO Branch retinal vein occlusion; CRVO central retinal vein occlusion; CRAO central retinal artery occlusion; BRAO branch retinal artery occlusion; RT retinal tear; SB scleral buckle; PPV pars plana vitrectomy; VH Vitreous hemorrhage; PRP panretinal laser photocoagulation; IVK intravitreal kenalog; VMT vitreomacular traction; MH Macular hole;  NVD neovascularization of the disc; NVE neovascularization elsewhere; AREDS age related eye disease study; ARMD age related macular degeneration; POAG primary open angle glaucoma; EBMD epithelial/anterior basement membrane dystrophy; ACIOL anterior chamber intraocular lens; IOL intraocular lens; PCIOL posterior chamber intraocular lens; Phaco/IOL phacoemulsification with intraocular lens placement; PRK photorefractive keratectomy; LASIK laser assisted in situ keratomileusis; HTN hypertension; DM diabetes mellitus; COPD chronic obstructive pulmonary disease

## 2023-12-19 DIAGNOSIS — F1120 Opioid dependence, uncomplicated: Secondary | ICD-10-CM | POA: Diagnosis not present

## 2023-12-29 ENCOUNTER — Encounter (INDEPENDENT_AMBULATORY_CARE_PROVIDER_SITE_OTHER): Payer: Self-pay | Admitting: Ophthalmology

## 2023-12-29 ENCOUNTER — Ambulatory Visit (INDEPENDENT_AMBULATORY_CARE_PROVIDER_SITE_OTHER): Payer: Medicare HMO | Admitting: Ophthalmology

## 2023-12-29 DIAGNOSIS — H353231 Exudative age-related macular degeneration, bilateral, with active choroidal neovascularization: Secondary | ICD-10-CM | POA: Diagnosis not present

## 2023-12-29 DIAGNOSIS — I10 Essential (primary) hypertension: Secondary | ICD-10-CM | POA: Diagnosis not present

## 2023-12-29 DIAGNOSIS — H25813 Combined forms of age-related cataract, bilateral: Secondary | ICD-10-CM | POA: Diagnosis not present

## 2023-12-29 DIAGNOSIS — H35033 Hypertensive retinopathy, bilateral: Secondary | ICD-10-CM | POA: Diagnosis not present

## 2023-12-29 MED ORDER — AFLIBERCEPT 2MG/0.05ML IZ SOLN FOR KALEIDOSCOPE
2.0000 mg | INTRAVITREAL | Status: AC | PRN
  Administered 2023-12-29: 2 mg via INTRAVITREAL

## 2024-01-02 DIAGNOSIS — F1120 Opioid dependence, uncomplicated: Secondary | ICD-10-CM | POA: Diagnosis not present

## 2024-01-05 DIAGNOSIS — I7389 Other specified peripheral vascular diseases: Secondary | ICD-10-CM | POA: Diagnosis not present

## 2024-01-05 DIAGNOSIS — R072 Precordial pain: Secondary | ICD-10-CM | POA: Diagnosis not present

## 2024-01-05 DIAGNOSIS — R0602 Shortness of breath: Secondary | ICD-10-CM | POA: Diagnosis not present

## 2024-01-05 DIAGNOSIS — J449 Chronic obstructive pulmonary disease, unspecified: Secondary | ICD-10-CM | POA: Diagnosis not present

## 2024-01-16 DIAGNOSIS — F1120 Opioid dependence, uncomplicated: Secondary | ICD-10-CM | POA: Diagnosis not present

## 2024-01-30 DIAGNOSIS — F1120 Opioid dependence, uncomplicated: Secondary | ICD-10-CM | POA: Diagnosis not present

## 2024-02-04 DIAGNOSIS — R0602 Shortness of breath: Secondary | ICD-10-CM | POA: Diagnosis not present

## 2024-02-04 DIAGNOSIS — J449 Chronic obstructive pulmonary disease, unspecified: Secondary | ICD-10-CM | POA: Diagnosis not present

## 2024-02-04 DIAGNOSIS — I7389 Other specified peripheral vascular diseases: Secondary | ICD-10-CM | POA: Diagnosis not present

## 2024-02-04 DIAGNOSIS — R072 Precordial pain: Secondary | ICD-10-CM | POA: Diagnosis not present

## 2024-02-05 ENCOUNTER — Other Ambulatory Visit: Payer: Self-pay | Admitting: Cardiovascular Disease

## 2024-02-05 ENCOUNTER — Ambulatory Visit
Admission: RE | Admit: 2024-02-05 | Discharge: 2024-02-05 | Disposition: A | Discharge status: Home / Self Care | Source: Ambulatory Visit | Attending: Cardiovascular Disease | Admitting: Cardiovascular Disease

## 2024-02-05 DIAGNOSIS — R0789 Other chest pain: Secondary | ICD-10-CM

## 2024-02-05 NOTE — Progress Notes (Addendum)
 [facility] - Clinic Note  02/16/2024   CHIEF COMPLAINT Patient presents for Retina Follow Up  HISTORY OF PRESENT ILLNESS: [name] is a 68 y.o. female who presents to the clinic today for:  HPI     Retina Follow Up   Patient presents with  Wet AMD.  In both eyes.  This started 9 months ago.  Severity is moderate.  Duration of 7 weeks.  Since onset it is stable.  I, the attending physician,  performed the HPI with the patient and updated documentation appropriately.        Comments   Pt presents for 7 week retina eval., exu ARMD-OU. Patient states vision she hasn't noticed any changes in her vision. Pt denies any FOL/floaters/discomfort. Pt is using ATS bid. Pt states about 2 weeks ago her eye was burning for 3-4 days so she used drops more but she is unsure what caused it. Pt states she stopped taking latanoprost  a couple of weeks ago because it was burning her eye and she has been meaning to call Dr. Ruthanna Covert office to see if she needs to take it any longer.      Last edited by Ronelle Coffee, MD on 02/16/2024 12:03 PM.    Pt states vision is the same   Referring physician: Pasqual Bone, MD [address] [place],  [place] [address]  HISTORICAL INFORMATION:  Selected notes from the MEDICAL RECORD NUMBER Referred by Dr. Micael Adas for concern of ARMD  LEE:  Ocular Hx- PMH-   CURRENT MEDICATIONS: Current Outpatient Medications (Ophthalmic Drugs)  Medication Sig   latanoprost  (XALATAN ) 0.005 % ophthalmic solution Place 1 drop into both eyes at bedtime. (Patient not taking: Reported on 02/16/2024)   No current facility-administered medications for this visit. (Ophthalmic Drugs)   Current Outpatient Medications (Other)  Medication Sig   acetaminophen  (TYLENOL ) 500 MG tablet Take 500 mg by mouth every 6 (six) hours as needed for mild pain (pain score 1-3).   albuterol  (VENTOLIN  HFA) 108 (90 Base) MCG/ACT inhaler SMARTSIG:2 Puff(s) By Mouth Every 4 Hours    chlorpheniramine-HYDROcodone  (TUSSIONEX) 10-8 MG/5ML Take 5 mLs by mouth every 12 (twelve) hours as needed for cough.   methadone  (DOLOPHINE ) 10 MG/ML solution Take 20 mLs (200 mg total) by mouth in the morning. Home med.   metoprolol  tartrate (LOPRESSOR ) 25 MG tablet Take 25 mg by mouth 2 (two) times daily.   tiZANidine (ZANAFLEX) 2 MG tablet Take 2 mg by mouth at bedtime.   escitalopram  (LEXAPRO ) 10 MG tablet Take 1 tablet (10 mg total) by mouth daily.   No current facility-administered medications for this visit. (Other)   REVIEW OF SYSTEMS: ROS   Positive for: Cardiovascular, Eyes Negative for: Constitutional, Gastrointestinal, Neurological, Skin, Genitourinary, Musculoskeletal, HENT, Endocrine, Respiratory, Psychiatric, Allergic/Imm, Heme/Lymph Last edited by Carrington Clack, COT on 02/16/2024  7:53 AM.      ALLERGIES Allergies  Allergen Reactions   Acetaminophen -Codeine Other (See Comments)    Hand get red, feels like it's on fire   Morphine  And Codeine     "makes me feel like i'm on fire"   Nitroglycerin      Causes a headache   Tramadol  Hcl     Gives her the shakes   Azithromycin Itching and Rash   PAST MEDICAL HISTORY Past Medical History:  Diagnosis Date   Acute hepatitis C without mention of hepatic coma(070.51)    Anemia    Arthritis    back  Carpal tunnel syndrome    Chest pain, unspecified    Chronic airway obstruction, not elsewhere classified    Chronic hepatitis C without hepatic coma (HCC) 02/09/2018   Colitis    Coronary atherosclerosis of unspecified type of vessel, native or graft    Disorders of bursae and tendons in shoulder region, unspecified    Drug-induced skin rash 04/07/2018   GERD (gastroesophageal reflux disease)    Hardware complicating wound infection (HCC) 02/09/2018   Headache    migraines   Hepatitis    hx of hepatitis C   Infective otitis externa, unspecified    Mixed hyperlipidemia    MRSA infection 02/09/2018   Nonspecific  abnormal results of thyroid function study    Other bursitis disorders    Other symptoms involving nervous and musculoskeletal systems(781.99)    Pneumonia    Seizures (HCC)    none for 4 years (as of 01/2018) "pseudo seizures"   Thrush 04/07/2018   Past Surgical History:  Procedure Laterality Date   ABDOMINAL HYSTERECTOMY     CARDIAC CATHETERIZATION     CARDIAC CATHETERIZATION N/A 06/25/2016   Procedure: Left Heart Cath and Coronary Angiography;  Surgeon: Pasqual Bone, MD;  Location: [facility];  Service: Cardiovascular;  Laterality: N/A;   CESAREAN SECTION     CHOLECYSTECTOMY     COLONOSCOPY     IR FLUORO GUIDED NEEDLE PLC ASPIRATION/INJECTION LOC  12/27/2017   LEFT HEART CATH AND CORONARY ANGIOGRAPHY N/A 06/06/2022   Procedure: LEFT HEART CATH AND CORONARY ANGIOGRAPHY;  Surgeon: Pasqual Bone, MD;  Location: [facility];  Service: Cardiovascular;  Laterality: N/A;   LUMBAR FUSION  01/13/2018   Transforaminal lumbar interbody fusion L4-5 with screws, cages and rods, local bone graft, Allograft, Vivigen, Decompression L4-5 and L5-S1   TONSILLECTOMY     FAMILY HISTORY Family History  Problem Relation Age of Onset   Colonic polyp Mother    Thyroid disease Mother    COPD Mother    Macular degeneration Mother    Uterine cancer Maternal Grandmother    Crohn's disease Daughter 83       colon resection   Irritable bowel syndrome Daughter    Heart murmur Daughter        SVT   Colon cancer Neg Hx    SOCIAL HISTORY Social History   Tobacco Use   Smoking status: Former    Current packs/day: 0.00    Types: Cigarettes    Start date: 06/16/1969    Quit date: 06/16/2022    Years since quitting: 1.6   Smokeless tobacco: Never   Tobacco comments:    2 cigarettes a day  Vaping Use   Vaping status: Former   Quit date: 02/26/2022  Substance Use Topics   Alcohol use: No   Drug use: No       OPHTHALMIC EXAM:  Base Eye Exam     Visual Acuity (Snellen - Linear)        Right Left   Dist cc 20/250 +2 20/60 -2   Dist ph cc NI NI         Tonometry (Tonopen, 8:02 AM)       Right Left   Pressure 22 17         Pupils       Pupils Dark Light Shape React APD   Right PERRL 3 2 Round Brisk None   Left PERRL 3 2 Round Brisk None  Visual Fields       Left Right    Full Full         Extraocular Movement       Right Left    Full, Ortho Full, Ortho         Neuro/Psych     Oriented x3: Yes   Mood/Affect: Normal         Dilation     Both eyes: 1.0% Mydriacyl, 2.5% Phenylephrine  @ 8:03 AM           Slit Lamp and Fundus Exam     Slit Lamp Exam       Right Left   Lids/Lashes Dermatochalasis - upper lid Dermatochalasis - upper lid   Conjunctiva/Sclera nasal and temporal pinguecula nasal and temporal pinguecula   Cornea 2+ Punctate epithelial erosions, tear film debris 2+ Punctate epithelial erosions, tear film debris   Anterior Chamber deep and clear deep and clear   Iris Round and dilated Round and dilated   Lens 2+ Nuclear sclerosis, 2+ Cortical cataract 2+ Nuclear sclerosis, 2+ Cortical cataract   Anterior Vitreous mild syneresis mild syneresis, Posterior vitreous detachment         Fundus Exam       Right Left   Disc Pink and Sharp, +cupping Pink and Sharp, PPA   C/D Ratio 0.7 0.3   Macula Blunted foveal reflex, central subretinal fibrosis / scar with surrounding atrophy, +pigment clumping, no heme or edema Blunted foveal reflex, central CNV with pigment ring and edema, shallow SRF -- stably improved, no frank heme, RPE mottling, clumping and atrophy   Vessels attenuated, Tortuous attenuated, mild tortuosity   Periphery Attached, No heme Attached, No heme           Refraction     Wearing Rx       Sphere Cylinder Axis   Right -2.75 +1.50 121   Left -2.75 +1.50 120    Age: >1   Type: SVL           IMAGING AND PROCEDURES  Imaging and Procedures for 02/16/2024  OCT, Retina - OU - Both Eyes        Right Eye Quality was good. Central Foveal Thickness: 258. Progression has been stable. Findings include no IRF, no SRF, abnormal foveal contour, retinal drusen , outer retinal atrophy [facility] -- large central disciform scar; no fluid).   Left Eye Quality was good. Central Foveal Thickness: 309. Progression has been stable. Findings include no IRF, no SRF, abnormal foveal contour, retinal drusen , subretinal hyper-reflective material, choroidal neovascular membrane (Central CNV with stable improvement in SRF and edema; persistent SRHM).   Notes *Images captured and stored on drive  Diagnosis / Impression:  Exudative ARMD OU OD: Central SRHM -- large central disciform scar; no fluid OS: Central CNV with stable improvement in SRF and edema; persistent SRHM  Clinical management:  See below  Abbreviations: NFP - Normal foveal profile. CME - cystoid macular edema. PED - pigment epithelial detachment. IRF - intraretinal fluid. SRF - subretinal fluid. EZ - ellipsoid zone. ERM - epiretinal membrane. ORA - outer retinal atrophy. ORT - outer retinal tubulation. SRHM - subretinal hyper-reflective material. IRHM - intraretinal hyper-reflective material      Intravitreal Injection, Pharmacologic Agent - OS - Left Eye       Time Out 02/16/2024. 9:15 AM. Confirmed correct patient, procedure, site, and patient consented.   Anesthesia Topical anesthesia was used. Anesthetic medications included Lidocaine  2%, Proparacaine 0.5%.  Procedure Preparation included 5% betadine to ocular surface, eyelid speculum. A (32g) needle was used.   Injection: 2 mg aflibercept  2 MG/0.05ML   Route: Intravitreal, Site: Left Eye   NDC: [id number], Lot: [id number], Expiration date: 02/09/2025, Waste: 0 mL   Post-op Post injection exam found visual acuity of at least counting fingers. The patient tolerated the procedure well. There were no complications. The patient received written and verbal post  procedure care education.           ASSESSMENT/PLAN:   ICD-10-CM   1. Exudative age-related macular degeneration of both eyes with active choroidal neovascularization (HCC)  H35.3231 OCT, Retina - OU - Both Eyes    Intravitreal Injection, Pharmacologic Agent - OS - Left Eye    aflibercept  (EYLEA ) SOLN 2 mg    2. Essential hypertension  I10     3. Hypertensive retinopathy of both eyes  H35.033     4. Combined forms of age-related cataract of both eyes  H25.813      Exudative age related macular degeneration, both eyes - OD -- inactive with disciform scar - OS w/ central edema and shallow SRF - s/p IVA OS #1 (09.03.24), #2 (10.01.24), #3 (10.29.24) -- IVA resistance - s/p IVE OS #1 (12.02.24), #2 (12.30.24), #3 (02.03.25), #4 (03.17.25) - BCVA OD 20/250 (down from 20/150); OS 20/60-2 (down from 20/50)  - OCT shows OD: Central SRHM -- large central disciform scar; no fluid; OS: central CNV with stable improvement in SRF and edema; persistent SRHM at 7 wks  - recommend IVE OS #5 today, 05.05.25 w/ f/u in 6-7 wks  - pt wishes to proceed with injection  - RBA of procedure discussed, questions answered - IVE informed consent obtained and signed, 12.02.24 (OS) - see procedure note - f/u in 6-7 wks, DFE, OCT, possible injxn   2,3. Hypertensive retinopathy OU - discussed importance of tight BP control - monitor   4. Mixed Cataract OU - The symptoms of cataract, surgical options, and treatments and risks were discussed with patient. - discussed diagnosis and progression - monitor  Ophthalmic Meds Ordered this visit:  Meds ordered this encounter  Medications   aflibercept  (EYLEA ) SOLN 2 mg     Return for 6-7 wks - ex ARMD OS - DFE, OCT, Possible Injxn.  There are no Patient Instructions on file for this visit.  Explained the diagnoses, plan, and follow up with the patient and they expressed understanding.  Patient expressed understanding of the importance of proper follow up  care.   This document serves as a record of services personally performed by Jeanice Millard, MD, PhD. It was created on their behalf by Morley Arabia. Bevin Bucks, OA an ophthalmic technician. The creation of this record is the provider's dictation and/or activities during the visit.    Electronically signed by: Morley Arabia. Bevin Bucks, OA 02/16/24 12:07 PM  This document serves as a record of services personally performed by Jeanice Millard, MD, PhD. It was created on their behalf by Diona Franklin, COMT. The creation of this record is the provider's dictation and/or activities during the visit.  Electronically signed by: Diona Franklin, COMT 02/16/24 12:07 PM  Jeanice Millard, M.D., Ph.D. Diseases & Surgery of the Retina and Vitreous [facility] 02/16/2024  I have reviewed the above documentation for accuracy and completeness, and I agree with the above. Jeanice Millard, M.D., Ph.D. 02/16/24 12:07 PM   Abbreviations: M myopia (nearsighted); A astigmatism; H hyperopia (farsighted);  P presbyopia; Mrx spectacle prescription;  CTL contact lenses; OD right eye; OS left eye; OU both eyes  XT exotropia; ET esotropia; PEK punctate epithelial keratitis; PEE punctate epithelial erosions; DES dry eye syndrome; MGD meibomian gland dysfunction; ATs artificial tears; PFAT's preservative free artificial tears; NSC nuclear sclerotic cataract; PSC posterior subcapsular cataract; ERM epi-retinal membrane; PVD posterior vitreous detachment; RD retinal detachment; DM diabetes mellitus; DR diabetic retinopathy; NPDR non-proliferative diabetic retinopathy; PDR proliferative diabetic retinopathy; CSME clinically significant macular edema; DME diabetic macular edema; dbh dot blot hemorrhages; CWS cotton wool spot; POAG primary open angle glaucoma; C/D cup-to-disc ratio; HVF humphrey visual field; GVF goldmann visual field; OCT optical coherence tomography; IOP intraocular pressure; BRVO Branch retinal vein occlusion;  CRVO central retinal vein occlusion; CRAO central retinal artery occlusion; BRAO branch retinal artery occlusion; RT retinal tear; SB scleral buckle; PPV pars plana vitrectomy; VH Vitreous hemorrhage; PRP panretinal laser photocoagulation; IVK intravitreal kenalog; VMT vitreomacular traction; MH Macular hole;  NVD neovascularization of the disc; NVE neovascularization elsewhere; AREDS age related eye disease study; ARMD age related macular degeneration; POAG primary open angle glaucoma; EBMD epithelial/anterior basement membrane dystrophy; ACIOL anterior chamber intraocular lens; IOL intraocular lens; PCIOL posterior chamber intraocular lens; Phaco/IOL phacoemulsification with intraocular lens placement; PRK photorefractive keratectomy; LASIK laser assisted in situ keratomileusis; HTN hypertension; DM diabetes mellitus; COPD chronic obstructive pulmonary disease

## 2024-02-09 ENCOUNTER — Ambulatory Visit
Admission: RE | Admit: 2024-02-09 | Discharge: 2024-02-09 | Disposition: A | Discharge status: Home / Self Care | Source: Ambulatory Visit | Attending: Cardiovascular Disease | Admitting: Cardiovascular Disease

## 2024-02-09 DIAGNOSIS — R059 Cough, unspecified: Secondary | ICD-10-CM | POA: Diagnosis not present

## 2024-02-09 DIAGNOSIS — R079 Chest pain, unspecified: Secondary | ICD-10-CM | POA: Diagnosis not present

## 2024-02-09 DIAGNOSIS — R0602 Shortness of breath: Secondary | ICD-10-CM | POA: Diagnosis not present

## 2024-02-13 DIAGNOSIS — F1120 Opioid dependence, uncomplicated: Secondary | ICD-10-CM | POA: Diagnosis not present

## 2024-02-16 ENCOUNTER — Encounter (INDEPENDENT_AMBULATORY_CARE_PROVIDER_SITE_OTHER): Payer: Self-pay | Admitting: Ophthalmology

## 2024-02-16 ENCOUNTER — Ambulatory Visit (INDEPENDENT_AMBULATORY_CARE_PROVIDER_SITE_OTHER): Admitting: Ophthalmology

## 2024-02-16 DIAGNOSIS — H35033 Hypertensive retinopathy, bilateral: Secondary | ICD-10-CM

## 2024-02-16 DIAGNOSIS — H25813 Combined forms of age-related cataract, bilateral: Secondary | ICD-10-CM

## 2024-02-16 DIAGNOSIS — H353231 Exudative age-related macular degeneration, bilateral, with active choroidal neovascularization: Secondary | ICD-10-CM

## 2024-02-16 DIAGNOSIS — I10 Essential (primary) hypertension: Secondary | ICD-10-CM

## 2024-02-16 MED ORDER — AFLIBERCEPT 2MG/0.05ML IZ SOLN FOR KALEIDOSCOPE
2.0000 mg | INTRAVITREAL | Status: AC | PRN
  Administered 2024-02-16: 2 mg via INTRAVITREAL

## 2024-02-27 DIAGNOSIS — F1120 Opioid dependence, uncomplicated: Secondary | ICD-10-CM | POA: Diagnosis not present

## 2024-03-02 ENCOUNTER — Emergency Department (HOSPITAL_COMMUNITY)

## 2024-03-02 ENCOUNTER — Emergency Department (HOSPITAL_COMMUNITY)
Admission: EM | Admit: 2024-03-02 | Discharge: 2024-03-02 | Disposition: A | Discharge status: Home / Self Care | Attending: Emergency Medicine | Admitting: Emergency Medicine

## 2024-03-02 ENCOUNTER — Other Ambulatory Visit: Payer: Self-pay

## 2024-03-02 DIAGNOSIS — I6782 Cerebral ischemia: Secondary | ICD-10-CM | POA: Diagnosis not present

## 2024-03-02 DIAGNOSIS — S0990XA Unspecified injury of head, initial encounter: Secondary | ICD-10-CM | POA: Insufficient documentation

## 2024-03-02 DIAGNOSIS — S80211A Abrasion, right knee, initial encounter: Secondary | ICD-10-CM | POA: Insufficient documentation

## 2024-03-02 DIAGNOSIS — M25561 Pain in right knee: Secondary | ICD-10-CM | POA: Diagnosis not present

## 2024-03-02 DIAGNOSIS — W19XXXA Unspecified fall, initial encounter: Secondary | ICD-10-CM | POA: Diagnosis not present

## 2024-03-02 DIAGNOSIS — S0081XA Abrasion of other part of head, initial encounter: Secondary | ICD-10-CM | POA: Diagnosis not present

## 2024-03-02 DIAGNOSIS — W01198A Fall on same level from slipping, tripping and stumbling with subsequent striking against other object, initial encounter: Secondary | ICD-10-CM | POA: Diagnosis not present

## 2024-03-02 DIAGNOSIS — I10 Essential (primary) hypertension: Secondary | ICD-10-CM | POA: Diagnosis not present

## 2024-03-02 DIAGNOSIS — R519 Headache, unspecified: Secondary | ICD-10-CM | POA: Diagnosis present

## 2024-03-02 MED ORDER — HYDROCODONE-ACETAMINOPHEN 5-325 MG PO TABS
1.0000 | ORAL_TABLET | Freq: Once | ORAL | Status: AC
  Administered 2024-03-02: 1 via ORAL
  Filled 2024-03-02: qty 1

## 2024-03-02 NOTE — Discharge Instructions (Signed)
 Today you were seen after a mechanical fall.  You may take Tylenol /Motrin  as needed for pain.  Please return to the ED if you have onset of double vision, uncontrollable vomiting, or unable to control your bowel or bladder.  Thank you for letting us  treat you today. After reviewing your imaging, I feel you are safe to go home. Please follow up with your PCP in the next several days and provide them with your records from this visit. Return to the Emergency Room if pain becomes severe or symptoms worsen.

## 2024-03-02 NOTE — ED Notes (Signed)
 Patient transported to CT

## 2024-03-02 NOTE — ED Provider Notes (Signed)
 [place] EMERGENCY DEPARTMENT AT [facility] Provider Note   CSN: [id number] Arrival date & time: 03/02/24  8413     History  Chief Complaint  Patient presents with   [name]    [name] is a 68 y.o. female presents today after mechanical fall.  Patient tripped and fell and hit her head in some bushes and the curb.  Patient denies LOC, nausea, vomiting, vision changes, numbness, weakness, loss of bowel or bladder, or saddle anesthesia.  Patient endorses right-sided headache and right knee pain.  Patient denies blood thinner use.  HPI     Home Medications Prior to Admission medications   Medication Sig Start Date End Date Taking? Authorizing Provider  acetaminophen  (TYLENOL ) 500 MG tablet Take 500 mg by mouth every 6 (six) hours as needed for mild pain (pain score 1-3).    [provider]  albuterol  (VENTOLIN  HFA) 108 (90 Base) MCG/ACT inhaler SMARTSIG:2 Puff(s) By Mouth Every 4 Hours 12/08/23   [provider]  chlorpheniramine-HYDROcodone  (TUSSIONEX) 10-8 MG/5ML Take 5 mLs by mouth every 12 (twelve) hours as needed for cough. 11/03/23   Garrison Kanner, MD  escitalopram  (LEXAPRO ) 10 MG tablet Take 1 tablet (10 mg total) by mouth daily. 07/09/22 07/09/23  Vergia Glasgow, MD  latanoprost  (XALATAN ) 0.005 % ophthalmic solution Place 1 drop into both eyes at bedtime. Patient not taking: Reported on 02/16/2024 10/17/23   [provider]  methadone  (DOLOPHINE ) 10 MG/ML solution Take 20 mLs (200 mg total) by mouth in the morning. Home med. 11/03/23   Garrison Kanner, MD  metoprolol  tartrate (LOPRESSOR ) 25 MG tablet Take 25 mg by mouth 2 (two) times daily.    [provider]  tiZANidine (ZANAFLEX) 2 MG tablet Take 2 mg by mouth at bedtime. 01/04/24   [provider]      Allergies    Acetaminophen -codeine, Morphine  and codeine, Nitroglycerin , Tramadol  hcl, and Azithromycin    Review of Systems   Review of Systems  Musculoskeletal:  Positive for  arthralgias.  Neurological:  Positive for headaches.    Physical Exam Updated Vital Signs BP (!) 142/63 (BP Location: Right Arm)   Pulse 85   Temp 98.3 F (36.8 C) (Oral)   Resp 16   SpO2 95%  Physical Exam Vitals and nursing note reviewed.  Constitutional:      General: She is not in acute distress.    Appearance: Normal appearance. She is well-developed. She is not toxic-appearing.     Comments: Chronically ill-appearing  HENT:     Head: Normocephalic and atraumatic.     Comments: No deformity, ecchymosis, abrasion, laceration, or injury noted.  Patient does report right ear and forehead pain.    Right Ear: External ear normal.     Left Ear: External ear normal.     Nose: Nose normal.  Eyes:     Conjunctiva/sclera: Conjunctivae normal.  Cardiovascular:     Rate and Rhythm: Normal rate and regular rhythm.     Pulses: Normal pulses.     Heart sounds: Normal heart sounds.  Pulmonary:     Effort: Pulmonary effort is normal. No respiratory distress.     Breath sounds: Normal breath sounds.  Abdominal:     Palpations: Abdomen is soft.     Tenderness: There is no abdominal tenderness.  Musculoskeletal:        General: Tenderness present. No swelling or deformity.     Cervical back: Neck supple. No rigidity or tenderness.  Right lower leg: No edema.     Left lower leg: No edema.  Skin:    General: Skin is warm and dry.     Capillary Refill: Capillary refill takes less than 2 seconds.     Comments: Patient with 2 superficial abrasions to her right knee, hemostatic on exam.  Patient with full ROM and mild tenderness to palpation of the knee.  Neurological:     General: No focal deficit present.     Mental Status: She is alert.     Motor: No weakness.  Psychiatric:        Mood and Affect: Mood normal.     ED Results / Procedures / Treatments   Labs (all labs ordered are listed, but only abnormal results are displayed) Labs Reviewed - No data to  display  EKG None  Radiology CT Head Wo Contrast Result Date: 03/02/2024 CLINICAL DATA:  Mechanical fall with headache and facial abrasion. EXAM: CT HEAD WITHOUT CONTRAST CT MAXILLOFACIAL WITHOUT CONTRAST CT CERVICAL SPINE WITHOUT CONTRAST TECHNIQUE: Multidetector CT imaging of the head, cervical spine, and maxillofacial structures were performed using the standard protocol without intravenous contrast. Multiplanar CT image reconstructions of the cervical spine and maxillofacial structures were also generated. RADIATION DOSE REDUCTION: This exam was performed according to the departmental dose-optimization program which includes automated exposure control, adjustment of the mA and/or kV according to patient size and/or use of iterative reconstruction technique. COMPARISON:  None Available. FINDINGS: CT HEAD FINDINGS Brain: No evidence of acute infarction, hemorrhage, hydrocephalus, extra-axial collection or mass lesion/mass effect. Mild moderate low-density in the cerebral white matter attributed to chronic small vessel ischemia. Vascular: No hyperdense vessel or unexpected calcification. Skull: No acute fracture CT MAXILLOFACIAL FINDINGS Osseous: No fracture or mandibular dislocation. No destructive process. Orbits: Negative. No traumatic or inflammatory finding. Sinuses: Clear. Soft tissues: Negative. CT CERVICAL SPINE FINDINGS Alignment: No traumatic malalignment. Skull base and vertebrae: No acute fracture. No primary bone lesion or focal pathologic process. Soft tissues and spinal canal: No prevertebral fluid or swelling. No visible canal hematoma. Disc levels:  Generalized degenerative endplate and facet spurring. Upper chest: No evidence of injury IMPRESSION: No evidence of acute intracranial or cervical spine injury. Negative for facial fracture. Electronically Signed   By: Ronnette Coke M.D.   On: 03/02/2024 10:30   CT Cervical Spine Wo Contrast Result Date: 03/02/2024 CLINICAL DATA:  Mechanical  fall with headache and facial abrasion. EXAM: CT HEAD WITHOUT CONTRAST CT MAXILLOFACIAL WITHOUT CONTRAST CT CERVICAL SPINE WITHOUT CONTRAST TECHNIQUE: Multidetector CT imaging of the head, cervical spine, and maxillofacial structures were performed using the standard protocol without intravenous contrast. Multiplanar CT image reconstructions of the cervical spine and maxillofacial structures were also generated. RADIATION DOSE REDUCTION: This exam was performed according to the departmental dose-optimization program which includes automated exposure control, adjustment of the mA and/or kV according to patient size and/or use of iterative reconstruction technique. COMPARISON:  None Available. FINDINGS: CT HEAD FINDINGS Brain: No evidence of acute infarction, hemorrhage, hydrocephalus, extra-axial collection or mass lesion/mass effect. Mild moderate low-density in the cerebral white matter attributed to chronic small vessel ischemia. Vascular: No hyperdense vessel or unexpected calcification. Skull: No acute fracture CT MAXILLOFACIAL FINDINGS Osseous: No fracture or mandibular dislocation. No destructive process. Orbits: Negative. No traumatic or inflammatory finding. Sinuses: Clear. Soft tissues: Negative. CT CERVICAL SPINE FINDINGS Alignment: No traumatic malalignment. Skull base and vertebrae: No acute fracture. No primary bone lesion or focal pathologic process. Soft tissues and spinal canal:  No prevertebral fluid or swelling. No visible canal hematoma. Disc levels:  Generalized degenerative endplate and facet spurring. Upper chest: No evidence of injury IMPRESSION: No evidence of acute intracranial or cervical spine injury. Negative for facial fracture. Electronically Signed   By: Ronnette Coke M.D.   On: 03/02/2024 10:30   CT Maxillofacial WO CM Result Date: 03/02/2024 CLINICAL DATA:  Mechanical fall with headache and facial abrasion. EXAM: CT HEAD WITHOUT CONTRAST CT MAXILLOFACIAL WITHOUT CONTRAST CT  CERVICAL SPINE WITHOUT CONTRAST TECHNIQUE: Multidetector CT imaging of the head, cervical spine, and maxillofacial structures were performed using the standard protocol without intravenous contrast. Multiplanar CT image reconstructions of the cervical spine and maxillofacial structures were also generated. RADIATION DOSE REDUCTION: This exam was performed according to the departmental dose-optimization program which includes automated exposure control, adjustment of the mA and/or kV according to patient size and/or use of iterative reconstruction technique. COMPARISON:  None Available. FINDINGS: CT HEAD FINDINGS Brain: No evidence of acute infarction, hemorrhage, hydrocephalus, extra-axial collection or mass lesion/mass effect. Mild moderate low-density in the cerebral white matter attributed to chronic small vessel ischemia. Vascular: No hyperdense vessel or unexpected calcification. Skull: No acute fracture CT MAXILLOFACIAL FINDINGS Osseous: No fracture or mandibular dislocation. No destructive process. Orbits: Negative. No traumatic or inflammatory finding. Sinuses: Clear. Soft tissues: Negative. CT CERVICAL SPINE FINDINGS Alignment: No traumatic malalignment. Skull base and vertebrae: No acute fracture. No primary bone lesion or focal pathologic process. Soft tissues and spinal canal: No prevertebral fluid or swelling. No visible canal hematoma. Disc levels:  Generalized degenerative endplate and facet spurring. Upper chest: No evidence of injury IMPRESSION: No evidence of acute intracranial or cervical spine injury. Negative for facial fracture. Electronically Signed   By: Ronnette Coke M.D.   On: 03/02/2024 10:30   DG Knee Complete 4 Views Right Result Date: 03/02/2024 CLINICAL DATA:  Pain after fall EXAM: RIGHT KNEE - COMPLETE 4 VIEW COMPARISON:  None Available. FINDINGS: No evidence of fracture, dislocation, or joint effusion. No evidence of arthropathy or other focal bone abnormality. Soft tissues are  unremarkable. IMPRESSION: No acute osseous abnormality. Electronically Signed   By: Adrianna Horde M.D.   On: 03/02/2024 09:54    Procedures Procedures    Medications Ordered in ED Medications  HYDROcodone -acetaminophen  (NORCO/VICODIN) 5-325 MG per tablet 1 tablet (1 tablet Oral Given 03/02/24 0859)    ED Course/ Medical Decision Making/ A&P                                 Medical Decision Making Amount and/or Complexity of Data Reviewed Radiology: ordered.  Risk Prescription drug management.   This patient presents to the ED for concern of mechanical fall differential diagnosis includes patellar fracture, tibia fracture, femur fracture, brain bleed, facial bone fracture, cauda equina syndrome, epidural abscess, musculoskeletal pain, vertebral fracture   Imaging Studies ordered:  I ordered imaging studies including CT head, C-spine, maxillofacial Noncon and right knee x-ray I independently visualized and interpreted imaging which showed no evidence of acute intracranial or C-spine injury.  Negative for facial fracture or acute osseous abnormality of the right knee I agree with the radiologist interpretation   Medicines ordered and prescription drug management:  I ordered medication including Vicodin for pain Reevaluation of the patient after these medicines showed that the patient improved I have reviewed the patients home medicines and have made adjustments as needed   Problem List / ED Course:  Considered for admission or further workup however patient's vital signs, physical exam, and imaging of been reassuring.  Patient symptoms likely due to minor head injury or musculoskeletal pain.  Patient advised to take Tylenol /Motrin  as needed for pain.  Patient given return precautions.  I feel patient is safe for discharge at this time.        Final Clinical Impression(s) / ED Diagnoses Final diagnoses:  Fall, initial encounter  Minor head injury, initial encounter     Rx / DC Orders ED Discharge Orders     None         Carie Charity, PA-C 03/02/24 1036    Russella Courts A, DO 03/04/24 [phone]

## 2024-03-02 NOTE — ED Triage Notes (Signed)
 Patient had mechanical fall while walking in a parking lot. [place] Slider across a curb and landed with knee on the sidewalk and face in mulch of a flower bed. No LOC, no blood thinners. Abrasions to R knee complains of headache.

## 2024-03-12 DIAGNOSIS — F1120 Opioid dependence, uncomplicated: Secondary | ICD-10-CM | POA: Diagnosis not present

## 2024-03-22 DIAGNOSIS — D4819 Other specified neoplasm of uncertain behavior of connective and other soft tissue: Secondary | ICD-10-CM | POA: Diagnosis not present

## 2024-03-22 NOTE — Progress Notes (Signed)
 [facility] - Clinic Note  03/29/2024   CHIEF COMPLAINT Patient presents for Retina Follow Up  HISTORY OF PRESENT ILLNESS: [name] is a 68 y.o. female who presents to the clinic today for:  HPI     Retina Follow Up   Patient presents with  Wet AMD.  In both eyes.  This started 6 weeks ago.  I, the attending physician,  performed the HPI with the patient and updated documentation appropriately.        Comments   Patient here for 6 weeks retina follow up for exu ARMD OU. Patient states vision OS is getting blurry when reading. No eye pain. Eyes burn a lot. Taking last amoxicillin pill today for eye infection. Gone now. On Latanoprost  QHS OU.      Last edited by Ronelle Coffee, MD on 03/29/2024 11:53 PM.     Pt feels like her left eye vision is decreased, she had to see Dr. Micael Adas last week for an infection   Referring physician: [place], [place], OD [address] [place],  [place] [address]  HISTORICAL INFORMATION:  Selected notes from the MEDICAL RECORD NUMBER Referred by Dr. Micael Adas for concern of ARMD  LEE:  Ocular Hx- PMH-   CURRENT MEDICATIONS: Current Outpatient Medications (Ophthalmic Drugs)  Medication Sig   latanoprost  (XALATAN ) 0.005 % ophthalmic solution Place 1 drop into both eyes at bedtime.   No current facility-administered medications for this visit. (Ophthalmic Drugs)   Current Outpatient Medications (Other)  Medication Sig   acetaminophen  (TYLENOL ) 500 MG tablet Take 500 mg by mouth every 6 (six) hours as needed for mild pain (pain score 1-3).   albuterol  (VENTOLIN  HFA) 108 (90 Base) MCG/ACT inhaler SMARTSIG:2 Puff(s) By Mouth Every 4 Hours   chlorpheniramine-HYDROcodone  (TUSSIONEX) 10-8 MG/5ML Take 5 mLs by mouth every 12 (twelve) hours as needed for cough.   methadone  (DOLOPHINE ) 10 MG/ML solution Take 20 mLs (200 mg total) by mouth in the morning. Home med.   metoprolol  tartrate (LOPRESSOR ) 25 MG tablet Take 25 mg  by mouth 2 (two) times daily.   tiZANidine (ZANAFLEX) 2 MG tablet Take 2 mg by mouth at bedtime.   escitalopram  (LEXAPRO ) 10 MG tablet Take 1 tablet (10 mg total) by mouth daily.   No current facility-administered medications for this visit. (Other)   REVIEW OF SYSTEMS: ROS   Positive for: Cardiovascular, Eyes Negative for: Constitutional, Gastrointestinal, Neurological, Skin, Genitourinary, Musculoskeletal, HENT, Endocrine, Respiratory, Psychiatric, Allergic/Imm, Heme/Lymph Last edited by Sylvan Evener, COA on 03/29/2024  8:09 AM.       ALLERGIES Allergies  Allergen Reactions   Acetaminophen -Codeine Other (See Comments)    Hand get red, feels like it's on fire   Morphine  And Codeine     makes me feel like i'm on fire   Nitroglycerin      Causes a headache   Tramadol  Hcl     Gives her the shakes   Azithromycin Itching and Rash   PAST MEDICAL HISTORY Past Medical History:  Diagnosis Date   Acute hepatitis C without mention of hepatic coma(070.51)    Anemia    Arthritis    back   Carpal tunnel syndrome    Chest pain, unspecified    Chronic airway obstruction, not elsewhere classified    Chronic hepatitis C without hepatic coma (HCC) 02/09/2018   Colitis    Coronary atherosclerosis of unspecified type of vessel, native or graft    Disorders of bursae and tendons  in shoulder region, unspecified    Drug-induced skin rash 04/07/2018   GERD (gastroesophageal reflux disease)    Hardware complicating wound infection (HCC) 02/09/2018   Headache    migraines   Hepatitis    hx of hepatitis C   Infective otitis externa, unspecified    Mixed hyperlipidemia    MRSA infection 02/09/2018   Nonspecific abnormal results of thyroid function study    Other bursitis disorders    Other symptoms involving nervous and musculoskeletal systems(781.99)    Pneumonia    Seizures (HCC)    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   Thrush 04/07/2018   Past Surgical History:   Procedure Laterality Date   ABDOMINAL HYSTERECTOMY     CARDIAC CATHETERIZATION     CARDIAC CATHETERIZATION N/A 06/25/2016   Procedure: Left Heart Cath and Coronary Angiography;  Surgeon: Pasqual Bone, MD;  Location: [facility];  Service: Cardiovascular;  Laterality: N/A;   CESAREAN SECTION     CHOLECYSTECTOMY     COLONOSCOPY     IR FLUORO GUIDED NEEDLE PLC ASPIRATION/INJECTION LOC  12/27/2017   LEFT HEART CATH AND CORONARY ANGIOGRAPHY N/A 06/06/2022   Procedure: LEFT HEART CATH AND CORONARY ANGIOGRAPHY;  Surgeon: Pasqual Bone, MD;  Location: [facility];  Service: Cardiovascular;  Laterality: N/A;   LUMBAR FUSION  01/13/2018   Transforaminal lumbar interbody fusion L4-5 with screws, cages and rods, local bone graft, Allograft, Vivigen, Decompression L4-5 and L5-S1   TONSILLECTOMY     FAMILY HISTORY Family History  Problem Relation Age of Onset   Colonic polyp Mother    Thyroid disease Mother    COPD Mother    Macular degeneration Mother    Uterine cancer Maternal Grandmother    Crohn's disease Daughter 78       colon resection   Irritable bowel syndrome Daughter    Heart murmur Daughter        SVT   Colon cancer Neg Hx    SOCIAL HISTORY Social History   Tobacco Use   Smoking status: Former    Current packs/day: 0.00    Types: Cigarettes    Start date: 06/16/1969    Quit date: 06/16/2022    Years since quitting: 1.7   Smokeless tobacco: Never   Tobacco comments:    2 cigarettes a day  Vaping Use   Vaping status: Former   Quit date: 02/26/2022  Substance Use Topics   Alcohol use: No   Drug use: No       OPHTHALMIC EXAM:  Base Eye Exam     Visual Acuity (Snellen - Linear)       Right Left   Dist cc 20/250 20/60 -2    Correction: Glasses         Tonometry (Tonopen, 8:06 AM)       Right Left   Pressure 21 17         Pupils       Dark Light Shape React APD   Right 3 2 Round Brisk None   Left 3 2 Round Brisk None         Visual  Fields (Counting fingers)       Left Right    Full Full         Extraocular Movement       Right Left    Full, Ortho Full, Ortho         Neuro/Psych     Oriented x3: Yes  Mood/Affect: Normal         Dilation     Both eyes: 1.0% Mydriacyl, 2.5% Phenylephrine  @ 8:06 AM           Slit Lamp and Fundus Exam     Slit Lamp Exam       Right Left   Lids/Lashes Dermatochalasis - upper lid Dermatochalasis - upper lid   Conjunctiva/Sclera nasal and temporal pinguecula nasal and temporal pinguecula   Cornea 2+ Punctate epithelial erosions, tear film debris 2+ Punctate epithelial erosions, tear film debris   Anterior Chamber deep and clear deep and clear   Iris Round and dilated Round and dilated   Lens 2+ Nuclear sclerosis, 2+ Cortical cataract 2+ Nuclear sclerosis, 2+ Cortical cataract   Anterior Vitreous mild syneresis mild syneresis, Posterior vitreous detachment         Fundus Exam       Right Left   Disc Pink and Sharp, +cupping Pink and Sharp, PPA   C/D Ratio 0.7 0.3   Macula Blunted foveal reflex, central subretinal fibrosis / scar with surrounding atrophy, +pigment clumping, no heme or edema Blunted foveal reflex, central CNV with pigment ring and edema, shallow SRF -- stably improved, no frank heme, RPE mottling, clumping and atrophy   Vessels attenuated, Tortuous attenuated, mild tortuosity   Periphery Attached, No heme Attached, No heme           Refraction     Wearing Rx       Sphere Cylinder Axis   Right -2.75 +1.50 121   Left -2.75 +1.50 120    Type: SVL           IMAGING AND PROCEDURES  Imaging and Procedures for 03/29/2024  OCT, Retina - OU - Both Eyes       Right Eye Quality was good. Central Foveal Thickness: 206. Progression has no prior data. Findings include no IRF, no SRF, abnormal foveal contour, retinal drusen , outer retinal atrophy [facility] -- large central disciform scar; no fluid).   Left Eye Quality was good.  Central Foveal Thickness: 301. Progression has no prior data. Findings include no IRF, no SRF, abnormal foveal contour, retinal drusen , subretinal hyper-reflective material, choroidal neovascular membrane (Central CNV with stable improvement in SRF and edema; persistent SRHM).   Notes *Images captured and stored on drive  Diagnosis / Impression:  Exudative ARMD OU OD: Central SRHM -- large central disciform scar; no fluid OS: Central CNV with stable improvement in SRF and edema; persistent SRHM  Clinical management:  See below  Abbreviations: NFP - Normal foveal profile. CME - cystoid macular edema. PED - pigment epithelial detachment. IRF - intraretinal fluid. SRF - subretinal fluid. EZ - ellipsoid zone. ERM - epiretinal membrane. ORA - outer retinal atrophy. ORT - outer retinal tubulation. SRHM - subretinal hyper-reflective material. IRHM - intraretinal hyper-reflective material      Intravitreal Injection, Pharmacologic Agent - OS - Left Eye       Time Out 03/29/2024. 9:06 AM. Confirmed correct patient, procedure, site, and patient consented.   Anesthesia Topical anesthesia was used. Anesthetic medications included Lidocaine  2%, Proparacaine 0.5%.   Procedure Preparation included 5% betadine to ocular surface, eyelid speculum. A (32g) needle was used.   Injection: 2 mg aflibercept  2 MG/0.05ML   Route: Intravitreal, Site: Left Eye   NDC: [id number], Lot: [id number], Expiration date: 06/13/2025, Waste: 0 mL   Post-op Post injection exam found visual acuity of at least counting fingers. The patient tolerated the  procedure well. There were no complications. The patient received written and verbal post procedure care education.           ASSESSMENT/PLAN:   ICD-10-CM   1. Exudative age-related macular degeneration of both eyes with active choroidal neovascularization (HCC)  H35.3231 OCT, Retina - OU - Both Eyes    Intravitreal Injection, Pharmacologic Agent - OS - Left Eye     aflibercept  (EYLEA ) SOLN 2 mg    2. Essential hypertension  I10     3. Hypertensive retinopathy of both eyes  H35.033     4. Combined forms of age-related cataract of both eyes  H25.813      Exudative age related macular degeneration, both eyes - OD -- inactive with disciform scar - OS w/ central edema and shallow SRF - s/p IVA OS #1 (09.03.24), #2 (10.01.24), #3 (10.29.24) -- IVA resistance - s/p IVE OS #1 (12.02.24), #2 (12.30.24), #3 (02.03.25), #4 (03.17.25), #5 (05.05.25) - BCVA OD stable at 20/250; OS stable at 20/60  - OCT shows OD: Central SRHM -- large central disciform scar; no fluid; OS: central CNV with stable improvement in SRF and edema; persistent SRHM at 6 wks  - recommend IVE OS #6 today, 06.16.25 w/ f/u in 6 wks  - pt wishes to proceed with injection  - RBA of procedure discussed, questions answered - IVE informed consent obtained and signed, 12.02.24 (OS) - see procedure note - f/u in 6 wks, DFE, OCT, possible injxn   2,3. Hypertensive retinopathy OU - discussed importance of tight BP control - monitor  - monitor  4. Mixed Cataract OU - The symptoms of cataract, surgical options, and treatments and risks were discussed with patient. - discussed diagnosis and progression - monitor  Ophthalmic Meds Ordered this visit:  Meds ordered this encounter  Medications   aflibercept  (EYLEA ) SOLN 2 mg     Return in about 6 weeks (around 05/10/2024) for f/u exu ARMD OU, DFE, OCT.  There are no Patient Instructions on file for this visit.  Explained the diagnoses, plan, and follow up with the patient and they expressed understanding.  Patient expressed understanding of the importance of proper follow up care.   This document serves as a record of services personally performed by Jeanice Millard, MD, PhD. It was created on their behalf by Mayola Specking, COA an ophthalmic technician. The creation of this record is the provider's dictation and/or activities during the  visit.   Electronically signed by: Carrington Clack, COT  03/29/24  11:56 PM   This document serves as a record of services personally performed by Jeanice Millard, MD, PhD. It was created on their behalf by Morley Arabia. Bevin Bucks, OA an ophthalmic technician. The creation of this record is the provider's dictation and/or activities during the visit.    Electronically signed by: Morley Arabia. Bevin Bucks, OA 03/29/24 11:56 PM  Jeanice Millard, M.D., Ph.D. Diseases & Surgery of the Retina and Vitreous [facility] 03/29/2024  I have reviewed the above documentation for accuracy and completeness, and I agree with the above. Jeanice Millard, M.D., Ph.D. 03/29/24 11:57 PM   Abbreviations: M myopia (nearsighted); A astigmatism; H hyperopia (farsighted); P presbyopia; Mrx spectacle prescription;  CTL contact lenses; OD right eye; OS left eye; OU both eyes  XT exotropia; ET esotropia; PEK punctate epithelial keratitis; PEE punctate epithelial erosions; DES dry eye syndrome; MGD meibomian gland dysfunction; ATs artificial tears; PFAT's preservative free artificial tears; NSC nuclear  sclerotic cataract; PSC posterior subcapsular cataract; ERM epi-retinal membrane; PVD posterior vitreous detachment; RD retinal detachment; DM diabetes mellitus; DR diabetic retinopathy; NPDR non-proliferative diabetic retinopathy; PDR proliferative diabetic retinopathy; CSME clinically significant macular edema; DME diabetic macular edema; dbh dot blot hemorrhages; CWS cotton wool spot; POAG primary open angle glaucoma; C/D cup-to-disc ratio; HVF humphrey visual field; GVF goldmann visual field; OCT optical coherence tomography; IOP intraocular pressure; BRVO Branch retinal vein occlusion; CRVO central retinal vein occlusion; CRAO central retinal artery occlusion; BRAO branch retinal artery occlusion; RT retinal tear; SB scleral buckle; PPV pars plana vitrectomy; VH Vitreous hemorrhage; PRP panretinal laser photocoagulation;  IVK intravitreal kenalog; VMT vitreomacular traction; MH Macular hole;  NVD neovascularization of the disc; NVE neovascularization elsewhere; AREDS age related eye disease study; ARMD age related macular degeneration; POAG primary open angle glaucoma; EBMD epithelial/anterior basement membrane dystrophy; ACIOL anterior chamber intraocular lens; IOL intraocular lens; PCIOL posterior chamber intraocular lens; Phaco/IOL phacoemulsification with intraocular lens placement; PRK photorefractive keratectomy; LASIK laser assisted in situ keratomileusis; HTN hypertension; DM diabetes mellitus; COPD chronic obstructive pulmonary disease

## 2024-03-26 DIAGNOSIS — F1120 Opioid dependence, uncomplicated: Secondary | ICD-10-CM | POA: Diagnosis not present

## 2024-03-29 ENCOUNTER — Encounter (INDEPENDENT_AMBULATORY_CARE_PROVIDER_SITE_OTHER): Payer: Self-pay | Admitting: Ophthalmology

## 2024-03-29 ENCOUNTER — Ambulatory Visit (INDEPENDENT_AMBULATORY_CARE_PROVIDER_SITE_OTHER): Admitting: Ophthalmology

## 2024-03-29 DIAGNOSIS — I10 Essential (primary) hypertension: Secondary | ICD-10-CM

## 2024-03-29 DIAGNOSIS — H353231 Exudative age-related macular degeneration, bilateral, with active choroidal neovascularization: Secondary | ICD-10-CM | POA: Diagnosis not present

## 2024-03-29 DIAGNOSIS — H25813 Combined forms of age-related cataract, bilateral: Secondary | ICD-10-CM

## 2024-03-29 DIAGNOSIS — H35033 Hypertensive retinopathy, bilateral: Secondary | ICD-10-CM | POA: Diagnosis not present

## 2024-03-29 MED ORDER — AFLIBERCEPT 2MG/0.05ML IZ SOLN FOR KALEIDOSCOPE: 2.0000 mg | INTRAVITREAL | Status: AC | PRN

## 2024-04-08 ENCOUNTER — Encounter: Payer: Self-pay | Admitting: Family Medicine

## 2024-04-08 ENCOUNTER — Ambulatory Visit (INDEPENDENT_AMBULATORY_CARE_PROVIDER_SITE_OTHER): Admitting: Family Medicine

## 2024-04-08 VITALS — BP 122/80 | HR 63 | Temp 98.0°F | Ht 59.75 in | Wt 188.1 lb

## 2024-04-08 DIAGNOSIS — F329 Major depressive disorder, single episode, unspecified: Secondary | ICD-10-CM | POA: Insufficient documentation

## 2024-04-08 DIAGNOSIS — G8929 Other chronic pain: Secondary | ICD-10-CM

## 2024-04-08 DIAGNOSIS — F321 Major depressive disorder, single episode, moderate: Secondary | ICD-10-CM

## 2024-04-08 DIAGNOSIS — Z131 Encounter for screening for diabetes mellitus: Secondary | ICD-10-CM

## 2024-04-08 DIAGNOSIS — E782 Mixed hyperlipidemia: Secondary | ICD-10-CM

## 2024-04-08 DIAGNOSIS — M5441 Lumbago with sciatica, right side: Secondary | ICD-10-CM

## 2024-04-08 DIAGNOSIS — H3530 Unspecified macular degeneration: Secondary | ICD-10-CM | POA: Insufficient documentation

## 2024-04-08 DIAGNOSIS — B1710 Acute hepatitis C without hepatic coma: Secondary | ICD-10-CM

## 2024-04-08 DIAGNOSIS — R635 Abnormal weight gain: Secondary | ICD-10-CM | POA: Diagnosis not present

## 2024-04-08 DIAGNOSIS — J441 Chronic obstructive pulmonary disease with (acute) exacerbation: Secondary | ICD-10-CM

## 2024-04-08 MED ORDER — DULOXETINE HCL 30 MG PO CPEP
30.0000 mg | ORAL_CAPSULE | Freq: Every day | ORAL | 3 refills | Status: DC
Start: 2024-04-08 — End: 2024-04-30

## 2024-04-08 MED ORDER — GABAPENTIN 300 MG PO CAPS
300.0000 mg | ORAL_CAPSULE | Freq: Two times a day (BID) | ORAL | 3 refills | Status: DC
Start: 2024-04-08 — End: 2024-05-21

## 2024-04-08 NOTE — Assessment & Plan Note (Signed)
 Using muscle relaxant prn.  Was better controlled in past on gabapentin   .. Was on 400 mg 3 tablets twice daily.. no SE per pt.   Will restart at lower dose and titrate up as needed.

## 2024-04-08 NOTE — Assessment & Plan Note (Addendum)
 Not clear diagnosis with PFTs per patient.  On Symbicort, moderate control.

## 2024-04-08 NOTE — Progress Notes (Signed)
 Patient ID: [name], female    DOB: 06/19/1956, 68 y.o.   MRN: [id number]  This visit was conducted in person.  BP 122/80   Pulse 63   Temp 98 F (36.7 C) (Oral)   Ht 4' 11.75 (1.518 m)   Wt 188 lb 2 oz (85.3 kg)   SpO2 93%   BMI 37.05 kg/m    CC:  Chief Complaint  Patient presents with   New Patient (Initial Visit)    TOC from Dr Claudene.     Subjective:   HPI: [name] is a 68 y.o. female presenting on 04/08/2024 for New Patient (Initial Visit) (TOC from Dr Claudene. ) Here with daughter [name] who helps with her care.  Previous PCP: Dr. Claudene  GYN: S/P  Hysterectomy  [facility] Last CPX: none in last 2 years.    Reviewed last OV: 02/04/2024 Dr. Claudene For chest wall pain  CXR : unremarkable except trace plerual effusion.  Hepatitis C: HAS been on a antiviral in past  ID Dr. Fleeta   History of bladder laceration during hysterectomy in 1999 Had  bladder repair with donor tissue.  PNA and sepsis admission in 10/2023.. cough improved but still atypical precordial CP.   COPD, not sure about diagnosis.. former smoker. Quit in 10/2023  On Symbicort   CAD followed by Cardiology Dr. Claudene.  Chronic back pain S/P multiple  surgery x 3  Dr. Lucilla. 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 most recent 2023  Still having daily pain.. was doing better on   gabapentin  in past.   MDD, lost mother earlier in year.  Mood poorly controlled on sertraline 100 mg dialy.    04/08/2024    3:19 PM 04/07/2018    9:51 AM 03/06/2018    9:15 AM  PHQ9 SCORE ONLY  PHQ-9 Total Score 12 1  0      Data saved with a previous flowsheet row definition      04/08/2024    3:19 PM  GAD 7 : Generalized Anxiety Score  Nervous, Anxious, on Edge 2  Control/stop worrying 2  Worry too much - different things 2  Trouble relaxing 2  Restless 2  Easily annoyed or irritable 1  Afraid - awful might happen 0  Total GAD 7 Score 11  Anxiety Difficulty Somewhat  difficult       Relevant past medical, surgical, family and social history reviewed and updated as indicated. Interim medical history since our last visit reviewed. Allergies and medications reviewed and updated. Outpatient Medications Prior to Visit  Medication Sig Dispense Refill   acetaminophen  (TYLENOL ) 500 MG tablet Take 500 mg by mouth every 6 (six) hours as needed for mild pain (pain score 1-3).     albuterol  (VENTOLIN  HFA) 108 (90 Base) MCG/ACT inhaler SMARTSIG:2 Puff(s) By Mouth Every 4 Hours     amLODipine  (NORVASC ) 5 MG tablet Take 5 mg by mouth daily.     latanoprost  (XALATAN ) 0.005 % ophthalmic solution Place 1 drop into both eyes at bedtime.     methadone  (DOLOPHINE ) 10 MG/ML solution Take 20 mLs (200 mg total) by mouth in the morning. Home med.     SYMBICORT 160-4.5 MCG/ACT inhaler 1 puff 2 (two) times daily.     tiZANidine (ZANAFLEX) 2 MG tablet Take 2 mg by mouth at bedtime.     sertraline (ZOLOFT) 100 MG tablet Take 100 mg by mouth daily.  metoprolol  tartrate (LOPRESSOR ) 25 MG tablet Take 25 mg by mouth 2 (two) times daily.     chlorpheniramine-HYDROcodone  (TUSSIONEX) 10-8 MG/5ML Take 5 mLs by mouth every 12 (twelve) hours as needed for cough. 70 mL 0   escitalopram  (LEXAPRO ) 10 MG tablet Take 1 tablet (10 mg total) by mouth daily. 30 tablet 2   No facility-administered medications prior to visit.     Per HPI unless specifically indicated in ROS section below Review of Systems  Constitutional:  Positive for fatigue. Negative for fever.  HENT:  Negative for congestion.        Hoarse voice  Eyes:  Negative for pain.  Respiratory:  Negative for cough and shortness of breath.   Cardiovascular:  Positive for chest pain. Negative for palpitations and leg swelling.  Gastrointestinal:  Negative for abdominal pain.  Genitourinary:  Negative for dysuria and vaginal bleeding.  Musculoskeletal:  Positive for back pain.  Neurological:  Negative for syncope,  light-headedness and headaches.  Psychiatric/Behavioral:  Positive for dysphoric mood and sleep disturbance. The patient is nervous/anxious.    Objective:  BP 122/80   Pulse 63   Temp 98 F (36.7 C) (Oral)   Ht 4' 11.75 (1.518 m)   Wt 188 lb 2 oz (85.3 kg)   SpO2 93%   BMI 37.05 kg/m   Wt Readings from Last 3 Encounters:  04/08/24 188 lb 2 oz (85.3 kg)  10/30/23 138 lb 14.2 oz (63 kg)  07/09/22 139 lb (63 kg)      Physical Exam Constitutional:      General: She is not in acute distress.    Appearance: Normal appearance. She is well-developed. She is not ill-appearing or toxic-appearing.  HENT:     Head: Normocephalic.     Right Ear: Hearing, tympanic membrane, ear canal and external ear normal. Tympanic membrane is not erythematous, retracted or bulging.     Left Ear: Hearing, tympanic membrane, ear canal and external ear normal. Tympanic membrane is not erythematous, retracted or bulging.     Nose: No mucosal edema or rhinorrhea.     Right Sinus: No maxillary sinus tenderness or frontal sinus tenderness.     Left Sinus: No maxillary sinus tenderness or frontal sinus tenderness.     Mouth/Throat:     Pharynx: Uvula midline.   Eyes:     General: Lids are normal. Lids are everted, no foreign bodies appreciated.     Conjunctiva/sclera: Conjunctivae normal.     Pupils: Pupils are equal, round, and reactive to light.   Neck:     Thyroid: No thyroid mass or thyromegaly.     Vascular: No carotid bruit.     Trachea: Trachea normal.   Cardiovascular:     Rate and Rhythm: Normal rate and regular rhythm.     Pulses: Normal pulses.     Heart sounds: Normal heart sounds, S1 normal and S2 normal. No murmur heard.    No friction rub. No gallop.  Pulmonary:     Effort: Pulmonary effort is normal. No tachypnea or respiratory distress.     Breath sounds: Normal breath sounds. No decreased breath sounds, wheezing, rhonchi or rales.  Abdominal:     General: Bowel sounds are normal.      Palpations: Abdomen is soft.     Tenderness: There is no abdominal tenderness.   Musculoskeletal:     Cervical back: Normal range of motion and neck supple.   Skin:    General: Skin is warm and dry.  Findings: No rash.   Neurological:     Mental Status: She is alert.   Psychiatric:        Attention and Perception: Attention normal.        Mood and Affect: Mood is depressed. Mood is not anxious. Affect is flat.        Speech: Speech normal.        Behavior: Behavior normal. Behavior is cooperative.        Thought Content: Thought content normal.        Cognition and Memory: Cognition normal.        Judgment: Judgment normal.       Results for orders placed or performed during the hospital encounter of 10/30/23  Basic metabolic panel   Collection Time: 10/30/23 10:27 AM  Result Value Ref Range   Sodium 136 135 - 145 mmol/L   Potassium 3.7 3.5 - 5.1 mmol/L   Chloride 98 98 - 111 mmol/L   CO2 20 (L) 22 - 32 mmol/L   Glucose, Bld 89 70 - 99 mg/dL   BUN 19 8 - 23 mg/dL   Creatinine, Ser 8.94 (H) 0.44 - 1.00 mg/dL   Calcium  8.9 8.9 - 10.3 mg/dL   GFR, Estimated 58 (L) >60 mL/min   Anion gap 18 (H) 5 - 15  CBC   Collection Time: 10/30/23 10:27 AM  Result Value Ref Range   WBC 32.3 (H) 4.0 - 10.5 K/uL   RBC 5.03 3.87 - 5.11 MIL/uL   Hemoglobin 14.7 12.0 - 15.0 g/dL   HCT 54.1 63.9 - 53.9 %   MCV 91.1 80.0 - 100.0 fL   MCH 29.2 26.0 - 34.0 pg   MCHC 32.1 30.0 - 36.0 g/dL   RDW 84.7 88.4 - 84.4 %   Platelets 314 150 - 400 K/uL   nRBC 0.0 0.0 - 0.2 %  Resp panel by RT-PCR (RSV, Flu A&B, Covid) Anterior Nasal Swab   Collection Time: 10/30/23 10:28 AM   Specimen: Anterior Nasal Swab  Result Value Ref Range   SARS Coronavirus 2 by RT PCR NEGATIVE NEGATIVE   Influenza A by PCR NEGATIVE NEGATIVE   Influenza B by PCR NEGATIVE NEGATIVE   Resp Syncytial Virus by PCR NEGATIVE NEGATIVE  Brain natriuretic peptide   Collection Time: 10/30/23 10:28 AM  Result Value Ref  Range   B Natriuretic Peptide 418.7 (H) 0.0 - 100.0 pg/mL  Hepatic function panel   Collection Time: 10/30/23 10:28 AM  Result Value Ref Range   Total Protein 7.4 6.5 - 8.1 g/dL   Albumin  3.4 (L) 3.5 - 5.0 g/dL   AST 41 15 - 41 U/L   ALT 35 0 - 44 U/L   Alkaline Phosphatase 98 38 - 126 U/L   Total Bilirubin 1.2 0.0 - 1.2 mg/dL   Bilirubin, Direct 0.6 (H) 0.0 - 0.2 mg/dL   Indirect Bilirubin 0.6 0.3 - 0.9 mg/dL  Blood gas, venous   Collection Time: 10/30/23 12:23 PM  Result Value Ref Range   pH, Ven 7.41 7.25 - 7.43   pCO2, Ven 38 (L) 44 - 60 mmHg   pO2, Ven 38 32 - 45 mmHg   Bicarbonate 24.1 20.0 - 28.0 mmol/L   Acid-base deficit 0.3 0.0 - 2.0 mmol/L   O2 Saturation 70.2 %   Patient temperature 37.0    Collection site VEIN   Culture, blood (routine x 2)   Collection Time: 10/30/23 12:42 PM   Specimen: BLOOD  Result Value Ref  Range   Specimen Description BLOOD LEFT AC    Special Requests      BOTTLES DRAWN AEROBIC AND ANAEROBIC Blood Culture adequate volume   Culture      NO GROWTH 5 DAYS Performed at [facility], [address] [place], [place] [address]    Report Status 11/04/2023 FINAL   Lactic acid, plasma   Collection Time: 10/30/23 12:42 PM  Result Value Ref Range   Lactic Acid, Venous 1.6 0.5 - 1.9 mmol/L  Protime-INR   Collection Time: 10/30/23 12:42 PM  Result Value Ref Range   Prothrombin Time 15.2 11.4 - 15.2 seconds   INR 1.2 0.8 - 1.2  APTT   Collection Time: 10/30/23 12:42 PM  Result Value Ref Range   aPTT 31 24 - 36 seconds  Culture, blood (routine x 2)   Collection Time: 10/30/23 12:43 PM   Specimen: BLOOD  Result Value Ref Range   Specimen Description BLOOD RIGHT AC    Special Requests      BOTTLES DRAWN AEROBIC AND ANAEROBIC Blood Culture adequate volume   Culture      NO GROWTH 5 DAYS Performed at [facility], [address] [place], [place] [address]    Report Status 11/04/2023 FINAL   Lactic acid, plasma    Collection Time: 10/30/23  1:52 PM  Result Value Ref Range   Lactic Acid, Venous 1.4 0.5 - 1.9 mmol/L  Respiratory (~20 pathogens) panel by PCR   Collection Time: 10/31/23  4:00 AM   Specimen: Nasopharyngeal Swab; Respiratory  Result Value Ref Range   Adenovirus NOT DETECTED NOT DETECTED   Coronavirus 229E NOT DETECTED NOT DETECTED   Coronavirus HKU1 NOT DETECTED NOT DETECTED   Coronavirus NL63 NOT DETECTED NOT DETECTED   Coronavirus OC43 NOT DETECTED NOT DETECTED   Metapneumovirus NOT DETECTED NOT DETECTED   Rhinovirus / Enterovirus NOT DETECTED NOT DETECTED   Influenza A NOT DETECTED NOT DETECTED   Influenza B NOT DETECTED NOT DETECTED   Parainfluenza Virus 1 NOT DETECTED NOT DETECTED   Parainfluenza Virus 2 NOT DETECTED NOT DETECTED   Parainfluenza Virus 3 NOT DETECTED NOT DETECTED   Parainfluenza Virus 4 NOT DETECTED NOT DETECTED   Respiratory Syncytial Virus NOT DETECTED NOT DETECTED   Bordetella pertussis NOT DETECTED NOT DETECTED   Bordetella Parapertussis NOT DETECTED NOT DETECTED   Chlamydophila pneumoniae NOT DETECTED NOT DETECTED   Mycoplasma pneumoniae NOT DETECTED NOT DETECTED  HIV Antibody (routine testing w rflx)   Collection Time: 10/31/23  4:37 AM  Result Value Ref Range   HIV Screen 4th Generation wRfx Non Reactive Non Reactive  Basic metabolic panel   Collection Time: 10/31/23  4:37 AM  Result Value Ref Range   Sodium 135 135 - 145 mmol/L   Potassium 3.1 (L) 3.5 - 5.1 mmol/L   Chloride 98 98 - 111 mmol/L   CO2 20 (L) 22 - 32 mmol/L   Glucose, Bld 116 (H) 70 - 99 mg/dL   BUN 30 (H) 8 - 23 mg/dL   Creatinine, Ser 9.05 0.44 - 1.00 mg/dL   Calcium  8.8 (L) 8.9 - 10.3 mg/dL   GFR, Estimated >39 >39 mL/min   Anion gap 17 (H) 5 - 15  CBC   Collection Time: 10/31/23  4:37 AM  Result Value Ref Range   WBC 25.8 (H) 4.0 - 10.5 K/uL   RBC 4.63 3.87 - 5.11 MIL/uL   Hemoglobin 13.5 12.0 - 15.0 g/dL   HCT 58.7 63.9 -  46.0 %   MCV 89.0 80.0 - 100.0 fL   MCH 29.2  26.0 - 34.0 pg   MCHC 32.8 30.0 - 36.0 g/dL   RDW 84.6 88.4 - 84.4 %   Platelets 291 150 - 400 K/uL   nRBC 0.0 0.0 - 0.2 %  Procalcitonin   Collection Time: 10/31/23  5:00 AM  Result Value Ref Range   Procalcitonin 5.80 ng/mL  Legionella Pneumophila Serogp 1 Ur Ag   Collection Time: 10/31/23 11:13 AM  Result Value Ref Range   L. pneumophila Serogp 1 Ur Ag Negative Negative   Source of Sample URINE, RANDOM   Strep pneumoniae urinary antigen   Collection Time: 10/31/23 11:13 AM  Result Value Ref Range   Strep Pneumo Urinary Antigen NEGATIVE NEGATIVE  Basic metabolic panel   Collection Time: 11/01/23  5:52 AM  Result Value Ref Range   Sodium 136 135 - 145 mmol/L   Potassium 3.3 (L) 3.5 - 5.1 mmol/L   Chloride 103 98 - 111 mmol/L   CO2 24 22 - 32 mmol/L   Glucose, Bld 114 (H) 70 - 99 mg/dL   BUN 41 (H) 8 - 23 mg/dL   Creatinine, Ser 9.11 0.44 - 1.00 mg/dL   Calcium  8.8 (L) 8.9 - 10.3 mg/dL   GFR, Estimated >39 >39 mL/min   Anion gap 9 5 - 15  CBC   Collection Time: 11/01/23  5:52 AM  Result Value Ref Range   WBC 21.5 (H) 4.0 - 10.5 K/uL   RBC 4.54 3.87 - 5.11 MIL/uL   Hemoglobin 13.4 12.0 - 15.0 g/dL   HCT 59.5 63.9 - 53.9 %   MCV 89.0 80.0 - 100.0 fL   MCH 29.5 26.0 - 34.0 pg   MCHC 33.2 30.0 - 36.0 g/dL   RDW 84.3 (H) 88.4 - 84.4 %   Platelets 321 150 - 400 K/uL   nRBC 0.0 0.0 - 0.2 %  Magnesium   Collection Time: 11/01/23  5:52 AM  Result Value Ref Range   Magnesium 2.8 (H) 1.7 - 2.4 mg/dL  Basic metabolic panel   Collection Time: 11/02/23  4:57 AM  Result Value Ref Range   Sodium 140 135 - 145 mmol/L   Potassium 3.4 (L) 3.5 - 5.1 mmol/L   Chloride 107 98 - 111 mmol/L   CO2 25 22 - 32 mmol/L   Glucose, Bld 74 70 - 99 mg/dL   BUN 26 (H) 8 - 23 mg/dL   Creatinine, Ser 9.31 0.44 - 1.00 mg/dL   Calcium  8.2 (L) 8.9 - 10.3 mg/dL   GFR, Estimated >39 >39 mL/min   Anion gap 8 5 - 15  CBC   Collection Time: 11/02/23  4:57 AM  Result Value Ref Range   WBC 15.6 (H)  4.0 - 10.5 K/uL   RBC 4.10 3.87 - 5.11 MIL/uL   Hemoglobin 12.1 12.0 - 15.0 g/dL   HCT 62.3 63.9 - 53.9 %   MCV 91.7 80.0 - 100.0 fL   MCH 29.5 26.0 - 34.0 pg   MCHC 32.2 30.0 - 36.0 g/dL   RDW 84.1 (H) 88.4 - 84.4 %   Platelets 307 150 - 400 K/uL   nRBC 0.0 0.0 - 0.2 %  Basic metabolic panel   Collection Time: 11/03/23  4:24 AM  Result Value Ref Range   Sodium 143 135 - 145 mmol/L   Potassium 4.1 3.5 - 5.1 mmol/L   Chloride 108 98 - 111 mmol/L   CO2 27  22 - 32 mmol/L   Glucose, Bld 72 70 - 99 mg/dL   BUN 22 8 - 23 mg/dL   Creatinine, Ser 9.36 0.44 - 1.00 mg/dL   Calcium  8.4 (L) 8.9 - 10.3 mg/dL   GFR, Estimated >39 >39 mL/min   Anion gap 8 5 - 15  CBC   Collection Time: 11/03/23  4:24 AM  Result Value Ref Range   WBC 15.8 (H) 4.0 - 10.5 K/uL   RBC 4.40 3.87 - 5.11 MIL/uL   Hemoglobin 12.8 12.0 - 15.0 g/dL   HCT 59.9 63.9 - 53.9 %   MCV 90.9 80.0 - 100.0 fL   MCH 29.1 26.0 - 34.0 pg   MCHC 32.0 30.0 - 36.0 g/dL   RDW 83.9 (H) 88.4 - 84.4 %   Platelets 318 150 - 400 K/uL   nRBC 0.0 0.0 - 0.2 %    Assessment and Plan  HEPATITIS C Assessment & Plan: Chronic, has not seen ID or liver or ID  specialist in  8 years.  Will plan on considering referral to specialist for management and assessment of cirrhosis... referral started.  Orders: -     AMB referral to hepatitis C clinic  HYPERLIPIDEMIA, MIXED, WITH LOW HDL Assessment & Plan:  Has not been checked recently.  Was on med in  past, no SE in past.  Orders: -     Lipid panel; Future -     Comprehensive metabolic panel with GFR; Future  COPD with acute exacerbation [facility]) Assessment & Plan: Not clear diagnosis with PFTs per patient.  On Symbicort, moderate control.   Chronic low back pain with right-sided sciatica, unspecified back pain laterality Assessment & Plan:  Using muscle relaxant prn.  Was better controlled in past on gabapentin   .. Was on 400 mg 3 tablets twice daily.. no SE per pt.   Will restart  at lower dose and titrate up as needed.   Bilateral age-related macular degeneration Assessment & Plan:  Followed by Ophthalmology.   Current moderate episode of major depressive disorder without prior episode [facility]) Assessment & Plan:  New, poor control on sertraline 100 mg daily.  She is open to counseling.  Stop sertraline.  Change   to cymbalta 30 mg daily at night.   Weight gain -     TSH; Future  Diabetes mellitus screening -     Hemoglobin A1c; Future  Other orders -     Gabapentin ; Take 1 capsule (300 mg total) by mouth 2 (two) times daily.  Dispense: 60 capsule; Refill: 3 -     DULoxetine HCl; Take 1 capsule (30 mg total) by mouth daily.  Dispense: 30 capsule; Refill: 3    Return in about 6 weeks (around 05/20/2024) for phone AMW,  fasting labs then CPE with me.   Greig Ring, MD

## 2024-04-08 NOTE — Assessment & Plan Note (Signed)
 New, poor control on sertraline 100 mg daily.  She is open to counseling.  Stop sertraline.  Change   to cymbalta 30 mg daily at night.

## 2024-04-08 NOTE — Assessment & Plan Note (Signed)
 Followed by Ophthalmology

## 2024-04-08 NOTE — Assessment & Plan Note (Signed)
 Has not been checked recently.  Was on med in  past, no SE in past.

## 2024-04-08 NOTE — Assessment & Plan Note (Addendum)
 Chronic, has not seen ID or liver or ID  specialist in  8 years.  Will plan on considering referral to specialist for management and assessment of cirrhosis... referral started.

## 2024-04-09 DIAGNOSIS — F1120 Opioid dependence, uncomplicated: Secondary | ICD-10-CM | POA: Diagnosis not present

## 2024-04-14 DIAGNOSIS — H40023 Open angle with borderline findings, high risk, bilateral: Secondary | ICD-10-CM | POA: Diagnosis not present

## 2024-04-14 DIAGNOSIS — H25813 Combined forms of age-related cataract, bilateral: Secondary | ICD-10-CM | POA: Diagnosis not present

## 2024-04-23 DIAGNOSIS — F1120 Opioid dependence, uncomplicated: Secondary | ICD-10-CM | POA: Diagnosis not present

## 2024-04-26 NOTE — Progress Notes (Signed)
 [facility] - Clinic Note  05/10/2024   CHIEF COMPLAINT Patient presents for Retina Follow Up  HISTORY OF PRESENT ILLNESS: [name] is a 68 y.o. female who presents to the clinic today for:  HPI     Retina Follow Up   Patient presents with  Wet AMD.  In both eyes.  This started 6 weeks ago.  Duration of 6 weeks.  Since onset it is stable.  I, the attending physician,  performed the HPI with the patient and updated documentation appropriately.        Comments   6 week retina follow up ARMD and I'VE OS pt is reporting no vision changes noticed she has some floaters she denies any flashers      Last edited by Valdemar Rogue, MD on 05/10/2024 12:11 PM.      Referring physician: Claudene Pacific, MD [address] [place],  [place] [address]  HISTORICAL INFORMATION:  Selected notes from the MEDICAL RECORD NUMBER Referred by Dr. Vivian for concern of ARMD  LEE:  Ocular Hx- PMH-   CURRENT MEDICATIONS: Current Outpatient Medications (Ophthalmic Drugs)  Medication Sig   latanoprost  (XALATAN ) 0.005 % ophthalmic solution Place 1 drop into both eyes at bedtime.   No current facility-administered medications for this visit. (Ophthalmic Drugs)   Current Outpatient Medications (Other)  Medication Sig   acetaminophen  (TYLENOL ) 500 MG tablet Take 500 mg by mouth every 6 (six) hours as needed for mild pain (pain score 1-3).   albuterol  (VENTOLIN  HFA) 108 (90 Base) MCG/ACT inhaler SMARTSIG:2 Puff(s) By Mouth Every 4 Hours   amLODipine  (NORVASC ) 5 MG tablet Take 5 mg by mouth daily.   DULoxetine  (CYMBALTA ) 30 MG capsule TAKE 1 CAPSULE BY MOUTH EVERY DAY   gabapentin  (NEURONTIN ) 300 MG capsule Take 1 capsule (300 mg total) by mouth 2 (two) times daily.   methadone  (DOLOPHINE ) 10 MG/ML solution Take 20 mLs (200 mg total) by mouth in the morning. Home med.   metoprolol  tartrate (LOPRESSOR ) 25 MG tablet Take 25 mg by mouth 2 (two) times daily.   SYMBICORT 160-4.5  MCG/ACT inhaler 1 puff 2 (two) times daily.   tiZANidine (ZANAFLEX) 2 MG tablet Take 2 mg by mouth at bedtime.   No current facility-administered medications for this visit. (Other)   REVIEW OF SYSTEMS: ROS   Positive for: Cardiovascular, Eyes Negative for: Constitutional, Gastrointestinal, Neurological, Skin, Genitourinary, Musculoskeletal, HENT, Endocrine, Respiratory, Psychiatric, Allergic/Imm, Heme/Lymph Last edited by Resa Delon ORN, COT on 05/10/2024  9:04 AM.     ALLERGIES Allergies  Allergen Reactions   Acetaminophen -Codeine Other (See Comments)    Hand get red, feels like it's on fire   Morphine  And Codeine     makes me feel like i'm on fire   Nitroglycerin      Causes a headache   Tramadol  Hcl     Gives her the shakes   Azithromycin Itching and Rash   PAST MEDICAL HISTORY Past Medical History:  Diagnosis Date   Acute hepatitis C without mention of hepatic coma(070.51)    Anemia    Arthritis    back   Carpal tunnel syndrome    Chest pain, unspecified    Chronic airway obstruction, not elsewhere classified    Chronic hepatitis C without hepatic coma (HCC) 02/09/2018   Colitis    Coronary atherosclerosis of unspecified type of vessel, native or graft    Disorders of bursae and tendons in shoulder region, unspecified    Drug-induced  skin rash 04/07/2018   GERD (gastroesophageal reflux disease)    Hardware complicating wound infection (HCC) 02/09/2018   Headache    migraines   Hepatitis    hx of hepatitis C   Infective otitis externa, unspecified    Mixed hyperlipidemia    MRSA infection 02/09/2018   Nonspecific abnormal results of thyroid function study    Other bursitis disorders    Other symptoms involving nervous and musculoskeletal systems(781.99)    Pneumonia    Seizures (HCC)    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   Thrush 04/07/2018   Past Surgical History:  Procedure Laterality Date   ABDOMINAL HYSTERECTOMY  1999   CARDIAC  CATHETERIZATION     CARDIAC CATHETERIZATION N/A 06/25/2016   Procedure: Left Heart Cath and Coronary Angiography;  Surgeon: Salena Negri, MD;  Location: [facility];  Service: Cardiovascular;  Laterality: N/A;   CESAREAN SECTION     CHOLECYSTECTOMY  2000   COLONOSCOPY     IR FLUORO GUIDED NEEDLE PLC ASPIRATION/INJECTION LOC  12/27/2017   LEFT HEART CATH AND CORONARY ANGIOGRAPHY N/A 06/06/2022   Procedure: LEFT HEART CATH AND CORONARY ANGIOGRAPHY;  Surgeon: Negri Salena, MD;  Location: [facility];  Service: Cardiovascular;  Laterality: N/A;   LUMBAR FUSION  01/13/2018   Transforaminal lumbar interbody fusion L4-5 with screws, cages and rods, local bone graft, Allograft, Vivigen, Decompression L4-5 and L5-S1   TONSILLECTOMY  1968   FAMILY HISTORY Family History  Problem Relation Age of Onset   Colonic polyp Mother    Thyroid disease Mother    COPD Mother    Macular degeneration Mother    Uterine cancer Maternal Grandmother    Crohn's disease Daughter 61       colon resection   Irritable bowel syndrome Daughter    Heart murmur Daughter        SVT   Colon cancer Neg Hx    SOCIAL HISTORY Social History   Tobacco Use   Smoking status: Former    Current packs/day: 0.00    Types: Cigarettes    Start date: 06/16/1969    Quit date: 06/16/2022    Years since quitting: 1.9   Smokeless tobacco: Never   Tobacco comments:    2 cigarettes a day  Vaping Use   Vaping status: Former   Quit date: 02/26/2022  Substance Use Topics   Alcohol use: No   Drug use: No       OPHTHALMIC EXAM:  Base Eye Exam     Visual Acuity (Snellen - Linear)       Right Left   Dist cc 20/250 -1 20/60 -2   Dist ph cc NI NI         Tonometry (Tonopen, 9:07 AM)       Right Left   Pressure 16 14         Pupils       Pupils Dark Light Shape React APD   Right PERRL 3 2 Round Brisk None   Left PERRL 3 2 Round Brisk None         Neuro/Psych     Oriented x3: Yes    Mood/Affect: Normal         Dilation     Both eyes: 2.5% Phenylephrine  @ 9:07 AM           Slit Lamp and Fundus Exam     Slit Lamp Exam       Right  Left   Lids/Lashes Dermatochalasis - upper lid Dermatochalasis - upper lid   Conjunctiva/Sclera nasal and temporal pinguecula nasal and temporal pinguecula   Cornea 2+ Punctate epithelial erosions, tear film debris 2+ Punctate epithelial erosions, tear film debris   Anterior Chamber deep and clear deep and clear   Iris Round and dilated Round and dilated   Lens 2+ Nuclear sclerosis, 2+ Cortical cataract 2+ Nuclear sclerosis, 2+ Cortical cataract   Anterior Vitreous mild syneresis mild syneresis, Posterior vitreous detachment         Fundus Exam       Right Left   Disc Pink and Sharp, +cupping Pink and Sharp, PPA   C/D Ratio 0.7 0.3   Macula Blunted foveal reflex, central subretinal fibrosis / scar with surrounding atrophy, +pigment clumping, no heme or edema Blunted foveal reflex, central CNV with pigment ring and edema, shallow SRF -- stably improved, no frank heme, RPE mottling, clumping and atrophy   Vessels attenuated, Tortuous attenuated, mild tortuosity   Periphery Attached, No heme Attached, No heme           Refraction     Wearing Rx       Sphere Cylinder Axis   Right -2.75 +1.50 121   Left -2.75 +1.50 120    Type: SVL           IMAGING AND PROCEDURES  Imaging and Procedures for 05/10/2024  OCT, Retina - OU - Both Eyes       Right Eye Quality was good. Central Foveal Thickness: 217. Progression has been stable. Findings include no IRF, no SRF, abnormal foveal contour, retinal drusen , outer retinal atrophy [facility] -- large central disciform scar; no fluid).   Left Eye Quality was good. Central Foveal Thickness: 299. Progression has been stable. Findings include no IRF, no SRF, abnormal foveal contour, retinal drusen , subretinal hyper-reflective material, choroidal neovascular membrane  (Central CNV with stable improvement in SRF and edema; persistent SRHM).   Notes *Images captured and stored on drive  Diagnosis / Impression:  Exudative ARMD OU OD: Central SRHM -- large central disciform scar; no fluid OS: Central CNV with stable improvement in SRF and edema; persistent SRHM  Clinical management:  See below  Abbreviations: NFP - Normal foveal profile. CME - cystoid macular edema. PED - pigment epithelial detachment. IRF - intraretinal fluid. SRF - subretinal fluid. EZ - ellipsoid zone. ERM - epiretinal membrane. ORA - outer retinal atrophy. ORT - outer retinal tubulation. SRHM - subretinal hyper-reflective material. IRHM - intraretinal hyper-reflective material      Intravitreal Injection, Pharmacologic Agent - OS - Left Eye       Time Out 05/10/2024. 10:02 AM. Confirmed correct patient, procedure, site, and patient consented.   Anesthesia Topical anesthesia was used. Anesthetic medications included Lidocaine  2%, Proparacaine 0.5%.   Procedure Preparation included 5% betadine to ocular surface, eyelid speculum. A (32g) needle was used.   Injection: 2 mg aflibercept  2 MG/0.05ML   Route: Intravitreal, Site: Left Eye   NDC: [id number], Lot: [id number], Expiration date: 07/13/2025, Waste: 0 mL   Post-op Post injection exam found visual acuity of at least counting fingers. The patient tolerated the procedure well. There were no complications. The patient received written and verbal post procedure care education.            ASSESSMENT/PLAN:   ICD-10-CM   1. Exudative age-related macular degeneration of both eyes with active choroidal neovascularization (HCC)  H35.3231 OCT, Retina - OU - Both Eyes  Intravitreal Injection, Pharmacologic Agent - OS - Left Eye    aflibercept  (EYLEA ) SOLN 2 mg    2. Essential hypertension  I10     3. Hypertensive retinopathy of both eyes  H35.033     4. Combined forms of age-related cataract of both eyes  H25.813       Exudative age related macular degeneration, both eyes - OD -- inactive with disciform scar - OS w/ central edema and shallow SRF - s/p IVA OS #1 (09.03.24), #2 (10.01.24), #3 (10.29.24) -- IVA resistance - s/p IVE OS #1 (12.02.24), #2 (12.30.24), #3 (02.03.25), #4 (03.17.25), #5 (05.05.25), #6 (06.16.25) - BCVA OD stable at 20/250; OS stable at 20/60  - OCT shows OD: Central SRHM -- large central disciform scar; no fluid; OS: central CNV with stable improvement in SRF and edema; persistent SRHM at 6 wks  - recommend IVE OS #7 today, 07.28.25 w/ f/u extended to 7 wks  - pt wishes to proceed with injection  - RBA of procedure discussed, questions answered - IVE informed consent obtained and signed, 12.02.24 (OS) - see procedure note - f/u in 7 wks, DFE, OCT, possible injxn   2,3. Hypertensive retinopathy OU - discussed importance of tight BP control - monitor   4. Mixed Cataract OU - The symptoms of cataract, surgical options, and treatments and risks were discussed with patient. - discussed diagnosis and progression - clear from a retina standpoint to proceed with cataract surgery when pt and surgeon are ready  - appt with Dr. Fleeta in October  Ophthalmic Meds Ordered this visit:  Meds ordered this encounter  Medications   aflibercept  (EYLEA ) SOLN 2 mg     Return in about 7 weeks (around 06/28/2024) for ex ARMD OU, Dilated Exam, OCT, Possible Injxn.  There are no Patient Instructions on file for this visit.  Explained the diagnoses, plan, and follow up with the patient and they expressed understanding.  Patient expressed understanding of the importance of proper follow up care.   This document serves as a record of services personally performed by Redell JUDITHANN Hans, MD, PhD. It was created on their behalf by Avelina Pereyra, COA an ophthalmic technician. The creation of this record is the provider's dictation and/or activities during the visit.   Electronically signed by: Avelina GORMAN Pereyra, COT  05/10/24  12:12 PM   This document serves as a record of services personally performed by Redell JUDITHANN Hans, MD, PhD. It was created on their behalf by Alan PARAS. Delores, OA an ophthalmic technician. The creation of this record is the provider's dictation and/or activities during the visit.    Electronically signed by: Alan PARAS. Delores, OA 05/10/24 12:12 PM   Redell JUDITHANN Hans, M.D., Ph.D. Diseases & Surgery of the Retina and Vitreous [facility]  I have reviewed the above documentation for accuracy and completeness, and I agree with the above. Redell JUDITHANN Hans, M.D., Ph.D. 05/10/24 12:13 PM   Abbreviations: M myopia (nearsighted); A astigmatism; H hyperopia (farsighted); P presbyopia; Mrx spectacle prescription;  CTL contact lenses; OD right eye; OS left eye; OU both eyes  XT exotropia; ET esotropia; PEK punctate epithelial keratitis; PEE punctate epithelial erosions; DES dry eye syndrome; MGD meibomian gland dysfunction; ATs artificial tears; PFAT's preservative free artificial tears; NSC nuclear sclerotic cataract; PSC posterior subcapsular cataract; ERM epi-retinal membrane; PVD posterior vitreous detachment; RD retinal detachment; DM diabetes mellitus; DR diabetic retinopathy; NPDR non-proliferative diabetic retinopathy; PDR proliferative diabetic retinopathy; CSME  clinically significant macular edema; DME diabetic macular edema; dbh dot blot hemorrhages; CWS cotton wool spot; POAG primary open angle glaucoma; C/D cup-to-disc ratio; HVF humphrey visual field; GVF goldmann visual field; OCT optical coherence tomography; IOP intraocular pressure; BRVO Branch retinal vein occlusion; CRVO central retinal vein occlusion; CRAO central retinal artery occlusion; BRAO branch retinal artery occlusion; RT retinal tear; SB scleral buckle; PPV pars plana vitrectomy; VH Vitreous hemorrhage; PRP panretinal laser photocoagulation; IVK intravitreal kenalog; VMT vitreomacular traction; MH  Macular hole;  NVD neovascularization of the disc; NVE neovascularization elsewhere; AREDS age related eye disease study; ARMD age related macular degeneration; POAG primary open angle glaucoma; EBMD epithelial/anterior basement membrane dystrophy; ACIOL anterior chamber intraocular lens; IOL intraocular lens; PCIOL posterior chamber intraocular lens; Phaco/IOL phacoemulsification with intraocular lens placement; PRK photorefractive keratectomy; LASIK laser assisted in situ keratomileusis; HTN hypertension; DM diabetes mellitus; COPD chronic obstructive pulmonary disease

## 2024-04-30 ENCOUNTER — Other Ambulatory Visit: Payer: Self-pay | Admitting: Family Medicine

## 2024-05-05 ENCOUNTER — Ambulatory Visit

## 2024-05-05 VITALS — Ht 59.0 in | Wt 188.0 lb

## 2024-05-05 DIAGNOSIS — Z0000 Encounter for general adult medical examination without abnormal findings: Secondary | ICD-10-CM

## 2024-05-05 NOTE — Patient Instructions (Signed)
 [name] , Thank you for taking time out of your busy schedule to complete your Annual Wellness Visit with me. I enjoyed our conversation and look forward to speaking with you again next year. I, as well as your care team,  appreciate your ongoing commitment to your health goals. Please review the following plan we discussed and let me know if I can assist you in the future. Your Game plan/ To Do List    Follow up Visits: Next Medicare AWV with our clinical staff: In 1 year    Have you seen your provider in the last 6 months (3 months if uncontrolled diabetes)? Yes Next Office Visit with your provider: 8//25 @ 9  Clinician Recommendations:  Aim for 30 minutes of exercise or brisk walking, 6-8 glasses of water , and 5 servings of fruits and vegetables each day.       This is a list of the screening recommended for you and due dates:  Health Maintenance  Topic Date Due   DTaP/Tdap/Td vaccine (1 - Tdap) Never done   Mammogram  Never done   Zoster (Shingles) Vaccine (1 of 2) Never done   Hepatitis B Vaccine (2 of 3 - Risk 3-dose series) 05/05/2018   DEXA scan (bone density measurement)  Never done   Colon Cancer Screening  11/20/2021   COVID-19 Vaccine (1 - 2024-25 season) Never done   Flu Shot  05/14/2024   Medicare Annual Wellness Visit  05/05/2025   Pneumococcal Vaccine for age over 9  Completed   Hepatitis C Screening  Completed   HPV Vaccine  Aged Out   Meningitis B Vaccine  Aged Out    Advanced directives: (ACP Link)Information on Advanced Care Planning can be found at [place]  Secretary of [facility] Advance Health Care Directives Advance Health Care Directives. [email or web address]   Advance Care Planning is important because it:  [x]  Makes sure you receive the medical care that is consistent with your values, goals, and preferences  [x]  It provides guidance to your family and loved ones and reduces their decisional burden about whether or not they are making the right decisions based  on your wishes.  Follow the link provided in your after visit summary or read over the paperwork we have mailed to you to help you started getting your Advance Directives in place. If you need assistance in completing these, please reach out to us  so that we can help you!  See attachments for Preventive Care and Fall Prevention Tips.

## 2024-05-05 NOTE — Progress Notes (Unsigned)
 Subjective:   [name] is a 68 y.o. who presents for a Medicare Wellness preventive visit.  As a reminder, Annual Wellness Visits don't include a physical exam, and some assessments may be limited, especially if this visit is performed virtually. We may recommend an in-person follow-up visit with your provider if needed.  Visit Complete: {VISITMETHODVS:[id number]}  {AWVVIDEO:32072}  Persons Participating in Visit: {Persons Participating in Visit:32444}  AWV Questionnaire: {AWVQuestionnaire:32338}        Objective:    There were no vitals filed for this visit. There is no height or weight on file to calculate BMI.     11/01/2023    5:43 AM 10/30/2023   10:27 AM 06/20/2022   10:27 PM 06/05/2022    4:19 PM 03/12/2022    4:00 PM 03/12/2022    6:19 AM 03/07/2022   12:27 PM  Advanced Directives  Does Patient Have a Medical Advance Directive?  No No No No No   Would patient like information on creating a medical advance directive? No - Patient declined    No - Patient declined No - Patient declined   Pre-existing out of facility DNR order (yellow form or pink MOST form)       Pink Most/Yellow Form available - Physician notified to receive inpatient order    Current Medications (verified) Outpatient Encounter Medications as of 05/05/2024  Medication Sig   acetaminophen  (TYLENOL ) 500 MG tablet Take 500 mg by mouth every 6 (six) hours as needed for mild pain (pain score 1-3).   albuterol  (VENTOLIN  HFA) 108 (90 Base) MCG/ACT inhaler SMARTSIG:2 Puff(s) By Mouth Every 4 Hours   amLODipine  (NORVASC ) 5 MG tablet Take 5 mg by mouth daily.   DULoxetine  (CYMBALTA ) 30 MG capsule TAKE 1 CAPSULE BY MOUTH EVERY DAY   gabapentin  (NEURONTIN ) 300 MG capsule Take 1 capsule (300 mg total) by mouth 2 (two) times daily.   latanoprost  (XALATAN ) 0.005 % ophthalmic solution Place 1 drop into both eyes at bedtime.   methadone  (DOLOPHINE ) 10 MG/ML solution Take 20 mLs (200 mg total) by mouth in the  morning. Home med.   metoprolol  tartrate (LOPRESSOR ) 25 MG tablet Take 25 mg by mouth 2 (two) times daily.   SYMBICORT 160-4.5 MCG/ACT inhaler 1 puff 2 (two) times daily.   tiZANidine (ZANAFLEX) 2 MG tablet Take 2 mg by mouth at bedtime.   No facility-administered encounter medications on file as of 05/05/2024.    Allergies (verified) Acetaminophen -codeine, Morphine  and codeine, Nitroglycerin , Tramadol  hcl, and Azithromycin   History: Past Medical History:  Diagnosis Date   Acute hepatitis C without mention of hepatic coma(070.51)    Anemia    Arthritis    back   Carpal tunnel syndrome    Chest pain, unspecified    Chronic airway obstruction, not elsewhere classified    Chronic hepatitis C without hepatic coma (HCC) 02/09/2018   Colitis    Coronary atherosclerosis of unspecified type of vessel, native or graft    Disorders of bursae and tendons in shoulder region, unspecified    Drug-induced skin rash 04/07/2018   GERD (gastroesophageal reflux disease)    Hardware complicating wound infection (HCC) 02/09/2018   Headache    migraines   Hepatitis    hx of hepatitis C   Infective otitis externa, unspecified    Mixed hyperlipidemia    MRSA infection 02/09/2018   Nonspecific abnormal results of thyroid function study    Other bursitis disorders    Other symptoms involving nervous and musculoskeletal  systems(781.99)    Pneumonia    Seizures (HCC)    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   Thrush 04/07/2018   Past Surgical History:  Procedure Laterality Date   ABDOMINAL HYSTERECTOMY  1999   CARDIAC CATHETERIZATION     CARDIAC CATHETERIZATION N/A 06/25/2016   Procedure: Left Heart Cath and Coronary Angiography;  Surgeon: Salena Negri, MD;  Location: [facility];  Service: Cardiovascular;  Laterality: N/A;   CESAREAN SECTION     CHOLECYSTECTOMY  2000   COLONOSCOPY     IR FLUORO GUIDED NEEDLE PLC ASPIRATION/INJECTION LOC  12/27/2017   LEFT HEART CATH AND  CORONARY ANGIOGRAPHY N/A 06/06/2022   Procedure: LEFT HEART CATH AND CORONARY ANGIOGRAPHY;  Surgeon: Negri Salena, MD;  Location: [facility];  Service: Cardiovascular;  Laterality: N/A;   LUMBAR FUSION  01/13/2018   Transforaminal lumbar interbody fusion L4-5 with screws, cages and rods, local bone graft, Allograft, Vivigen, Decompression L4-5 and L5-S1   TONSILLECTOMY  1968   Family History  Problem Relation Age of Onset   Colonic polyp Mother    Thyroid disease Mother    COPD Mother    Macular degeneration Mother    Uterine cancer Maternal Grandmother    Crohn's disease Daughter 42       colon resection   Irritable bowel syndrome Daughter    Heart murmur Daughter        SVT   Colon cancer Neg Hx    Social History   Socioeconomic History   Marital status: Legally Separated    Spouse name: Not on file   Number of children: 4   Years of education: Not on file   Highest education level: Not on file  Occupational History   Occupation: care giver  Tobacco Use   Smoking status: Former    Current packs/day: 0.00    Types: Cigarettes    Start date: 06/16/1969    Quit date: 06/16/2022    Years since quitting: 1.8   Smokeless tobacco: Never   Tobacco comments:    2 cigarettes a day  Vaping Use   Vaping status: Former   Quit date: 02/26/2022  Substance and Sexual Activity   Alcohol use: No   Drug use: No   Sexual activity: Yes    Partners: Male  Other Topics Concern   Not on file  Social History Narrative   Not on file   Social Drivers of Health   Financial Resource Strain: Not on file  Food Insecurity: No Food Insecurity (11/01/2023)   Hunger Vital Sign    Worried About Running Out of Food in the Last Year: Never true    Ran Out of Food in the Last Year: Never true  Transportation Needs: No Transportation Needs (11/01/2023)   PRAPARE - Administrator, Civil Service (Medical): No    Lack of Transportation (Non-Medical): No  Physical Activity: Not on  file  Stress: Not on file  Social Connections: Socially Isolated (11/01/2023)   Social Connection and Isolation Panel    Frequency of Communication with Friends and Family: More than three times a week    Frequency of Social Gatherings with Friends and Family: More than three times a week    Attends Religious Services: Never    Database administrator or Organizations: No    Attends Banker Meetings: Never    Marital Status: Separated    Tobacco Counseling Counseling given: Not Answered Tobacco  comments: 2 cigarettes a day    Clinical Intake:              Lab Results  Component Value Date   HGBA1C 6.3 (H) 06/06/2022   HGBA1C 5.9 (H) 11/26/2018               Activities of Daily Living ***    11/01/2023    5:43 AM 11/01/2023    5:00 AM  In your present state of health, do you have any difficulty performing the following activities:  Hearing?  0  Vision?  0  Difficulty concentrating or making decisions?  0  Doing errands, shopping? 0     Patient Care Team: Avelina Greig BRAVO, MD as PCP - General (Family Medicine) Claudene Pacific, MD as Consulting Physician (Cardiology) *** I have updated your Care Teams any recent Medical Services you may have received from other providers in the past year.     Assessment:   This is a routine wellness examination for [place].  Hearing/Vision screen No results found.   Goals Addressed   None    Depression Screen ***    04/08/2024    3:19 PM 04/07/2018    9:51 AM 03/06/2018    9:15 AM 02/09/2018    2:14 PM  PHQ 2/9 Scores  PHQ - 2 Score 3 1 0 1  PHQ- 9 Score 12       Fall Risk ***    04/08/2024    3:19 PM 04/07/2018    9:51 AM 03/06/2018    9:15 AM 02/09/2018    2:14 PM  Fall Risk   Falls in the past year? 1 Yes  No  Yes   Number falls in past yr: 1 2 or more   2 or more   Injury with Fall? 1 Yes     Risk Factor Category   High Fall Risk   High Fall Risk   Risk for fall due to :    Impaired  balance/gait;History of fall(s)   Follow up    Follow up appointment      Data saved with a previous flowsheet row definition    MEDICARE RISK AT HOME: ***    TIMED UP AND GO:  Was the test performed?  {AMBTIMEDUPGO:[phone]}  Cognitive Function: {CognitiveScreening:32337}        Immunizations Immunization History  Administered Date(s) Administered   Fluad Trivalent(High Dose 65+) 11/02/2023   Hepatitis B, ADULT 04/07/2018   Influenza Whole 07/14/2008   Influenza,inj,Quad PF,6+ Mos 11/26/2018   PNEUMOCOCCAL CONJUGATE-20 11/02/2023   Pneumococcal Polysaccharide-23 07/28/2008    Screening Tests Health Maintenance  Topic Date Due   Medicare Annual Wellness (AWV)  Never done   DTaP/Tdap/Td (1 - Tdap) Never done   MAMMOGRAM  Never done   Zoster Vaccines- Shingrix (1 of 2) Never done   Hepatitis B Vaccines (2 of 3 - Risk 3-dose series) 05/05/2018   DEXA SCAN  Never done   Colonoscopy  11/20/2021   COVID-19 Vaccine (1 - 2024-25 season) Never done   INFLUENZA VACCINE  05/14/2024   Pneumococcal Vaccine: 50+ Years  Completed   Hepatitis C Screening  Completed   HPV VACCINES  Aged Out   Meningococcal B Vaccine  Aged Out    Health Maintenance  Health Maintenance Due  Topic Date Due   Medicare Annual Wellness (AWV)  Never done   DTaP/Tdap/Td (1 - Tdap) Never done   MAMMOGRAM  Never done   Zoster Vaccines- Shingrix (1 of  2) Never done   Hepatitis B Vaccines (2 of 3 - Risk 3-dose series) 05/05/2018   DEXA SCAN  Never done   Colonoscopy  11/20/2021   COVID-19 Vaccine (1 - 2024-25 season) Never done   Health Maintenance Items Addressed: {HMMCR (Optional):30011}  Additional Screening:  Vision Screening: Recommended annual ophthalmology exams for early detection of glaucoma and other disorders of the eye. Would you like a referral to an eye doctor? {YES/NO:21197}   Dental Screening: Recommended annual dental exams for proper oral hygiene  Community Resource  Referral / Chronic Care Management: CRR required this visit?  {YES/NO:21197}  CCM required this visit?  {CCM Required [phone]}   Plan:    I have personally reviewed and noted the following in the patient's chart:   Medical and social history Use of alcohol, tobacco or illicit drugs  Current medications and supplements including opioid prescriptions. {Opioid Prescriptions:[phone]} Functional ability and status Nutritional status Physical activity Advanced directives List of other physicians Hospitalizations, surgeries, and ER visits in previous 12 months Vitals Screenings to include cognitive, depression, and falls Referrals and appointments  In addition, I have reviewed and discussed with patient certain preventive protocols, quality metrics, and best practice recommendations. A written personalized care plan for preventive services as well as general preventive health recommendations were provided to patient.   Erminio LITTIE Saris, LPN   2/76/7974   After Visit Summary: {CHL AMB AWV After Visit Summary:[phone]}  Notes: {Nurse Notes:32343}

## 2024-05-05 NOTE — Progress Notes (Unsigned)
 Subjective:   [name] is a 68 y.o. who presents for a Medicare Wellness preventive visit.  As a reminder, Annual Wellness Visits don't include a physical exam, and some assessments may be limited, especially if this visit is performed virtually. We may recommend an in-person follow-up visit with your provider if needed.  Visit Complete: Virtual I connected with  [name] on 05/05/24 by a audio enabled telemedicine application and verified that I am speaking with the correct person using two identifiers.  Patient Location: Home  Provider Location: Home Office  I discussed the limitations of evaluation and management by telemedicine. The patient expressed understanding and agreed to proceed.  Vital Signs: Because this visit was a virtual/telehealth visit, some criteria may be missing or patient reported. Any vitals not documented were not able to be obtained and vitals that have been documented are patient reported.  VideoDeclined- This patient declined Librarian, academic. Therefore the visit was completed with audio only.  Persons Participating in Visit: Patient assisted by daughter [name] .  AWV Questionnaire: No: Patient Medicare AWV questionnaire was not completed prior to this visit.  Cardiac Risk Factors include: advanced age (>6men, >26 women);dyslipidemia     Objective:    Today's Vitals   05/05/24 1610  Weight: 188 lb (85.3 kg)  Height: 4' 11 (1.499 m)   Body mass index is 37.97 kg/m.     05/05/2024    4:17 PM 11/01/2023    5:43 AM 10/30/2023   10:27 AM 06/20/2022   10:27 PM 06/05/2022    4:19 PM 03/12/2022    4:00 PM 03/12/2022    6:19 AM  Advanced Directives  Does Patient Have a Medical Advance Directive? No  No No No No No  Would patient like information on creating a medical advance directive? Yes (MAU/Ambulatory/Procedural Areas - Information given) No - Patient declined    No - Patient declined No - Patient declined     Current Medications (verified) Outpatient Encounter Medications as of 05/05/2024  Medication Sig   acetaminophen  (TYLENOL ) 500 MG tablet Take 500 mg by mouth every 6 (six) hours as needed for mild pain (pain score 1-3).   albuterol  (VENTOLIN  HFA) 108 (90 Base) MCG/ACT inhaler SMARTSIG:2 Puff(s) By Mouth Every 4 Hours   amLODipine  (NORVASC ) 5 MG tablet Take 5 mg by mouth daily.   DULoxetine  (CYMBALTA ) 30 MG capsule TAKE 1 CAPSULE BY MOUTH EVERY DAY   gabapentin  (NEURONTIN ) 300 MG capsule Take 1 capsule (300 mg total) by mouth 2 (two) times daily.   latanoprost  (XALATAN ) 0.005 % ophthalmic solution Place 1 drop into both eyes at bedtime.   methadone  (DOLOPHINE ) 10 MG/ML solution Take 20 mLs (200 mg total) by mouth in the morning. Home med.   metoprolol  tartrate (LOPRESSOR ) 25 MG tablet Take 25 mg by mouth 2 (two) times daily.   SYMBICORT 160-4.5 MCG/ACT inhaler 1 puff 2 (two) times daily.   tiZANidine (ZANAFLEX) 2 MG tablet Take 2 mg by mouth at bedtime.   No facility-administered encounter medications on file as of 05/05/2024.    Allergies (verified) Acetaminophen -codeine, Morphine  and codeine, Nitroglycerin , Tramadol  hcl, and Azithromycin   History: Past Medical History:  Diagnosis Date   Acute hepatitis C without mention of hepatic coma(070.51)    Anemia    Arthritis    back   Carpal tunnel syndrome    Chest pain, unspecified    Chronic airway obstruction, not elsewhere classified    Chronic hepatitis C without  hepatic coma (HCC) 02/09/2018   Colitis    Coronary atherosclerosis of unspecified type of vessel, native or graft    Disorders of bursae and tendons in shoulder region, unspecified    Drug-induced skin rash 04/07/2018   GERD (gastroesophageal reflux disease)    Hardware complicating wound infection (HCC) 02/09/2018   Headache    migraines   Hepatitis    hx of hepatitis C   Infective otitis externa, unspecified    Mixed hyperlipidemia    MRSA infection 02/09/2018    Nonspecific abnormal results of thyroid function study    Other bursitis disorders    Other symptoms involving nervous and musculoskeletal systems(781.99)    Pneumonia    Seizures (HCC)    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   Thrush 04/07/2018   Past Surgical History:  Procedure Laterality Date   ABDOMINAL HYSTERECTOMY  1999   CARDIAC CATHETERIZATION     CARDIAC CATHETERIZATION N/A 06/25/2016   Procedure: Left Heart Cath and Coronary Angiography;  Surgeon: Salena Negri, MD;  Location: [facility];  Service: Cardiovascular;  Laterality: N/A;   CESAREAN SECTION     CHOLECYSTECTOMY  2000   COLONOSCOPY     IR FLUORO GUIDED NEEDLE PLC ASPIRATION/INJECTION LOC  12/27/2017   LEFT HEART CATH AND CORONARY ANGIOGRAPHY N/A 06/06/2022   Procedure: LEFT HEART CATH AND CORONARY ANGIOGRAPHY;  Surgeon: Negri Salena, MD;  Location: [facility];  Service: Cardiovascular;  Laterality: N/A;   LUMBAR FUSION  01/13/2018   Transforaminal lumbar interbody fusion L4-5 with screws, cages and rods, local bone graft, Allograft, Vivigen, Decompression L4-5 and L5-S1   TONSILLECTOMY  1968   Family History  Problem Relation Age of Onset   Colonic polyp Mother    Thyroid disease Mother    COPD Mother    Macular degeneration Mother    Uterine cancer Maternal Grandmother    Crohn's disease Daughter 60       colon resection   Irritable bowel syndrome Daughter    Heart murmur Daughter        SVT   Colon cancer Neg Hx    Social History   Socioeconomic History   Marital status: Legally Separated    Spouse name: Not on file   Number of children: 4   Years of education: Not on file   Highest education level: Not on file  Occupational History   Occupation: care giver  Tobacco Use   Smoking status: Former    Current packs/day: 0.00    Types: Cigarettes    Start date: 06/16/1969    Quit date: 06/16/2022    Years since quitting: 1.8   Smokeless tobacco: Never   Tobacco comments:     2 cigarettes a day  Vaping Use   Vaping status: Former   Quit date: 02/26/2022  Substance and Sexual Activity   Alcohol use: No   Drug use: No   Sexual activity: Yes    Partners: Male  Other Topics Concern   Not on file  Social History Narrative   Not on file   Social Drivers of Health   Financial Resource Strain: Low Risk  (05/05/2024)   Overall Financial Resource Strain (CARDIA)    Difficulty of Paying Living Expenses: Not hard at all  Food Insecurity: No Food Insecurity (05/05/2024)   Hunger Vital Sign    Worried About Running Out of Food in the Last Year: Never true    Ran Out of Food in  the Last Year: Never true  Transportation Needs: No Transportation Needs (05/05/2024)   PRAPARE - Administrator, Civil Service (Medical): No    Lack of Transportation (Non-Medical): No  Physical Activity: Inactive (05/05/2024)   Exercise Vital Sign    Days of Exercise per Week: 0 days    Minutes of Exercise per Session: 0 min  Stress: Stress Concern Present (05/05/2024)   [employer] of Occupational Health - Occupational Stress Questionnaire    Feeling of Stress: To some extent  Social Connections: Socially Isolated (05/05/2024)   Social Connection and Isolation Panel    Frequency of Communication with Friends and Family: More than three times a week    Frequency of Social Gatherings with Friends and Family: Three times a week    Attends Religious Services: Never    Active Member of Clubs or Organizations: No    Attends Banker Meetings: Never    Marital Status: Separated    Tobacco Counseling Counseling given: Not Answered Tobacco comments: 2 cigarettes a day    Clinical Intake:  Pre-visit preparation completed: Yes  Pain : No/denies pain     Diabetes: No  Lab Results  Component Value Date   HGBA1C 6.3 (H) 06/06/2022   HGBA1C 5.9 (H) 11/26/2018     How often do you need to have someone help you when you read instructions, pamphlets, or  other written materials from your doctor or pharmacy?: 1 - Never  Interpreter Needed?: No  Information entered by :: Charmaine Bloodgood LPN   Activities of Daily Living     05/05/2024    4:16 PM 11/01/2023    5:43 AM  In your present state of health, do you have any difficulty performing the following activities:  Hearing? 0   Vision? 0   Difficulty concentrating or making decisions? 0   Walking or climbing stairs? 1   Dressing or bathing? 0   Doing errands, shopping? 1 0  Preparing Food and eating ? N   Using the Toilet? N   In the past six months, have you accidently leaked urine? N   Do you have problems with loss of bowel control? N   Managing your Medications? N   Managing your Finances? N   Housekeeping or managing your Housekeeping? Y     Patient Care Team: Avelina Greig BRAVO, MD as PCP - General (Family Medicine) Claudene Pacific, MD as Consulting Physician (Cardiology) Awanda [place], MD as Consulting Physician (Internal Medicine) Valdemar Rogue, MD as Consulting Physician (Ophthalmology)  I have updated your Care Teams any recent Medical Services you may have received from other providers in the past year.     Assessment:   This is a routine wellness examination for [name].  Hearing/Vision screen Hearing Screening - Comments:: Denies hearing difficulties   Vision Screening - Comments:: Wears rx glasses - up to date with routine eye exams with Dr. Valdemar     Goals Addressed             This Visit's Progress    Improve overall health         Depression Screen     05/05/2024    4:15 PM 04/08/2024    3:19 PM 04/07/2018    9:51 AM 03/06/2018    9:15 AM 02/09/2018    2:14 PM  PHQ 2/9 Scores  PHQ - 2 Score 3 3 1  0 1  PHQ- 9 Score 12 12       Fall Risk  05/05/2024    4:16 PM 04/08/2024    3:19 PM 04/07/2018    9:51 AM 03/06/2018    9:15 AM 02/09/2018    2:14 PM  Fall Risk   Falls in the past year? 1 1 Yes  No  Yes   Number falls in past yr: 1 1 2  or more   2 or  more   Injury with Fall? 1 1 Yes     Risk Factor Category    High Fall Risk   High Fall Risk   Risk for fall due to : History of fall(s);Impaired balance/gait;Impaired mobility    Impaired balance/gait;History of fall(s)   Follow up Education provided;Falls prevention discussed;Falls evaluation completed    Follow up appointment      Data saved with a previous flowsheet row definition    MEDICARE RISK AT HOME:  Medicare Risk at Home Any stairs in or around the home?: No If so, are there any without handrails?: No Home free of loose throw rugs in walkways, pet beds, electrical cords, etc?: Yes Adequate lighting in your home to reduce risk of falls?: Yes Life alert?: No Use of a cane, walker or w/c?: No Grab bars in the bathroom?: Yes Shower chair or bench in shower?: No Elevated toilet seat or a handicapped toilet?: No  TIMED UP AND GO:  Was the test performed?  No  Cognitive Function: Declined/Normal: No cognitive concerns noted by patient or family. Patient alert, oriented, able to answer questions appropriately and recall recent events. No signs of memory loss or confusion.        Immunizations Immunization History  Administered Date(s) Administered   Fluad Trivalent(High Dose 65+) 11/02/2023   Hepatitis B, ADULT 04/07/2018   Influenza Whole 07/14/2008   Influenza,inj,Quad PF,6+ Mos 11/26/2018   PNEUMOCOCCAL CONJUGATE-20 11/02/2023   Pneumococcal Polysaccharide-23 07/28/2008    Screening Tests Health Maintenance  Topic Date Due   DTaP/Tdap/Td (1 - Tdap) Never done   MAMMOGRAM  Never done   Zoster Vaccines- Shingrix (1 of 2) Never done   Hepatitis B Vaccines (2 of 3 - Risk 3-dose series) 05/05/2018   DEXA SCAN  Never done   Colonoscopy  11/20/2021   COVID-19 Vaccine (1 - 2024-25 season) Never done   INFLUENZA VACCINE  05/14/2024   Medicare Annual Wellness (AWV)  05/05/2025   Pneumococcal Vaccine: 50+ Years  Completed   Hepatitis C Screening  Completed   HPV  VACCINES  Aged Out   Meningococcal B Vaccine  Aged Out    Health Maintenance  Health Maintenance Due  Topic Date Due   DTaP/Tdap/Td (1 - Tdap) Never done   MAMMOGRAM  Never done   Zoster Vaccines- Shingrix (1 of 2) Never done   Hepatitis B Vaccines (2 of 3 - Risk 3-dose series) 05/05/2018   DEXA SCAN  Never done   Colonoscopy  11/20/2021   COVID-19 Vaccine (1 - 2024-25 season) Never done   Health Maintenance Items Addressed: Patient declines preventive screenings at this time would like information to review before considering in the future   Additional Screening:  Vision Screening: Recommended annual ophthalmology exams for early detection of glaucoma and other disorders of the eye. Would you like a referral to an eye doctor? No    Dental Screening: Recommended annual dental exams for proper oral hygiene  Community Resource Referral / Chronic Care Management: CRR required this visit?  No   CCM required this visit?  No   Plan:    I  have personally reviewed and noted the following in the patient's chart:   Medical and social history Use of alcohol, tobacco or illicit drugs  Current medications and supplements including opioid prescriptions. Patient is not currently taking opioid prescriptions. Functional ability and status Nutritional status Physical activity Advanced directives List of other physicians Hospitalizations, surgeries, and ER visits in previous 12 months Vitals Screenings to include cognitive, depression, and falls Referrals and appointments  In addition, I have reviewed and discussed with patient certain preventive protocols, quality metrics, and best practice recommendations. A written personalized care plan for preventive services as well as general preventive health recommendations were provided to patient.   Lavelle Pfeiffer [place], [place]   2/76/7974   After Visit Summary: (MyChart) Due to this being a telephonic visit, the after visit summary  with patients personalized plan was offered to patient via MyChart   Notes: Please refer to Routing Comments.

## 2024-05-07 DIAGNOSIS — F1120 Opioid dependence, uncomplicated: Secondary | ICD-10-CM | POA: Diagnosis not present

## 2024-05-10 ENCOUNTER — Encounter (INDEPENDENT_AMBULATORY_CARE_PROVIDER_SITE_OTHER): Payer: Self-pay | Admitting: Ophthalmology

## 2024-05-10 ENCOUNTER — Ambulatory Visit (INDEPENDENT_AMBULATORY_CARE_PROVIDER_SITE_OTHER): Admitting: Ophthalmology

## 2024-05-10 DIAGNOSIS — H353231 Exudative age-related macular degeneration, bilateral, with active choroidal neovascularization: Secondary | ICD-10-CM

## 2024-05-10 DIAGNOSIS — H35033 Hypertensive retinopathy, bilateral: Secondary | ICD-10-CM

## 2024-05-10 DIAGNOSIS — H25813 Combined forms of age-related cataract, bilateral: Secondary | ICD-10-CM | POA: Diagnosis not present

## 2024-05-10 DIAGNOSIS — I10 Essential (primary) hypertension: Secondary | ICD-10-CM

## 2024-05-10 MED ORDER — AFLIBERCEPT 2MG/0.05ML IZ SOLN FOR KALEIDOSCOPE
2.0000 mg | INTRAVITREAL | Status: AC | PRN
  Administered 2024-05-10: 2 mg via INTRAVITREAL

## 2024-05-13 ENCOUNTER — Other Ambulatory Visit

## 2024-05-20 ENCOUNTER — Encounter: Admitting: Family Medicine

## 2024-05-21 ENCOUNTER — Encounter: Payer: Self-pay | Admitting: Family Medicine

## 2024-05-21 ENCOUNTER — Ambulatory Visit (INDEPENDENT_AMBULATORY_CARE_PROVIDER_SITE_OTHER): Admitting: Family Medicine

## 2024-05-21 VITALS — BP 140/80 | HR 72 | Temp 98.2°F | Ht 60.0 in | Wt 190.1 lb

## 2024-05-21 DIAGNOSIS — J441 Chronic obstructive pulmonary disease with (acute) exacerbation: Secondary | ICD-10-CM

## 2024-05-21 DIAGNOSIS — B1710 Acute hepatitis C without hepatic coma: Secondary | ICD-10-CM | POA: Diagnosis not present

## 2024-05-21 DIAGNOSIS — I2510 Atherosclerotic heart disease of native coronary artery without angina pectoris: Secondary | ICD-10-CM | POA: Diagnosis not present

## 2024-05-21 DIAGNOSIS — M5441 Lumbago with sciatica, right side: Secondary | ICD-10-CM | POA: Diagnosis not present

## 2024-05-21 DIAGNOSIS — F321 Major depressive disorder, single episode, moderate: Secondary | ICD-10-CM

## 2024-05-21 DIAGNOSIS — B182 Chronic viral hepatitis C: Secondary | ICD-10-CM

## 2024-05-21 DIAGNOSIS — E782 Mixed hyperlipidemia: Secondary | ICD-10-CM | POA: Diagnosis not present

## 2024-05-21 DIAGNOSIS — Z0000 Encounter for general adult medical examination without abnormal findings: Secondary | ICD-10-CM | POA: Diagnosis not present

## 2024-05-21 DIAGNOSIS — F1120 Opioid dependence, uncomplicated: Secondary | ICD-10-CM | POA: Diagnosis not present

## 2024-05-21 DIAGNOSIS — Z1211 Encounter for screening for malignant neoplasm of colon: Secondary | ICD-10-CM

## 2024-05-21 DIAGNOSIS — G8929 Other chronic pain: Secondary | ICD-10-CM

## 2024-05-21 DIAGNOSIS — E2839 Other primary ovarian failure: Secondary | ICD-10-CM

## 2024-05-21 DIAGNOSIS — R21 Rash and other nonspecific skin eruption: Secondary | ICD-10-CM

## 2024-05-21 MED ORDER — GABAPENTIN 300 MG PO CAPS: ORAL_CAPSULE | ORAL | 5 refills | Status: DC

## 2024-05-21 MED ORDER — DULOXETINE HCL 60 MG PO CPEP: 60.0000 mg | ORAL_CAPSULE | Freq: Every day | ORAL | 5 refills | Status: DC

## 2024-05-21 MED ORDER — TRIAMCINOLONE ACETONIDE 0.5 % EX CREA
1.0000 | TOPICAL_CREAM | Freq: Two times a day (BID) | CUTANEOUS | 0 refills | Status: AC
Start: 2024-05-21 — End: ?

## 2024-05-21 NOTE — Assessment & Plan Note (Addendum)
 Referred to counseling at last OV .  Changed   to cymbalta  30 mg daily at night.    05/05/2024    4:15 PM 04/08/2024    3:19 PM 04/07/2018    9:51 AM  PHQ9 SCORE ONLY  PHQ-9 Total Score 12 12 1       Data saved with a previous flowsheet row definition    Inadequate control... will increase to Cymbalta  to 60 mg daily

## 2024-05-21 NOTE — Assessment & Plan Note (Signed)
Referred to specialist

## 2024-05-21 NOTE — Assessment & Plan Note (Signed)
 Chronic ? Due to liver issues vs dermatitis Treat with topical steroid. Labs pending.  Referral to hepatitis clinic pending.

## 2024-05-21 NOTE — Assessment & Plan Note (Signed)
 Due for re-eval.

## 2024-05-21 NOTE — Assessment & Plan Note (Signed)
 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  S/P 2 spine surgeries on lumbar spine. She has had recent flare of left  low back  with radiculopathy.  Gabapentin  300 mg BID ineffective.  INcrease gabapentin  to 300 mg ine in AM, one mid day and 2 at night.

## 2024-05-21 NOTE — Progress Notes (Signed)
 Patient ID: [name], female    DOB: 04-29-1956, 68 y.o.   MRN: [id number]  This visit was conducted in person.  BP (!) 140/80   Pulse 72   Temp 98.2 F (36.8 C) (Oral)   Ht 5' (1.524 m)   Wt 190 lb 2 oz (86.2 kg)   SpO2 94%   BMI 37.13 kg/m    CC:  Chief Complaint  Patient presents with   Annual Exam    Part 2 (MWV 05/05/24)    Subjective:   HPI: [name] is a 68 y.o. female presenting on 05/21/2024 for Annual Exam (Part 2 (MWV 05/05/24)) Here with daughter [name] who helps with her care.  The patient presents for complete physical and review of chronic health problems. He/She also has the following acute concerns today:  Chronic back pain worsened control.   The patient saw a LPN or RN for medicare wellness visit. 05/05/2024  Prevention and wellness was reviewed in detail. Note reviewed and important notes copied below in needed   GYN: S/P  Hysterectomy  [place]   She is managed on methadone  at [facility].  Hepatitis C: Has been on a antiviral in past  ID Dr. Fleeta... referral pending.. nfo provided to daughter.  Has recurrent scratching and rash on her arms.SABRA daughter says seems to be associated with there liver issue.  History of bladder laceration during hysterectomy in 1999 Had  bladder repair with donor tissue.  PNA and sepsis admission in 10/2023.. cough improved but still atypical precordial CP.   COPD, not sure about diagnosis.. former smoker. Quit in 10/2023  On Symbicort   CAD followed by Cardiology Dr. Claudene.  Chronic back pain S/P multiple  surgery x 3  Dr. Lucilla. 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 most recent 2023  Flared up pain in last 2 weeks.. has increased gabapentin  to  300 mg in AM and 2 at night.    MDD, lost mother earlier in year.  Mood poorly controlled on Cymbalta  30 mg dialy.    05/05/2024    4:15 PM 04/08/2024    3:19 PM 04/07/2018    9:51 AM  PHQ9 SCORE ONLY  PHQ-9 Total  Score 12 12 1       Data saved with a previous flowsheet row definition      04/08/2024    3:19 PM  GAD 7 : Generalized Anxiety Score  Nervous, Anxious, on Edge 2  Control/stop worrying 2  Worry too much - different things 2  Trouble relaxing 2  Restless 2  Easily annoyed or irritable 1  Afraid - awful might happen 0  Total GAD 7 Score 11  Anxiety Difficulty Somewhat difficult       Relevant past medical, surgical, family and social history reviewed and updated as indicated. Interim medical history since our last visit reviewed. Allergies and medications reviewed and updated. Outpatient Medications Prior to Visit  Medication Sig Dispense Refill   acetaminophen  (TYLENOL ) 500 MG tablet Take 500 mg by mouth every 6 (six) hours as needed for mild pain (pain score 1-3).     albuterol  (VENTOLIN  HFA) 108 (90 Base) MCG/ACT inhaler SMARTSIG:2 Puff(s) By Mouth Every 4 Hours     amLODipine  (NORVASC ) 5 MG tablet Take 5 mg by mouth daily.     DULoxetine  (CYMBALTA ) 30 MG capsule TAKE 1 CAPSULE BY MOUTH EVERY DAY 90 capsule 0   gabapentin  (NEURONTIN ) 300 MG capsule  Take 1 capsule (300 mg total) by mouth 2 (two) times daily. 60 capsule 3   latanoprost  (XALATAN ) 0.005 % ophthalmic solution Place 1 drop into both eyes at bedtime.     methadone  (DOLOPHINE ) 10 MG/ML solution Take 20 mLs (200 mg total) by mouth in the morning. Home med.     SYMBICORT 160-4.5 MCG/ACT inhaler 1 puff 2 (two) times daily.     metoprolol  tartrate (LOPRESSOR ) 25 MG tablet Take 25 mg by mouth 2 (two) times daily.     tiZANidine (ZANAFLEX) 2 MG tablet Take 2 mg by mouth at bedtime.     No facility-administered medications prior to visit.     Per HPI unless specifically indicated in ROS section below Review of Systems  Constitutional:  Positive for fatigue. Negative for fever.  HENT:  Negative for congestion.        Hoarse voice  Eyes:  Negative for pain.  Respiratory:  Negative for cough and shortness of breath.    Cardiovascular:  Positive for chest pain. Negative for palpitations and leg swelling.  Gastrointestinal:  Negative for abdominal pain.  Genitourinary:  Negative for dysuria and vaginal bleeding.  Musculoskeletal:  Positive for back pain.  Neurological:  Negative for syncope, light-headedness and headaches.  Psychiatric/Behavioral:  Positive for dysphoric mood and sleep disturbance. The patient is nervous/anxious.    Objective:  BP (!) 140/80   Pulse 72   Temp 98.2 F (36.8 C) (Oral)   Ht 5' (1.524 m)   Wt 190 lb 2 oz (86.2 kg)   SpO2 94%   BMI 37.13 kg/m   Wt Readings from Last 3 Encounters:  05/21/24 190 lb 2 oz (86.2 kg)  05/05/24 188 lb (85.3 kg)  04/08/24 188 lb 2 oz (85.3 kg)      Physical Exam Constitutional:      General: She is not in acute distress.    Appearance: Normal appearance. She is well-developed. She is not ill-appearing or toxic-appearing.  HENT:     Head: Normocephalic.     Right Ear: Hearing, tympanic membrane, ear canal and external ear normal. Tympanic membrane is not erythematous, retracted or bulging.     Left Ear: Hearing, tympanic membrane, ear canal and external ear normal. Tympanic membrane is not erythematous, retracted or bulging.     Nose: No mucosal edema or rhinorrhea.     Right Sinus: No maxillary sinus tenderness or frontal sinus tenderness.     Left Sinus: No maxillary sinus tenderness or frontal sinus tenderness.     Mouth/Throat:     Pharynx: Uvula midline.  Eyes:     General: Lids are normal. Lids are everted, no foreign bodies appreciated.     Conjunctiva/sclera: Conjunctivae normal.     Pupils: Pupils are equal, round, and reactive to light.  Neck:     Thyroid: No thyroid mass or thyromegaly.     Vascular: No carotid bruit.     Trachea: Trachea normal.  Cardiovascular:     Rate and Rhythm: Normal rate and regular rhythm.     Pulses: Normal pulses.     Heart sounds: Normal heart sounds, S1 normal and S2 normal. No murmur  heard.    No friction rub. No gallop.  Pulmonary:     Effort: Pulmonary effort is normal. No tachypnea or respiratory distress.     Breath sounds: Normal breath sounds. No decreased breath sounds, wheezing, rhonchi or rales.  Abdominal:     General: Bowel sounds are normal.  Palpations: Abdomen is soft.     Tenderness: There is no abdominal tenderness.  Musculoskeletal:     Cervical back: Normal range of motion and neck supple.  Skin:    General: Skin is warm and dry.     Findings: No rash.  Neurological:     Mental Status: She is alert.  Psychiatric:        Attention and Perception: Attention normal.        Mood and Affect: Mood is depressed. Mood is not anxious. Affect is flat.        Speech: Speech normal.        Behavior: Behavior normal. Behavior is cooperative.        Thought Content: Thought content normal.        Cognition and Memory: Cognition normal.        Judgment: Judgment normal.       Results for orders placed or performed during the hospital encounter of 10/30/23  Basic metabolic panel   Collection Time: 10/30/23 10:27 AM  Result Value Ref Range   Sodium 136 135 - 145 mmol/L   Potassium 3.7 3.5 - 5.1 mmol/L   Chloride 98 98 - 111 mmol/L   CO2 20 (L) 22 - 32 mmol/L   Glucose, Bld 89 70 - 99 mg/dL   BUN 19 8 - 23 mg/dL   Creatinine, Ser 8.94 (H) 0.44 - 1.00 mg/dL   Calcium  8.9 8.9 - 10.3 mg/dL   GFR, Estimated 58 (L) >60 mL/min   Anion gap 18 (H) 5 - 15  CBC   Collection Time: 10/30/23 10:27 AM  Result Value Ref Range   WBC 32.3 (H) 4.0 - 10.5 K/uL   RBC 5.03 3.87 - 5.11 MIL/uL   Hemoglobin 14.7 12.0 - 15.0 g/dL   HCT 54.1 63.9 - 53.9 %   MCV 91.1 80.0 - 100.0 fL   MCH 29.2 26.0 - 34.0 pg   MCHC 32.1 30.0 - 36.0 g/dL   RDW 84.7 88.4 - 84.4 %   Platelets 314 150 - 400 K/uL   nRBC 0.0 0.0 - 0.2 %  Resp panel by RT-PCR (RSV, Flu A&B, Covid) Anterior Nasal Swab   Collection Time: 10/30/23 10:28 AM   Specimen: Anterior Nasal Swab  Result Value Ref  Range   SARS Coronavirus 2 by RT PCR NEGATIVE NEGATIVE   Influenza A by PCR NEGATIVE NEGATIVE   Influenza B by PCR NEGATIVE NEGATIVE   Resp Syncytial Virus by PCR NEGATIVE NEGATIVE  Brain natriuretic peptide   Collection Time: 10/30/23 10:28 AM  Result Value Ref Range   B Natriuretic Peptide 418.7 (H) 0.0 - 100.0 pg/mL  Hepatic function panel   Collection Time: 10/30/23 10:28 AM  Result Value Ref Range   Total Protein 7.4 6.5 - 8.1 g/dL   Albumin  3.4 (L) 3.5 - 5.0 g/dL   AST 41 15 - 41 U/L   ALT 35 0 - 44 U/L   Alkaline Phosphatase 98 38 - 126 U/L   Total Bilirubin 1.2 0.0 - 1.2 mg/dL   Bilirubin, Direct 0.6 (H) 0.0 - 0.2 mg/dL   Indirect Bilirubin 0.6 0.3 - 0.9 mg/dL  Blood gas, venous   Collection Time: 10/30/23 12:23 PM  Result Value Ref Range   pH, Ven 7.41 7.25 - 7.43   pCO2, Ven 38 (L) 44 - 60 mmHg   pO2, Ven 38 32 - 45 mmHg   Bicarbonate 24.1 20.0 - 28.0 mmol/L   Acid-base deficit 0.3 0.0 -  2.0 mmol/L   O2 Saturation 70.2 %   Patient temperature 37.0    Collection site VEIN   Culture, blood (routine x 2)   Collection Time: 10/30/23 12:42 PM   Specimen: BLOOD  Result Value Ref Range   Specimen Description BLOOD LEFT AC    Special Requests      BOTTLES DRAWN AEROBIC AND ANAEROBIC Blood Culture adequate volume   Culture      NO GROWTH 5 DAYS Performed at [facility], [address] [place], [place] [address]    Report Status 11/04/2023 FINAL   Lactic acid, plasma   Collection Time: 10/30/23 12:42 PM  Result Value Ref Range   Lactic Acid, Venous 1.6 0.5 - 1.9 mmol/L  Protime-INR   Collection Time: 10/30/23 12:42 PM  Result Value Ref Range   Prothrombin Time 15.2 11.4 - 15.2 seconds   INR 1.2 0.8 - 1.2  APTT   Collection Time: 10/30/23 12:42 PM  Result Value Ref Range   aPTT 31 24 - 36 seconds  Culture, blood (routine x 2)   Collection Time: 10/30/23 12:43 PM   Specimen: BLOOD  Result Value Ref Range   Specimen Description BLOOD RIGHT AC     Special Requests      BOTTLES DRAWN AEROBIC AND ANAEROBIC Blood Culture adequate volume   Culture      NO GROWTH 5 DAYS Performed at [facility], [address] [place], [place] [address]    Report Status 11/04/2023 FINAL   Lactic acid, plasma   Collection Time: 10/30/23  1:52 PM  Result Value Ref Range   Lactic Acid, Venous 1.4 0.5 - 1.9 mmol/L  Respiratory (~20 pathogens) panel by PCR   Collection Time: 10/31/23  4:00 AM   Specimen: Nasopharyngeal Swab; Respiratory  Result Value Ref Range   Adenovirus NOT DETECTED NOT DETECTED   Coronavirus 229E NOT DETECTED NOT DETECTED   Coronavirus HKU1 NOT DETECTED NOT DETECTED   Coronavirus NL63 NOT DETECTED NOT DETECTED   Coronavirus OC43 NOT DETECTED NOT DETECTED   Metapneumovirus NOT DETECTED NOT DETECTED   Rhinovirus / Enterovirus NOT DETECTED NOT DETECTED   Influenza A NOT DETECTED NOT DETECTED   Influenza B NOT DETECTED NOT DETECTED   Parainfluenza Virus 1 NOT DETECTED NOT DETECTED   Parainfluenza Virus 2 NOT DETECTED NOT DETECTED   Parainfluenza Virus 3 NOT DETECTED NOT DETECTED   Parainfluenza Virus 4 NOT DETECTED NOT DETECTED   Respiratory Syncytial Virus NOT DETECTED NOT DETECTED   Bordetella pertussis NOT DETECTED NOT DETECTED   Bordetella Parapertussis NOT DETECTED NOT DETECTED   Chlamydophila pneumoniae NOT DETECTED NOT DETECTED   Mycoplasma pneumoniae NOT DETECTED NOT DETECTED  HIV Antibody (routine testing w rflx)   Collection Time: 10/31/23  4:37 AM  Result Value Ref Range   HIV Screen 4th Generation wRfx Non Reactive Non Reactive  Basic metabolic panel   Collection Time: 10/31/23  4:37 AM  Result Value Ref Range   Sodium 135 135 - 145 mmol/L   Potassium 3.1 (L) 3.5 - 5.1 mmol/L   Chloride 98 98 - 111 mmol/L   CO2 20 (L) 22 - 32 mmol/L   Glucose, Bld 116 (H) 70 - 99 mg/dL   BUN 30 (H) 8 - 23 mg/dL   Creatinine, Ser 9.05 0.44 - 1.00 mg/dL   Calcium  8.8 (L) 8.9 - 10.3 mg/dL   GFR, Estimated >39 >39  mL/min   Anion gap 17 (H) 5 - 15  CBC  Collection Time: 10/31/23  4:37 AM  Result Value Ref Range   WBC 25.8 (H) 4.0 - 10.5 K/uL   RBC 4.63 3.87 - 5.11 MIL/uL   Hemoglobin 13.5 12.0 - 15.0 g/dL   HCT 58.7 63.9 - 53.9 %   MCV 89.0 80.0 - 100.0 fL   MCH 29.2 26.0 - 34.0 pg   MCHC 32.8 30.0 - 36.0 g/dL   RDW 84.6 88.4 - 84.4 %   Platelets 291 150 - 400 K/uL   nRBC 0.0 0.0 - 0.2 %  Procalcitonin   Collection Time: 10/31/23  5:00 AM  Result Value Ref Range   Procalcitonin 5.80 ng/mL  Legionella Pneumophila Serogp 1 Ur Ag   Collection Time: 10/31/23 11:13 AM  Result Value Ref Range   L. pneumophila Serogp 1 Ur Ag Negative Negative   Source of Sample URINE, RANDOM   Strep pneumoniae urinary antigen   Collection Time: 10/31/23 11:13 AM  Result Value Ref Range   Strep Pneumo Urinary Antigen NEGATIVE NEGATIVE  Basic metabolic panel   Collection Time: 11/01/23  5:52 AM  Result Value Ref Range   Sodium 136 135 - 145 mmol/L   Potassium 3.3 (L) 3.5 - 5.1 mmol/L   Chloride 103 98 - 111 mmol/L   CO2 24 22 - 32 mmol/L   Glucose, Bld 114 (H) 70 - 99 mg/dL   BUN 41 (H) 8 - 23 mg/dL   Creatinine, Ser 9.11 0.44 - 1.00 mg/dL   Calcium  8.8 (L) 8.9 - 10.3 mg/dL   GFR, Estimated >39 >39 mL/min   Anion gap 9 5 - 15  CBC   Collection Time: 11/01/23  5:52 AM  Result Value Ref Range   WBC 21.5 (H) 4.0 - 10.5 K/uL   RBC 4.54 3.87 - 5.11 MIL/uL   Hemoglobin 13.4 12.0 - 15.0 g/dL   HCT 59.5 63.9 - 53.9 %   MCV 89.0 80.0 - 100.0 fL   MCH 29.5 26.0 - 34.0 pg   MCHC 33.2 30.0 - 36.0 g/dL   RDW 84.3 (H) 88.4 - 84.4 %   Platelets 321 150 - 400 K/uL   nRBC 0.0 0.0 - 0.2 %  Magnesium   Collection Time: 11/01/23  5:52 AM  Result Value Ref Range   Magnesium 2.8 (H) 1.7 - 2.4 mg/dL  Basic metabolic panel   Collection Time: 11/02/23  4:57 AM  Result Value Ref Range   Sodium 140 135 - 145 mmol/L   Potassium 3.4 (L) 3.5 - 5.1 mmol/L   Chloride 107 98 - 111 mmol/L   CO2 25 22 - 32 mmol/L    Glucose, Bld 74 70 - 99 mg/dL   BUN 26 (H) 8 - 23 mg/dL   Creatinine, Ser 9.31 0.44 - 1.00 mg/dL   Calcium  8.2 (L) 8.9 - 10.3 mg/dL   GFR, Estimated >39 >39 mL/min   Anion gap 8 5 - 15  CBC   Collection Time: 11/02/23  4:57 AM  Result Value Ref Range   WBC 15.6 (H) 4.0 - 10.5 K/uL   RBC 4.10 3.87 - 5.11 MIL/uL   Hemoglobin 12.1 12.0 - 15.0 g/dL   HCT 62.3 63.9 - 53.9 %   MCV 91.7 80.0 - 100.0 fL   MCH 29.5 26.0 - 34.0 pg   MCHC 32.2 30.0 - 36.0 g/dL   RDW 84.1 (H) 88.4 - 84.4 %   Platelets 307 150 - 400 K/uL   nRBC 0.0 0.0 - 0.2 %  Basic metabolic panel  Collection Time: 11/03/23  4:24 AM  Result Value Ref Range   Sodium 143 135 - 145 mmol/L   Potassium 4.1 3.5 - 5.1 mmol/L   Chloride 108 98 - 111 mmol/L   CO2 27 22 - 32 mmol/L   Glucose, Bld 72 70 - 99 mg/dL   BUN 22 8 - 23 mg/dL   Creatinine, Ser 9.36 0.44 - 1.00 mg/dL   Calcium  8.4 (L) 8.9 - 10.3 mg/dL   GFR, Estimated >39 >39 mL/min   Anion gap 8 5 - 15  CBC   Collection Time: 11/03/23  4:24 AM  Result Value Ref Range   WBC 15.8 (H) 4.0 - 10.5 K/uL   RBC 4.40 3.87 - 5.11 MIL/uL   Hemoglobin 12.8 12.0 - 15.0 g/dL   HCT 59.9 63.9 - 53.9 %   MCV 90.9 80.0 - 100.0 fL   MCH 29.1 26.0 - 34.0 pg   MCHC 32.0 30.0 - 36.0 g/dL   RDW 83.9 (H) 88.4 - 84.4 %   Platelets 318 150 - 400 K/uL   nRBC 0.0 0.0 - 0.2 %    Assessment and Plan The patient's preventative maintenance and recommended screening tests for an annual wellness exam were reviewed in full today. Brought up to date unless services declined.  Counselled on the importance of diet, exercise, and its role in overall health and mortality. The patient's FH and SH was reviewed, including their home life, tobacco status, and drug and alcohol status.   Vaccines: Due for tetanus, shingles, up-to-date with Prevnar 20  Pap/DVE: Not indicated after age 49 Mammo: Due Bone Density: Due.. order placed Colon: Last done in 2013, repeat in 10 years, due Smoking Status:  Former ETOH/ drug use: None/none  Hep C: positive   Routine general medical examination at a health care facility  HYPERLIPIDEMIA, MIXED, WITH LOW HDL Assessment & Plan: Due for re-eval.   HEPATITIS C Assessment & Plan: Chronic, has not seen ID or liver or ID  specialist in  8 years. Referral placed    COPD with acute exacerbation [facility]) Assessment & Plan: Not clear diagnosis with PFTs per patient.  On Symbicort, moderate control.   Chronic hepatitis C without hepatic coma (HCC) Assessment & Plan: Referred to specialist   Current moderate episode of major depressive disorder without prior episode [facility]) Assessment & Plan:  Referred to counseling at last OV .  Changed   to cymbalta  30 mg daily at night.    05/05/2024    4:15 PM 04/08/2024    3:19 PM 04/07/2018    9:51 AM  PHQ9 SCORE ONLY  PHQ-9 Total Score 12 12 1       Data saved with a previous flowsheet row definition      Coronary artery disease involving native coronary artery of native heart without angina pectoris Assessment & Plan: Chronic, followed by Cardiology.      No follow-ups on file.   Greig Ring, MD

## 2024-05-21 NOTE — Assessment & Plan Note (Signed)
 Chronic, has not seen ID or liver or ID  specialist in  8 years. Referral placed

## 2024-05-21 NOTE — Assessment & Plan Note (Signed)
 Chronic, followed by Cardiology

## 2024-05-21 NOTE — Patient Instructions (Signed)
 Please call the location of your choice from the menu below to schedule your Mammogram and/or Bone Density appointment.    [facility]   [facility]                      Phone:  [phone] [address]                               [place], [place] [address]                                                             Services: Traditional and 3D Mammogram, Bone Density   [facility]                 Phone: [phone] [address]                                                       [place], [place] [address]    Service: Bone Density ONLY   *this site does NOT perform mammograms  [facility]                        Phone:  [phone] [address]                                  [place], [place] [address]                                            Services:  3D Mammogram and Bone Density    [facility]   Phone:  [phone]   [address]                                                                            [place], [place] [address]                                            Services: 3D Mammogram and Bone [facility])  Phone:  [phone]   [address]                        [place], [place] [address]  Services:  3D Mammogram and Bone Density

## 2024-05-21 NOTE — Assessment & Plan Note (Signed)
 Not clear diagnosis with PFTs per patient.  On Symbicort, moderate control.

## 2024-06-04 DIAGNOSIS — F1120 Opioid dependence, uncomplicated: Secondary | ICD-10-CM | POA: Diagnosis not present

## 2024-06-14 ENCOUNTER — Other Ambulatory Visit: Payer: Self-pay | Admitting: Family Medicine

## 2024-06-16 NOTE — Progress Notes (Shared)
 [facility] - Clinic Note  06/28/2024   CHIEF COMPLAINT Patient presents for No chief complaint on file.  HISTORY OF PRESENT ILLNESS: [name] is a 68 y.o. female who presents to the clinic today for:     Referring physician: Avelina Greig BRAVO, MD [address] [place],  [place] [address]  HISTORICAL INFORMATION:  Selected notes from the MEDICAL RECORD NUMBER Referred by Dr. Vivian for concern of ARMD  LEE:  Ocular Hx- PMH-   CURRENT MEDICATIONS: Current Outpatient Medications (Ophthalmic Drugs)  Medication Sig   latanoprost  (XALATAN ) 0.005 % ophthalmic solution Place 1 drop into both eyes at bedtime.   No current facility-administered medications for this visit. (Ophthalmic Drugs)   Current Outpatient Medications (Other)  Medication Sig   acetaminophen  (TYLENOL ) 500 MG tablet Take 500 mg by mouth every 6 (six) hours as needed for mild pain (pain score 1-3).   albuterol  (VENTOLIN  HFA) 108 (90 Base) MCG/ACT inhaler SMARTSIG:2 Puff(s) By Mouth Every 4 Hours   amLODipine  (NORVASC ) 5 MG tablet Take 5 mg by mouth daily.   DULoxetine  (CYMBALTA ) 60 MG capsule TAKE 1 CAPSULE BY MOUTH EVERY DAY   gabapentin  (NEURONTIN ) 300 MG capsule 300 mg in in AM, one mid day and 2 at night.   methadone  (DOLOPHINE ) 10 MG/ML solution Take 20 mLs (200 mg total) by mouth in the morning. Home med.   SYMBICORT 160-4.5 MCG/ACT inhaler 1 puff 2 (two) times daily.   triamcinolone  cream (KENALOG ) 0.5 % Apply 1 Application topically 2 (two) times daily.   No current facility-administered medications for this visit. (Other)   REVIEW OF SYSTEMS:   ALLERGIES Allergies  Allergen Reactions   Acetaminophen -Codeine Other (See Comments)    Hand get red, feels like it's on fire   Morphine  And Codeine     makes me feel like i'm on fire   Nitroglycerin      Causes a headache   Tramadol  Hcl     Gives her the shakes   Azithromycin Itching and Rash   PAST MEDICAL  HISTORY Past Medical History:  Diagnosis Date   Acute hepatitis C without mention of hepatic coma(070.51)    Anemia    Arthritis    back   Carpal tunnel syndrome    Chest pain, unspecified    Chronic airway obstruction, not elsewhere classified    Chronic hepatitis C without hepatic coma (HCC) 02/09/2018   Colitis    Coronary atherosclerosis of unspecified type of vessel, native or graft    Disorders of bursae and tendons in shoulder region, unspecified    Drug-induced skin rash 04/07/2018   GERD (gastroesophageal reflux disease)    Hardware complicating wound infection (HCC) 02/09/2018   Headache    migraines   Hepatitis    hx of hepatitis C   Infective otitis externa, unspecified    Mixed hyperlipidemia    MRSA infection 02/09/2018   Nonspecific abnormal results of thyroid function study    Other bursitis disorders    Other symptoms involving nervous and musculoskeletal systems(781.99)    Pneumonia    Seizures (HCC)    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   Thrush 04/07/2018   Past Surgical History:  Procedure Laterality Date   ABDOMINAL HYSTERECTOMY  1999   CARDIAC CATHETERIZATION     CARDIAC CATHETERIZATION N/A 06/25/2016   Procedure: Left Heart Cath and Coronary Angiography;  Surgeon: Salena Negri, MD;  Location: [facility];  Service:  Cardiovascular;  Laterality: N/A;   CESAREAN SECTION     CHOLECYSTECTOMY  2000   COLONOSCOPY     IR FLUORO GUIDED NEEDLE PLC ASPIRATION/INJECTION LOC  12/27/2017   LEFT HEART CATH AND CORONARY ANGIOGRAPHY N/A 06/06/2022   Procedure: LEFT HEART CATH AND CORONARY ANGIOGRAPHY;  Surgeon: Claudene Pacific, MD;  Location: [facility];  Service: Cardiovascular;  Laterality: N/A;   LUMBAR FUSION  01/13/2018   Transforaminal lumbar interbody fusion L4-5 with screws, cages and rods, local bone graft, Allograft, Vivigen, Decompression L4-5 and L5-S1   TONSILLECTOMY  1968   FAMILY HISTORY Family History  Problem Relation  Age of Onset   Colonic polyp Mother    Thyroid disease Mother    COPD Mother    Macular degeneration Mother    Uterine cancer Maternal Grandmother    Crohn's disease Daughter 88       colon resection   Irritable bowel syndrome Daughter    Heart murmur Daughter        SVT   Colon cancer Neg Hx    SOCIAL HISTORY Social History   Tobacco Use   Smoking status: Former    Current packs/day: 0.00    Types: Cigarettes    Start date: 06/16/1969    Quit date: 06/16/2022    Years since quitting: 2.0   Smokeless tobacco: Never   Tobacco comments:    2 cigarettes a day  Vaping Use   Vaping status: Former   Quit date: 02/26/2022  Substance Use Topics   Alcohol use: No   Drug use: No       OPHTHALMIC EXAM:  Not recorded    IMAGING AND PROCEDURES  Imaging and Procedures for 06/28/2024          ASSESSMENT/PLAN:   ICD-10-CM   1. Exudative age-related macular degeneration of both eyes with active choroidal neovascularization (HCC)  H35.3231     2. Essential hypertension  I10     3. Hypertensive retinopathy of both eyes  H35.033     4. Combined forms of age-related cataract of both eyes  H25.813       Exudative age related macular degeneration, both eyes - OD -- inactive with disciform scar - OS w/ central edema and shallow SRF - s/p IVA OS #1 (09.03.24), #2 (10.01.24), #3 (10.29.24) -- IVA resistance - s/p IVE OS #1 (12.02.24), #2 (12.30.24), #3 (02.03.25), #4 (03.17.25), #5 (05.05.25), #6 (06.16.25), #7 (07.28.25) - BCVA OD stable at 20/250; OS stable at 20/60  - OCT shows OD: Central SRHM -- large central disciform scar; no fluid; OS: central CNV with stable improvement in SRF and edema; persistent SRHM at 6 wks  - recommend IVE today OS #8 (09.15.25) w/ f/u extended to 7 wks  - pt wishes to proceed with injection  - RBA of procedure discussed, questions answered - IVE informed consent obtained and signed, 12.02.24 (OS) - see procedure note - f/u in 7 wks, DFE, OCT,  possible injection   2,3. Hypertensive retinopathy OU - discussed importance of tight BP control - monitor   4. Mixed Cataract OU - The symptoms of cataract, surgical options, and treatments and risks were discussed with patient. - discussed diagnosis and progression - clear from a retina standpoint to proceed with cataract surgery when pt and surgeon are ready  - appt with Dr. Fleeta in October  Ophthalmic Meds Ordered this visit:  No orders of the defined types were placed in this encounter.    No follow-ups  on file.  There are no Patient Instructions on file for this visit.  Explained the diagnoses, plan, and follow up with the patient and they expressed understanding.  Patient expressed understanding of the importance of proper follow up care.   This document serves as a record of services personally performed by Redell JUDITHANN Hans, MD, PhD. It was created on their behalf by Avelina Pereyra, COA an ophthalmic technician. The creation of this record is the provider's dictation and/or activities during the visit.   Electronically signed by: Avelina GORMAN Pereyra, COT  06/16/24  10:52 AM      Redell JUDITHANN Hans, M.D., Ph.D. Diseases & Surgery of the Retina and Vitreous [facility]    Abbreviations: M myopia (nearsighted); A astigmatism; H hyperopia (farsighted); P presbyopia; Mrx spectacle prescription;  CTL contact lenses; OD right eye; OS left eye; OU both eyes  XT exotropia; ET esotropia; PEK punctate epithelial keratitis; PEE punctate epithelial erosions; DES dry eye syndrome; MGD meibomian gland dysfunction; ATs artificial tears; PFAT's preservative free artificial tears; NSC nuclear sclerotic cataract; PSC posterior subcapsular cataract; ERM epi-retinal membrane; PVD posterior vitreous detachment; RD retinal detachment; DM diabetes mellitus; DR diabetic retinopathy; NPDR non-proliferative diabetic retinopathy; PDR proliferative diabetic retinopathy; CSME clinically  significant macular edema; DME diabetic macular edema; dbh dot blot hemorrhages; CWS cotton wool spot; POAG primary open angle glaucoma; C/D cup-to-disc ratio; HVF humphrey visual field; GVF goldmann visual field; OCT optical coherence tomography; IOP intraocular pressure; BRVO Branch retinal vein occlusion; CRVO central retinal vein occlusion; CRAO central retinal artery occlusion; BRAO branch retinal artery occlusion; RT retinal tear; SB scleral buckle; PPV pars plana vitrectomy; VH Vitreous hemorrhage; PRP panretinal laser photocoagulation; IVK intravitreal kenalog ; VMT vitreomacular traction; MH Macular hole;  NVD neovascularization of the disc; NVE neovascularization elsewhere; AREDS age related eye disease study; ARMD age related macular degeneration; POAG primary open angle glaucoma; EBMD epithelial/anterior basement membrane dystrophy; ACIOL anterior chamber intraocular lens; IOL intraocular lens; PCIOL posterior chamber intraocular lens; Phaco/IOL phacoemulsification with intraocular lens placement; PRK photorefractive keratectomy; LASIK laser assisted in situ keratomileusis; HTN hypertension; DM diabetes mellitus; COPD chronic obstructive pulmonary disease

## 2024-06-18 DIAGNOSIS — F1120 Opioid dependence, uncomplicated: Secondary | ICD-10-CM | POA: Diagnosis not present

## 2024-06-28 ENCOUNTER — Encounter (INDEPENDENT_AMBULATORY_CARE_PROVIDER_SITE_OTHER): Admitting: Ophthalmology

## 2024-06-28 DIAGNOSIS — H353231 Exudative age-related macular degeneration, bilateral, with active choroidal neovascularization: Secondary | ICD-10-CM

## 2024-06-28 DIAGNOSIS — I10 Essential (primary) hypertension: Secondary | ICD-10-CM

## 2024-06-28 DIAGNOSIS — H35033 Hypertensive retinopathy, bilateral: Secondary | ICD-10-CM

## 2024-06-28 DIAGNOSIS — H25813 Combined forms of age-related cataract, bilateral: Secondary | ICD-10-CM

## 2024-07-02 ENCOUNTER — Encounter (INDEPENDENT_AMBULATORY_CARE_PROVIDER_SITE_OTHER): Payer: Self-pay | Admitting: Ophthalmology

## 2024-07-02 ENCOUNTER — Ambulatory Visit (INDEPENDENT_AMBULATORY_CARE_PROVIDER_SITE_OTHER): Admitting: Ophthalmology

## 2024-07-02 DIAGNOSIS — H25813 Combined forms of age-related cataract, bilateral: Secondary | ICD-10-CM

## 2024-07-02 DIAGNOSIS — H353231 Exudative age-related macular degeneration, bilateral, with active choroidal neovascularization: Secondary | ICD-10-CM | POA: Diagnosis not present

## 2024-07-02 DIAGNOSIS — H35033 Hypertensive retinopathy, bilateral: Secondary | ICD-10-CM | POA: Diagnosis not present

## 2024-07-02 DIAGNOSIS — I10 Essential (primary) hypertension: Secondary | ICD-10-CM | POA: Diagnosis not present

## 2024-07-02 MED ORDER — AFLIBERCEPT 2MG/0.05ML IZ SOLN FOR KALEIDOSCOPE
2.0000 mg | INTRAVITREAL | Status: AC | PRN
  Administered 2024-07-02: 2 mg via INTRAVITREAL

## 2024-07-02 NOTE — Progress Notes (Signed)
 [facility] - Clinic Note  07/02/2024   CHIEF COMPLAINT Patient presents for Retina Follow Up  HISTORY OF PRESENT ILLNESS: [name] is a 68 y.o. female who presents to the clinic today for:  HPI     Retina Follow Up   Patient presents with  Wet AMD.  In both eyes.  Severity is moderate.  Duration of 7.5 weeks.  Since onset it is gradually worsening.  I, the attending physician,  performed the HPI with the patient and updated documentation appropriately.        Comments   7.5 week Retina eval. Patient states she has to get 2 feet from tv to see. When patient looks at tv from distant the faces on the tv look long. Patient has a Cat Eval on Oct.1 at Dr.Hecker office. Oct.17 she believes she gets os done.      Last edited by Valdemar Rogue, MD on 07/02/2024 12:58 PM.     Patient states that she is having a hard time reading the TV and has to get real close to it to see it. She states that she is depressed.   Referring physician: Fleeta Zerita DASEN, MD [address] [place],  [place] [address]  HISTORICAL INFORMATION:  Selected notes from the MEDICAL RECORD NUMBER Referred by Dr. Vivian for concern of ARMD  LEE:  Ocular Hx- PMH-   CURRENT MEDICATIONS: Current Outpatient Medications (Ophthalmic Drugs)  Medication Sig   latanoprost  (XALATAN ) 0.005 % ophthalmic solution Place 1 drop into both eyes at bedtime.   No current facility-administered medications for this visit. (Ophthalmic Drugs)   Current Outpatient Medications (Other)  Medication Sig   acetaminophen  (TYLENOL ) 500 MG tablet Take 500 mg by mouth every 6 (six) hours as needed for mild pain (pain score 1-3).   albuterol  (VENTOLIN  HFA) 108 (90 Base) MCG/ACT inhaler SMARTSIG:2 Puff(s) By Mouth Every 4 Hours   amLODipine  (NORVASC ) 5 MG tablet Take 5 mg by mouth daily.   DULoxetine  (CYMBALTA ) 60 MG capsule TAKE 1 CAPSULE BY MOUTH EVERY DAY   gabapentin  (NEURONTIN ) 300 MG capsule 300 mg in in AM,  one mid day and 2 at night.   methadone  (DOLOPHINE ) 10 MG/ML solution Take 20 mLs (200 mg total) by mouth in the morning. Home med.   SYMBICORT 160-4.5 MCG/ACT inhaler 1 puff 2 (two) times daily.   triamcinolone  cream (KENALOG ) 0.5 % Apply 1 Application topically 2 (two) times daily.   No current facility-administered medications for this visit. (Other)   REVIEW OF SYSTEMS: ROS   Positive for: Cardiovascular, Eyes Negative for: Constitutional, Gastrointestinal, Neurological, Skin, Genitourinary, Musculoskeletal, HENT, Endocrine, Respiratory, Psychiatric, Allergic/Imm, Heme/Lymph Last edited by German Olam BRAVO, COT on 07/02/2024  8:52 AM.     ALLERGIES Allergies  Allergen Reactions   Acetaminophen -Codeine Other (See Comments)    Hand get red, feels like it's on fire   Morphine  And Codeine     makes me feel like i'm on fire   Nitroglycerin      Causes a headache   Tramadol  Hcl     Gives her the shakes   Azithromycin Itching and Rash   PAST MEDICAL HISTORY Past Medical History:  Diagnosis Date   Acute hepatitis C without mention of hepatic coma(070.51)    Anemia    Arthritis    back   Carpal tunnel syndrome    Chest pain, unspecified    Chronic airway obstruction, not elsewhere classified    Chronic hepatitis C without  hepatic coma (HCC) 02/09/2018   Colitis    Coronary atherosclerosis of unspecified type of vessel, native or graft    Disorders of bursae and tendons in shoulder region, unspecified    Drug-induced skin rash 04/07/2018   GERD (gastroesophageal reflux disease)    Hardware complicating wound infection 02/09/2018   Headache    migraines   Hepatitis    hx of hepatitis C   Infective otitis externa, unspecified    Mixed hyperlipidemia    MRSA infection 02/09/2018   Nonspecific abnormal results of thyroid function study    Other bursitis disorders    Other symptoms involving nervous and musculoskeletal systems(781.99)    Pneumonia    Seizures (HCC)    none for  4 years (as of 01/2018) pseudo seizures   Thrush 04/07/2018   Past Surgical History:  Procedure Laterality Date   ABDOMINAL HYSTERECTOMY  1999   CARDIAC CATHETERIZATION     CARDIAC CATHETERIZATION N/A 06/25/2016   Procedure: Left Heart Cath and Coronary Angiography;  Surgeon: Salena Negri, MD;  Location: [facility];  Service: Cardiovascular;  Laterality: N/A;   CESAREAN SECTION     CHOLECYSTECTOMY  2000   COLONOSCOPY     IR FLUORO GUIDED NEEDLE PLC ASPIRATION/INJECTION LOC  12/27/2017   LEFT HEART CATH AND CORONARY ANGIOGRAPHY N/A 06/06/2022   Procedure: LEFT HEART CATH AND CORONARY ANGIOGRAPHY;  Surgeon: Negri Salena, MD;  Location: [facility];  Service: Cardiovascular;  Laterality: N/A;   LUMBAR FUSION  01/13/2018   Transforaminal lumbar interbody fusion L4-5 with screws, cages and rods, local bone graft, Allograft, Vivigen, Decompression L4-5 and L5-S1   TONSILLECTOMY  1968   FAMILY HISTORY Family History  Problem Relation Age of Onset   Colonic polyp Mother    Thyroid disease Mother    COPD Mother    Macular degeneration Mother    Uterine cancer Maternal Grandmother    Crohn's disease Daughter 39       colon resection   Irritable bowel syndrome Daughter    Heart murmur Daughter        SVT   Colon cancer Neg Hx    SOCIAL HISTORY Social History   Tobacco Use   Smoking status: Former    Current packs/day: 0.00    Types: Cigarettes    Start date: 06/16/1969    Quit date: 06/16/2022    Years since quitting: 2.0   Smokeless tobacco: Never   Tobacco comments:    2 cigarettes a day  Vaping Use   Vaping status: Former   Quit date: 02/26/2022  Substance Use Topics   Alcohol use: No   Drug use: No       OPHTHALMIC EXAM:  Base Eye Exam     Visual Acuity (Snellen - Linear)       Right Left   Dist cc 20200 -2 20/80   Dist ph cc 20/NI 20/60 -2    Correction: Glasses         Tonometry (Tonopen, 9:02 AM)       Right Left   Pressure 16 12          Pupils       Dark Light Shape React APD   Right 3 2 Round Brisk None   Left 3 2 Round Brisk None         Visual Fields (Counting fingers)       Left Right    Full Full  Extraocular Movement       Right Left    Full, Ortho Full, Ortho         Neuro/Psych     Oriented x3: Yes   Mood/Affect: Normal         Dilation     Both eyes: 1.0% Mydriacyl, 2.5% Phenylephrine  @ 9:02 AM           Slit Lamp and Fundus Exam     Slit Lamp Exam       Right Left   Lids/Lashes Dermatochalasis - upper lid Dermatochalasis - upper lid   Conjunctiva/Sclera nasal and temporal pinguecula nasal and temporal pinguecula   Cornea 2+ Punctate epithelial erosions, tear film debris 2+ Punctate epithelial erosions, tear film debris   Anterior Chamber deep and clear deep and clear   Iris Round and dilated Round and dilated   Lens 2-3+ Nuclear sclerosis, 2-3+ Cortical cataract 2+ Nuclear sclerosis, 2+ Cortical cataract   Anterior Vitreous mild syneresis mild syneresis, Posterior vitreous detachment         Fundus Exam       Right Left   Disc Pink and Sharp, +cupping Pink and Sharp, PPA   C/D Ratio 0.7 0.3   Macula Blunted foveal reflex, central subretinal fibrosis / scar with surrounding atrophy, +pigment clumping, no heme or edema Blunted foveal reflex, central CNV with pigment ring and edema, shallow SRF -- stably improved, no frank heme, RPE mottling, clumping and atrophy   Vessels attenuated, Tortuous attenuated, mild tortuosity   Periphery Attached, No heme Attached, No heme           Refraction     Wearing Rx       Sphere Cylinder Axis   Right -2.75 +1.50 121   Left -2.75 +1.50 120    Type: SVL           IMAGING AND PROCEDURES  Imaging and Procedures for 07/02/2024  OCT, Retina - OU - Both Eyes       Right Eye Quality was good. Central Foveal Thickness: 277. Progression has been stable. Findings include no IRF, no SRF, abnormal foveal  contour, retinal drusen , outer retinal atrophy [facility] -- large central disciform scar; no fluid).   Left Eye Quality was good. Central Foveal Thickness: 337. Progression has been stable. Findings include no IRF, no SRF, abnormal foveal contour, retinal drusen , subretinal hyper-reflective material, choroidal neovascular membrane (Central CNV with stable improvement in SRF and edema; persistent SRHM).   Notes *Images captured and stored on drive  Diagnosis / Impression:  Exudative ARMD OU OD: Central SRHM -- large central disciform scar; no fluid OS: Central CNV with stable improvement in SRF and edema; persistent SRHM  Clinical management:  See below  Abbreviations: NFP - Normal foveal profile. CME - cystoid macular edema. PED - pigment epithelial detachment. IRF - intraretinal fluid. SRF - subretinal fluid. EZ - ellipsoid zone. ERM - epiretinal membrane. ORA - outer retinal atrophy. ORT - outer retinal tubulation. SRHM - subretinal hyper-reflective material. IRHM - intraretinal hyper-reflective material      Intravitreal Injection, Pharmacologic Agent - OS - Left Eye       Time Out 07/02/2024. 10:24 AM. Confirmed correct patient, procedure, site, and patient consented.   Anesthesia Topical anesthesia was used. Anesthetic medications included Lidocaine  2%, Proparacaine 0.5%.   Procedure Preparation included 5% betadine to ocular surface, eyelid speculum. A (32g) needle was used.   Injection: 2 mg aflibercept  2 MG/0.05ML   Route: Intravitreal,  Site: Left Eye   NDC: [id number], Lot: [id number], Expiration date: 08/13/2025, Waste: 0 mL   Post-op Post injection exam found visual acuity of at least counting fingers. The patient tolerated the procedure well. There were no complications. The patient received written and verbal post procedure care education.             ASSESSMENT/PLAN:   ICD-10-CM   1. Exudative age-related macular degeneration of both eyes with  active choroidal neovascularization (HCC)  H35.3231 OCT, Retina - OU - Both Eyes    Intravitreal Injection, Pharmacologic Agent - OS - Left Eye    aflibercept  (EYLEA ) SOLN 2 mg    2. Essential hypertension  I10     3. Hypertensive retinopathy of both eyes  H35.033     4. Combined forms of age-related cataract of both eyes  H25.813      Exudative age related macular degeneration, both eyes - OD -- inactive with disciform scar - OS w/ central edema and shallow SRF - s/p IVA OS #1 (09.03.24), #2 (10.01.24), #3 (10.29.24) -- IVA resistance =================== - s/p IVE OS #1 (12.02.24), #2 (12.30.24), #3 (02.03.25), #4 (03.17.25), #5 (05.05.25), #6 (06.16.25), #7 (07.28.25) - BCVA OD 20/200 from 20/250; OS stable at 20/60 - OCT shows OD: Central SRHM -- large central disciform scar; no fluid; OS: central CNV with stable improvement in SRF and edema; persistent SRHM at 7 wks  - recommend IVE OS #8 today, 09.19.25 w/ f/u extended to 8 wks  - pt wishes to proceed with injection  - RBA of procedure discussed, questions answered - IVE informed consent obtained and signed, 12.02.24 (OS) - see procedure note - f/u in 8 wks, DFE, OCT, possible injxn   2,3. Hypertensive retinopathy OU - discussed importance of tight BP control - monitor   4. Mixed Cataract OU - The symptoms of cataract, surgical options, and treatments and risks were discussed with patient. - discussed diagnosis and progression - clear from a retina standpoint to proceed with cataract surgery when pt and surgeon are ready  - scheduled cataract sx 10.17.25 w/ Dr. Fleeta   Ophthalmic Meds Ordered this visit:  Meds ordered this encounter  Medications   aflibercept  (EYLEA ) SOLN 2 mg     Return in about 8 weeks (around 08/27/2024) for f/u, Ex. AMD, DFE, OCT, Possible, IVE, OS.  There are no Patient Instructions on file for this visit.  Explained the diagnoses, plan, and follow up with the patient and they expressed  understanding.  Patient expressed understanding of the importance of proper follow up care.   This document serves as a record of services personally performed by Redell JUDITHANN Hans, MD, PhD. It was created on their behalf by Wanda GEANNIE Keens, COT an ophthalmic technician. The creation of this record is the provider's dictation and/or activities during the visit.    Electronically signed by:  Wanda GEANNIE Keens, COT  07/10/24 5:15 PM  Redell JUDITHANN Hans, M.D., Ph.D. Diseases & Surgery of the Retina and Vitreous [facility]  I have reviewed the above documentation for accuracy and completeness, and I agree with the above. Redell JUDITHANN Hans, M.D., Ph.D. 07/10/24 5:18 PM   Abbreviations: M myopia (nearsighted); A astigmatism; H hyperopia (farsighted); P presbyopia; Mrx spectacle prescription;  CTL contact lenses; OD right eye; OS left eye; OU both eyes  XT exotropia; ET esotropia; PEK punctate epithelial keratitis; PEE punctate epithelial erosions; DES dry eye syndrome; MGD meibomian gland dysfunction; ATs artificial tears;  PFAT's preservative free artificial tears; NSC nuclear sclerotic cataract; PSC posterior subcapsular cataract; ERM epi-retinal membrane; PVD posterior vitreous detachment; RD retinal detachment; DM diabetes mellitus; DR diabetic retinopathy; NPDR non-proliferative diabetic retinopathy; PDR proliferative diabetic retinopathy; CSME clinically significant macular edema; DME diabetic macular edema; dbh dot blot hemorrhages; CWS cotton wool spot; POAG primary open angle glaucoma; C/D cup-to-disc ratio; HVF humphrey visual field; GVF goldmann visual field; OCT optical coherence tomography; IOP intraocular pressure; BRVO Branch retinal vein occlusion; CRVO central retinal vein occlusion; CRAO central retinal artery occlusion; BRAO branch retinal artery occlusion; RT retinal tear; SB scleral buckle; PPV pars plana vitrectomy; VH Vitreous hemorrhage; PRP panretinal laser  photocoagulation; IVK intravitreal kenalog ; VMT vitreomacular traction; MH Macular hole;  NVD neovascularization of the disc; NVE neovascularization elsewhere; AREDS age related eye disease study; ARMD age related macular degeneration; POAG primary open angle glaucoma; EBMD epithelial/anterior basement membrane dystrophy; ACIOL anterior chamber intraocular lens; IOL intraocular lens; PCIOL posterior chamber intraocular lens; Phaco/IOL phacoemulsification with intraocular lens placement; PRK photorefractive keratectomy; LASIK laser assisted in situ keratomileusis; HTN hypertension; DM diabetes mellitus; COPD chronic obstructive pulmonary disease

## 2024-07-14 DIAGNOSIS — H353221 Exudative age-related macular degeneration, left eye, with active choroidal neovascularization: Secondary | ICD-10-CM | POA: Diagnosis not present

## 2024-07-14 DIAGNOSIS — H25811 Combined forms of age-related cataract, right eye: Secondary | ICD-10-CM | POA: Diagnosis not present

## 2024-07-14 DIAGNOSIS — H353212 Exudative age-related macular degeneration, right eye, with inactive choroidal neovascularization: Secondary | ICD-10-CM | POA: Diagnosis not present

## 2024-07-14 DIAGNOSIS — H524 Presbyopia: Secondary | ICD-10-CM | POA: Diagnosis not present

## 2024-07-14 DIAGNOSIS — H25813 Combined forms of age-related cataract, bilateral: Secondary | ICD-10-CM | POA: Diagnosis not present

## 2024-07-14 DIAGNOSIS — H25812 Combined forms of age-related cataract, left eye: Secondary | ICD-10-CM | POA: Diagnosis not present

## 2024-07-16 DIAGNOSIS — F1120 Opioid dependence, uncomplicated: Secondary | ICD-10-CM | POA: Diagnosis not present

## 2024-08-13 DIAGNOSIS — F1120 Opioid dependence, uncomplicated: Secondary | ICD-10-CM | POA: Diagnosis not present

## 2024-08-17 NOTE — Progress Notes (Signed)
 [facility] - Clinic Note  08/24/2024   CHIEF COMPLAINT Patient presents for Retina Follow Up  HISTORY OF PRESENT ILLNESS: [name] is a 68 y.o. female who presents to the clinic today for:  HPI     Retina Follow Up   Patient presents with  Wet AMD.  In both eyes.  This started 8 weeks ago.  I, the attending physician,  performed the HPI with the patient and updated documentation appropriately.        Comments   Patient here for 8 weeks retina follow up for exu ARMD OU. Patient states vision doint the same. OS burns and hurts all the time. Burning now. Has cataract surgery scheduled for Nov 18th OS.       Last edited by Valdemar Rogue, MD on 08/25/2024  2:14 AM.    Patient states she's doing fine, scheduled w/ Dr. Fleeta for cataract surgery OS 11.18.25. OD burns a lot, pt uses OTC drops.    Referring physician: Avelina Greig BRAVO, MD [address] [place],  [place] [address]  HISTORICAL INFORMATION:  Selected notes from the MEDICAL RECORD NUMBER Referred by Dr. Vivian for concern of ARMD  LEE:  Ocular Hx- PMH-   CURRENT MEDICATIONS: Current Outpatient Medications (Ophthalmic Drugs)  Medication Sig   latanoprost  (XALATAN ) 0.005 % ophthalmic solution Place 1 drop into both eyes at bedtime.   No current facility-administered medications for this visit. (Ophthalmic Drugs)   Current Outpatient Medications (Other)  Medication Sig   acetaminophen  (TYLENOL ) 500 MG tablet Take 500 mg by mouth every 6 (six) hours as needed for mild pain (pain score 1-3).   albuterol  (VENTOLIN  HFA) 108 (90 Base) MCG/ACT inhaler SMARTSIG:2 Puff(s) By Mouth Every 4 Hours   amLODipine  (NORVASC ) 5 MG tablet Take 5 mg by mouth daily.   DULoxetine  (CYMBALTA ) 60 MG capsule TAKE 1 CAPSULE BY MOUTH EVERY DAY   gabapentin  (NEURONTIN ) 300 MG capsule 300 mg in in AM, one mid day and 2 at night.   methadone  (DOLOPHINE ) 10 MG/ML solution Take 20 mLs (200 mg total) by mouth in the  morning. Home med.   SYMBICORT 160-4.5 MCG/ACT inhaler 1 puff 2 (two) times daily.   triamcinolone  cream (KENALOG ) 0.5 % Apply 1 Application topically 2 (two) times daily.   No current facility-administered medications for this visit. (Other)   REVIEW OF SYSTEMS: ROS   Positive for: Cardiovascular, Eyes Negative for: Constitutional, Gastrointestinal, Neurological, Skin, Genitourinary, Musculoskeletal, HENT, Endocrine, Respiratory, Psychiatric, Allergic/Imm, Heme/Lymph Last edited by Orval Asberry RAMAN, COA on 08/24/2024  8:39 AM.      ALLERGIES Allergies  Allergen Reactions   Acetaminophen -Codeine Other (See Comments)    Hand get red, feels like it's on fire   Morphine  And Codeine     makes me feel like i'm on fire   Nitroglycerin      Causes a headache   Tramadol  Hcl     Gives her the shakes   Azithromycin Itching and Rash   PAST MEDICAL HISTORY Past Medical History:  Diagnosis Date   Acute hepatitis C without mention of hepatic coma(070.51)    Anemia    Arthritis    back   Carpal tunnel syndrome    Chest pain, unspecified    Chronic airway obstruction, not elsewhere classified    Chronic hepatitis C without hepatic coma (HCC) 02/09/2018   Colitis    Coronary atherosclerosis of unspecified type of vessel, native or graft    Disorders  of bursae and tendons in shoulder region, unspecified    Drug-induced skin rash 04/07/2018   GERD (gastroesophageal reflux disease)    Hardware complicating wound infection 02/09/2018   Headache    migraines   Hepatitis    hx of hepatitis C   Infective otitis externa, unspecified    Mixed hyperlipidemia    MRSA infection 02/09/2018   Nonspecific abnormal results of thyroid function study    Other bursitis disorders    Other symptoms involving nervous and musculoskeletal systems(781.99)    Pneumonia    Seizures (HCC)    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   Thrush 04/07/2018   Past Surgical History:  Procedure  Laterality Date   ABDOMINAL HYSTERECTOMY  1999   CARDIAC CATHETERIZATION     CARDIAC CATHETERIZATION N/A 06/25/2016   Procedure: Left Heart Cath and Coronary Angiography;  Surgeon: Salena Negri, MD;  Location: [facility];  Service: Cardiovascular;  Laterality: N/A;   CESAREAN SECTION     CHOLECYSTECTOMY  2000   COLONOSCOPY     IR FLUORO GUIDED NEEDLE PLC ASPIRATION/INJECTION LOC  12/27/2017   LEFT HEART CATH AND CORONARY ANGIOGRAPHY N/A 06/06/2022   Procedure: LEFT HEART CATH AND CORONARY ANGIOGRAPHY;  Surgeon: Negri Salena, MD;  Location: [facility];  Service: Cardiovascular;  Laterality: N/A;   LUMBAR FUSION  01/13/2018   Transforaminal lumbar interbody fusion L4-5 with screws, cages and rods, local bone graft, Allograft, Vivigen, Decompression L4-5 and L5-S1   TONSILLECTOMY  1968   FAMILY HISTORY Family History  Problem Relation Age of Onset   Colonic polyp Mother    Thyroid disease Mother    COPD Mother    Macular degeneration Mother    Uterine cancer Maternal Grandmother    Crohn's disease Daughter 82       colon resection   Irritable bowel syndrome Daughter    Heart murmur Daughter        SVT   Colon cancer Neg Hx    SOCIAL HISTORY Social History   Tobacco Use   Smoking status: Former    Current packs/day: 0.00    Types: Cigarettes    Start date: 06/16/1969    Quit date: 06/16/2022    Years since quitting: 2.1   Smokeless tobacco: Never   Tobacco comments:    2 cigarettes a day  Vaping Use   Vaping status: Former   Quit date: 02/26/2022  Substance Use Topics   Alcohol use: No   Drug use: No       OPHTHALMIC EXAM:  Base Eye Exam     Visual Acuity (Snellen - Linear)       Right Left   Dist cc 20/200 -2 20/80 -1   Dist ph cc  20/60 -2    Correction: Glasses         Tonometry (Tonopen, 8:36 AM)       Right Left   Pressure 13 14         Pupils       Dark Light Shape React APD   Right 3 2 Round Brisk None   Left 3 2 Round  Brisk None         Visual Fields (Counting fingers)       Left Right    Full Full         Extraocular Movement       Right Left    Full, Ortho Full, Ortho  Neuro/Psych     Oriented x3: Yes   Mood/Affect: Normal         Dilation     Both eyes: 1.0% Mydriacyl, 2.5% Phenylephrine  @ 8:36 AM           Slit Lamp and Fundus Exam     Slit Lamp Exam       Right Left   Lids/Lashes Dermatochalasis - upper lid Dermatochalasis - upper lid   Conjunctiva/Sclera nasal and temporal pinguecula nasal and temporal pinguecula   Cornea 2+ Punctate epithelial erosions, tear film debris 2+ Punctate epithelial erosions, tear film debris   Anterior Chamber deep and clear deep and clear   Iris Round and dilated Round and dilated   Lens 2-3+ Nuclear sclerosis, 2-3+ Cortical cataract 2+ Nuclear sclerosis, 2+ Cortical cataract   Anterior Vitreous mild syneresis mild syneresis, Posterior vitreous detachment         Fundus Exam       Right Left   Disc Pink and Sharp, +cupping Pink and Sharp, PPA   C/D Ratio 0.7 0.3   Macula Blunted foveal reflex, central subretinal fibrosis / scar with surrounding atrophy, +pigment clumping, no heme or edema Blunted foveal reflex, central CNV with pigment ring and edema, shallow SRF -- stably improved, no frank heme, RPE mottling, clumping and atrophy   Vessels attenuated, Tortuous attenuated, mild tortuosity   Periphery Attached, No heme Attached, No heme           Refraction     Wearing Rx       Sphere Cylinder Axis   Right -2.75 +1.50 121   Left -2.75 +1.50 120    Type: SVL           IMAGING AND PROCEDURES  Imaging and Procedures for 08/24/2024  OCT, Retina - OU - Both Eyes       Right Eye Quality was good. Central Foveal Thickness: 302. Progression has been stable. Findings include no IRF, no SRF, abnormal foveal contour, retinal drusen , outer retinal atrophy [facility] -- large central disciform scar; no  fluid).   Left Eye Quality was good. Central Foveal Thickness: 305. Progression has improved. Findings include no SRF, abnormal foveal contour, retinal drusen , subretinal hyper-reflective material, choroidal neovascular membrane, intraretinal fluid (Central CNV with stable improvement in SRF, persistent cystic changes IN fovea--slightly improved, persistent SRHM).   Notes *Images captured and stored on drive  Diagnosis / Impression:  Exudative ARMD OU OD: Central SRHM -- large central disciform scar; no fluid OS: Central CNV with stable improvement in SRF, persistent cystic changes IN fovea--slightly improved, persistent SRHM  Clinical management:  See below  Abbreviations: NFP - Normal foveal profile. CME - cystoid macular edema. PED - pigment epithelial detachment. IRF - intraretinal fluid. SRF - subretinal fluid. EZ - ellipsoid zone. ERM - epiretinal membrane. ORA - outer retinal atrophy. ORT - outer retinal tubulation. SRHM - subretinal hyper-reflective material. IRHM - intraretinal hyper-reflective material      Intravitreal Injection, Pharmacologic Agent - OS - Left Eye       Time Out 08/24/2024. 8:03 AM. Confirmed correct patient, procedure, site, and patient consented.   Anesthesia Topical anesthesia was used. Anesthetic medications included Lidocaine  2%, Proparacaine 0.5%.   Procedure Preparation included 5% betadine to ocular surface, eyelid speculum. A (32g) needle was used.   Injection: 2 mg aflibercept  2 MG/0.05ML   Route: Intravitreal, Site: Left Eye   NDC: [id number], Lot: [id number], Expiration date: 11/12/2025, Waste: 0 mL   Post-op  Post injection exam found visual acuity of at least counting fingers. The patient tolerated the procedure well. There were no complications. The patient received written and verbal post procedure care education.           ASSESSMENT/PLAN:   ICD-10-CM   1. Exudative age-related macular degeneration of both eyes with active  choroidal neovascularization (HCC)  H35.3231 OCT, Retina - OU - Both Eyes    Intravitreal Injection, Pharmacologic Agent - OS - Left Eye    aflibercept  (EYLEA ) SOLN 2 mg    2. Essential hypertension  I10     3. Hypertensive retinopathy of both eyes  H35.033     4. Combined forms of age-related cataract of both eyes  H25.813      Exudative age related macular degeneration, both eyes - OD -- inactive with disciform scar - OS w/ central edema and shallow SRF - s/p IVA OS #1 (09.03.24), #2 (10.01.24), #3 (10.29.24) -- IVA resistance =================== - s/p IVE OS #1 (12.02.24), #2 (12.30.24), #3 (02.03.25), #4 (03.17.25), #5 (05.05.25), #6 (06.16.25), #7 (07.28.25), #8 (09.19.25) - BCVA OD 20/200-stable; OS stable at 20/60 - OCT shows OD: Central SRHM -- large central disciform scar; no fluid; OS: Central CNV with stable improvement in SRF, persistent cystic changes IN fovea--slightly improved, persistent SRHM; persistent SRHM at 8 wks  - recommend IVE OS #9 today, 11.11.25 w/ f/u in 8 wks  - pt wishes to proceed with injection  - RBA of procedure discussed, questions answered - IVE informed consent obtained and signed, 12.02.24 (OS) - see procedure note - f/u in 8 wks, DFE, OCT, possible injxn   2,3. Hypertensive retinopathy OU - discussed importance of tight BP control - monitor   4. Mixed Cataract OU - The symptoms of cataract, surgical options, and treatments and risks were discussed with patient. - discussed diagnosis and progression - clear from a retina standpoint to proceed with cataract surgery when pt and surgeon are ready  - scheduled cataract sx OS 11.18.25 w/ Dr. Fleeta    Ophthalmic Meds Ordered this visit:  Meds ordered this encounter  Medications   aflibercept  (EYLEA ) SOLN 2 mg     Return in about 8 weeks (around 10/19/2024) for exu ARMD OU, DFE, OCT, Possible Injxn.  There are no Patient Instructions on file for this visit.  Explained the diagnoses, plan, and  follow up with the patient and they expressed understanding.  Patient expressed understanding of the importance of proper follow up care.   This document serves as a record of services personally performed by Redell JUDITHANN Hans, MD, PhD. It was created on their behalf by Wanda GEANNIE Keens, COT an ophthalmic technician. The creation of this record is the provider's dictation and/or activities during the visit.    Electronically signed by:  Wanda GEANNIE Keens, COT  08/25/24 2:15 AM  This document serves as a record of services personally performed by Redell JUDITHANN Hans, MD, PhD. It was created on their behalf by Almetta Pesa, an ophthalmic technician. The creation of this record is the provider's dictation and/or activities during the visit.    Electronically signed by: Almetta Pesa, OA, 08/25/24  2:15 AM  Redell JUDITHANN Hans, M.D., Ph.D. Diseases & Surgery of the Retina and Vitreous [facility]  I have reviewed the above documentation for accuracy and completeness, and I agree with the above. Redell JUDITHANN Hans, M.D., Ph.D. 08/25/24 2:17 AM   Abbreviations: M myopia (nearsighted); A astigmatism; H hyperopia (farsighted);  P presbyopia; Mrx spectacle prescription;  CTL contact lenses; OD right eye; OS left eye; OU both eyes  XT exotropia; ET esotropia; PEK punctate epithelial keratitis; PEE punctate epithelial erosions; DES dry eye syndrome; MGD meibomian gland dysfunction; ATs artificial tears; PFAT's preservative free artificial tears; NSC nuclear sclerotic cataract; PSC posterior subcapsular cataract; ERM epi-retinal membrane; PVD posterior vitreous detachment; RD retinal detachment; DM diabetes mellitus; DR diabetic retinopathy; NPDR non-proliferative diabetic retinopathy; PDR proliferative diabetic retinopathy; CSME clinically significant macular edema; DME diabetic macular edema; dbh dot blot hemorrhages; CWS cotton wool spot; POAG primary open angle glaucoma; C/D cup-to-disc  ratio; HVF humphrey visual field; GVF goldmann visual field; OCT optical coherence tomography; IOP intraocular pressure; BRVO Branch retinal vein occlusion; CRVO central retinal vein occlusion; CRAO central retinal artery occlusion; BRAO branch retinal artery occlusion; RT retinal tear; SB scleral buckle; PPV pars plana vitrectomy; VH Vitreous hemorrhage; PRP panretinal laser photocoagulation; IVK intravitreal kenalog ; VMT vitreomacular traction; MH Macular hole;  NVD neovascularization of the disc; NVE neovascularization elsewhere; AREDS age related eye disease study; ARMD age related macular degeneration; POAG primary open angle glaucoma; EBMD epithelial/anterior basement membrane dystrophy; ACIOL anterior chamber intraocular lens; IOL intraocular lens; PCIOL posterior chamber intraocular lens; Phaco/IOL phacoemulsification with intraocular lens placement; PRK photorefractive keratectomy; LASIK laser assisted in situ keratomileusis; HTN hypertension; DM diabetes mellitus; COPD chronic obstructive pulmonary disease

## 2024-08-24 ENCOUNTER — Ambulatory Visit: Admitting: Family Medicine

## 2024-08-24 ENCOUNTER — Ambulatory Visit (INDEPENDENT_AMBULATORY_CARE_PROVIDER_SITE_OTHER): Admitting: Ophthalmology

## 2024-08-24 ENCOUNTER — Encounter (INDEPENDENT_AMBULATORY_CARE_PROVIDER_SITE_OTHER): Payer: Self-pay | Admitting: Ophthalmology

## 2024-08-24 DIAGNOSIS — I10 Essential (primary) hypertension: Secondary | ICD-10-CM | POA: Diagnosis not present

## 2024-08-24 DIAGNOSIS — H353231 Exudative age-related macular degeneration, bilateral, with active choroidal neovascularization: Secondary | ICD-10-CM | POA: Diagnosis not present

## 2024-08-24 DIAGNOSIS — H35033 Hypertensive retinopathy, bilateral: Secondary | ICD-10-CM | POA: Diagnosis not present

## 2024-08-24 DIAGNOSIS — H25813 Combined forms of age-related cataract, bilateral: Secondary | ICD-10-CM

## 2024-08-25 ENCOUNTER — Encounter (INDEPENDENT_AMBULATORY_CARE_PROVIDER_SITE_OTHER): Payer: Self-pay | Admitting: Ophthalmology

## 2024-08-25 MED ORDER — AFLIBERCEPT 2MG/0.05ML IZ SOLN FOR KALEIDOSCOPE
2.0000 mg | INTRAVITREAL | Status: AC | PRN
  Administered 2024-08-25: 2 mg via INTRAVITREAL

## 2024-08-26 ENCOUNTER — Ambulatory Visit: Admitting: Family Medicine

## 2024-08-27 ENCOUNTER — Encounter (INDEPENDENT_AMBULATORY_CARE_PROVIDER_SITE_OTHER): Admitting: Ophthalmology

## 2024-08-29 ENCOUNTER — Other Ambulatory Visit: Payer: Self-pay | Admitting: Family Medicine

## 2024-08-30 ENCOUNTER — Encounter: Payer: Self-pay | Admitting: Pharmacist

## 2024-08-30 NOTE — Telephone Encounter (Signed)
 Last office visit 05/21/24 for CPE.  Last refilled 05/21/24 for #120 with 5 refills.  Next Appt: 09/14/2024 for 3 month follow up.

## 2024-08-30 NOTE — Progress Notes (Signed)
 Pharmacy Quality Measure Review  This patient is appearing on a report for being at risk of failing the Controlling Blood Pressure measure this calendar year.    Last documented BP  BP Readings from Last 1 Encounters:  05/21/24 (!) 140/80   does not meet criteria for measure closure (BP <140/90).   Statin in Persons with CV Disease (SPC) - Fail Not prescribed statin therapy. Patient has chosen not to take in the past, reason unclear.  Lab Results  Component Value Date   LDLCALC 112 (H) 06/06/2022    2025 f/u scheduled: YES Encounter note placed **THN pt - Ensure BP resting >27min. Recheck if higher than 139/79**  Encounter note placed, statin discussion vs ICD10 exclusion   Future Appointments  Date Time Provider Department Center  09/14/2024  2:40 PM Avelina Greig BRAVO, MD [facility] [id number]  10/19/2024  8:30 AM Valdemar Rogue, MD [facility] None

## 2024-08-31 DIAGNOSIS — H268 Other specified cataract: Secondary | ICD-10-CM | POA: Diagnosis not present

## 2024-08-31 DIAGNOSIS — H25813 Combined forms of age-related cataract, bilateral: Secondary | ICD-10-CM | POA: Diagnosis not present

## 2024-08-31 DIAGNOSIS — H2512 Age-related nuclear cataract, left eye: Secondary | ICD-10-CM | POA: Diagnosis not present

## 2024-09-10 DIAGNOSIS — F1120 Opioid dependence, uncomplicated: Secondary | ICD-10-CM | POA: Diagnosis not present

## 2024-09-14 ENCOUNTER — Ambulatory Visit
Admission: RE | Admit: 2024-09-14 | Discharge: 2024-09-14 | Disposition: A | Source: Ambulatory Visit | Attending: Family Medicine | Admitting: Family Medicine

## 2024-09-14 ENCOUNTER — Ambulatory Visit: Payer: Self-pay | Admitting: Family Medicine

## 2024-09-14 ENCOUNTER — Ambulatory Visit: Admitting: Family Medicine

## 2024-09-14 ENCOUNTER — Encounter: Payer: Self-pay | Admitting: Family Medicine

## 2024-09-14 DIAGNOSIS — R739 Hyperglycemia, unspecified: Secondary | ICD-10-CM

## 2024-09-14 DIAGNOSIS — Z9049 Acquired absence of other specified parts of digestive tract: Secondary | ICD-10-CM | POA: Diagnosis not present

## 2024-09-14 DIAGNOSIS — F321 Major depressive disorder, single episode, moderate: Secondary | ICD-10-CM

## 2024-09-14 DIAGNOSIS — Z8269 Family history of other diseases of the musculoskeletal system and connective tissue: Secondary | ICD-10-CM

## 2024-09-14 DIAGNOSIS — R0781 Pleurodynia: Secondary | ICD-10-CM

## 2024-09-14 DIAGNOSIS — J9811 Atelectasis: Secondary | ICD-10-CM | POA: Diagnosis not present

## 2024-09-14 DIAGNOSIS — I10 Essential (primary) hypertension: Secondary | ICD-10-CM

## 2024-09-14 NOTE — Assessment & Plan Note (Signed)
 1 comorbidity: HTN Encouraged exercise, weight loss, healthy eating habits.

## 2024-09-14 NOTE — Progress Notes (Unsigned)
 Patient ID: [name], female    DOB: 09-Jan-1956, 68 y.o.   MRN: [id number]  This visit was conducted in person.  BP 130/72   Pulse 62   Temp 97.8 F (36.6 C) (Temporal)   Ht 5' (1.524 m)   Wt 192 lb 6 oz (87.3 kg)   SpO2 94%   BMI 37.57 kg/m    CC:  Chief Complaint  Patient presents with   Medical Management of Chronic Issues    3 month follow up    Subjective:   HPI: [name] is a 68 y.o. female presenting on 09/14/2024 for Medical Management of Chronic Issues (3 month follow up) Here with daughter [name] who helps with her care.   CAD followed by Cardiology Dr. Claudene. BP Readings from Last 3 Encounters:  09/14/24 130/72  05/21/24 (!) 140/80  04/08/24 122/80   She has been having BP spikes  153/112.. feels dizzy, has to sit down. 3 episodes in last 3 months. Has trouble speaking with these episodes.   Daughter gives a second amlodipine  5 mg daily... BP comes down and she has improved BP after 30 min.   Blood sugar has been running 173  Has also noted an episode 3 weeks ago of red hot knee.. out of the blue, tender. Treated with tylenol .  No cough... Had PNA 11/2023.. still pain with deep breathing in chest.  Chronic back pain S/P multiple  surgery x 3  Dr. Lucilla. 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 most recent 2023  gabapentin  to  300 mg in AM and 2 at night has helped.   MDD, lost mother earlier in year.  Mood poorly  at last OV changed to nighttime dosing increased to 60 mg  to try to help with fatigue. Pain is better but no improvement in mood.    09/14/2024    2:55 PM 05/05/2024    4:15 PM 04/08/2024    3:19 PM  PHQ9 SCORE ONLY  PHQ-9 Total Score 11 12  12       Data saved with a previous flowsheet row definition      09/14/2024    2:56 PM 04/08/2024    3:19 PM  GAD 7 : Generalized Anxiety Score  Nervous, Anxious, on Edge 0 2  Control/stop worrying 0 2  Worry too much - different things 0 2  Trouble relaxing 0 2   Restless 0 2  Easily annoyed or irritable 1 1  Afraid - awful might happen 0 0  Total GAD 7 Score 1 11  Anxiety Difficulty Somewhat difficult Somewhat difficult       Relevant past medical, surgical, family and social history reviewed and updated as indicated. Interim medical history since our last visit reviewed. Allergies and medications reviewed and updated. Outpatient Medications Prior to Visit  Medication Sig Dispense Refill   acetaminophen  (TYLENOL ) 500 MG tablet Take 500 mg by mouth every 6 (six) hours as needed for mild pain (pain score 1-3).     amLODipine  (NORVASC ) 5 MG tablet Take 5 mg by mouth daily.     gabapentin  (NEURONTIN ) 300 MG capsule TAKE 1 CAPSULE BY MOUTH TWICE A DAY 60 capsule 3   latanoprost  (XALATAN ) 0.005 % ophthalmic solution Place 1 drop into both eyes at bedtime.     methadone  (DOLOPHINE ) 10 MG/ML solution Take 20 mLs (200 mg total) by mouth in the morning. Home med.     SYMBICORT 160-4.5 MCG/ACT  inhaler 1 puff 2 (two) times daily.     tiZANidine (ZANAFLEX) 2 MG tablet Take 2 mg by mouth at bedtime.     triamcinolone  cream (KENALOG ) 0.5 % Apply 1 Application topically 2 (two) times daily. 30 g 0   albuterol  (VENTOLIN  HFA) 108 (90 Base) MCG/ACT inhaler SMARTSIG:2 Puff(s) By Mouth Every 4 Hours     DULoxetine  (CYMBALTA ) 60 MG capsule TAKE 1 CAPSULE BY MOUTH EVERY DAY 90 capsule 1   No facility-administered medications prior to visit.     Per HPI unless specifically indicated in ROS section below Review of Systems  Constitutional:  Positive for fatigue. Negative for fever.  HENT:  Negative for congestion.        Hoarse voice  Eyes:  Negative for pain.  Respiratory:  Negative for cough and shortness of breath.   Cardiovascular:  Positive for chest pain. Negative for palpitations and leg swelling.  Gastrointestinal:  Negative for abdominal pain.  Genitourinary:  Negative for dysuria and vaginal bleeding.  Musculoskeletal:  Positive for back pain.   Neurological:  Negative for syncope, light-headedness and headaches.  Psychiatric/Behavioral:  Positive for dysphoric mood and sleep disturbance. The patient is nervous/anxious.    Objective:  BP 130/72   Pulse 62   Temp 97.8 F (36.6 C) (Temporal)   Ht 5' (1.524 m)   Wt 192 lb 6 oz (87.3 kg)   SpO2 94%   BMI 37.57 kg/m   Wt Readings from Last 3 Encounters:  09/14/24 192 lb 6 oz (87.3 kg)  05/21/24 190 lb 2 oz (86.2 kg)  05/05/24 188 lb (85.3 kg)      Physical Exam Constitutional:      General: She is not in acute distress.    Appearance: Normal appearance. She is well-developed. She is not ill-appearing or toxic-appearing.  HENT:     Head: Normocephalic.     Right Ear: Hearing, tympanic membrane, ear canal and external ear normal. Tympanic membrane is not erythematous, retracted or bulging.     Left Ear: Hearing, tympanic membrane, ear canal and external ear normal. Tympanic membrane is not erythematous, retracted or bulging.     Nose: No mucosal edema or rhinorrhea.     Right Sinus: No maxillary sinus tenderness or frontal sinus tenderness.     Left Sinus: No maxillary sinus tenderness or frontal sinus tenderness.     Mouth/Throat:     Pharynx: Uvula midline.  Eyes:     General: Lids are normal. Lids are everted, no foreign bodies appreciated.     Conjunctiva/sclera: Conjunctivae normal.     Pupils: Pupils are equal, round, and reactive to light.  Neck:     Thyroid: No thyroid mass or thyromegaly.     Vascular: No carotid bruit.     Trachea: Trachea normal.  Cardiovascular:     Rate and Rhythm: Normal rate and regular rhythm.     Pulses: Normal pulses.     Heart sounds: Normal heart sounds, S1 normal and S2 normal. No murmur heard.    No friction rub. No gallop.  Pulmonary:     Effort: Pulmonary effort is normal. No tachypnea or respiratory distress.     Breath sounds: Normal breath sounds. No decreased breath sounds, wheezing, rhonchi or rales.  Abdominal:      General: Bowel sounds are normal.     Palpations: Abdomen is soft.     Tenderness: There is no abdominal tenderness.  Musculoskeletal:     Cervical back: Normal range of  motion and neck supple.  Skin:    General: Skin is warm and dry.     Findings: No rash.  Neurological:     Mental Status: She is alert.  Psychiatric:        Attention and Perception: Attention normal.        Mood and Affect: Mood is depressed. Mood is not anxious. Affect is flat.        Speech: Speech normal.        Behavior: Behavior normal. Behavior is cooperative.        Thought Content: Thought content normal.        Cognition and Memory: Cognition normal.        Judgment: Judgment normal.       Results for orders placed or performed in visit on 09/14/24  Comprehensive metabolic panel with GFR   Collection Time: 09/14/24  3:47 PM  Result Value Ref Range   Sodium 141 135 - 145 mEq/L   Potassium 4.8 3.5 - 5.1 mEq/L   Chloride 100 96 - 112 mEq/L   CO2 26 19 - 32 mEq/L   Glucose, Bld 104 (H) 70 - 99 mg/dL   BUN 21 6 - 23 mg/dL   Creatinine, Ser 9.05 0.40 - 1.20 mg/dL   Total Bilirubin 0.3 0.2 - 1.2 mg/dL   Alkaline Phosphatase 98 39 - 117 U/L   AST 18 0 - 37 U/L   ALT 18 0 - 35 U/L   Total Protein 7.0 6.0 - 8.3 g/dL   Albumin  4.2 3.5 - 5.2 g/dL   GFR 37.76 >39.99 mL/min   Calcium  9.4 8.4 - 10.5 mg/dL  Hemoglobin J8r   Collection Time: 09/14/24  3:47 PM  Result Value Ref Range   Hgb A1c MFr Bld 6.3 4.6 - 6.5 %  Lipid panel   Collection Time: 09/14/24  3:47 PM  Result Value Ref Range   Cholesterol 211 (H) 0 - 200 mg/dL   Triglycerides 861.9 0.0 - 149.0 mg/dL   HDL 47.09 >60.99 mg/dL   VLDL 72.3 0.0 - 59.9 mg/dL   LDL Cholesterol 869 (H) 0 - 99 mg/dL   Total CHOL/HDL Ratio 4    NonHDL 157.87   CBC with Differential/Platelet   Collection Time: 09/14/24  3:47 PM  Result Value Ref Range   WBC 11.6 (H) 4.0 - 10.5 K/uL   RBC 4.55 3.87 - 5.11 Mil/uL   Hemoglobin 12.2 12.0 - 15.0 g/dL   HCT 61.9 63.9  - 53.9 %   MCV 83.4 78.0 - 100.0 fl   MCHC 32.0 30.0 - 36.0 g/dL   RDW 82.5 (H) 88.4 - 84.4 %   Platelets 366.0 150.0 - 400.0 K/uL   Neutrophils Relative % 66.1 43.0 - 77.0 %   Lymphocytes Relative 19.7 12.0 - 46.0 %   Monocytes Relative 9.5 3.0 - 12.0 %   Eosinophils Relative 3.9 0.0 - 5.0 %   Basophils Relative 0.8 0.0 - 3.0 %   Neutro Abs 7.6 1.4 - 7.7 K/uL   Lymphs Abs 2.3 0.7 - 4.0 K/uL   Monocytes Absolute 1.1 (H) 0.1 - 1.0 K/uL   Eosinophils Absolute 0.5 0.0 - 0.7 K/uL   Basophils Absolute 0.1 0.0 - 0.1 K/uL  TSH   Collection Time: 09/14/24  3:47 PM  Result Value Ref Range   TSH 2.23 0.35 - 5.50 uIU/mL  Uric Acid   Collection Time: 09/14/24  3:47 PM  Result Value Ref Range   Uric Acid, Serum 5.6  2.4 - 7.0 mg/dL    Assessment and Plan Obesity, morbid (HCC) Assessment & Plan:  1 comorbidity: HTN Encouraged exercise, weight loss, healthy eating habits.    Current moderate episode of major depressive disorder without prior episode [facility]) Assessment & Plan:  Referred to counseling at last OV .  Improved come with increase to  cymbalta  60 mg daily at night.    09/14/2024    2:55 PM 05/05/2024    4:15 PM 04/08/2024    3:19 PM  PHQ9 SCORE ONLY  PHQ-9 Total Score 11 12  12       Data saved with a previous flowsheet row definition    Inadequate control. Consider possible increase in medication if not continuing to improve.   Malignant hypertension Assessment & Plan:  Chronic hypertension with spikes causing neurologic changes. Treated with additional amlodipine  5 mg daily.  Eval with labs for causes on recent BP increases. Possibly related to pain and stress... poor control.  Orders: -     Comprehensive metabolic panel with GFR -     Lipid panel -     CBC with Differential/Platelet -     TSH  Elevated blood sugar Assessment & Plan:  Recent increase.. eval with labs.  Orders: -     Hemoglobin A1c  Pleuritic chest pain Assessment & Plan:  Acute, unclear  cause. Eval with CXR. Former smoker.. history of COPD.  Orders: -     DG Chest 2 View  Family history of gout -     Uric acid  Chronic pain syndrome Assessment & Plan: Chronic S/P multiple  surgery x 3  Dr. Lucilla. 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 most recent 2023  gabapentin  to  300 mg in AM and 2 at night has helped      No follow-ups on file.   Greig Ring, MD

## 2024-09-15 LAB — COMPREHENSIVE METABOLIC PANEL WITH GFR
ALT: 18 U/L (ref 0–35)
AST: 18 U/L (ref 0–37)
Albumin: 4.2 g/dL (ref 3.5–5.2)
Alkaline Phosphatase: 98 U/L (ref 39–117)
BUN: 21 mg/dL (ref 6–23)
CO2: 26 meq/L (ref 19–32)
Calcium: 9.4 mg/dL (ref 8.4–10.5)
Chloride: 100 meq/L (ref 96–112)
Creatinine, Ser: 0.94 mg/dL (ref 0.40–1.20)
GFR: 62.23 mL/min (ref >60.00)
Glucose, Bld: 104 mg/dL — ABNORMAL HIGH (ref 70–99)
Potassium: 4.8 meq/L (ref 3.5–5.1)
Sodium: 141 meq/L (ref 135–145)
Total Bilirubin: 0.3 mg/dL (ref 0.2–1.2)
Total Protein: 7 g/dL (ref 6.0–8.3)

## 2024-09-15 LAB — LIPID PANEL
Cholesterol: 211 mg/dL — ABNORMAL HIGH (ref 0–200)
HDL: 52.9 mg/dL (ref >39.00)
LDL Cholesterol: 130 mg/dL — ABNORMAL HIGH (ref 0–99)
NonHDL: 157.87
Total CHOL/HDL Ratio: 4
Triglycerides: 138 mg/dL (ref 0.0–149.0)
VLDL: 27.6 mg/dL (ref 0.0–40.0)

## 2024-09-15 LAB — CBC WITH DIFFERENTIAL/PLATELET
Basophils Absolute: 0.1 K/uL (ref 0.0–0.1)
Basophils Relative: 0.8 % (ref 0.0–3.0)
Eosinophils Absolute: 0.5 K/uL (ref 0.0–0.7)
Eosinophils Relative: 3.9 % (ref 0.0–5.0)
HCT: 38 % (ref 36.0–46.0)
Hemoglobin: 12.2 g/dL (ref 12.0–15.0)
Lymphocytes Relative: 19.7 % (ref 12.0–46.0)
Lymphs Abs: 2.3 K/uL (ref 0.7–4.0)
MCHC: 32 g/dL (ref 30.0–36.0)
MCV: 83.4 fl (ref 78.0–100.0)
Monocytes Absolute: 1.1 K/uL — ABNORMAL HIGH (ref 0.1–1.0)
Monocytes Relative: 9.5 % (ref 3.0–12.0)
Neutro Abs: 7.6 K/uL (ref 1.4–7.7)
Neutrophils Relative %: 66.1 % (ref 43.0–77.0)
Platelets: 366 K/uL (ref 150.0–400.0)
RBC: 4.55 Mil/uL (ref 3.87–5.11)
RDW: 17.4 % — ABNORMAL HIGH (ref 11.5–15.5)
WBC: 11.6 K/uL — ABNORMAL HIGH (ref 4.0–10.5)

## 2024-09-15 LAB — TSH: TSH: 2.23 u[IU]/mL (ref 0.35–5.50)

## 2024-09-15 LAB — HEMOGLOBIN A1C: Hgb A1c MFr Bld: 6.3 % (ref 4.6–6.5)

## 2024-09-15 LAB — URIC ACID: Uric Acid, Serum: 5.6 mg/dL (ref 2.4–7.0)

## 2024-09-20 ENCOUNTER — Telehealth: Payer: Self-pay | Admitting: Family Medicine

## 2024-09-20 NOTE — Telephone Encounter (Unsigned)
 Copied from CRM #[id number]. Topic: Clinical - Medication Refill >> Sep 20, 2024 10:26 AM Jasmin G wrote: Medication: DULoxetine  (CYMBALTA ) 60 MG capsule  Has the patient contacted their pharmacy? No (Agent: If no, request that the patient contact the pharmacy for the refill. If patient does not wish to contact the pharmacy document the reason why and proceed with request.) (Agent: If yes, when and what did the pharmacy advise?)  This is the patient's preferred pharmacy:  [facility] [phone] [facility] [place] - [address] [place] [address] Phone: [phone] Fax: [phone]  Is this the correct pharmacy for this prescription? Yes If no, delete pharmacy and type the correct one.   Has the prescription been filled recently? Yes  Is the patient out of the medication? Yes  Has the patient been seen for an appointment in the last year OR does the patient have an upcoming appointment? Yes  Can we respond through MyChart? No  Agent: Please be advised that Rx refills may take up to 3 business days. We ask that you follow-up with your pharmacy.

## 2024-09-21 MED ORDER — DULOXETINE HCL 60 MG PO CPEP: 60.0000 mg | ORAL_CAPSULE | Freq: Every day | ORAL | 1 refills | Status: AC

## 2024-09-23 ENCOUNTER — Other Ambulatory Visit: Payer: Self-pay | Admitting: Family Medicine

## 2024-09-23 NOTE — Telephone Encounter (Signed)
 Copied from CRM #[id number]. Topic: Clinical - Medication Refill >> Sep 23, 2024  7:43 AM Pinkey ORN wrote: Medication: albuterol  (VENTOLIN  HFA) 108 (90 Base) MCG/ACT inhaler  Has the patient contacted their pharmacy? Yes (Agent: If no, request that the patient contact the pharmacy for the refill. If patient does not wish to contact the pharmacy document the reason why and proceed with request.) (Agent: If yes, when and what did the pharmacy advise?)  This is the patient's preferred pharmacy:  [facility] [phone] [facility] [place] - [address] [place] [address] Phone: [phone] Fax: [phone]  Is this the correct pharmacy for this prescription? Yes If no, delete pharmacy and type the correct one.   Has the prescription been filled recently? No  Is the patient out of the medication? No  Has the patient been seen for an appointment in the last year OR does the patient have an upcoming appointment? Yes  Can we respond through MyChart? Yes  Agent: Please be advised that Rx refills may take up to 3 business days. We ask that you follow-up with your pharmacy.

## 2024-09-24 MED ORDER — ALBUTEROL SULFATE HFA 108 (90 BASE) MCG/ACT IN AERS: 2.0000 | INHALATION_SPRAY | RESPIRATORY_TRACT | 1 refills | Status: AC | PRN

## 2024-09-25 DIAGNOSIS — I10 Essential (primary) hypertension: Secondary | ICD-10-CM | POA: Insufficient documentation

## 2024-09-25 DIAGNOSIS — R0781 Pleurodynia: Secondary | ICD-10-CM | POA: Insufficient documentation

## 2024-09-25 DIAGNOSIS — R739 Hyperglycemia, unspecified: Secondary | ICD-10-CM | POA: Insufficient documentation

## 2024-09-25 NOTE — Assessment & Plan Note (Signed)
 Recent increase.. eval with labs.

## 2024-09-25 NOTE — Assessment & Plan Note (Addendum)
 Acute, unclear cause. Eval with CXR. Former smoker.. history of COPD.

## 2024-09-25 NOTE — Assessment & Plan Note (Signed)
 Chronic S/P multiple  surgery x 3  Dr. Lucilla. 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 most recent 2023  gabapentin  to  300 mg in AM and 2 at night has helped

## 2024-09-25 NOTE — Assessment & Plan Note (Signed)
 Chronic hypertension with spikes causing neurologic changes. Treated with additional amlodipine  5 mg daily.  Eval with labs for causes on recent BP increases. Possibly related to pain and stress... poor control.

## 2024-09-25 NOTE — Assessment & Plan Note (Signed)
 Referred to counseling at last OV .  Improved come with increase to  cymbalta  60 mg daily at night.    09/14/2024    2:55 PM 05/05/2024    4:15 PM 04/08/2024    3:19 PM  PHQ9 SCORE ONLY  PHQ-9 Total Score 11 12  12       Data saved with a previous flowsheet row definition    Inadequate control. Consider possible increase in medication if not continuing to improve.

## 2024-09-28 ENCOUNTER — Other Ambulatory Visit: Payer: Self-pay | Admitting: Family Medicine

## 2024-09-28 MED ORDER — ROSUVASTATIN CALCIUM 10 MG PO TABS: 10.0000 mg | ORAL_TABLET | Freq: Every day | ORAL | 3 refills | Status: AC

## 2024-09-29 NOTE — Telephone Encounter (Signed)
 Last office visit 09/14/2024 for Medical Management of Chronic Issues.  Last refilled 05/21/24 for 30 g with no refills.  Next appt: No future appointments with PCP.

## 2024-10-05 NOTE — Progress Notes (Signed)
 " [facility] - Clinic Note  10/19/2024   CHIEF COMPLAINT Patient presents for Retina Follow Up  HISTORY OF PRESENT ILLNESS: [name] is a 68 y.o. female who presents to the clinic today for:  HPI     Retina Follow Up   Patient presents with  Wet AMD.  In both eyes.  This started 8 weeks ago.  Duration of 8 weeks.  Since onset it is gradually worsening.  I, the attending physician,  performed the HPI with the patient and updated documentation appropriately.        Comments   8 week retina follow up AMD OU and I'VE OS pt is reporting vision maybe little worse she was wonder if cataract surgery would her in her right eye pt had cataract surgery in November on OS she is using latanoprost  at bedtime OS       Last edited by Valdemar Rogue, MD on 10/19/2024 12:27 PM.    Patient states she's not doing well, swelling in her legs. Just began lasix  yesterday. Pt had ECG. Considering cataract sx OD.   Referring physician: Avelina Greig BRAVO, MD [address] [place],  [place] [address]  HISTORICAL INFORMATION:  Selected notes from the MEDICAL RECORD NUMBER Referred by Dr. Vivian for concern of ARMD  LEE:  Ocular Hx- PMH-   CURRENT MEDICATIONS: Current Outpatient Medications (Ophthalmic Drugs)  Medication Sig   latanoprost  (XALATAN ) 0.005 % ophthalmic solution Place 1 drop into both eyes at bedtime.   No current facility-administered medications for this visit. (Ophthalmic Drugs)   Current Outpatient Medications (Other)  Medication Sig   acetaminophen  (TYLENOL ) 500 MG tablet Take 500 mg by mouth every 6 (six) hours as needed for mild pain (pain score 1-3).   albuterol  (VENTOLIN  HFA) 108 (90 Base) MCG/ACT inhaler Inhale 2 puffs into the lungs every 4 (four) hours as needed for wheezing or shortness of breath. SMARTSIG:2 Puff(s) By Mouth Every 4 Hours   amLODipine  (NORVASC ) 5 MG tablet Take 5 mg by mouth daily.   DULoxetine  (CYMBALTA ) 60 MG capsule Take 1  capsule (60 mg total) by mouth daily.   furosemide  (LASIX ) 20 MG tablet Take 1 tablet (20 mg total) by mouth daily as needed for edema or fluid.   gabapentin  (NEURONTIN ) 300 MG capsule TAKE 1 CAPSULE BY MOUTH TWICE A DAY   HYDROcodone -acetaminophen  (NORCO/VICODIN) 5-325 MG tablet Take 0.5-1 tablets by mouth every 12 (twelve) hours as needed for severe pain (pain score 7-10).   methadone  (DOLOPHINE ) 10 MG/ML solution Take 20 mLs (200 mg total) by mouth in the morning. Home med.   potassium chloride  SA (KLOR-CON  M) 20 MEQ tablet Take 1 tablet (20 mEq total) by mouth daily. Take each time furosemide  taken.   rosuvastatin  (CRESTOR ) 10 MG tablet Take 1 tablet (10 mg total) by mouth daily.   SYMBICORT 160-4.5 MCG/ACT inhaler 1 puff 2 (two) times daily.   tiZANidine (ZANAFLEX) 2 MG tablet Take 2 mg by mouth at bedtime.   triamcinolone  cream (KENALOG ) 0.5 % APPLY TO AFFECTED AREA TWICE A DAY   No current facility-administered medications for this visit. (Other)   REVIEW OF SYSTEMS: ROS   Positive for: Cardiovascular, Eyes Negative for: Constitutional, Gastrointestinal, Neurological, Skin, Genitourinary, Musculoskeletal, HENT, Endocrine, Respiratory, Psychiatric, Allergic/Imm, Heme/Lymph Last edited by Resa Delon ORN, COT on 10/19/2024  8:02 AM.       ALLERGIES Allergies  Allergen Reactions   Acetaminophen -Codeine Other (See Comments)    Hand  get red, feels like it's on fire   Morphine  And Codeine     makes me feel like i'm on fire   Nitroglycerin      Causes a headache   Tramadol  Hcl     Gives her the shakes   Azithromycin Itching and Rash   PAST MEDICAL HISTORY Past Medical History:  Diagnosis Date   Acute hepatitis C without mention of hepatic coma(070.51)    Anemia    Arthritis    back   Carpal tunnel syndrome    Chest pain, unspecified    Chronic airway obstruction, not elsewhere classified    Chronic hepatitis C without hepatic coma (HCC) 02/09/2018   Colitis     Coronary atherosclerosis of unspecified type of vessel, native or graft    Disorders of bursae and tendons in shoulder region, unspecified    Drug-induced skin rash 04/07/2018   GERD (gastroesophageal reflux disease)    Hardware complicating wound infection 02/09/2018   Headache    migraines   Hepatitis    hx of hepatitis C   Infective otitis externa, unspecified    Mixed hyperlipidemia    MRSA infection 02/09/2018   Nonspecific abnormal results of thyroid function study    Other bursitis disorders    Other symptoms involving nervous and musculoskeletal systems(781.99)    Pneumonia    Seizures (HCC)    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   Thrush 04/07/2018   Past Surgical History:  Procedure Laterality Date   ABDOMINAL HYSTERECTOMY  1999   CARDIAC CATHETERIZATION     CARDIAC CATHETERIZATION N/A 06/25/2016   Procedure: Left Heart Cath and Coronary Angiography;  Surgeon: Salena Negri, MD;  Location: [facility];  Service: Cardiovascular;  Laterality: N/A;   CESAREAN SECTION     CHOLECYSTECTOMY  2000   COLONOSCOPY     IR FLUORO GUIDED NEEDLE PLC ASPIRATION/INJECTION LOC  12/27/2017   LEFT HEART CATH AND CORONARY ANGIOGRAPHY N/A 06/06/2022   Procedure: LEFT HEART CATH AND CORONARY ANGIOGRAPHY;  Surgeon: Negri Salena, MD;  Location: [facility];  Service: Cardiovascular;  Laterality: N/A;   LUMBAR FUSION  01/13/2018   Transforaminal lumbar interbody fusion L4-5 with screws, cages and rods, local bone graft, Allograft, Vivigen, Decompression L4-5 and L5-S1   TONSILLECTOMY  1968   FAMILY HISTORY Family History  Problem Relation Age of Onset   Colonic polyp Mother    Thyroid disease Mother    COPD Mother    Macular degeneration Mother    Uterine cancer Maternal Grandmother    Crohn's disease Daughter 54       colon resection   Irritable bowel syndrome Daughter    Heart murmur Daughter        SVT   Colon cancer Neg Hx    SOCIAL HISTORY Social  History   Tobacco Use   Smoking status: Former    Current packs/day: 0.00    Types: Cigarettes    Start date: 06/16/1969    Quit date: 06/16/2022    Years since quitting: 2.3   Smokeless tobacco: Never   Tobacco comments:    2 cigarettes a day  Vaping Use   Vaping status: Former   Quit date: 02/26/2022  Substance Use Topics   Alcohol use: No   Drug use: No       OPHTHALMIC EXAM:  Base Eye Exam     Visual Acuity (Snellen - Linear)       Right Left  Dist [place] CF at 3' 20/70 -3   Dist ph  20/200 -2 20/50 -3         Tonometry (Tonopen, 8:10 AM)       Right Left   Pressure 14 12         Pupils       Pupils Dark Light Shape React APD   Right PERRL 3 2 Round Brisk None   Left PERRL 3 2 Round Brisk None         Visual Fields       Left Right    Full Full         Extraocular Movement       Right Left    Full, Ortho Full, Ortho         Neuro/Psych     Oriented x3: Yes   Mood/Affect: Normal         Dilation     Both eyes: 2.5% Phenylephrine  @ 8:10 AM           Slit Lamp and Fundus Exam     Slit Lamp Exam       Right Left   Lids/Lashes Dermatochalasis - upper lid Dermatochalasis - upper lid   Conjunctiva/Sclera nasal and temporal pinguecula nasal and temporal pinguecula   Cornea 2+ Punctate epithelial erosions, tear film debris 2+ Punctate epithelial erosions, tear film debris, Well healed temporal cataract wound   Anterior Chamber deep and clear deep and clear   Iris Round and dilated Round and dilated   Lens 2-3+ Nuclear sclerosis, 2-3+ Cortical cataract PC IOL in good position   Anterior Vitreous mild syneresis mild syneresis, Posterior vitreous detachment         Fundus Exam       Right Left   Disc Pink and Sharp, +cupping Pink and Sharp, PPA   C/D Ratio 0.7 0.3   Macula Blunted foveal reflex, central subretinal fibrosis / scar with surrounding atrophy, +pigment clumping, no heme or edema Blunted foveal reflex, central CNV  with pigment ring and edema, shallow SRF -- stably improved, no frank heme, RPE mottling, clumping and atrophy   Vessels attenuated, Tortuous attenuated, mild tortuosity   Periphery Attached, No heme Attached, No heme           Refraction     Wearing Rx       Sphere Cylinder Axis   Right -2.75 +1.50 121   Left -2.75 +1.50 120    Type: SVL           IMAGING AND PROCEDURES  Imaging and Procedures for 10/19/2024  OCT, Retina - OU - Both Eyes       Right Eye Quality was good. Central Foveal Thickness: 291. Progression has been stable. Findings include no IRF, no SRF, abnormal foveal contour, retinal drusen , outer retinal atrophy [facility] -- large central disciform scar; no fluid).   Left Eye Quality was good. Central Foveal Thickness: 315. Progression has been stable. Findings include no SRF, abnormal foveal contour, retinal drusen , subretinal hyper-reflective material, choroidal neovascular membrane, intraretinal fluid (Central CNV with stable improvement in SRF, persistent cystic changes IN fovea, persistent SRHM).   Notes *Images captured and stored on drive  Diagnosis / Impression:  Exudative ARMD OU OD: Central SRHM -- large central disciform scar; no fluid OS: Central CNV with stable improvement in SRF, persistent cystic changes IN fovea, persistent SRHM  Clinical management:  See below  Abbreviations: NFP - Normal foveal profile. CME - cystoid  macular edema. PED - pigment epithelial detachment. IRF - intraretinal fluid. SRF - subretinal fluid. EZ - ellipsoid zone. ERM - epiretinal membrane. ORA - outer retinal atrophy. ORT - outer retinal tubulation. SRHM - subretinal hyper-reflective material. IRHM - intraretinal hyper-reflective material      Intravitreal Injection, Pharmacologic Agent - OS - Left Eye       Time Out 10/19/2024. 9:01 AM. Confirmed correct patient, procedure, site, and patient consented.   Anesthesia Topical anesthesia was used.  Anesthetic medications included Lidocaine  2%, Proparacaine 0.5%.   Procedure Preparation included 5% betadine to ocular surface, eyelid speculum. A (32g) needle was used.   Injection: 2 mg aflibercept  2 MG/0.05ML   Route: Intravitreal, Site: Left Eye   NDC: [id number], Lot: [id number], Expiration date: 11/13/2025, Waste: 0 mL   Post-op Post injection exam found visual acuity of at least counting fingers. The patient tolerated the procedure well. There were no complications. The patient received written and verbal post procedure care education.            ASSESSMENT/PLAN:   ICD-10-CM   1. Exudative age-related macular degeneration of both eyes with active choroidal neovascularization (HCC)  H35.3231 OCT, Retina - OU - Both Eyes    Intravitreal Injection, Pharmacologic Agent - OS - Left Eye    aflibercept  (EYLEA ) SOLN 2 mg    2. Essential hypertension  I10     3. Hypertensive retinopathy of both eyes  H35.033     4. Combined forms of age-related cataract of right eye  H25.811     5. Pseudophakia of left eye  Z96.1       Exudative age related macular degeneration, both eyes - OD -- inactive with disciform scar - OS w/ central edema and shallow SRF - s/p IVA OS #1 (09.03.24), #2 (10.01.24), #3 (10.29.24) -- IVA resistance =================== - s/p IVE OS #1 (12.02.24), #2 (12.30.24), #3 (02.03.25), #4 (03.17.25), #5 (05.05.25), #6 (06.16.25), #7 (07.28.25), #8 (09.19.25), #9 (11.11.25) - BCVA OD 20/200-stable; OS stable at 20/60 - OCT shows OD: Central SRHM -- large central disciform scar; no fluid; OS: Central CNV with stable improvement in SRF, persistent cystic changes IN fovea, persistent SRHM at 8 wks  - recommend IVE OS #10  today, 01.06.26 w/ f/u in 8 wks  - pt wishes to proceed with injection  - RBA of procedure discussed, questions answered - IVE informed consent obtained and signed, 12.02.24 (OS) - see procedure note - f/u in 8 wks, DFE, OCT, possible  injxn   2,3. Hypertensive retinopathy OU - discussed importance of tight BP control - monitor   4. Mixed Cataract OD - The symptoms of cataract, surgical options, and treatments and risks were discussed with patient. - discussed diagnosis and progression - clear from a retina standpoint to proceed with cataract surgery when pt and surgeon are ready  - Has appt at end of the month w/ Dr. Fleeta   5. Pseudophakia OS  - s/p CE/IOL 11.18.25 w/ Dr. Fleeta  - IOL in good position, doing well  - monitor     Ophthalmic Meds Ordered this visit:  Meds ordered this encounter  Medications   aflibercept  (EYLEA ) SOLN 2 mg     Return in about 8 weeks (around 12/14/2024) for exu ARMD OU, DFE, OCT, poss IVE OS.  There are no Patient Instructions on file for this visit.  Explained the diagnoses, plan, and follow up with the patient and they expressed understanding.  Patient expressed understanding of the importance  of proper follow up care.   This document serves as a record of services personally performed by Redell JUDITHANN Hans, MD, PhD. It was created on their behalf by Wanda GEANNIE Keens, COT an ophthalmic technician. The creation of this record is the provider's dictation and/or activities during the visit.    Electronically signed by:  Wanda GEANNIE Keens, COT  10/21/2024 6:30 PM  This document serves as a record of services personally performed by Redell JUDITHANN Hans, MD, PhD. It was created on their behalf by Almetta Pesa, an ophthalmic technician. The creation of this record is the provider's dictation and/or activities during the visit.    Electronically signed by: Almetta Pesa, OA, 10/21/2024  6:30 PM  Redell JUDITHANN Hans, M.D., Ph.D. Diseases & Surgery of the Retina and Vitreous [facility]  I have reviewed the above documentation for accuracy and completeness, and I agree with the above. Redell JUDITHANN Hans, M.D., Ph.D. 10/21/2024 6:33 PM   Abbreviations: M myopia  (nearsighted); A astigmatism; H hyperopia (farsighted); P presbyopia; Mrx spectacle prescription;  CTL contact lenses; OD right eye; OS left eye; OU both eyes  XT exotropia; ET esotropia; PEK punctate epithelial keratitis; PEE punctate epithelial erosions; DES dry eye syndrome; MGD meibomian gland dysfunction; ATs artificial tears; PFAT's preservative free artificial tears; NSC nuclear sclerotic cataract; PSC posterior subcapsular cataract; ERM epi-retinal membrane; PVD posterior vitreous detachment; RD retinal detachment; DM diabetes mellitus; DR diabetic retinopathy; NPDR non-proliferative diabetic retinopathy; PDR proliferative diabetic retinopathy; CSME clinically significant macular edema; DME diabetic macular edema; dbh dot blot hemorrhages; CWS cotton wool spot; POAG primary open angle glaucoma; C/D cup-to-disc ratio; HVF humphrey visual field; GVF goldmann visual field; OCT optical coherence tomography; IOP intraocular pressure; BRVO Branch retinal vein occlusion; CRVO central retinal vein occlusion; CRAO central retinal artery occlusion; BRAO branch retinal artery occlusion; RT retinal tear; SB scleral buckle; PPV pars plana vitrectomy; VH Vitreous hemorrhage; PRP panretinal laser photocoagulation; IVK intravitreal kenalog ; VMT vitreomacular traction; MH Macular hole;  NVD neovascularization of the disc; NVE neovascularization elsewhere; AREDS age related eye disease study; ARMD age related macular degeneration; POAG primary open angle glaucoma; EBMD epithelial/anterior basement membrane dystrophy; ACIOL anterior chamber intraocular lens; IOL intraocular lens; PCIOL posterior chamber intraocular lens; Phaco/IOL phacoemulsification with intraocular lens placement; PRK photorefractive keratectomy; LASIK laser assisted in situ keratomileusis; HTN hypertension; DM diabetes mellitus; COPD chronic obstructive pulmonary disease  "

## 2024-10-08 ENCOUNTER — Encounter: Payer: Self-pay | Admitting: Family Medicine

## 2024-10-08 ENCOUNTER — Encounter: Payer: Self-pay | Admitting: Emergency Medicine

## 2024-10-08 ENCOUNTER — Emergency Department

## 2024-10-08 ENCOUNTER — Emergency Department
Admission: EM | Admit: 2024-10-08 | Discharge: 2024-10-08 | Disposition: A | Discharge status: Home / Self Care | Attending: Emergency Medicine | Admitting: Emergency Medicine

## 2024-10-08 ENCOUNTER — Other Ambulatory Visit: Payer: Self-pay

## 2024-10-08 DIAGNOSIS — I2510 Atherosclerotic heart disease of native coronary artery without angina pectoris: Secondary | ICD-10-CM | POA: Insufficient documentation

## 2024-10-08 DIAGNOSIS — M7989 Other specified soft tissue disorders: Secondary | ICD-10-CM | POA: Diagnosis present

## 2024-10-08 DIAGNOSIS — J449 Chronic obstructive pulmonary disease, unspecified: Secondary | ICD-10-CM | POA: Diagnosis not present

## 2024-10-08 DIAGNOSIS — R600 Localized edema: Secondary | ICD-10-CM | POA: Diagnosis not present

## 2024-10-08 LAB — CBC WITH DIFFERENTIAL/PLATELET
Abs Immature Granulocytes: 0.09 K/uL — ABNORMAL HIGH (ref 0.00–0.07)
Basophils Absolute: 0.1 K/uL (ref 0.0–0.1)
Basophils Relative: 1 %
Eosinophils Absolute: 0.4 K/uL (ref 0.0–0.5)
Eosinophils Relative: 4 %
HCT: 39.5 % (ref 36.0–46.0)
Hemoglobin: 11.9 g/dL — ABNORMAL LOW (ref 12.0–15.0)
Immature Granulocytes: 1 %
Lymphocytes Relative: 27 %
Lymphs Abs: 3.4 K/uL (ref 0.7–4.0)
MCH: 26.2 pg (ref 26.0–34.0)
MCHC: 30.1 g/dL (ref 30.0–36.0)
MCV: 86.8 fL (ref 80.0–100.0)
Monocytes Absolute: 1.3 K/uL — ABNORMAL HIGH (ref 0.1–1.0)
Monocytes Relative: 10 %
Neutro Abs: 7.2 K/uL (ref 1.7–7.7)
Neutrophils Relative %: 57 %
Platelets: 378 K/uL (ref 150–400)
RBC: 4.55 MIL/uL (ref 3.87–5.11)
RDW: 17.4 % — ABNORMAL HIGH (ref 11.5–15.5)
WBC: 12.4 K/uL — ABNORMAL HIGH (ref 4.0–10.5)
nRBC: 0 % (ref 0.0–0.2)

## 2024-10-08 LAB — BASIC METABOLIC PANEL WITH GFR
Anion gap: 12 (ref 5–15)
BUN: 17 mg/dL (ref 8–23)
CO2: 27 mmol/L (ref 22–32)
Calcium: 9.3 mg/dL (ref 8.9–10.3)
Chloride: 100 mmol/L (ref 98–111)
Creatinine, Ser: 0.78 mg/dL (ref 0.44–1.00)
GFR, Estimated: >60 mL/min
Glucose, Bld: 80 mg/dL (ref 70–99)
Potassium: 4.8 mmol/L (ref 3.5–5.1)
Sodium: 139 mmol/L (ref 135–145)

## 2024-10-08 LAB — TROPONIN T, HIGH SENSITIVITY: Troponin T High Sensitivity: <15 ng/L (ref 0–19)

## 2024-10-08 MED ORDER — HYDROCODONE-ACETAMINOPHEN 5-325 MG PO TABS: 1.0000 | ORAL_TABLET | ORAL | 0 refills | Status: DC | PRN

## 2024-10-08 MED ORDER — FUROSEMIDE 20 MG PO TABS: 20.0000 mg | ORAL_TABLET | Freq: Every day | ORAL | 0 refills | Status: DC

## 2024-10-08 MED ORDER — HYDROCODONE-ACETAMINOPHEN 5-325 MG PO TABS
1.0000 | ORAL_TABLET | Freq: Once | ORAL | Status: AC
  Administered 2024-10-08: 1 via ORAL
  Filled 2024-10-08: qty 1

## 2024-10-08 NOTE — ED Provider Notes (Signed)
 "  [facility] Provider Note    Event Date/Time   First MD Initiated Contact with Patient 10/08/24 1351     (approximate)  History   Chief Complaint: Leg Swelling  HPI  [name] is a 68 y.o. female with a past medical history of COPD, CAD, gastric reflux, presents to the emergency department for leg swelling and discomfort.  According to the patient for the last for 5 days she has noted some swelling in both of her legs with some pain in her right leg at times.  Patient denies any swelling prior to this.  Denies any chest pain or shortness of breath besides her chronic COPD.  Physical Exam   Triage Vital Signs: ED Triage Vitals [10/08/24 1221]  Encounter Vitals Group     BP (!) 151/63     Girls Systolic BP Percentile      Girls Diastolic BP Percentile      Boys Systolic BP Percentile      Boys Diastolic BP Percentile      Pulse Rate 76     Resp 18     Temp 98 F (36.7 C)     Temp Source Oral     SpO2 97 %     Weight 191 lb 12.8 oz (87 kg)     Height 5' (1.524 m)     Head Circumference      Peak Flow      Pain Score 8     Pain Loc      Pain Education      Exclude from Growth Chart     Most recent vital signs: Vitals:   10/08/24 1221  BP: (!) 151/63  Pulse: 76  Resp: 18  Temp: 98 F (36.7 C)  SpO2: 97%    General: Awake, no distress.  CV:  Good peripheral perfusion.  Regular rate and rhythm  Resp:  Normal effort.  Equal breath sounds bilaterally.  Abd:  No distention.   Other:  Patient has 1+ lower extremity edema bilaterally some tenderness to palpation mostly in the right leg.   ED Results / Procedures / Treatments   RADIOLOGY  Ultrasound negative for DVT   MEDICATIONS ORDERED IN ED: Medications  HYDROcodone -acetaminophen  (NORCO/VICODIN) 5-325 MG per tablet 1 tablet (1 tablet Oral Given 10/08/24 1422)     IMPRESSION / MDM / ASSESSMENT AND PLAN / ED COURSE  I reviewed the triage vital signs and the nursing  notes.  Patient's presentation is most consistent with acute presentation with potential threat to life or bodily function.  Patient presents to the emergency department for swelling in both of her legs her last 4 days with some pain especially in the right leg.  Exam is overall reassuring.  Patient denies any shortness of breath or chest pain.  However given new onset lower extremity edema we will check labs including cardiac enzyme.  Patient's lab work shows reassuring CBC, reassuring chemistry.  Troponin pending.  Ultrasound is negative for DVT.  Patient has a cardiologist with whom she already follows.  Discussed with patient if the troponin is negative we will plan to discharge home and have the patient follow-up with her cardiologist for repeat echocardiogram which the patient has had previously per patient.  We will also place the patient on 5 days of Lasix  to help with the swelling until she can see her cardiologist.  Patient's troponin is negative.  We will place the patient on Lasix  20 mg for 5  days have the patient follow-up with her cardiologist.  Patient agreeable to plan.  FINAL CLINICAL IMPRESSION(S) / ED DIAGNOSES   Lower extremity edema    Note:  This document was prepared using Dragon voice recognition software and may include unintentional dictation errors.   Dorothyann Drivers, MD 10/08/24 1530  "

## 2024-10-08 NOTE — ED Notes (Signed)
 Patient transported to US  then to room 48

## 2024-10-08 NOTE — ED Triage Notes (Signed)
 Pt reports right lower leg swelling and redness for 3 days. PCP sent for DVT work up. Denies open wound.

## 2024-10-08 NOTE — Telephone Encounter (Signed)
 Called Daughter on [place]. States that symptoms stared on 12/24 she said she had some soreness in foot when walking. She went to see her yesterday states that her right foot and leg up to knee is tight red and swelling. Warm to the touch. They didn't see any sore or cut in skin in leg foot or toes. Family thinks it may be DVT. I have advised that they should go to ED for evaluation. They have agreed and will call if any further questions.

## 2024-10-10 NOTE — Telephone Encounter (Signed)
 Noted and agree with recommendations.

## 2024-10-12 ENCOUNTER — Ambulatory Visit: Payer: Self-pay

## 2024-10-12 NOTE — Telephone Encounter (Signed)
 FYI Only or Action Required?: FYI only for provider: appointment scheduled on 10/15/24 and Call patient if further recommendations until appt.  Patient was last seen in primary care on 09/14/2024 by Avelina Greig BRAVO, MD.  Called Nurse Triage reporting Leg Swelling.  Symptoms began a week ago.  Interventions attempted: OTC medications: Tylenol  .  Symptoms are: unchanged.  Triage Disposition: See PCP When Office is Open (Within 3 Days)  Patient/caregiver understands and will follow disposition?: Yes  Reason for Disposition  [1] MILD swelling of both ankles (i.e., pedal edema) AND [2] new-onset or getting worse  Answer Assessment - Initial Assessment Questions Pt calling to report leg swelling in both of the legs - below the knee to the toes, warm to the touch, red. Pt was in the ED 10/08/24 - prescribed 5 lasix  pills, and 5 norco. Pt was instructed to schedule a follow-up with her PCP. Patient states the swelling is about the same as before the ED. Patient finished lasix  and norco - now taking Tylenol .   Pt scheduled for an appt with PCP 10/15/24. Pt's cell phone [phone] if PCP has further recommendations until then.   1. ONSET: When did the swelling start? (e.g., minutes, hours, days)     Over a week ago 2. LOCATION: What part of the leg is swollen?  Are both legs swollen or just one leg?     Both legs - from the knee to the toes 3. SEVERITY: How bad is the swelling? (e.g., localized; mild, moderate, severe)     Pt unable to assess 4. REDNESS: Is there redness or signs of infection?     Redness  5. PAIN: Is the swelling painful to touch? If Yes, ask: How painful is it?   (Scale 1-10; mild, moderate or severe)     4/10 6. FEVER: Do you have a fever? If Yes, ask: What is it, how was it measured, and when did it start?      Denies  7. CAUSE: What do you think is causing the leg swelling?     Pt unsure 8. MEDICAL HISTORY: Do you have a history of blood clots (e.g.,  DVT), cancer, heart failure, kidney disease, or liver failure?     N/a  9. RECURRENT SYMPTOM: Have you had leg swelling before? If Yes, ask: When was the last time? What happened that time?     Denies  10. OTHER SYMPTOMS: Do you have any other symptoms? (e.g., chest pain, difficulty breathing)       Denies  Protocols used: Leg Swelling and Edema-A-AH  Copied from CRM #[id number]. Topic: Clinical - Red Word Triage >> Oct 12, 2024  4:13 PM China J wrote: [facility] that prompted transfer to Nurse Triage: Swollen legs and ankles, pateint can hardley walk.

## 2024-10-12 NOTE — Telephone Encounter (Signed)
 Noted

## 2024-10-13 NOTE — Telephone Encounter (Signed)
 Next Appt With Family Medicine Darra Ring, MD) 10/15/2024 at 12:00 PM

## 2024-10-15 ENCOUNTER — Ambulatory Visit: Payer: Self-pay | Admitting: Family Medicine

## 2024-10-15 ENCOUNTER — Ambulatory Visit: Admitting: Family Medicine

## 2024-10-15 ENCOUNTER — Encounter: Payer: Self-pay | Admitting: Family Medicine

## 2024-10-15 VITALS — BP 128/78 | HR 68 | Temp 97.1°F | Ht 60.0 in | Wt 202.0 lb

## 2024-10-15 DIAGNOSIS — R0602 Shortness of breath: Secondary | ICD-10-CM | POA: Diagnosis not present

## 2024-10-15 DIAGNOSIS — R600 Localized edema: Secondary | ICD-10-CM

## 2024-10-15 LAB — BASIC METABOLIC PANEL WITH GFR
BUN: 19 mg/dL (ref 6–23)
CO2: 32 meq/L (ref 19–32)
Calcium: 9.3 mg/dL (ref 8.4–10.5)
Chloride: 98 meq/L (ref 96–112)
Creatinine, Ser: 0.79 mg/dL (ref 0.40–1.20)
GFR: 76.62 mL/min
Glucose, Bld: 79 mg/dL (ref 70–99)
Potassium: 4.9 meq/L (ref 3.5–5.1)
Sodium: 141 meq/L (ref 135–145)

## 2024-10-15 LAB — BRAIN NATRIURETIC PEPTIDE: Pro B Natriuretic peptide (BNP): 100 pg/mL (ref 1.0–100.0)

## 2024-10-15 NOTE — Progress Notes (Signed)
 "   Patient ID: [name], female    DOB: 1956/01/20, 69 y.o.   MRN: [id number]  This visit was conducted in person.  BP 128/78   Pulse 68   Temp (!) 97.1 F (36.2 C) (Temporal)   Ht 5' (1.524 m)   Wt 202 lb (91.6 kg)   SpO2 97%   BMI 39.45 kg/m    CC:  Chief Complaint  Patient presents with   Hospitalization Follow-up    Patient is also requesting refill on the Norco   Ear Pain    Left    Subjective:   HPI: [name] is a 69 y.o. female  with history of COPD, CAD and HTN presenting on 10/15/2024 for Hospitalization Follow-up (Patient is also requesting refill on the Norco) and Ear Pain (Left)   ED visit for leg swelling and discomfort.  Unremarkable BMET, cbc, troponin. ( Nml CMET, TSH 12/2)  US  negative for DVT.  Given lasix  20 mg daily pr x 5 days.. did not seem to help with swelling.  Given hydrocodone  for pain and she wants a refill of this.  Worse shortness of breath in last 2 days.   No change in diet.. no increase in salt.   Elevating feet above heart at night.  Trouble using compression hose.  BP Readings from Last 3 Encounters:  10/15/24 128/78  10/08/24 (!) 151/63  09/14/24 130/72   Wt Readings from Last 3 Encounters:  10/15/24 202 lb (91.6 kg)  10/08/24 191 lb 12.8 oz (87 kg)  09/14/24 192 lb 6 oz (87.3 kg)    Chronic pain syndrome: S/P multiple  surgery x 3  Dr. Lucilla. 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 most recent 2023  gabapentin  to  300 mg in AM and 2 at night has helped     Relevant past medical, surgical, family and social history reviewed and updated as indicated. Interim medical history since our last visit reviewed. Allergies and medications reviewed and updated. Outpatient Medications Prior to Visit  Medication Sig Dispense Refill   acetaminophen  (TYLENOL ) 500 MG tablet Take 500 mg by mouth every 6 (six) hours as needed for mild pain (pain score 1-3).     albuterol  (VENTOLIN  HFA) 108 (90 Base)  MCG/ACT inhaler Inhale 2 puffs into the lungs every 4 (four) hours as needed for wheezing or shortness of breath. SMARTSIG:2 Puff(s) By Mouth Every 4 Hours 6.7 g 1   amLODipine  (NORVASC ) 5 MG tablet Take 5 mg by mouth daily.     DULoxetine  (CYMBALTA ) 60 MG capsule Take 1 capsule (60 mg total) by mouth daily. 90 capsule 1   furosemide  (LASIX ) 20 MG tablet Take 1 tablet (20 mg total) by mouth daily. (Patient not taking: Reported on 10/15/2024) 5 tablet 0   gabapentin  (NEURONTIN ) 300 MG capsule TAKE 1 CAPSULE BY MOUTH TWICE A DAY 60 capsule 3   HYDROcodone -acetaminophen  (NORCO/VICODIN) 5-325 MG tablet Take 1 tablet by mouth every 4 (four) hours as needed. 12 tablet 0   latanoprost  (XALATAN ) 0.005 % ophthalmic solution Place 1 drop into both eyes at bedtime.     methadone  (DOLOPHINE ) 10 MG/ML solution Take 20 mLs (200 mg total) by mouth in the morning. Home med.     rosuvastatin  (CRESTOR ) 10 MG tablet Take 1 tablet (10 mg total) by mouth daily. 90 tablet 3   SYMBICORT 160-4.5 MCG/ACT inhaler 1 puff 2 (two) times daily.     tiZANidine (ZANAFLEX) 2 MG  tablet Take 2 mg by mouth at bedtime.     triamcinolone  cream (KENALOG ) 0.5 % APPLY TO AFFECTED AREA TWICE A DAY 30 g 0   No facility-administered medications prior to visit.     Per HPI unless specifically indicated in ROS section below Review of Systems Objective:  BP 128/78   Pulse 68   Temp (!) 97.1 F (36.2 C) (Temporal)   Ht 5' (1.524 m)   Wt 202 lb (91.6 kg)   SpO2 97%   BMI 39.45 kg/m   Wt Readings from Last 3 Encounters:  10/15/24 202 lb (91.6 kg)  10/08/24 191 lb 12.8 oz (87 kg)  09/14/24 192 lb 6 oz (87.3 kg)      Physical Exam    Results for orders placed or performed during the hospital encounter of 10/08/24  CBC with Differential   Collection Time: 10/08/24 12:22 PM  Result Value Ref Range   WBC 12.4 (H) 4.0 - 10.5 K/uL   RBC 4.55 3.87 - 5.11 MIL/uL   Hemoglobin 11.9 (L) 12.0 - 15.0 g/dL   HCT 60.4 63.9 - 53.9  %   MCV 86.8 80.0 - 100.0 fL   MCH 26.2 26.0 - 34.0 pg   MCHC 30.1 30.0 - 36.0 g/dL   RDW 82.5 (H) 88.4 - 84.4 %   Platelets 378 150 - 400 K/uL   nRBC 0.0 0.0 - 0.2 %   Neutrophils Relative % 57 %   Neutro Abs 7.2 1.7 - 7.7 K/uL   Lymphocytes Relative 27 %   Lymphs Abs 3.4 0.7 - 4.0 K/uL   Monocytes Relative 10 %   Monocytes Absolute 1.3 (H) 0.1 - 1.0 K/uL   Eosinophils Relative 4 %   Eosinophils Absolute 0.4 0.0 - 0.5 K/uL   Basophils Relative 1 %   Basophils Absolute 0.1 0.0 - 0.1 K/uL   Immature Granulocytes 1 %   Abs Immature Granulocytes 0.09 (H) 0.00 - 0.07 K/uL  Basic metabolic panel   Collection Time: 10/08/24 12:22 PM  Result Value Ref Range   Sodium 139 135 - 145 mmol/L   Potassium 4.8 3.5 - 5.1 mmol/L   Chloride 100 98 - 111 mmol/L   CO2 27 22 - 32 mmol/L   Glucose, Bld 80 70 - 99 mg/dL   BUN 17 8 - 23 mg/dL   Creatinine, Ser 9.21 0.44 - 1.00 mg/dL   Calcium  9.3 8.9 - 10.3 mg/dL   GFR, Estimated >39 >39 mL/min   Anion gap 12 5 - 15  Troponin T, High Sensitivity   Collection Time: 10/08/24 12:22 PM  Result Value Ref Range   Troponin T High Sensitivity <15 0 - 19 ng/L    Assessment and Plan  There are no diagnoses linked to this encounter.  No follow-ups on file.   Greig Ring, MD  "

## 2024-10-17 ENCOUNTER — Encounter: Payer: Self-pay | Admitting: Family Medicine

## 2024-10-18 ENCOUNTER — Ambulatory Visit: Payer: Self-pay

## 2024-10-18 MED ORDER — POTASSIUM CHLORIDE CRYS ER 20 MEQ PO TBCR: 20.0000 meq | EXTENDED_RELEASE_TABLET | Freq: Every day | ORAL | 0 refills | Status: DC

## 2024-10-18 MED ORDER — HYDROCODONE-ACETAMINOPHEN 5-325 MG PO TABS: 0.5000 | ORAL_TABLET | Freq: Two times a day (BID) | ORAL | 0 refills | Status: DC | PRN

## 2024-10-18 MED ORDER — FUROSEMIDE 20 MG PO TABS: 20.0000 mg | ORAL_TABLET | Freq: Every day | ORAL | 0 refills | Status: AC | PRN

## 2024-10-18 NOTE — Telephone Encounter (Signed)
 FYI Only or Action Required?: Action required by provider: Refusing ED, requesting that meds (lasix  and pain med) discussed at 1/2 appt with Dr. Avelina be sent to pharmacy as were told, also requesting doc confirm need to go to hospital before they go, needs call back. Alerted CAL to ED refusal.  Patient was last seen in primary care on 10/15/2024 by Avelina Greig BRAVO, MD.  Called Nurse Triage reporting Leg Swelling.  Symptoms began several days ago.  Interventions attempted: Prescription medications: 5 days lasix  from hospital, Rest, hydration, or home remedies, and Other: ED on 12/26, PCP appt 1/2.  Symptoms are: rapidly worsening.  Triage Disposition: Go to ED Now (Notify PCP)  Patient/caregiver understands and will follow disposition?: No, wishes to speak with PCP       Copied from CRM #[id number]. Topic: Clinical - Red Word Triage >> Oct 18, 2024  8:33 AM Rea ORN wrote: Red Word that prompted transfer to Nurse Triage: Swelling in extremities. Pt was seen on 1/2 for hospital follow up and was told at that appt that PCP would call in lasix  and a pain rx. Nothing was ever called in the swelling has moved up her body and she is in pain. Reason for Disposition  [1] Can't walk or can barely walk AND [2] new-onset  Answer Assessment - Initial Assessment Questions This RN recommended pt be examined in ED for worsening symptoms. Pt daughter refusing at this time, requesting that meds discussed at 1/2 appt with Dr. Avelina be sent to pharmacy as were told, also requesting doc confirm need to go to hospital before they go. Alerted CAL to ED refusal.    Speaking with pt daughter [name] PCP on 1/2 said that lasix  and pain med would be sent in, said didn't want to send until results back Results came back Messaging back and forth, gave pharmacy, no results Now swelling Don't know if need to just get med or get admitted to hospital Both legs, was just to her knees, now started swelling above her  knees Wrists look a little swollen Can't get up and walk hardly at all Dr. Sherrel seen all of this Did not want to put her on lasix  until bloodwork back, everything in range, was going to call in 20 mg lasix , got nothing in pharmacy All the same as appt Complaining of breathing, said probably from her laying around, listened to all of it, all sounded great Worried that if don't get something soon that fluid will come up around her heart No chest pain Pain from tightness of her skin, legs are throbbing No SOB at rest Can barely walk now When we went into office, doc examined her, looked like marshmallow with ankles and toes Progressed a little bit, up above her knees now and some in her wrists No redness, no weeping of fluid A little heat but still the same as in office, not red and hot So tight skin so stretched out Think I'm gonna wait on that route, so afraid to take her Of course if it becomes an emergency but kind of want to wait to see what the doc says When she was on that 5 days of lasix  from the hospital, was peeing a lot getting rid of the fluid Want to see what the doctor says, someone get back in touch with me If doc says need to go to hospital then will do so Been keeping up with her vitals, have pulse ox (in 90s), BP (3 am 153/68),  all been good/stable  Protocols used: Leg Swelling and Edema-A-AH

## 2024-10-18 NOTE — Telephone Encounter (Signed)
 [name] (daughter) notified as instructed by telephone.

## 2024-10-19 ENCOUNTER — Encounter (INDEPENDENT_AMBULATORY_CARE_PROVIDER_SITE_OTHER): Payer: Self-pay | Admitting: Ophthalmology

## 2024-10-19 ENCOUNTER — Ambulatory Visit (INDEPENDENT_AMBULATORY_CARE_PROVIDER_SITE_OTHER): Admitting: Ophthalmology

## 2024-10-19 DIAGNOSIS — H353231 Exudative age-related macular degeneration, bilateral, with active choroidal neovascularization: Secondary | ICD-10-CM | POA: Diagnosis not present

## 2024-10-19 DIAGNOSIS — H35033 Hypertensive retinopathy, bilateral: Secondary | ICD-10-CM | POA: Diagnosis not present

## 2024-10-19 DIAGNOSIS — H25813 Combined forms of age-related cataract, bilateral: Secondary | ICD-10-CM

## 2024-10-19 DIAGNOSIS — I10 Essential (primary) hypertension: Secondary | ICD-10-CM

## 2024-10-19 DIAGNOSIS — Z961 Presence of intraocular lens: Secondary | ICD-10-CM | POA: Diagnosis not present

## 2024-10-19 DIAGNOSIS — H25811 Combined forms of age-related cataract, right eye: Secondary | ICD-10-CM | POA: Diagnosis not present

## 2024-10-19 MED ORDER — AFLIBERCEPT 2MG/0.05ML IZ SOLN FOR KALEIDOSCOPE
2.0000 mg | INTRAVITREAL | Status: AC | PRN
  Administered 2024-10-19: 2 mg via INTRAVITREAL

## 2024-10-21 ENCOUNTER — Ambulatory Visit: Payer: Self-pay

## 2024-10-21 NOTE — Telephone Encounter (Signed)
"   Call Make sure she is elevating her feet above her heart. We can try increasing Lasix  to 2 tablets daily. She is scheduled for echocardiogram tomorrow.  If severe shortness of breath she needs to go to the emergency room. "

## 2024-10-21 NOTE — Telephone Encounter (Signed)
 Left message for [name] with information below from Dr. Avelina.

## 2024-10-21 NOTE — Telephone Encounter (Signed)
 FYI Only or Action Required?: Action required by provider: update on patient condition and requesting call back to daughter [name] [phone].  Patient was last seen in primary care on 10/15/2024 by Linda Greig BRAVO, MD.  Called Nurse Triage reporting Leg Swelling.  Symptoms began several weeks ago.  Interventions attempted: Prescription medications: lasix , Rest, hydration, or home remedies, and Other: PCP appt 1/2, ED 12/26, echo for 1/9.  Symptoms are: unchanged.  Triage Disposition: See Physician Within 24 Hours  Patient/caregiver understands and will follow disposition?: No, wishes to speak with PCP       Copied from CRM #[id number]. Topic: Clinical - Red Word Triage >> Oct 21, 2024 12:42 PM Robinson H wrote: Red Word that prompted transfer to Nurse Triage: Came in on Monday for leg swelling given Lasix  been on for 4 days and legs haven't went down, throbbing pain, pain is a 7, legs are red from ankle to knee, can hardly bend knee Reason for Disposition  Swelling of face, arm or hands  (Exception: Slight puffiness of fingers occurring during hot weather.)  Answer Assessment - Initial Assessment Questions Pt confirms she has had no new or worsening symptoms since seeing doc on Friday 1/2, pt has had no improvement on lasix . This RN recommended pt be examined today, pt refusing disposition, asking if should continue lasix  or do something different. Advised pt call back or seek immediate care if any new or worsening symptoms. Sending message to PCP office for call back to pt's daughter [name] [phone] with further recommendations.    Speaking with pt Pt confirms no changes at all to her symptoms Continued levels of SOB, pallor to feet, leg swelling/heat, numbness to toes/foot, mild swelling to arms and hands - all same as appt per pt Echo tomorrow  Protocols used: Leg Swelling and Edema-A-AH

## 2024-10-22 ENCOUNTER — Ambulatory Visit
Admission: RE | Admit: 2024-10-22 | Discharge: 2024-10-22 | Disposition: A | Source: Ambulatory Visit | Attending: Family Medicine | Admitting: Family Medicine

## 2024-10-22 DIAGNOSIS — R600 Localized edema: Secondary | ICD-10-CM | POA: Insufficient documentation

## 2024-10-22 DIAGNOSIS — I3481 Nonrheumatic mitral (valve) annulus calcification: Secondary | ICD-10-CM | POA: Diagnosis not present

## 2024-10-22 DIAGNOSIS — I517 Cardiomegaly: Secondary | ICD-10-CM | POA: Diagnosis not present

## 2024-10-22 DIAGNOSIS — R079 Chest pain, unspecified: Secondary | ICD-10-CM | POA: Diagnosis not present

## 2024-10-22 DIAGNOSIS — R0602 Shortness of breath: Secondary | ICD-10-CM | POA: Insufficient documentation

## 2024-10-22 DIAGNOSIS — E785 Hyperlipidemia, unspecified: Secondary | ICD-10-CM | POA: Diagnosis not present

## 2024-10-22 LAB — ECHOCARDIOGRAM COMPLETE
AR max vel: 1.12 cm2
AV Area VTI: 0.86 cm2
AV Area mean vel: 1.01 cm2
AV Mean grad: 4 mmHg
AV Peak grad: 6.9 mmHg
Ao pk vel: 1.31 m/s
Area-P 1/2: 3.24 cm2
S' Lateral: 2.55 cm

## 2024-10-22 NOTE — Progress Notes (Signed)
*  PRELIMINARY RESULTS* Echocardiogram 2D Echocardiogram has been performed.  [name] 10/22/2024, 9:47 AM

## 2024-10-29 ENCOUNTER — Ambulatory Visit: Payer: Self-pay | Admitting: Family Medicine

## 2024-10-29 NOTE — Telephone Encounter (Signed)
 FYI Only or Action Required?: FYI only for provider: appointment scheduled on 1/21; clinical question for provider.  Patient was last seen in primary care on 10/15/2024 by Avelina Greig BRAVO, MD.  Called Nurse Triage reporting Leg Swelling.  Symptoms began about a month ago.  Interventions attempted: Prescription medications: Lasix .  Symptoms are: gradually worsening.  Triage Disposition: See Physician Within 24 Hours  Patient/caregiver understands and will follow disposition?: Yes             Message from [facility] C sent at 10/29/2024  2:46 PM EST  Summary: Legs and feet swollen   Reason for Triage: Legs and feet still swollen. Feet feel like they are on fire and legs are hurting to touch. Patient has been lasix  but it's not working. 2/day 20 mg.      Reason for Disposition  [1] MODERATE leg swelling (e.g., swelling extends up to knees) AND [2] new-onset or getting worse  Answer Assessment - Initial Assessment Questions 1. ONSET: When did the swelling start? (e.g., minutes, hours, days)     X 1 month   2. LOCATION: What part of the leg is swollen?  Are both legs swollen or just one leg?     BIL below knee into foot   3. SEVERITY: How bad is the swelling? (e.g., localized; mild, moderate, severe)     Moderate   4. REDNESS: Is there redness or signs of infection?     Some redness noted-mild   5. PAIN: Is the swelling painful to touch? If Yes, ask: How painful is it?   (Scale 1-10; mild, moderate or severe)     Moderate   6. FEVER: Do you have a fever? If Yes, ask: What is it, how was it measured, and when did it start?      No   7. CAUSE: What do you think is causing the leg swelling?     CAD, HTN, COPD   8. MEDICAL HISTORY: Do you have a history of blood clots (e.g., DVT), cancer, heart failure, kidney disease, or liver failure?     No   9. RECURRENT SYMPTOM: Have you had leg swelling before? If Yes, ask: When was the last time? What  happened that time?     No   10. OTHER SYMPTOMS: Do you have any other symptoms? (e.g., chest pain, difficulty breathing)       No     Patient called in to triage with complaints of BIL leg, foot swelling This has been ongoing for x 1 month. The patient stated she is taking 40 mg of Lasix . She is elevating her legs for relief.   Appointment scheduled for further evaluation with the soonest available on 1/21 per Epic; Patient agrees with the plan of care, and will reach out if symptoms worsen or persist, as well as seek care in the UC/ED if symptoms worsen over the weekend. She is also requesting pain medication until she is able to be seen.  Protocols used: Leg Swelling and Edema-A-AH

## 2024-10-29 NOTE — Telephone Encounter (Signed)
 Next Appt With Family Medicine Darra Ring, MD) 11/04/2024 at 8:20 AM

## 2024-11-01 NOTE — Telephone Encounter (Signed)
 Patient is scheduled to see you on 11/03/2024.  Do you want to see her sooner than Thursday:  Patient called in to triage with complaints of BIL leg, foot swelling This has been ongoing for x 1 month. The patient stated she is taking 40 mg of Lasix . She is elevating her legs for relief.   Appointment scheduled for further evaluation with the soonest available on 1/21 per Epic; Patient agrees with the plan of care, and will reach out if symptoms worsen or persist, as well as seek care in the UC/ED if symptoms worsen over the weekend. She is also requesting pain medication until she is able to be seen.

## 2024-11-03 ENCOUNTER — Ambulatory Visit: Admitting: Family Medicine

## 2024-11-04 ENCOUNTER — Ambulatory Visit: Admitting: Family Medicine

## 2024-11-04 ENCOUNTER — Encounter: Payer: Self-pay | Admitting: Family Medicine

## 2024-11-04 ENCOUNTER — Ambulatory Visit: Payer: Self-pay | Admitting: Family Medicine

## 2024-11-04 VITALS — BP 124/74 | HR 66 | Temp 98.3°F | Ht 60.0 in | Wt 206.2 lb

## 2024-11-04 DIAGNOSIS — M79604 Pain in right leg: Secondary | ICD-10-CM | POA: Insufficient documentation

## 2024-11-04 DIAGNOSIS — R600 Localized edema: Secondary | ICD-10-CM | POA: Insufficient documentation

## 2024-11-04 DIAGNOSIS — I5033 Acute on chronic diastolic (congestive) heart failure: Secondary | ICD-10-CM | POA: Insufficient documentation

## 2024-11-04 DIAGNOSIS — M79605 Pain in left leg: Secondary | ICD-10-CM | POA: Diagnosis not present

## 2024-11-04 LAB — BASIC METABOLIC PANEL WITH GFR
BUN: 19 mg/dL (ref 6–23)
CO2: 32 meq/L (ref 19–32)
Calcium: 9.3 mg/dL (ref 8.4–10.5)
Chloride: 100 meq/L (ref 96–112)
Creatinine, Ser: 0.74 mg/dL (ref 0.40–1.20)
GFR: 82.84 mL/min
Glucose, Bld: 84 mg/dL (ref 70–99)
Potassium: 4.4 meq/L (ref 3.5–5.1)
Sodium: 140 meq/L (ref 135–145)

## 2024-11-04 MED ORDER — SPIRONOLACTONE 25 MG PO TABS: 25.0000 mg | ORAL_TABLET | Freq: Every day | ORAL | 3 refills | Status: AC

## 2024-11-04 MED ORDER — HYDROCODONE-ACETAMINOPHEN 5-325 MG PO TABS: 0.5000 | ORAL_TABLET | Freq: Two times a day (BID) | ORAL | 0 refills | Status: AC | PRN

## 2024-11-04 NOTE — Assessment & Plan Note (Signed)
 Acute worsening of chronic issue seen on recent echocardiogram. Blood pressure well-controlled but patient fluid overloaded significantly.

## 2024-11-04 NOTE — Assessment & Plan Note (Signed)
 Chronic with acute flare.  Minimal response to Lasix  40 mg daily.  Patient unable to wear compression hose but will try again.  She is not really elevating her legs above her heart and we discussed in detail using gravity to assist with fluid redistribution. I wonder if there is possible lymphedema involved and will refer to vascular for venous reflux evaluation and consideration of lymphedema pumps.  Thyroid function, liver and kidney function previously normal.  Most likely cause of fluid overload is grade 2 diastolic dysfunction. I will add spironolactone  as long as basic metabolic panel is unchanged today with recent attempts at diuresis with Lasix  40 mg daily. She does have an appointment upcoming next week with cardiology for additional recommendations.

## 2024-11-04 NOTE — Progress Notes (Signed)
 "   Patient ID: [name], female    DOB: [birth year], 69 y.o.   MRN: [id number]  This visit was conducted in person.  BP 124/74 (BP Location: Right Arm, Patient Position: Sitting, Cuff Size: Normal)   Pulse 66   Temp 98.3 F (36.8 C) (Oral)   Ht 5' (1.524 m)   Wt 206 lb 3.2 oz (93.5 kg)   SpO2 95%   BMI 40.27 kg/m    CC:  Chief Complaint  Patient presents with   Acute Visit    BIL leg, foot swelling, onset 1 month Currently on 40 mg lasix  last 2 ish weeks  Weeping started onset 1 week Pt reports no more urination than usual with increase lasix  dosage   Pt's daughter [name] is in room    Subjective:   HPI: [name] is a 69 y.o. female presenting on 11/04/2024 for Acute Visit (BIL leg, foot swelling, onset 1 month/Currently on 40 mg lasix  last 2 ish weeks//Weeping started onset 1 week/Pt reports no more urination than usual with increase lasix  dosage //Pt's daughter [name] is in room)   Peripheral edea:m minimal improvement with  lasix  40 mg daily... minimal increase in UOP  Cannot tolerate compression hose.  Has noted clear weeping    ECHo 1/2 showed EF 60-65%,    Diastolic dysfunction, grade 2. Mild left ventricular hypertrophy.   Appt with cardiology 11/09/2024  BNP nml 1/22026  Wt Readings from Last 3 Encounters:  11/04/24 206 lb 3.2 oz (93.5 kg)  10/15/24 202 lb (91.6 kg)  10/08/24 191 lb 12.8 oz (87 kg)    Hydrocodone  1/2 -1 tablet.SABRA BID.  Daughter [name] provides history.  Relevant past medical, surgical, family and social history reviewed and updated as indicated. Interim medical history since our last visit reviewed. Allergies and medications reviewed and updated. Outpatient Medications Prior to Visit  Medication Sig Dispense Refill   acetaminophen  (TYLENOL ) 500 MG tablet Take 500 mg by mouth every 6 (six) hours as needed for mild pain (pain score 1-3).     albuterol  (VENTOLIN  HFA) 108 (90 Base) MCG/ACT inhaler Inhale 2 puffs into the lungs every 4  (four) hours as needed for wheezing or shortness of breath. SMARTSIG:2 Puff(s) By Mouth Every 4 Hours 6.7 g 1   amLODipine  (NORVASC ) 5 MG tablet Take 5 mg by mouth daily.     DULoxetine  (CYMBALTA ) 60 MG capsule Take 1 capsule (60 mg total) by mouth daily. 90 capsule 1   furosemide  (LASIX ) 20 MG tablet Take 1 tablet (20 mg total) by mouth daily as needed for edema or fluid. (Patient taking differently: Take 40 mg by mouth daily.) 30 tablet 0   gabapentin  (NEURONTIN ) 300 MG capsule TAKE 1 CAPSULE BY MOUTH TWICE A DAY 60 capsule 3   latanoprost  (XALATAN ) 0.005 % ophthalmic solution Place 1 drop into both eyes at bedtime.     methadone  (DOLOPHINE ) 10 MG/ML solution Take 20 mLs (200 mg total) by mouth in the morning. Home med.     potassium chloride  SA (KLOR-CON  M) 20 MEQ tablet Take 1 tablet (20 mEq total) by mouth daily. Take each time furosemide  taken. 30 tablet 0   rosuvastatin  (CRESTOR ) 10 MG tablet Take 1 tablet (10 mg total) by mouth daily. 90 tablet 3   SYMBICORT 160-4.5 MCG/ACT inhaler 1 puff 2 (two) times daily.     tiZANidine (ZANAFLEX) 2 MG tablet Take 2 mg by mouth at bedtime.     triamcinolone  cream (KENALOG ) 0.5 %  APPLY TO AFFECTED AREA TWICE A DAY 30 g 0   HYDROcodone -acetaminophen  (NORCO/VICODIN) 5-325 MG tablet Take 0.5-1 tablets by mouth every 12 (twelve) hours as needed for severe pain (pain score 7-10). 20 tablet 0   No facility-administered medications prior to visit.     Per HPI unless specifically indicated in ROS section below Review of Systems  Constitutional:  Positive for fatigue. Negative for fever.  HENT:  Negative for congestion.   Eyes:  Negative for pain.  Respiratory:  Negative for cough and shortness of breath.   Cardiovascular:  Positive for leg swelling. Negative for chest pain and palpitations.  Gastrointestinal:  Negative for abdominal pain.  Genitourinary:  Negative for dysuria and vaginal bleeding.  Musculoskeletal:  Negative for back pain.        Bilateral leg pain  Neurological:  Negative for syncope, light-headedness and headaches.  Psychiatric/Behavioral:  Negative for dysphoric mood.    Objective:  BP 124/74 (BP Location: Right Arm, Patient Position: Sitting, Cuff Size: Normal)   Pulse 66   Temp 98.3 F (36.8 C) (Oral)   Ht 5' (1.524 m)   Wt 206 lb 3.2 oz (93.5 kg)   SpO2 95%   BMI 40.27 kg/m   Wt Readings from Last 3 Encounters:  11/04/24 206 lb 3.2 oz (93.5 kg)  10/15/24 202 lb (91.6 kg)  10/08/24 191 lb 12.8 oz (87 kg)      Physical Exam Constitutional:      General: She is not in acute distress.    Appearance: Normal appearance. She is well-developed. She is not ill-appearing or toxic-appearing.  HENT:     Head: Normocephalic.     Right Ear: Hearing, tympanic membrane, ear canal and external ear normal. Tympanic membrane is not erythematous, retracted or bulging.     Left Ear: Hearing, tympanic membrane, ear canal and external ear normal. Tympanic membrane is not erythematous, retracted or bulging.     Nose: No mucosal edema or rhinorrhea.     Right Sinus: No maxillary sinus tenderness or frontal sinus tenderness.     Left Sinus: No maxillary sinus tenderness or frontal sinus tenderness.     Mouth/Throat:     Pharynx: Uvula midline.  Eyes:     General: Lids are normal. Lids are everted, no foreign bodies appreciated.     Conjunctiva/sclera: Conjunctivae normal.     Pupils: Pupils are equal, round, and reactive to light.  Neck:     Thyroid: No thyroid mass or thyromegaly.     Vascular: No carotid bruit.     Trachea: Trachea normal.  Cardiovascular:     Rate and Rhythm: Normal rate and regular rhythm.     Pulses: Normal pulses.     Heart sounds: Normal heart sounds, S1 normal and S2 normal. No murmur heard.    No friction rub. No gallop.     Comments:  No current weeping or ulcers Swelling up to lower calf Dry skin on legs, minimal erythema. Pulmonary:     Effort: Pulmonary effort is normal. No  tachypnea or respiratory distress.     Breath sounds: Normal breath sounds. No decreased breath sounds, wheezing, rhonchi or rales.  Abdominal:     General: Bowel sounds are normal.     Palpations: Abdomen is soft.     Tenderness: There is no abdominal tenderness.  Musculoskeletal:     Cervical back: Normal range of motion and neck supple.     Right lower leg: 3+ Edema present.  Left lower leg: 3+ Edema present.  Skin:    General: Skin is warm and dry.     Findings: No rash.  Neurological:     Mental Status: She is alert.  Psychiatric:        Mood and Affect: Mood is not anxious or depressed.        Speech: Speech normal.        Behavior: Behavior normal. Behavior is cooperative.        Thought Content: Thought content normal.        Judgment: Judgment normal.       Results for orders placed or performed during the hospital encounter of 10/22/24  ECHOCARDIOGRAM COMPLETE   Collection Time: 10/22/24  9:47 AM  Result Value Ref Range   Ao pk vel 1.31 m/s   AV Area VTI 0.86 cm2   AR max vel 1.12 cm2   AV Mean grad 4.0 mmHg   AV Peak grad 6.9 mmHg   S' Lateral 2.55 cm   AV Area mean vel 1.01 cm2   Area-P 1/2 3.24 cm2   Est EF 60 - 65%     Assessment and Plan  Peripheral edema Assessment & Plan: Chronic with acute flare.  Minimal response to Lasix  40 mg daily.  Patient unable to wear compression hose but will try again.  She is not really elevating her legs above her heart and we discussed in detail using gravity to assist with fluid redistribution. I wonder if there is possible lymphedema involved and will refer to vascular for venous reflux evaluation and consideration of lymphedema pumps.  Thyroid function, liver and kidney function previously normal.  Most likely cause of fluid overload is grade 2 diastolic dysfunction. I will add spironolactone  as long as basic metabolic panel is unchanged today with recent attempts at diuresis with Lasix  40 mg daily. She does have an  appointment upcoming next week with cardiology for additional recommendations.  Orders: -     Basic metabolic panel with GFR -     Ambulatory referral to Vascular Surgery  Acute on chronic heart failure with preserved ejection fraction (HFpEF) (HCC) Assessment & Plan: Acute worsening of chronic issue seen on recent echocardiogram. Blood pressure well-controlled but patient fluid overloaded significantly.   Bilateral leg pain Assessment & Plan: Chronic with acute worsening with severe edema. Will continue to manage pain with as needed hydrocodone .  Pain will likely improve once fluid is improved. We did discuss risk of oversedation with this medication and her daughter will monitor closely.   Other orders -     Spironolactone ; Take 1 tablet (25 mg total) by mouth daily.  Dispense: 30 tablet; Refill: 3 -     HYDROcodone -Acetaminophen ; Take 0.5-1 tablets by mouth every 12 (twelve) hours as needed for severe pain (pain score 7-10).  Dispense: 20 tablet; Refill: 0    No follow-ups on file.   Greig Ring, MD  "

## 2024-11-04 NOTE — Assessment & Plan Note (Signed)
 Chronic with acute worsening with severe edema. Will continue to manage pain with as needed hydrocodone .  Pain will likely improve once fluid is improved. We did discuss risk of oversedation with this medication and her daughter will monitor closely.

## 2024-11-09 ENCOUNTER — Other Ambulatory Visit: Payer: Self-pay | Admitting: Family Medicine

## 2024-11-09 NOTE — Telephone Encounter (Signed)
 Pharmacy is requesting 90 day supply.  Okay to fill?

## 2024-11-18 ENCOUNTER — Other Ambulatory Visit (INDEPENDENT_AMBULATORY_CARE_PROVIDER_SITE_OTHER): Payer: Self-pay

## 2024-12-14 ENCOUNTER — Encounter (INDEPENDENT_AMBULATORY_CARE_PROVIDER_SITE_OTHER): Admitting: Ophthalmology
